# Patient Record
Sex: Female | Born: 1985 | Race: Black or African American | Hispanic: No | Marital: Single | Smoking: Former smoker
Health system: Southern US, Community
[De-identification: ages and names within clinical notes are randomized; demographics above are authoritative.]

## PROBLEM LIST (undated history)

## (undated) ENCOUNTER — Inpatient Hospital Stay: Payer: Self-pay

## (undated) DIAGNOSIS — N760 Acute vaginitis: Secondary | ICD-10-CM

## (undated) DIAGNOSIS — B9689 Other specified bacterial agents as the cause of diseases classified elsewhere: Secondary | ICD-10-CM

## (undated) DIAGNOSIS — I10 Essential (primary) hypertension: Secondary | ICD-10-CM

## (undated) DIAGNOSIS — N83209 Unspecified ovarian cyst, unspecified side: Secondary | ICD-10-CM

## (undated) DIAGNOSIS — F32A Depression, unspecified: Secondary | ICD-10-CM

## (undated) DIAGNOSIS — D649 Anemia, unspecified: Secondary | ICD-10-CM

## (undated) DIAGNOSIS — F419 Anxiety disorder, unspecified: Secondary | ICD-10-CM

## (undated) DIAGNOSIS — F329 Major depressive disorder, single episode, unspecified: Secondary | ICD-10-CM

## (undated) DIAGNOSIS — O009 Unspecified ectopic pregnancy without intrauterine pregnancy: Secondary | ICD-10-CM

## (undated) DIAGNOSIS — Z87440 Personal history of urinary (tract) infections: Secondary | ICD-10-CM

## (undated) HISTORY — DX: Personal history of urinary (tract) infections: Z87.440

## (undated) HISTORY — DX: Unspecified ectopic pregnancy without intrauterine pregnancy: O00.90

## (undated) HISTORY — PX: DILATION AND CURETTAGE OF UTERUS: SHX78

## (undated) HISTORY — DX: Anemia, unspecified: D64.9

---
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
## 2003-09-22 ENCOUNTER — Emergency Department (HOSPITAL_COMMUNITY): Admission: EM | Admit: 2003-09-22 | Discharge: 2003-09-22 | Payer: Self-pay | Admitting: *Deleted

## 2004-02-01 ENCOUNTER — Emergency Department (HOSPITAL_COMMUNITY): Admission: EM | Admit: 2004-02-01 | Discharge: 2004-02-01 | Payer: Self-pay | Admitting: Emergency Medicine

## 2004-06-06 ENCOUNTER — Inpatient Hospital Stay (HOSPITAL_COMMUNITY): Admission: AD | Admit: 2004-06-06 | Discharge: 2004-06-06 | Payer: Self-pay | Admitting: Family Medicine

## 2004-06-08 ENCOUNTER — Encounter (INDEPENDENT_AMBULATORY_CARE_PROVIDER_SITE_OTHER): Payer: Self-pay | Admitting: *Deleted

## 2004-06-08 ENCOUNTER — Ambulatory Visit (HOSPITAL_COMMUNITY): Admission: AD | Admit: 2004-06-08 | Discharge: 2004-06-08 | Payer: Self-pay | Admitting: Obstetrics and Gynecology

## 2004-07-23 ENCOUNTER — Emergency Department (HOSPITAL_COMMUNITY): Admission: EM | Admit: 2004-07-23 | Discharge: 2004-07-23 | Payer: Self-pay | Admitting: Emergency Medicine

## 2004-09-30 ENCOUNTER — Emergency Department (HOSPITAL_COMMUNITY): Admission: EM | Admit: 2004-09-30 | Discharge: 2004-09-30 | Payer: Self-pay | Admitting: Emergency Medicine

## 2004-10-23 ENCOUNTER — Emergency Department (HOSPITAL_COMMUNITY): Admission: EM | Admit: 2004-10-23 | Discharge: 2004-10-23 | Payer: Self-pay | Admitting: *Deleted

## 2004-10-23 ENCOUNTER — Emergency Department: Payer: Self-pay | Admitting: Emergency Medicine

## 2004-10-24 ENCOUNTER — Ambulatory Visit: Payer: Self-pay | Admitting: Emergency Medicine

## 2004-11-28 ENCOUNTER — Inpatient Hospital Stay (HOSPITAL_COMMUNITY): Admission: AD | Admit: 2004-11-28 | Discharge: 2004-11-28 | Payer: Self-pay | Admitting: Obstetrics and Gynecology

## 2004-12-17 ENCOUNTER — Ambulatory Visit (HOSPITAL_COMMUNITY): Admission: RE | Admit: 2004-12-17 | Discharge: 2004-12-17 | Payer: Self-pay | Admitting: *Deleted

## 2005-01-04 ENCOUNTER — Inpatient Hospital Stay (HOSPITAL_COMMUNITY): Admission: AD | Admit: 2005-01-04 | Discharge: 2005-01-04 | Payer: Self-pay | Admitting: *Deleted

## 2005-02-12 ENCOUNTER — Inpatient Hospital Stay (HOSPITAL_COMMUNITY): Admission: AD | Admit: 2005-02-12 | Discharge: 2005-02-12 | Payer: Self-pay | Admitting: *Deleted

## 2005-04-10 ENCOUNTER — Inpatient Hospital Stay (HOSPITAL_COMMUNITY): Admission: AD | Admit: 2005-04-10 | Discharge: 2005-04-11 | Payer: Self-pay | Admitting: *Deleted

## 2005-04-10 ENCOUNTER — Ambulatory Visit: Payer: Self-pay | Admitting: Family Medicine

## 2005-04-13 ENCOUNTER — Inpatient Hospital Stay (HOSPITAL_COMMUNITY): Admission: AD | Admit: 2005-04-13 | Discharge: 2005-04-13 | Payer: Self-pay | Admitting: *Deleted

## 2005-04-13 ENCOUNTER — Ambulatory Visit: Payer: Self-pay | Admitting: Family Medicine

## 2005-04-20 ENCOUNTER — Ambulatory Visit: Payer: Self-pay | Admitting: Obstetrics & Gynecology

## 2005-04-21 ENCOUNTER — Ambulatory Visit: Payer: Self-pay | Admitting: Obstetrics and Gynecology

## 2005-04-22 ENCOUNTER — Inpatient Hospital Stay (HOSPITAL_COMMUNITY): Admission: AD | Admit: 2005-04-22 | Discharge: 2005-04-22 | Payer: Self-pay | Admitting: Gynecology

## 2005-04-22 ENCOUNTER — Ambulatory Visit: Payer: Self-pay | Admitting: Obstetrics and Gynecology

## 2005-04-30 ENCOUNTER — Ambulatory Visit: Payer: Self-pay | Admitting: *Deleted

## 2005-05-01 ENCOUNTER — Ambulatory Visit (HOSPITAL_COMMUNITY): Admission: RE | Admit: 2005-05-01 | Discharge: 2005-05-01 | Payer: Self-pay | Admitting: Gynecology

## 2005-05-02 ENCOUNTER — Ambulatory Visit: Payer: Self-pay | Admitting: Certified Nurse Midwife

## 2005-05-02 ENCOUNTER — Inpatient Hospital Stay (HOSPITAL_COMMUNITY): Admission: AD | Admit: 2005-05-02 | Discharge: 2005-05-02 | Payer: Self-pay | Admitting: Obstetrics and Gynecology

## 2005-05-04 ENCOUNTER — Inpatient Hospital Stay (HOSPITAL_COMMUNITY): Admission: AD | Admit: 2005-05-04 | Discharge: 2005-05-07 | Payer: Self-pay | Admitting: *Deleted

## 2005-05-04 ENCOUNTER — Ambulatory Visit: Payer: Self-pay | Admitting: Certified Nurse Midwife

## 2009-01-02 ENCOUNTER — Inpatient Hospital Stay (HOSPITAL_COMMUNITY): Admission: AD | Admit: 2009-01-02 | Discharge: 2009-01-02 | Payer: Self-pay | Admitting: Obstetrics and Gynecology

## 2009-04-11 ENCOUNTER — Encounter: Payer: Self-pay | Admitting: Obstetrics and Gynecology

## 2009-04-30 ENCOUNTER — Encounter: Payer: Self-pay | Admitting: Pediatric Cardiology

## 2009-06-24 ENCOUNTER — Observation Stay: Payer: Self-pay

## 2009-07-16 ENCOUNTER — Observation Stay: Payer: Self-pay | Admitting: Obstetrics and Gynecology

## 2009-08-03 ENCOUNTER — Inpatient Hospital Stay (HOSPITAL_COMMUNITY): Admission: AD | Admit: 2009-08-03 | Discharge: 2009-08-03 | Payer: Self-pay | Admitting: Family Medicine

## 2009-08-03 ENCOUNTER — Ambulatory Visit: Payer: Self-pay | Admitting: Advanced Practice Midwife

## 2009-08-05 ENCOUNTER — Inpatient Hospital Stay (HOSPITAL_COMMUNITY): Admission: AD | Admit: 2009-08-05 | Discharge: 2009-08-07 | Payer: Self-pay | Admitting: Obstetrics and Gynecology

## 2009-08-05 ENCOUNTER — Ambulatory Visit: Payer: Self-pay | Admitting: Advanced Practice Midwife

## 2010-02-09 ENCOUNTER — Encounter: Payer: Self-pay | Admitting: Family Medicine

## 2010-04-05 LAB — RAPID URINE DRUG SCREEN, HOSP PERFORMED
Amphetamines: NOT DETECTED
Barbiturates: NOT DETECTED
Benzodiazepines: NOT DETECTED
Cocaine: NOT DETECTED
Opiates: NOT DETECTED
Tetrahydrocannabinol: NOT DETECTED

## 2010-04-05 LAB — RUBELLA SCREEN: Rubella: 49.5 IU/mL — ABNORMAL HIGH

## 2010-04-05 LAB — CBC
HCT: 26.8 % — ABNORMAL LOW (ref 36.0–46.0)
HCT: 33 % — ABNORMAL LOW (ref 36.0–46.0)
Hemoglobin: 11.4 g/dL — ABNORMAL LOW (ref 12.0–15.0)
Hemoglobin: 9.2 g/dL — ABNORMAL LOW (ref 12.0–15.0)
MCH: 32.5 pg (ref 26.0–34.0)
MCH: 32.7 pg (ref 26.0–34.0)
MCHC: 34.4 g/dL (ref 30.0–36.0)
MCHC: 34.4 g/dL (ref 30.0–36.0)
MCV: 94.7 fL (ref 78.0–100.0)
MCV: 95.2 fL (ref 78.0–100.0)
Platelets: 186 10*3/uL (ref 150–400)
Platelets: 213 10*3/uL (ref 150–400)
RBC: 2.82 MIL/uL — ABNORMAL LOW (ref 3.87–5.11)
RBC: 3.49 MIL/uL — ABNORMAL LOW (ref 3.87–5.11)
RDW: 13.6 % (ref 11.5–15.5)
RDW: 13.9 % (ref 11.5–15.5)
WBC: 14.7 10*3/uL — ABNORMAL HIGH (ref 4.0–10.5)
WBC: 15.6 10*3/uL — ABNORMAL HIGH (ref 4.0–10.5)

## 2010-04-05 LAB — URINALYSIS, ROUTINE W REFLEX MICROSCOPIC
Bilirubin Urine: NEGATIVE
Glucose, UA: NEGATIVE mg/dL
Hgb urine dipstick: NEGATIVE
Ketones, ur: NEGATIVE mg/dL
Nitrite: NEGATIVE
Protein, ur: NEGATIVE mg/dL
Specific Gravity, Urine: 1.015 (ref 1.005–1.030)
Urobilinogen, UA: 0.2 mg/dL (ref 0.0–1.0)
pH: 7.5 (ref 5.0–8.0)

## 2010-04-05 LAB — DIFFERENTIAL
Basophils Absolute: 0 10*3/uL (ref 0.0–0.1)
Basophils Relative: 0 % (ref 0–1)
Eosinophils Absolute: 0.1 10*3/uL (ref 0.0–0.7)
Eosinophils Relative: 1 % (ref 0–5)
Lymphocytes Relative: 11 % — ABNORMAL LOW (ref 12–46)
Lymphs Abs: 1.8 10*3/uL (ref 0.7–4.0)
Monocytes Absolute: 0.7 10*3/uL (ref 0.1–1.0)
Monocytes Relative: 4 % (ref 3–12)
Neutro Abs: 13 10*3/uL — ABNORMAL HIGH (ref 1.7–7.7)
Neutrophils Relative %: 83 % — ABNORMAL HIGH (ref 43–77)

## 2010-04-05 LAB — HEPATITIS B SURFACE ANTIGEN: Hepatitis B Surface Ag: NEGATIVE

## 2010-04-05 LAB — RPR: RPR Ser Ql: NONREACTIVE

## 2010-04-05 LAB — RAPID HIV SCREEN (WH-MAU): Rapid HIV Screen: NONREACTIVE

## 2010-04-22 LAB — GC/CHLAMYDIA PROBE AMP, GENITAL
Chlamydia, DNA Probe: NEGATIVE
GC Probe Amp, Genital: NEGATIVE

## 2010-04-22 LAB — CBC
HCT: 34.1 % — ABNORMAL LOW (ref 36.0–46.0)
Hemoglobin: 11.5 g/dL — ABNORMAL LOW (ref 12.0–15.0)
MCHC: 33.8 g/dL (ref 30.0–36.0)
MCV: 92.4 fL (ref 78.0–100.0)
Platelets: 275 10*3/uL (ref 150–400)
RBC: 3.69 MIL/uL — ABNORMAL LOW (ref 3.87–5.11)
RDW: 12.7 % (ref 11.5–15.5)
WBC: 14.3 10*3/uL — ABNORMAL HIGH (ref 4.0–10.5)

## 2010-04-22 LAB — URINALYSIS, ROUTINE W REFLEX MICROSCOPIC
Bilirubin Urine: NEGATIVE
Glucose, UA: NEGATIVE mg/dL
Hgb urine dipstick: NEGATIVE
Ketones, ur: NEGATIVE mg/dL
Nitrite: NEGATIVE
Protein, ur: NEGATIVE mg/dL
Specific Gravity, Urine: 1.025 (ref 1.005–1.030)
Urobilinogen, UA: 0.2 mg/dL (ref 0.0–1.0)
pH: 6.5 (ref 5.0–8.0)

## 2010-04-22 LAB — WET PREP, GENITAL
Trich, Wet Prep: NONE SEEN
Yeast Wet Prep HPF POC: NONE SEEN

## 2010-04-22 LAB — POCT PREGNANCY, URINE: Preg Test, Ur: POSITIVE

## 2010-04-22 LAB — ABO/RH: ABO/RH(D): O POS

## 2010-06-06 NOTE — Discharge Summary (Signed)
NAME:  Diamond Sutton, Diamond Sutton NO.:  [id number]   MEDICAL RECORD NO.:  [id number]          PATIENT TYPE:  INP   LOCATION:  9172                          [facility]ILITY:  [facility]   PHYSICIAN:  Lesly Dukes, M.D. DATE OF BIRTH:  1985/02/27   DATE OF ADMISSION:  04/10/2005  DATE OF DISCHARGE:  04/11/2005                                 DISCHARGE SUMMARY   ADMITTING DIAGNOSES:  1.  Preterm cervical change.  2.  32 5/7 week intrauterine pregnancy.   DISCHARGE DIAGNOSES:  1.  Preterm cervical change.  2.  32 5/7 week intrauterine pregnancy.   ADMITTING HISTORY AND PHYSICAL:  The patient is 25 year old G3, P1, 0-1-1 at  8 5/7, who states that she is having very strong pain in her low stomach.  She states that she is having contractions every 5-10 minutes.   OBSTETRIC HISTORY:  1.  G1 2004, 6 pounds 13 ounces, __________.  2.  G2 2006, 12 weeks spontaenous AB, D&C.   GYNECOLOGIC HISTORY:  1.  Abnormal Pap, LG SIL, colposcopy, CIN I.  2.  Right ovarian cyst.   MEDICAL HISTORY:  None.   SURGICAL HISTORY:  D&C.   SOCIAL HISTORY:  She is single.   PRENATAL LABORATORY:  O positive, antibody negative.  Hemoglobin 11.5,  platelets 310, rubella immune.  Hepatitis B negative.  Syphilis negative.  HIV negative.  GC/Chlamydia negative.  GBS negative on February 20, 2005.  One hour Glucola 132.   PHYSICAL EXAMINATION:  GENERAL:  Afebrile, vital signs stable.  Well-  developed, well-nourished female, in no acute distress.  HEART:  Regular rhythm and rate, no murmurs, gallops or rubs.  CHEST:  Clear to auscultation.  ABDOMEN:  Gravid.  PELVIC:  Digital cervical exam by Dr. Gavin Potters showed fingertip, soft, thin,  lower uterine segment.   HOSPITAL COURSE:  The patient was admitted.  She was given betamethasone x2.  She remained stable without contractions.  She did state that she had  pressure.  She was reexamined on the day of admission and found to be  unchanged and found to be  cephalic. On admission, she was transverse and  cephalic on the day of discharge was confirmed by an informal ultrasound.   SIGNIFICANT LABORATORY:  EUA negative.  Fetal fibronectin negative.  GC/chlamydia negative.  GBS pending.  However, the patient had a negative  result on February 20, 2005.   Ultrasound showed an estimated gestational age of 3 2/7 weeks, just over  the 95th percentile for growth.  Normal AFI.  Cervix was 2.6 cm in length.   DISCHARGE INSTRUCTIONS:  The patient was discharged to home in stable  condition.  She was to follow up at high risk clinic.  She will be called  with an appointment.  Her medications included Procardia XL 30 mg 1 p.o.  b.i.d.  She was instructed to stay on bedrest with bathroom privileges.     ______________________________  August Saucer Merlene Morse, MD    ______________________________  Lesly Dukes, M.D.    ABC/MEDQ  D:  04/11/2005  T:  04/13/2005  Job:  [id number]

## 2010-06-06 NOTE — Op Note (Signed)
NAME:  Diamond Sutton, Diamond Sutton NO.:  [id number]   MEDICAL RECORD NO.:  [id number]          PATIENT TYPE:  MAT   LOCATION:  MATC                          [facility]  [facility]   PHYSICIAN:  Malva Limes, M.D.    DATE OF BIRTH:  [birth year]   DATE OF PROCEDURE:  06/08/2004  DATE OF DISCHARGE:                                 OPERATIVE REPORT   PREOPERATIVE DIAGNOSIS:  Spontaneous abortion.   POSTOPERATIVE DIAGNOSIS:  Spontaneous abortion.   PROCEDURE:  Dilatation and curettage.   SURGEON:  Malva Limes, M.D.   ANESTHESIA:  MAC with paracervical block.   ESTIMATED BLOOD LOSS:  Minimal.   COMPLICATIONS:  None.   SPECIMENS:  Products of conception sent to pathology.   DRAINS:  None.   ANTIBIOTICS:  Ancef 1 gram.   DESCRIPTION OF PROCEDURE:  The patient was taken to the operating room where  she was placed in the dorsal lithotomy position.  The MAC anesthesia was  administered without complications.  Betadine was used for prep.  She was  draped in the usual fashion for the procedure.  Examination revealed an  anteverted uterus consistent with 7 weeks.  Sterile speculum was placed in  the vagina.  10 mL of 1% lidocaine was used for a paracervical block.  A  single tooth tenaculum was applied to the anterior cervical lip.  The  cervical os was dilated to a 27 [place].  The 7 mm suction cannula was placed  into the uterine cavity.  Suction curettage was performed followed by sharp  curettage.  A repeat suction was performed after this.  The patient  tolerated the procedure well.  She was discharged to home.  She was  instructed to follow up in the office in 4 weeks.  She was sent home with  Darvocet to take p.r.n. and Keflex 500 mg q.i.d. for 2 days.  The patient  will get RhoGAM if Rh negative.  This case was done as a good Environmental consultant.  She will not be getting a charge from my office as I do not accept Medicaid.      MA/MEDQ  D:  06/08/2004  T:  06/09/2004  Job:  [id number]

## 2010-06-06 NOTE — Discharge Summary (Signed)
NAME:  Diamond Sutton, Diamond Sutton NO.:  [id number]   MEDICAL RECORD NO.:  [id number]          PATIENT TYPE:  INP   LOCATION:                                [facility]   PHYSICIAN:  Angie B. Merlene Morse, MD  DATE OF BIRTH:  1985-05-17   DATE OF ADMISSION:  04/10/2005  DATE OF DISCHARGE:  04/11/2005                                 DISCHARGE SUMMARY   ADDENDUM:  This is an addendum to discharge summary [phone].   ADDITIONAL DISCHARGE DIAGNOSES:  1.  Large for gestational age fetus.  2.  Elevated random blood sugar.   ADDITION TO HOSPITAL COURSE:  The patient was found to have a random blood  sugar of 154.  She states that she had just drank ginger ale.   ADDITION TO DISCHARGE PLAN:  The patient is to followup, was given  instructions on diabetic diet, told to follow up at high risk clinic and  will have an appointment scheduled in the high risk clinic on a Monday when  she can see the diabetic educator.  The patient will need a followup  ultrasound in 3 weeks to evaluate the growth.           ______________________________  [name] B. Merlene Morse, MD     ABC/MEDQ  D:  04/11/2005  T:  04/13/2005  Job:  [id number]

## 2011-08-01 ENCOUNTER — Emergency Department: Payer: Self-pay | Admitting: Unknown Physician Specialty

## 2011-08-01 LAB — COMPREHENSIVE METABOLIC PANEL
Alkaline Phosphatase: 61 U/L (ref 50–136)
Anion Gap: 7 (ref 7–16)
BUN: 15 mg/dL (ref 7–18)
Bilirubin,Total: 0.2 mg/dL (ref 0.2–1.0)
Chloride: 105 mmol/L (ref 98–107)
Co2: 28 mmol/L (ref 21–32)
Creatinine: 0.78 mg/dL (ref 0.60–1.30)
EGFR (African American): >60
EGFR (Non-African Amer.): >60
Glucose: 108 mg/dL — ABNORMAL HIGH (ref 65–99)
Osmolality: 281 (ref 275–301)
Potassium: 3.3 mmol/L — ABNORMAL LOW (ref 3.5–5.1)
SGPT (ALT): 25 U/L
Total Protein: 7.4 g/dL (ref 6.4–8.2)

## 2011-08-01 LAB — CBC
HCT: 36.4 % (ref 35.0–47.0)
HGB: 12 g/dL (ref 12.0–16.0)
MCH: 30.5 pg (ref 26.0–34.0)
MCHC: 33 g/dL (ref 32.0–36.0)
MCV: 92 fL (ref 80–100)
RDW: 13.4 % (ref 11.5–14.5)

## 2011-08-02 LAB — TROPONIN I: Troponin-I: 0.03 ng/mL

## 2011-08-02 LAB — CK TOTAL AND CKMB (NOT AT ARMC): CK-MB: 1.4 ng/mL (ref 0.5–3.6)

## 2011-09-03 ENCOUNTER — Emergency Department: Payer: Self-pay | Admitting: *Deleted

## 2011-12-18 ENCOUNTER — Emergency Department: Payer: Self-pay

## 2011-12-18 LAB — COMPREHENSIVE METABOLIC PANEL
Alkaline Phosphatase: 71 U/L (ref 50–136)
Anion Gap: 5 — ABNORMAL LOW (ref 7–16)
Bilirubin,Total: 0.4 mg/dL (ref 0.2–1.0)
Chloride: 108 mmol/L — ABNORMAL HIGH (ref 98–107)
Co2: 27 mmol/L (ref 21–32)
Creatinine: 0.62 mg/dL (ref 0.60–1.30)
EGFR (Non-African Amer.): >60
Osmolality: 278 (ref 275–301)
Potassium: 3.6 mmol/L (ref 3.5–5.1)
Sodium: 140 mmol/L (ref 136–145)

## 2011-12-18 LAB — URINALYSIS, COMPLETE
Bilirubin,UR: NEGATIVE
Ketone: NEGATIVE
Ph: 5 (ref 4.5–8.0)
Specific Gravity: 1.025 (ref 1.003–1.030)
Squamous Epithelial: 9

## 2011-12-18 LAB — CBC
MCHC: 33.1 g/dL (ref 32.0–36.0)
MCV: 92 fL (ref 80–100)
RDW: 13.7 % (ref 11.5–14.5)

## 2011-12-28 ENCOUNTER — Emergency Department: Payer: Self-pay | Admitting: Emergency Medicine

## 2011-12-28 LAB — URINALYSIS, COMPLETE
Bilirubin,UR: NEGATIVE
Nitrite: POSITIVE
Ph: 6 (ref 4.5–8.0)
Protein: 100
RBC,UR: 18 /HPF (ref 0–5)
Squamous Epithelial: 14

## 2011-12-28 LAB — COMPREHENSIVE METABOLIC PANEL
Alkaline Phosphatase: 78 U/L (ref 50–136)
Anion Gap: 7 (ref 7–16)
Bilirubin,Total: 0.5 mg/dL (ref 0.2–1.0)
EGFR (Non-African Amer.): >60
SGOT(AST): 64 U/L — ABNORMAL HIGH (ref 15–37)
SGPT (ALT): 81 U/L — ABNORMAL HIGH (ref 12–78)
Sodium: 139 mmol/L (ref 136–145)
Total Protein: 7.4 g/dL (ref 6.4–8.2)

## 2011-12-28 LAB — CBC
HCT: 36 % (ref 35.0–47.0)
HGB: 11.9 g/dL — ABNORMAL LOW (ref 12.0–16.0)
MCH: 30.6 pg (ref 26.0–34.0)
RBC: 3.9 10*6/uL (ref 3.80–5.20)
WBC: 15.2 10*3/uL — ABNORMAL HIGH (ref 3.6–11.0)

## 2012-02-19 ENCOUNTER — Emergency Department: Payer: Self-pay | Admitting: Emergency Medicine

## 2012-02-19 LAB — CBC
MCH: 29.7 pg (ref 26.0–34.0)
MCHC: 32.7 g/dL (ref 32.0–36.0)
MCV: 91 fL (ref 80–100)
Platelet: 312 10*3/uL (ref 150–440)
RBC: 4.14 10*6/uL (ref 3.80–5.20)
WBC: 14.6 10*3/uL — ABNORMAL HIGH (ref 3.6–11.0)

## 2012-02-19 LAB — URINALYSIS, COMPLETE
Bilirubin,UR: NEGATIVE
Glucose,UR: NEGATIVE mg/dL (ref 0–75)
Ketone: NEGATIVE
Leukocyte Esterase: NEGATIVE
Ph: 6 (ref 4.5–8.0)
Protein: NEGATIVE
RBC,UR: <1 /HPF (ref 0–5)
Specific Gravity: 1.016 (ref 1.003–1.030)
WBC UR: 2 /HPF (ref 0–5)

## 2012-02-19 LAB — HCG, QUANTITATIVE, PREGNANCY: Beta Hcg, Quant.: 75030 m[IU]/mL — ABNORMAL HIGH

## 2012-04-06 ENCOUNTER — Emergency Department: Payer: Self-pay | Admitting: Emergency Medicine

## 2012-04-06 LAB — URINALYSIS, COMPLETE
Bilirubin,UR: NEGATIVE
Blood: NEGATIVE
Glucose,UR: NEGATIVE mg/dL (ref 0–75)
Ketone: NEGATIVE
Leukocyte Esterase: NEGATIVE
Nitrite: NEGATIVE
Squamous Epithelial: 13
WBC UR: 1 /HPF (ref 0–5)

## 2012-04-06 LAB — CBC
HCT: 36.5 % (ref 35.0–47.0)
HGB: 12.4 g/dL (ref 12.0–16.0)
MCH: 30.5 pg (ref 26.0–34.0)
MCHC: 34.1 g/dL (ref 32.0–36.0)
MCV: 89 fL (ref 80–100)
Platelet: 267 10*3/uL (ref 150–440)
RBC: 4.08 10*6/uL (ref 3.80–5.20)
RDW: 13.4 % (ref 11.5–14.5)
WBC: 14.8 10*3/uL — ABNORMAL HIGH (ref 3.6–11.0)

## 2012-04-06 LAB — COMPREHENSIVE METABOLIC PANEL
Alkaline Phosphatase: 71 U/L (ref 50–136)
Calcium, Total: 8.4 mg/dL — ABNORMAL LOW (ref 8.5–10.1)
Chloride: 107 mmol/L (ref 98–107)
Co2: 25 mmol/L (ref 21–32)
EGFR (African American): >60
Glucose: 104 mg/dL — ABNORMAL HIGH (ref 65–99)
Sodium: 139 mmol/L (ref 136–145)
Total Protein: 7.6 g/dL (ref 6.4–8.2)

## 2012-06-28 ENCOUNTER — Observation Stay: Payer: Self-pay | Admitting: Obstetrics and Gynecology

## 2012-07-23 ENCOUNTER — Observation Stay: Payer: Self-pay | Admitting: Obstetrics and Gynecology

## 2012-07-23 LAB — URINALYSIS, COMPLETE
Blood: NEGATIVE
Glucose,UR: NEGATIVE mg/dL (ref 0–75)
Nitrite: NEGATIVE
Ph: 6 (ref 4.5–8.0)
WBC UR: 4 /HPF (ref 0–5)

## 2012-09-15 ENCOUNTER — Inpatient Hospital Stay: Payer: Self-pay | Admitting: Obstetrics and Gynecology

## 2012-09-15 LAB — PIH PROFILE
Anion Gap: 7 (ref 7–16)
Chloride: 110 mmol/L — ABNORMAL HIGH (ref 98–107)
Co2: 22 mmol/L (ref 21–32)
Creatinine: 0.59 mg/dL — ABNORMAL LOW (ref 0.60–1.30)
EGFR (Non-African Amer.): >60
Glucose: 96 mg/dL (ref 65–99)
HGB: 11.3 g/dL — ABNORMAL LOW (ref 12.0–16.0)
MCH: 30.5 pg (ref 26.0–34.0)
Osmolality: 275 (ref 275–301)
Platelet: 192 10*3/uL (ref 150–440)
RBC: 3.7 10*6/uL — ABNORMAL LOW (ref 3.80–5.20)
SGOT(AST): 20 U/L (ref 15–37)
Sodium: 139 mmol/L (ref 136–145)
WBC: 12.2 10*3/uL — ABNORMAL HIGH (ref 3.6–11.0)

## 2012-09-15 LAB — PROTEIN / CREATININE RATIO, URINE
Creatinine, Urine: 30.8 mg/dL (ref 30.0–125.0)
Protein, Random Urine: 16 mg/dL — ABNORMAL HIGH (ref 0–12)
Protein/Creat. Ratio: 519 mg/gCREAT — ABNORMAL HIGH (ref 0–200)

## 2012-09-16 LAB — HEMATOCRIT: HCT: 29.9 % — ABNORMAL LOW (ref 35.0–47.0)

## 2013-03-27 ENCOUNTER — Emergency Department: Payer: Self-pay | Admitting: Emergency Medicine

## 2013-03-27 LAB — URINALYSIS, COMPLETE
Bilirubin,UR: NEGATIVE
Blood: NEGATIVE
Glucose,UR: NEGATIVE mg/dL (ref 0–75)
Ketone: NEGATIVE
Leukocyte Esterase: NEGATIVE
NITRITE: NEGATIVE
PROTEIN: NEGATIVE
Ph: 6 (ref 4.5–8.0)
SPECIFIC GRAVITY: 1.01 (ref 1.003–1.030)

## 2013-03-27 LAB — COMPREHENSIVE METABOLIC PANEL
ALBUMIN: 4.2 g/dL (ref 3.4–5.0)
ALK PHOS: 82 U/L
Anion Gap: 7 (ref 7–16)
BUN: 9 mg/dL (ref 7–18)
Bilirubin,Total: 0.7 mg/dL (ref 0.2–1.0)
Calcium, Total: 9.2 mg/dL (ref 8.5–10.1)
Chloride: 104 mmol/L (ref 98–107)
Co2: 27 mmol/L (ref 21–32)
Creatinine: 0.73 mg/dL (ref 0.60–1.30)
EGFR (Non-African Amer.): >60
Glucose: 92 mg/dL (ref 65–99)
Osmolality: 274 (ref 275–301)
Potassium: 3.6 mmol/L (ref 3.5–5.1)
SGOT(AST): 29 U/L (ref 15–37)
SGPT (ALT): 26 U/L (ref 12–78)
Sodium: 138 mmol/L (ref 136–145)
Total Protein: 8 g/dL (ref 6.4–8.2)

## 2013-03-27 LAB — CBC WITH DIFFERENTIAL/PLATELET
BASOS PCT: 0.8 %
Basophil #: 0.1 10*3/uL (ref 0.0–0.1)
EOS ABS: 0.2 10*3/uL (ref 0.0–0.7)
EOS PCT: 2.2 %
HCT: 38.8 % (ref 35.0–47.0)
HGB: 13.3 g/dL (ref 12.0–16.0)
Lymphocyte #: 2 10*3/uL (ref 1.0–3.6)
Lymphocyte %: 27.7 %
MCH: 30.7 pg (ref 26.0–34.0)
MCHC: 34.3 g/dL (ref 32.0–36.0)
MCV: 90 fL (ref 80–100)
MONO ABS: 0.7 x10 3/mm (ref 0.2–0.9)
Monocyte %: 9.5 %
NEUTROS PCT: 59.8 %
Neutrophil #: 4.4 10*3/uL (ref 1.4–6.5)
Platelet: 294 10*3/uL (ref 150–440)
RBC: 4.34 10*6/uL (ref 3.80–5.20)
RDW: 13.6 % (ref 11.5–14.5)
WBC: 7.4 10*3/uL (ref 3.6–11.0)

## 2013-03-27 LAB — PREGNANCY, URINE: Pregnancy Test, Urine: NEGATIVE m[IU]/mL

## 2013-05-17 LAB — HM PAP SMEAR: HM Pap smear: NORMAL

## 2013-08-27 ENCOUNTER — Emergency Department: Payer: Self-pay | Admitting: Emergency Medicine

## 2014-01-19 NOTE — L&D Delivery Note (Signed)
Obstetrical Delivery Note   Date of Delivery:   12/30/2014 Primary OB:   [facility] Gestational Age/EDD: 5133w1d Antepartum complications: Preterm labor  Delivered By:   Cornelia Copaharlie Bernita Beckstrom, Jr MD  Delivery Type:   spontaneous vaginal delivery. Patient received one dose of ancef and betamethasone prior to delivery Delivery Details:   Patient easily delivered direct OA infant. Delayed cord clamping and cut by father.  Anesthesia:    epidural Intrapartum complications: None GBS:    Positive only one dose of ancef able to be given Laceration:    none Episiotomy:    none Placenta:    Delivered and expressed via active management. Intact: yes. To pathology: yes.  Estimated Blood Loss:  250mL  Baby:    Liveborn female, APGARs 8/9, weight 2740gm  Cornelia Copaharlie Rockwell Zentz, Jr. MD [facility] Pager [phone]

## 2014-03-26 ENCOUNTER — Emergency Department: Payer: Self-pay | Admitting: Emergency Medicine

## 2014-05-29 NOTE — H&P (Signed)
L&D Evaluation:  History Expanded:  HPI 29 yo G5P4004, EDD of 10/03/12 per LMP & 10 wk US, presents at 37 3/7 weeks with c/o SROM at 12:15 this am and regular ctx. Reports fluid was green colored.  [facility] to [facility], h/o HTN without meds, elevated 1 hr with normal 3 hr GTT   Gravida 5   Term 4   PreTerm 0   Abortion 0   Living 4   Blood Type (Maternal) O positive   Group B Strep Results Maternal (Result >5wks must be treated as unknown) negative   Maternal HIV Negative   Maternal Syphilis Ab Nonreactive   Maternal Varicella Immune   Rubella Results (Maternal) immune   Maternal T-Dap Unknown   [facility]   Presents with contractions, leaking fluid   Patient's Medical History Hypertension   Patient's Surgical History none   Medications Pre Natal Vitamins   Allergies PCN   Social History none   Family History Non-Contributory   ROS:  ROS All systems were reviewed.  HEENT, CNS, GI, GU, Respiratory, CV, Renal and Musculoskeletal systems were found to be normal.   Exam:  Vital Signs BP >140/90   Urine Protein not completed   General no apparent distress   Mental Status clear   Chest clear   Heart normal sinus rhythm   Abdomen gravid, tender with contractions   Estimated Fetal Weight Average for gestational age   Back no CVAT   Edema no edema   Reflexes 1+   Pelvic no external lesions, 3/100/-1   Mebranes Ruptured   Description green/meconium   FHT normal rate with no decels, minimal variability after stadol   Ucx regular   Skin dry   Lymph no lymphadenopathy   Impression:  Impression SROM, labor   Plan:  Plan PIH panel   Comments Requesting epidural Reviewed plan of care with meconium    Electronic Signatures: Vella KohlerBrothers, Darryle Dennie K (CNM)  (Signed 28-Aug-14 06:43)  Authored: L&D Evaluation   Last Updated: 28-Aug-14 06:43 by Vella KohlerBrothers, Latandra Loureiro K (CNM)

## 2014-05-29 NOTE — H&P (Signed)
L&D Evaluation:  History Expanded:  HPI 29 yo G5P4004 azt 29 weeks of pregnancy who presents to l and d with vaginal beeding a few days ago and then it stopped and then started again last night. she put a pad on it a when she woke the blood stain was the sixe of a piece of gum. she has had a litlebit of cramoing, but no other illnesses or   Gravida 5   Term 4   PreTerm 0   Abortion 0   Living 4   Group B Strep Results Maternal (Result >5wks must be treated as unknown) unknown/result > 5 weeks ago   Maternal HIV Negative   Maternal Syphilis Ab Nonreactive   Maternal Varicella Unknown   Rubella Results (Maternal) unknown   Maternal T-Dap Unknown   [facility]   Presents with abdominal pain, back pain, vaginal bleeding   Patient's Medical History No Chronic Illness   Patient's Surgical History none   Medications Pre Natal Vitamins   Allergies NKDA   Social History none   Family History Non-Contributory   ROS:  ROS All systems were reviewed.  HEENT, CNS, GI, GU, Respiratory, CV, Renal and Musculoskeletal systems were found to be normal.   Exam:  Vital Signs stable   Urine Protein not completed   General no apparent distress   Mental Status clear   Chest clear   Heart normal sinus rhythm   Abdomen gravid, non-tender   Back no CVAT   Edema no edema   Reflexes 1+   Pelvic no external lesions   Mebranes Intact   FHT normal rate with no decels   Ucx regular   Skin dry   Lymph no lymphadenopathy   Impression:  Impression evaluate for ptl   Plan:  Plan UA   Follow Up Appointment need to schedule   Electronic Signatures: Adria DevonKlett, Haydn Cush (MD)  (Signed 05-Jul-14 15:48)  Authored: L&D Evaluation   Last Updated: 05-Jul-14 15:48 by Adria DevonKlett, Garey Alleva (MD)

## 2014-05-29 NOTE — H&P (Signed)
L&D Evaluation:  History Expanded:  HPI 29 yo G5P4 who is 26b weeks. and has a multitude of complaints. her hip hurts oin the right and comes around the fron and down her anterior thigh. She felt a swollen neck lymph node but denies sore throat or infectious prcess, she feels pressure and she has a discharge that is yellow. she was seen three days ago in the office and mentioned none of these complaints.   Gravida 5   Term 4   PreTerm 0   Abortion 0   Living 4   Blood Type (Maternal) O positive   Maternal HIV Negative   Maternal Syphilis Ab Nonreactive   Maternal Varicella Immune   Rubella Results (Maternal) immune   Maternal T-Dap Unknown   [facility]   Presents with see hpi   Patient's Medical History No Chronic Illness   Patient's Surgical History none   Medications Pre [place]atal Vitamins  supposed to be on HTN meds but she stopped them   Allergies NKDA   Social History none   Current Prenatal Course Notable For CHTN   ROS:  ROS All systems were reviewed.  HEENT, CNS, GI, GU, Respiratory, CV, Renal and Musculoskeletal systems were found to be normal.   Exam:  Vital Signs stable   Urine Protein not completed   General no apparent distress   Mental Status clear   Chest clear   Heart normal sinus rhythm   Abdomen gravid, non-tender   Back no CVAT   Edema no edema   Pelvic no external lesions   Mebranes Intact   FHT normal rate with no decels, 26b weeks non reactgive but reassuring   Ucx absent   Skin dry   Impression:  Impression vaginal discharge, hip pain   Plan:  Plan monitor contractions and for cervical change   Comments wet prep ffn to hold, check cevrix,  flexeril for hip.   Follow Up Appointment need to schedule   Electronic Signatures: Adria DevonKlett, Tayana Shankle (MD)  (Signed 10-Jun-14 21:42)  Authored: L&D Evaluation   Last Updated: 10-Jun-14 21:42 by Adria DevonKlett, Jairus Tonne (MD)

## 2014-06-13 ENCOUNTER — Other Ambulatory Visit: Payer: Self-pay | Admitting: Family Medicine

## 2014-06-13 DIAGNOSIS — Z369 Encounter for antenatal screening, unspecified: Secondary | ICD-10-CM

## 2014-06-25 ENCOUNTER — Ambulatory Visit
Admission: RE | Admit: 2014-06-25 | Discharge: 2014-06-25 | Disposition: A | Discharge status: Home / Self Care | Payer: Medicaid Other | Source: Ambulatory Visit | Attending: Family Medicine | Admitting: Family Medicine

## 2014-06-25 ENCOUNTER — Ambulatory Visit: Payer: Medicaid Other

## 2014-06-25 ENCOUNTER — Ambulatory Visit
Admission: RE | Admit: 2014-06-25 | Discharge: 2014-06-25 | Disposition: A | Discharge status: Home / Self Care | Payer: Medicaid Other | Source: Ambulatory Visit | Attending: Obstetrics & Gynecology | Admitting: Obstetrics & Gynecology

## 2014-06-25 DIAGNOSIS — Z369 Encounter for antenatal screening, unspecified: Secondary | ICD-10-CM

## 2014-06-25 DIAGNOSIS — Z3A09 9 weeks gestation of pregnancy: Secondary | ICD-10-CM | POA: Diagnosis not present

## 2014-06-25 DIAGNOSIS — Z36 Encounter for antenatal screening of mother: Secondary | ICD-10-CM

## 2014-06-25 DIAGNOSIS — N8320 Unspecified ovarian cysts: Secondary | ICD-10-CM | POA: Insufficient documentation

## 2014-06-25 LAB — US OB COMP LESS 14 WKS

## 2014-06-25 NOTE — Progress Notes (Addendum)
Length of Consultation: 30 minute consultation   Diamond Sutton  was referred to [facility] for genetic counseling to review prenatal screening and testing options.  This note summarizes the information we discussed.    First trimester screening, which can include nuchal translucency ultrasound screen and/or first trimester maternal serum marker screening.  The nuchal translucency has approximately an 80% detection rate for Down syndrome and can be positive for other chromosome abnormalities as well as congenital heart defects.  When combined with a maternal serum marker screening, the detection rate is up to 90% for Down syndrome and up to 97% for trisomy 18.     Maternal serum marker screening, a blood test that measures pregnancy proteins, can provide risk assessments for Down syndrome, trisomy 18, and open neural tube defects (spina bifida, anencephaly). Because it does not directly examine the fetus, it cannot positively diagnose or rule out these problems.  Targeted ultrasound uses high frequency sound waves to create an image of the developing fetus.  An ultrasound is often recommended as a routine means of evaluating the pregnancy.  It is also used to screen for fetal anatomy problems (for example, a heart defect) that might be suggestive of a chromosomal or other abnormality.   Should these screening tests indicate an increased concern, then the following diagnostic options would be offered:  The chorionic villus sampling procedure is available for first trimester chromosome analysis.  This involves the withdrawal of a small amount of chorionic villi (tissue from the developing placenta).  Risk of pregnancy loss is estimated to be approximately 1 in 200 to 1 in 100 (0.5 to 1%).  There is approximately a 1% (1 in 100) chance that the CVS chromosome results will be unclear.  Chorionic villi cannot be tested for neural tube defects.     Amniocentesis involves the removal  of a small amount of amniotic fluid from the sac surrounding the fetus with the use of a thin needle inserted through the maternal abdomen and uterus.  Ultrasound guidance is used throughout the procedure.  Fetal cells from amniotic fluid are directly evaluated and > 99.5% of chromosome problems and > 98% of open neural tube defects can be detected. This procedure is generally performed after the 15th week of pregnancy.  The main risks to this procedure include complications leading to miscarriage in less than 1 in 200 cases (0.5%).  As another option for information if the pregnancy is suspected to be an an increased chance for certain chromosome conditions, we also reviewed the availability of cell free fetal DNA testing from maternal blood to determine whether or not the baby may have either Down syndrome, trisomy 46, or trisomy 24.  This test utilizes a maternal blood sample and DNA sequencing technology to isolate circulating cell free fetal DNA from maternal plasma.  The fetal DNA can then be analyzed for DNA sequences that are derived from the three most common chromosomes involved in aneuploidy, chromosomes 13, 18, and 21.  If the overall amount of DNA is greater than the expected level for any of these chromosomes, aneuploidy is suspected.  This testing is commercially available, and is able to provide another means of determining the chance for one of these common chromosome conditions, without requiring an invasive procedure and traditional karyotype analysis.  While this is new technology, the testing does show great promise, and we offered it as an option.  We discussed this option with her in detail.  We explained  that while we do not consider it a replacement for invasive testing and karyotype analysis, a negative result from this testing would be reassuring, though not a guarantee of a normal chromosome complement for the baby.  An abnormal result is certainly suggestive of an abnormal chromosome  complement, though we would still recommend CVS or amniocentesis to confirm any findings from this testing.   Cystic Fibrosis screening was also discussed with the patient. Cystic fibrosis (CF) is one of the most common genetic conditions in persons of Caucasian ancestry.  This condition occurs in approximately 1 in 2,500 Caucasian persons and results in thickened secretions in the lungs, digestive, and reproductive systems.  For a baby to be at risk for having CF, both of the parents must be carriers for this condition.  Approximately 1 in 31 Caucasian persons is a carrier for CF.  Current carrier testing looks for the most common mutations in the gene for CF and can detect approximately 90% of carriers in the Caucasian population.  This means that the carrier screening can greatly reduce, but cannot eliminate, the chance for an individual to have a child with CF.  If an individual is found to be a carrier for CF, then carrier testing would be available for the partner. As part of [place]'s newborn screening profile, all babies born in the state of [place] will have a two-tier screening process.  Specimens are first tested to determine the concentration of immunoreactive trypsinogen (IRT).  The top 5% of specimens with the highest IRT values then undergo DNA testing using a panel of over 40 common CF mutations.   We obtained a detailed family history and pregnancy history.  The family history was reported to be unremarkable for birth defects, mental retardation, recurrent pregnancy loss or known chromosome abnormalities.  Ms. Olshefski reported no complications or medication exposures during the pregnancy.  She reported binge drinking early in the pregnancy before she learned that she was pregnant. Based upon dating from today's ultrasound, she states that she was in her fifth week of pregnancy when she stopped drinking, which means that the majority of the exposure was during the all or none  period and would have a low risk to the pregnancy.  We encouraged her to continue to avoid alcohol in the pregnancy, as alcohol consumption during pregnancy has been associated with a number of birth defects including growth delays, small head size, heart defects, eye anomalies and facial differences as well as learning disabilities and behavioral problems.  The risk of these to occur tends to increase with the amount of alcohol consumed, however, malformations have been seen with as little as two drinks per day.  Because there is no safe amount of alcohol consumption during pregnancy, we suggest women completely avoid alcohol while pregnant.  A level 2 ultrasound and fetal echocardiogram (a detailed ultrasound of the fetal heart after 20 weeks) may help to detect growth delays or birth defects associated with alcohol use.  However, it is important to remember that not all birth defects can be identified prenatally.  After consideration of the options, Ms. Hargens elected to proceed with an ultrasound.  She also desired first trimester screening; however, given the early gestational age it was rescheduled for 3 weeks.  An ultrasound was performed at the time of the visit.  The gestational age was consistent with  9 weeks.  Fetal anatomy could not be assessed due to early gestational age.  Please refer to the ultrasound report  for details of that study.  Ms. Jac CanavanWatlington was encouraged to call with questions or concerns.  We can be contacted at [phone].  I was present for this consult and saw the patient.  Ginette Bradway, [place] A, MD

## 2014-07-13 ENCOUNTER — Other Ambulatory Visit: Payer: Self-pay | Admitting: Advanced Practice Midwife

## 2014-07-13 ENCOUNTER — Ambulatory Visit
Admission: RE | Admit: 2014-07-13 | Discharge: 2014-07-13 | Disposition: A | Discharge status: Home / Self Care | Payer: Medicaid Other | Source: Ambulatory Visit | Attending: Advanced Practice Midwife | Admitting: Advanced Practice Midwife

## 2014-07-13 DIAGNOSIS — Z8632 Personal history of gestational diabetes: Secondary | ICD-10-CM | POA: Insufficient documentation

## 2014-07-13 DIAGNOSIS — IMO0002 Reserved for concepts with insufficient information to code with codable children: Secondary | ICD-10-CM

## 2014-07-13 DIAGNOSIS — F329 Major depressive disorder, single episode, unspecified: Secondary | ICD-10-CM | POA: Diagnosis not present

## 2014-07-13 DIAGNOSIS — O99331 Smoking (tobacco) complicating pregnancy, first trimester: Secondary | ICD-10-CM | POA: Diagnosis not present

## 2014-07-13 DIAGNOSIS — Z36 Encounter for antenatal screening of mother: Secondary | ICD-10-CM | POA: Insufficient documentation

## 2014-07-13 DIAGNOSIS — Z3A11 11 weeks gestation of pregnancy: Secondary | ICD-10-CM | POA: Insufficient documentation

## 2014-07-16 ENCOUNTER — Ambulatory Visit
Admission: RE | Admit: 2014-07-16 | Discharge: 2014-07-16 | Disposition: A | Discharge status: Home / Self Care | Payer: Medicaid Other | Source: Ambulatory Visit | Attending: Obstetrics & Gynecology | Admitting: Obstetrics & Gynecology

## 2014-07-16 DIAGNOSIS — Z3490 Encounter for supervision of normal pregnancy, unspecified, unspecified trimester: Secondary | ICD-10-CM

## 2014-07-16 DIAGNOSIS — Z36 Encounter for antenatal screening of mother: Secondary | ICD-10-CM | POA: Diagnosis not present

## 2014-07-16 HISTORY — DX: Major depressive disorder, single episode, unspecified: F32.9

## 2014-07-16 NOTE — ED Notes (Signed)
First trimester labs drawn.

## 2014-07-19 ENCOUNTER — Telehealth: Payer: Self-pay | Admitting: Genetics

## 2014-07-19 NOTE — Telephone Encounter (Signed)
  [name] elected to undergo First Trimester screening on 07/16/14.  To review, first trimester screening, includes nuchal translucency ultrasound screen and/or first trimester maternal serum marker screening.  The nuchal translucency has approximately an 80% detection rate for Down syndrome and can be positive for other chromosome abnormalities as well as heart defects.  When combined with a maternal serum marker screening, the detection rate is up to 90% for Down syndrome and up to 97% for trisomy 13 and 18.     The results of the First Trimester Nuchal Translucency and Biochemical Screening were within normal range.  The risk for Down syndrome is now estimated to be less than 1 in 10,000.  The risk for Trisomy 13/18 is also estimated to be less than 1 in 6,650.  Should more definitive information be desired, we would offer amniocentesis.  Because we do not yet know the effectiveness of combined first and second trimester screening, we do not recommend a maternal serum screen to assess the chance for chromosome conditions.  However, if screening for neural tube defects is desired, maternal serum screening for AFP only can be performed between 15 and [redacted] weeks gestation.    We have left a message for the patient with "good news" regarding her results on her mobile voicemail and will attempt to contact her again if we do not hear back from her within a few days.

## 2014-08-27 ENCOUNTER — Ambulatory Visit
Admission: RE | Admit: 2014-08-27 | Discharge: 2014-08-27 | Disposition: A | Discharge status: Home / Self Care | Payer: Medicaid Other | Source: Ambulatory Visit | Attending: Obstetrics and Gynecology | Admitting: Obstetrics and Gynecology

## 2014-08-27 DIAGNOSIS — O09892 Supervision of other high risk pregnancies, second trimester: Secondary | ICD-10-CM | POA: Diagnosis not present

## 2014-08-27 DIAGNOSIS — Z3A18 18 weeks gestation of pregnancy: Secondary | ICD-10-CM | POA: Insufficient documentation

## 2014-08-27 LAB — US OB DETAIL + 14 WK

## 2014-11-23 ENCOUNTER — Observation Stay
Admission: EM | Admit: 2014-11-23 | Discharge: 2014-11-23 | Disposition: A | Discharge status: Home / Self Care | Payer: Medicaid Other | Attending: Certified Nurse Midwife | Admitting: Certified Nurse Midwife

## 2014-11-23 ENCOUNTER — Encounter: Payer: Self-pay | Admitting: *Deleted

## 2014-11-23 DIAGNOSIS — Z3A30 30 weeks gestation of pregnancy: Secondary | ICD-10-CM | POA: Diagnosis not present

## 2014-11-23 DIAGNOSIS — Z641 Problems related to multiparity: Secondary | ICD-10-CM

## 2014-11-23 DIAGNOSIS — Z8759 Personal history of other complications of pregnancy, childbirth and the puerperium: Secondary | ICD-10-CM

## 2014-11-23 DIAGNOSIS — O23593 Infection of other part of genital tract in pregnancy, third trimester: Principal | ICD-10-CM | POA: Insufficient documentation

## 2014-11-23 DIAGNOSIS — Z8659 Personal history of other mental and behavioral disorders: Secondary | ICD-10-CM

## 2014-11-23 DIAGNOSIS — B373 Candidiasis of vulva and vagina: Secondary | ICD-10-CM | POA: Diagnosis not present

## 2014-11-23 DIAGNOSIS — O09299 Supervision of pregnancy with other poor reproductive or obstetric history, unspecified trimester: Secondary | ICD-10-CM

## 2014-11-23 DIAGNOSIS — O9989 Other specified diseases and conditions complicating pregnancy, childbirth and the puerperium: Secondary | ICD-10-CM

## 2014-11-23 DIAGNOSIS — O26893 Other specified pregnancy related conditions, third trimester: Secondary | ICD-10-CM | POA: Diagnosis present

## 2014-11-23 DIAGNOSIS — Z8632 Personal history of gestational diabetes: Secondary | ICD-10-CM

## 2014-11-23 DIAGNOSIS — O26853 Spotting complicating pregnancy, third trimester: Secondary | ICD-10-CM | POA: Diagnosis present

## 2014-11-23 LAB — CHLAMYDIA/NGC RT PCR (ARMC ONLY)
Chlamydia Tr: NOT DETECTED
N GONORRHOEAE: NOT DETECTED

## 2014-11-23 MED ORDER — LACTATED RINGERS IV SOLN: 500.0000 mL | INTRAVENOUS | Status: DC | PRN

## 2014-11-23 NOTE — Progress Notes (Signed)
L&D Triage Note    29 year old G7 68P5015 with EDC=01/26/2015 by a 9wk ultrasound presents from [facility] for evaluation of lower abdominal cramping, lower back pain,  and spotting x 2 days. Last IC one month ago. Has been having more frequent stools over the past 2 days, no stools today. Stools are formed, soft,  And contain no blood. Has had nausea with pregnancy, especially over the past two days. Vomited once today, and has been NPO in preparation for a 3 hr GTT. Denies hematuria, dysuria, and vulvar itching.  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 Current medications: Tylenol, PNV, and promethazine.  Exam : BP 126/57 mmHg  Pulse 88  Resp 16  LMP 04/22/2014  General : in NAD, looking at phone messages Heart: RRR with Grade III/VI systolic murmur all areas Abdomen: mild tenderness in suprapubic area, upper abdomen and uterus soft FHR 140s with accelerations to 150s to 160 Toco: 2 contractions seen over the last hour SSE: no lesions externally, inflammed vestibule and introitus Vagina: white curdlike discharge, wet prep positive for hyphae, and negative for Trich and clue cells Cervix: closed/ 60-70%/-2 UA at [facility] was negative Chlamydia/GC done  A: IUP at 30.6 weeks with no evidence of PTL Monilial vulvovaginitis Cramping possibly due to mild acute gastroenteritis/ decreased hydration  P: DC home with instructions to increase fluids and eat a bland diet over the weekend RX for Diflucan 150 mgm q3days x 2 doses. FU at [facility]/ needs to reschedule 3 hr GTT.  Farrel ConnersGUTIERREZ, [name], CNM

## 2014-11-23 NOTE — OB Triage Note (Signed)
Cramping and back pain that started last night. Spotting started 2 days ago when using the bathroom. Positive fetal movement.

## 2014-11-23 NOTE — Discharge Summary (Signed)
Reviewed discharge instructions with patient, including signs of labor, rupture of membranes, vaginal bleeding, decreased fetal movement, and signs of pre-eclampsia. Reviewed importance of follow up with 3 hour glucose tolerance test at [facility].

## 2014-11-23 NOTE — Discharge Instructions (Signed)
Return to ER for worsening contraction pain, bleeding requiring a pad or passing clots, gush of fluid, decreased fetal movement. Persistent headache, blurry vision, upper belly pain

## 2014-12-29 ENCOUNTER — Inpatient Hospital Stay
Admission: EM | Admit: 2014-12-29 | Discharge: 2015-01-01 | DRG: 775 | Disposition: A | Discharge status: Home / Self Care | Payer: Medicaid Other | Attending: Obstetrics and Gynecology | Admitting: Obstetrics and Gynecology

## 2014-12-29 DIAGNOSIS — Z3A36 36 weeks gestation of pregnancy: Secondary | ICD-10-CM

## 2014-12-29 NOTE — OB Triage Note (Addendum)
Diamond Sutton is a 29 yo, G7P5 at 36.[redacted] weeks gestation.  She came to the hospital complaining of contractions since 9pm, occurring q 5-7 min.  She also reports losing her mucus plug and possible rupture of membranes.  Around 8:58, she felt a pop and starting leaking pinking fluid, which she thought may be urine. +FM, +LOF, -HA, -Blurred Visions.  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

## 2014-12-30 ENCOUNTER — Inpatient Hospital Stay: Payer: Medicaid Other | Admitting: Anesthesiology

## 2014-12-30 DIAGNOSIS — Z3A36 36 weeks gestation of pregnancy: Secondary | ICD-10-CM | POA: Diagnosis not present

## 2014-12-30 LAB — CBC
HCT: 32.4 % — ABNORMAL LOW (ref 35.0–47.0)
HEMOGLOBIN: 10.7 g/dL — AB (ref 12.0–16.0)
MCH: 29.7 pg (ref 26.0–34.0)
MCHC: 33 g/dL (ref 32.0–36.0)
MCV: 89.8 fL (ref 80.0–100.0)
PLATELETS: 227 10*3/uL (ref 150–440)
RBC: 3.6 MIL/uL — AB (ref 3.80–5.20)
RDW: 13.4 % (ref 11.5–14.5)
WBC: 12.2 10*3/uL — AB (ref 3.6–11.0)

## 2014-12-30 LAB — URINE DRUG SCREEN, QUALITATIVE (ARMC ONLY)
AMPHETAMINES, UR SCREEN: NOT DETECTED
Barbiturates, Ur Screen: NOT DETECTED
Benzodiazepine, Ur Scrn: NOT DETECTED
CANNABINOID 50 NG, UR ~~LOC~~: NOT DETECTED
COCAINE METABOLITE, UR ~~LOC~~: NOT DETECTED
MDMA (ECSTASY) UR SCREEN: NOT DETECTED
Methadone Scn, Ur: NOT DETECTED
Opiate, Ur Screen: NOT DETECTED
PHENCYCLIDINE (PCP) UR S: NOT DETECTED
TRICYCLIC, UR SCREEN: NOT DETECTED

## 2014-12-30 LAB — ABO/RH: ABO/RH(D): O POS

## 2014-12-30 LAB — CHLAMYDIA/NGC RT PCR (ARMC ONLY)
CHLAMYDIA TR: NOT DETECTED
N gonorrhoeae: NOT DETECTED

## 2014-12-30 LAB — TYPE AND SCREEN
ABO/RH(D): O POS
Antibody Screen: NEGATIVE

## 2014-12-30 MED ORDER — ONDANSETRON HCL 4 MG/2ML IJ SOLN: 4.0000 mg | Freq: Four times a day (QID) | INTRAMUSCULAR | Status: DC | PRN

## 2014-12-30 MED ORDER — WITCH HAZEL-GLYCERIN EX PADS
1.0000 | MEDICATED_PAD | CUTANEOUS | Status: DC | PRN
Start: 2014-12-30 — End: 2015-01-01

## 2014-12-30 MED ORDER — IBUPROFEN 600 MG PO TABS
600.0000 mg | ORAL_TABLET | Freq: Four times a day (QID) | ORAL | Status: DC
  Administered 2014-12-30 – 2015-01-01 (×6): 600 mg via ORAL
  Filled 2014-12-30 (×5): qty 1

## 2014-12-30 MED ORDER — LIDOCAINE-EPINEPHRINE (PF) 1.5 %-1:200000 IJ SOLN
INTRAMUSCULAR | Status: DC | PRN
  Administered 2014-12-30: 3 mL via PERINEURAL

## 2014-12-30 MED ORDER — LANOLIN HYDROUS EX OINT: TOPICAL_OINTMENT | CUTANEOUS | Status: DC | PRN

## 2014-12-30 MED ORDER — CEFAZOLIN SODIUM-DEXTROSE 2-3 GM-% IV SOLR
2.0000 g | Freq: Once | INTRAVENOUS | Status: AC
  Administered 2014-12-30: 2 g via INTRAVENOUS
  Filled 2014-12-30: qty 50

## 2014-12-30 MED ORDER — OXYTOCIN 40 UNITS IN LACTATED RINGERS INFUSION - SIMPLE MED
62.5000 mL/h | INTRAVENOUS | Status: DC | PRN
  Filled 2014-12-30: qty 1000

## 2014-12-30 MED ORDER — NALBUPHINE HCL 10 MG/ML IJ SOLN
5.0000 mg | INTRAMUSCULAR | Status: DC | PRN
  Filled 2014-12-30: qty 0.5

## 2014-12-30 MED ORDER — NALBUPHINE HCL 10 MG/ML IJ SOLN
5.0000 mg | Freq: Once | INTRAMUSCULAR | Status: DC | PRN
  Filled 2014-12-30: qty 0.5

## 2014-12-30 MED ORDER — LACTATED RINGERS IV SOLN
500.0000 mL | INTRAVENOUS | Status: DC | PRN
Start: 2014-12-30 — End: 2014-12-30
  Administered 2014-12-30: 500 mL via INTRAVENOUS

## 2014-12-30 MED ORDER — INFLUENZA VAC SPLIT QUAD 0.5 ML IM SUSY: 0.5000 mL | PREFILLED_SYRINGE | INTRAMUSCULAR | Status: DC

## 2014-12-30 MED ORDER — OXYCODONE-ACETAMINOPHEN 5-325 MG PO TABS
1.0000 | ORAL_TABLET | ORAL | Status: DC | PRN
  Administered 2014-12-30 (×2): 1 via ORAL
  Filled 2014-12-30 (×2): qty 1

## 2014-12-30 MED ORDER — NALOXONE HCL 0.4 MG/ML IJ SOLN: 0.4000 mg | INTRAMUSCULAR | Status: DC | PRN

## 2014-12-30 MED ORDER — CITRIC ACID-SODIUM CITRATE 334-500 MG/5ML PO SOLN: 30.0000 mL | ORAL | Status: DC | PRN

## 2014-12-30 MED ORDER — OXYTOCIN BOLUS FROM INFUSION: 500.0000 mL | INTRAVENOUS | Status: DC

## 2014-12-30 MED ORDER — SIMETHICONE 80 MG PO CHEW
80.0000 mg | CHEWABLE_TABLET | ORAL | Status: DC | PRN
Start: 2014-12-30 — End: 2015-01-01

## 2014-12-30 MED ORDER — CEFAZOLIN SODIUM 1-5 GM-% IV SOLN
1.0000 g | Freq: Three times a day (TID) | INTRAVENOUS | Status: DC
  Filled 2014-12-30 (×2): qty 50

## 2014-12-30 MED ORDER — DIBUCAINE 1 % RE OINT
1.0000 | TOPICAL_OINTMENT | RECTAL | Status: DC | PRN
Start: 2014-12-30 — End: 2015-01-01

## 2014-12-30 MED ORDER — TETANUS-DIPHTH-ACELL PERTUSSIS 5-2.5-18.5 LF-MCG/0.5 IM SUSP
0.5000 mL | Freq: Once | INTRAMUSCULAR | Status: DC
Start: 2014-12-31 — End: 2015-01-01

## 2014-12-30 MED ORDER — FENTANYL CITRATE (PF) 100 MCG/2ML IJ SOLN
50.0000 ug | INTRAMUSCULAR | Status: DC | PRN
  Administered 2014-12-30: 50 ug via INTRAVENOUS
  Filled 2014-12-30: qty 2

## 2014-12-30 MED ORDER — FENTANYL 2.5 MCG/ML W/ROPIVACAINE 0.2% IN NS 100 ML EPIDURAL INFUSION (ARMC-ANES)
EPIDURAL | Status: AC
  Administered 2014-12-30: 10 mL/h via EPIDURAL
  Filled 2014-12-30: qty 100

## 2014-12-30 MED ORDER — ONDANSETRON HCL 4 MG/2ML IJ SOLN: 4.0000 mg | INTRAMUSCULAR | Status: DC | PRN

## 2014-12-30 MED ORDER — DIPHENHYDRAMINE HCL 25 MG PO CAPS
25.0000 mg | ORAL_CAPSULE | Freq: Four times a day (QID) | ORAL | Status: DC | PRN
Start: 2014-12-30 — End: 2015-01-01

## 2014-12-30 MED ORDER — BENZOCAINE-MENTHOL 20-0.5 % EX AERO
1.0000 "application " | INHALATION_SPRAY | CUTANEOUS | Status: DC | PRN
  Filled 2014-12-30: qty 56

## 2014-12-30 MED ORDER — PRENATAL MULTIVITAMIN CH
1.0000 | ORAL_TABLET | Freq: Every day | ORAL | Status: DC
  Administered 2014-12-30 – 2015-01-01 (×3): 1 via ORAL
  Filled 2014-12-30 (×3): qty 1

## 2014-12-30 MED ORDER — IBUPROFEN 600 MG PO TABS
600.0000 mg | ORAL_TABLET | Freq: Four times a day (QID) | ORAL | Status: DC | PRN
  Administered 2014-12-30 – 2015-01-01 (×2): 600 mg via ORAL
  Filled 2014-12-30 (×4): qty 1

## 2014-12-30 MED ORDER — DOCUSATE SODIUM 100 MG PO CAPS
100.0000 mg | ORAL_CAPSULE | Freq: Two times a day (BID) | ORAL | Status: DC
  Administered 2014-12-30 – 2015-01-01 (×4): 100 mg via ORAL
  Filled 2014-12-30 (×4): qty 1

## 2014-12-30 MED ORDER — ACETAMINOPHEN 325 MG PO TABS: 650.0000 mg | ORAL_TABLET | ORAL | Status: DC | PRN

## 2014-12-30 MED ORDER — MEDROXYPROGESTERONE ACETATE 150 MG/ML IM SUSP
150.0000 mg | Freq: Once | INTRAMUSCULAR | Status: AC
Start: 2014-12-30 — End: 2015-01-01
  Administered 2015-01-01: 150 mg via INTRAMUSCULAR
  Filled 2014-12-30: qty 1

## 2014-12-30 MED ORDER — SCOPOLAMINE 1 MG/3DAYS TD PT72: 1.0000 | MEDICATED_PATCH | Freq: Once | TRANSDERMAL | Status: DC

## 2014-12-30 MED ORDER — BETAMETHASONE SOD PHOS & ACET 6 (3-3) MG/ML IJ SUSP
12.0000 mg | Freq: Every day | INTRAMUSCULAR | Status: DC
  Administered 2014-12-30: 12 mg via INTRAMUSCULAR
  Filled 2014-12-30: qty 1

## 2014-12-30 MED ORDER — FENTANYL 2.5 MCG/ML W/ROPIVACAINE 0.2% IN NS 100 ML EPIDURAL INFUSION (ARMC-ANES): 10.0000 mL/h | EPIDURAL | Status: DC

## 2014-12-30 MED ORDER — LIDOCAINE HCL (PF) 1 % IJ SOLN
30.0000 mL | INTRAMUSCULAR | Status: DC | PRN
  Filled 2014-12-30: qty 30

## 2014-12-30 MED ORDER — LACTATED RINGERS IV SOLN
INTRAVENOUS | Status: DC
Start: 2014-12-30 — End: 2014-12-30
  Administered 2014-12-30: 125 mL via INTRAVENOUS

## 2014-12-30 MED ORDER — NALOXONE HCL 2 MG/2ML IJ SOSY
1.0000 ug/kg/h | PREFILLED_SYRINGE | INTRAVENOUS | Status: DC | PRN
  Filled 2014-12-30: qty 2

## 2014-12-30 MED ORDER — OXYTOCIN 40 UNITS IN LACTATED RINGERS INFUSION - SIMPLE MED
1.0000 m[IU]/min | INTRAVENOUS | Status: DC
Start: 2014-12-30 — End: 2015-01-01
  Filled 2014-12-30: qty 1000

## 2014-12-30 MED ORDER — OXYTOCIN 40 UNITS IN LACTATED RINGERS INFUSION - SIMPLE MED
62.5000 mL/h | INTRAVENOUS | Status: DC
  Administered 2014-12-30: 62.5 mL/h via INTRAVENOUS
  Administered 2014-12-30: 999 mL/h via INTRAVENOUS
  Filled 2014-12-30: qty 1000

## 2014-12-30 MED ORDER — DIPHENHYDRAMINE HCL 25 MG PO CAPS: 25.0000 mg | ORAL_CAPSULE | ORAL | Status: DC | PRN

## 2014-12-30 MED ORDER — TERBUTALINE SULFATE 1 MG/ML IJ SOLN: 0.2500 mg | Freq: Once | INTRAMUSCULAR | Status: DC | PRN

## 2014-12-30 MED ORDER — LACTATED RINGERS IV SOLN: INTRAVENOUS | Status: DC

## 2014-12-30 MED ORDER — MEPERIDINE HCL 25 MG/ML IJ SOLN: 6.2500 mg | INTRAMUSCULAR | Status: DC | PRN

## 2014-12-30 MED ORDER — DIPHENHYDRAMINE HCL 50 MG/ML IJ SOLN: 12.5000 mg | INTRAMUSCULAR | Status: DC | PRN

## 2014-12-30 MED ORDER — SODIUM CHLORIDE 0.9 % IJ SOLN
3.0000 mL | INTRAMUSCULAR | Status: DC | PRN
Start: 2014-12-30 — End: 2015-01-01

## 2014-12-30 MED ORDER — ONDANSETRON HCL 4 MG/2ML IJ SOLN: 4.0000 mg | Freq: Three times a day (TID) | INTRAMUSCULAR | Status: DC | PRN

## 2014-12-30 MED ORDER — ONDANSETRON HCL 4 MG PO TABS: 4.0000 mg | ORAL_TABLET | ORAL | Status: DC | PRN

## 2014-12-30 NOTE — Anesthesia Procedure Notes (Signed)
Epidural Patient location during procedure: OB Start time: 12/30/2014 2:58 AM End time: 12/30/2014 3:13 AM  Staffing Performed by: anesthesiologist   Preanesthetic Checklist Completed: patient identified, site marked, surgical consent, pre-op evaluation, timeout performed, IV checked, risks and benefits discussed and monitors and equipment checked  Epidural Patient position: sitting Prep: Betadine Patient monitoring: heart rate, continuous pulse ox and blood pressure Approach: midline Location: L4-L5 Injection technique: LOR saline  Needle:  Needle type: Tuohy  Needle gauge: 18 G Needle length: 9 cm and 9 Needle insertion depth: 7 cm Catheter type: closed end flexible Catheter size: 20 Guage Catheter at skin depth: 8 cm Test dose: negative and 1.5% lidocaine with Epi 1:200 K  Assessment Events: blood not aspirated, injection not painful, no injection resistance, negative IV test and no paresthesia  Additional Notes   Patient tolerated the insertion well without complications.Reason for block:procedure for pain

## 2014-12-30 NOTE — H&P (Signed)
Obstetrics Admission History & Physical  12/30/2014 - 1:15 AM Primary OBGYN: [facility]  Chief Complaint: ?SROM at 2000 yesterday and preterm UCs  History of Present Illness  29 y.o. [id number] @ [redacted]w[redacted]d (Dating: 9wk u/s, [facility] 1/7), with the above CC. Pregnancy complicated by: h/o CT with negative TOC.  [name] states that she had the above CC. Still feeling clear fluid come out and feels UCs about q53m  Review of Systems: her 12 point review of systems is negative or as noted in the History of Present Illness.  PMHx:  Past Medical History  Diagnosis Date  . Depression 2001   PSHx:  Past Surgical History  Procedure Laterality Date  . Dilation and curettage of uterus      SAB   Medications: none  Allergies: is allergic to penicillins. OBHx:  OB History  Gravida Para Term Preterm AB SAB TAB Ectopic Multiple Living  0 1 1 0 0 0 5    # Outcome Date GA Lbr Len/2nd Weight Sex Delivery Anes PTL Lv  7 Current           6 Term 09/11/12 [redacted]w[redacted]d  7 lb 5 oz (3.317 kg) M Vag-Spont   Y  5 Term 08/05/09 [redacted]w[redacted]d  6 lb 14 oz (3.118 kg) F Vag-Spont   Y  4 Term 03/19/07 [redacted]w[redacted]d  7 lb 7 oz (3.374 kg) M Vag-Spont   Y  3 Term 05/05/05 [redacted]w[redacted]d  6 lb 11 oz (3.033 kg) M Vag-Spont   Y  2 SAB 2006 [redacted]w[redacted]d    SAB     1 Term 05/05/02 [redacted]w[redacted]d  6 lb 13 oz (3.09 kg) M Vag-Spont   Y      GYNHx:  History of abnormal pap smears: No. History of STIs: Yes.  Marland Kitchen             FHx: History reviewed. No pertinent family history. Soc Hx:  Social History   Social History  . Marital Status: Single    Spouse Name: N/A  . Number of Children: N/A  . Years of Education: N/A   Occupational History  . Not on file.   Social History Main Topics  . Smoking status: Former Smoker -- 15 years    Quit date: 04/23/2014  . Smokeless tobacco: Never Used  . Alcohol Use: No  . Drug Use: No  . Sexual Activity: Yes   Other Topics Concern  . Not on file   Social History Narrative    Objective    Current Vital  Signs 24h Vital Sign Ranges  T 98.9 F (37.2 C) Temp  Avg: 98.9 F (37.2 C)  Min: 98.9 F (37.2 C)  Max: 98.9 F (37.2 C)  BP 136/87 mmHg BP  Min: 136/87  Max: 136/87  HR (!) 107 Pulse  Avg: 107  Min: 107  Max: 107  RR 16 Resp  Avg: 16  Min: 16  Max: 16  SaO2     No Data Recorded       24 Hour I/O Current Shift I/O  Time Ins Outs       EFM: 125 baseline, +accels, no decels, mod varq  Toco: q5-43m  General: Well nourished, well developed female in no acute distress.  Skin:  Warm and dry.  Cardiovascular: Regular rate and rhythm. Respiratory:  Clear to auscultation bilateral. Normal respiratory effort Abdomen: gravid, nttp Neuro/Psych:  Normal mood and affect.   SSE: grossly ruptured clear fluid SVE: 3/50/-3/sutures Leopolds/EFW: 2900gm,  cephalic  Labs  +fern  Radiology none  Perinatal info  O POS/ Rubella  Immune / Varicella Immune/RPR pending/HIV Negative/HepB Surf Ag Negative/TDaP:no  / Flu shot: no/pap unknown/  Assessment & Plan   29 y.o. [id number] @ 1752w1d with PPROM; pt stable *IUP: category I tracing *PPROM: BMZ course, UDS, UCx, GC/CT ordered. Augment with pitocin prn. Placenta to pathology *GBS: unknow and pt is preterm. States she has type 1 reaction with PCN so will do ancef *Analgesia: no current needs  Cornelia Copaharlie Sumedh Shinsato, Jr. MD [facility] Pager [phone]

## 2014-12-30 NOTE — Discharge Summary (Signed)
Obstetrical Discharge Summary  Date of Admission: 12/29/2014 Date of Discharge: 01/01/2015  Primary OB: [facility]  Gestational Age at Delivery: 6888w1d   Antepartum complications: preterm labor, history of chlamydia Reason for Admission: preterm labor Date of Delivery: 12/30/14  Delivered By: Cornelia Copaharlie Pickens, Jr MD Delivery Type: spontaneous vaginal delivery Intrapartum complications/course: None. She received one dose of ancef and betamethasone prior to delivery Anesthesia: epidural Placenta: expressed. Intact: yes. To pathology: yes.  Laceration: none Episiotomy: none Baby: Liveborn female, APGARs8/9, weight 2740 g.   Postpartum course: uncomplicated following vaginal delivery Discharge Vital Signs:  Patient Vitals for the past 6 hrs:  BP Temp Temp src Pulse Resp SpO2  01/01/15 0816 131/81 mmHg 98.5 F (36.9 C) Oral 73 20 99 %    Discharge Exam:  NAD Perineum: intact Abdomen: firm fundus below the umbilicus.  RRR no MRGs CTAB Ext: no c/c/e  Disposition: Home  Rh Immune globulin given: not applicable Rubella vaccine given: not applicable Tdap vaccine given in AP or PP setting: yes Flu vaccine given in AP or PP setting: yes  Contraception: depo, given at discharge. Future prescriptions to be given at pp visit  Prenatal/Postnatal Panel: O POS//Rubella Immune//Varicella Immune//RPR negative//HIV negative/HepB Surface Ag negative//pap unknown //plans to breastfeed, plans to bottle feed  Plan:  Diamond Sutton was discharged to home in good condition. [facility] in 6 weeks  Discharge Medications:   Medication List    STOP taking these medications        promethazine 25 MG tablet  Commonly known as:  PHENERGAN      TAKE these medications        ibuprofen 600 MG tablet  Commonly known as:  ADVIL,MOTRIN  Take 1 tablet (600 mg total) by mouth every 6 (six) hours as needed for mild pain.     prenatal multivitamin Tabs tablet   Take 1 tablet by mouth daily at 12 noon.        Tresea MallGLEDHILL,Diamond Sutton, CNM   This patient and plan were discussed with Dr Elesa MassedWard 01/01/2015

## 2014-12-30 NOTE — Anesthesia Preprocedure Evaluation (Signed)
Anesthesia Evaluation  Patient identified by MRN, date of birth, ID band Patient awake    Reviewed: Allergy & Precautions, Patient's Chart, lab work & pertinent test results  History of Anesthesia Complications Negative for: history of anesthetic complications  Airway Mallampati: III       Dental   Pulmonary former smoker,           Cardiovascular negative cardio ROS       Neuro/Psych negative neurological ROS     GI/Hepatic negative GI ROS, Neg liver ROS,   Endo/Other  negative endocrine ROS  Renal/GU negative Renal ROS     Musculoskeletal   Abdominal   Peds  Hematology negative hematology ROS (+)   Anesthesia Other Findings   Reproductive/Obstetrics (+) Pregnancy                             Anesthesia Physical Anesthesia Plan  ASA: II  Anesthesia Plan: Epidural   Post-op Pain Management:    Induction:   Airway Management Planned:   Additional Equipment:   Intra-op Plan:   Post-operative Plan:   Informed Consent: I have reviewed the patients History and Physical, chart, labs and discussed the procedure including the risks, benefits and alternatives for the proposed anesthesia with the patient or authorized representative who has indicated his/her understanding and acceptance.     Plan Discussed with:   Anesthesia Plan Comments:         Anesthesia Quick Evaluation

## 2014-12-31 LAB — CULTURE, BETA STREP (GROUP B ONLY)

## 2014-12-31 LAB — URINE CULTURE

## 2014-12-31 LAB — RPR: RPR Ser Ql: NONREACTIVE

## 2014-12-31 LAB — HEMOGLOBIN: Hemoglobin: 9.6 g/dL — ABNORMAL LOW (ref 12.0–16.0)

## 2014-12-31 NOTE — Progress Notes (Signed)
Post Partum Day1 Subjective: no complaints, tolerating PO and voiding without difficulty  Objective: Blood pressure 119/80, pulse 70, temperature 98.5 F (36.9 C), temperature source Oral, resp. rate 18, height 5\' 6"  (1.676 m), weight 73.936 kg (163 lb), last menstrual period 04/22/2014, SpO2 100 %, unknown if currently breastfeeding.  Physical Exam:  General: alert and no distress Lochia: appropriate Uterine Fundus: firm at U-1/ML/NT DVT Evaluation: No evidence of DVT seen on physical exam.   Recent Labs  12/30/14 0141 12/31/14 0941  HGB 10.7* 9.6*  HCT 32.4*  --   WBC 12.2*  --   PLT 227  --     Assessment/Plan: Stable ppd#1 Plan for discharge tomorrow  Breast feeding O POS/ RI/ VI   LOS: 1 day   Diamond Sutton 12/31/2014, 12:18 PM

## 2014-12-31 NOTE — Anesthesia Postprocedure Evaluation (Addendum)
Anesthesia Post Note  Patient: Diamond Sutton  Procedure(s) Performed: * No procedures listed *  Patient location during evaluation: Mother Baby Anesthesia Type: Epidural Level of consciousness: awake, awake and alert, oriented and patient cooperative Pain management: pain level controlled Vital Signs Assessment: post-procedure vital signs reviewed and stable Respiratory status: spontaneous breathing, nonlabored ventilation and respiratory function stable Cardiovascular status: stable Postop Assessment: no headache and no backache Anesthetic complications: no    Last Vitals:  Filed Vitals:   12/31/14 0000 12/31/14 0445  BP: 130/83 102/68  Pulse: 77 66  Temp: 37.1 C 36.8 C  Resp: 18 18    Last Pain:  Filed Vitals:   12/31/14 0517  PainSc: 8                  Zachary GeorgeWeatherly,  Aqueelah Cotrell F

## 2014-12-31 NOTE — Care Management (Signed)
Received a referral concerning needing  a car seat. Spoke with Wilford Gristara Mitchell Clinical Social Worker. States she will assess this request. Gwenette GreetBrenda S Jaxyn Mestas RN MSN CCM  Care Management [phone]

## 2014-12-31 NOTE — Anesthesia Postprocedure Evaluation (Signed)
Anesthesia Post Note  Patient: Diamond Sutton  Procedure(s) Performed: * No procedures listed *  Anesthesia Post Evaluation  Last Vitals:  Filed Vitals:   12/31/14 0000 12/31/14 0445  BP: 130/83 102/68  Pulse: 77 66  Temp: 37.1 C 36.8 C  Resp: 18 18    Last Pain:  Filed Vitals:   12/31/14 0517  PainSc: 8                  Zachary GeorgeWeatherly,  Arely Tinner F

## 2015-01-01 LAB — SURGICAL PATHOLOGY

## 2015-01-01 MED ORDER — IBUPROFEN 600 MG PO TABS: 600.0000 mg | ORAL_TABLET | Freq: Four times a day (QID) | ORAL | Status: DC | PRN

## 2015-01-01 NOTE — Progress Notes (Signed)
Patient understands all discharge instructions and the need to make follow up appointments. Patient discharge via wheelchair with auxillary. 

## 2015-01-01 NOTE — Discharge Instructions (Signed)
°  Discharge instructions:   Call office if you have any of the following: headache, visual changes, fever >101 F, chills, breast concerns, excessive vaginal bleeding, leg pain or redness, depression or any other concerns.   Activity: Do not lift > 20 lbs for 6 weeks.  No intercourse or tampons for 6 weeks.  No driving for 1-2 weeks.   Call your doctor for increased pain or vaginal bleeding, temperature above 101.0, depression, or concerns.  No strenuous activity or heavy lifting for 6 weeks.  No intercourse, tampons, douching, or enemas for 6 weeks.  Tub baths and showers are ok.  No driving for 2 weeks or while taking pain medications.  Continue prenatal vitamin and iron.  Increase calories and fluids while breastfeeding.  For concerns about your baby please call the pediatrician For breastfeeding issues please remember to call our lactation consultants.Bleeding: Your bleeding could continue up to 6 weeks, the flow should gradually decrease and the color should become dark then lightened over the next couple of weeks. If you notice you are bleeding heavily or passing clots larger than the size of your fist, PLEASE call your physician. No TAMPONS, DOUCHING, ENEMAS OR SEXUAL INTERCOURSE for 6 weeks.   Stitches: Shower daily with mild soap and water. Stitches will dissolve over the next couple of weeks, if you experience any discomfort in the vaginal area you may sit in warm water 15-20 minutes, 3-4 times per day. Just enough water to cover vaginal area.   AfterPains: This is the uterus contracting back to its normal position and size. Use medications prescribed or recommended by your physician to help relieve this discomfort.   Bowels/Hemorrhoids: Drink plenty of water and stay active. Increase fiber, fresh fruits and vegetables in your diet.   Rest/Activity: Rest when the baby is resting  Bathing: Shower daily!  Diet: Continue to eat extra calories until your follow up visit to help replenish  nutrients and vitamins. If breastfeeding eat an extra [phone] and increase your fluid intake to 12 glasses a day.   Contraception: Consult with your physician on what method of birth control you would like to use.   Postpartum "BLUES": It is common to emotional days after delivery, however if it persist for greater than 2 weeks or if you feel concerned please let your physician know immediately. This is hormone driven and nothing you can control so please let someone know how you feel.  Follow Up Visit: Please schedule a follow up visit with your physician.

## 2015-10-20 LAB — HM PAP SMEAR: HM Pap smear: NORMAL

## 2016-01-20 NOTE — L&D Delivery Note (Signed)
Delivery Note   Diamond Sutton is a 31 y.o. [id number] at 2564w6d Estimated Date of Delivery: 12/26/16  PRE-OPERATIVE DIAGNOSIS:  1) 7564w6d pregnancy.   POST-OPERATIVE DIAGNOSIS:  1) 2864w6d pregnancy s/p Vaginal, Spontaneous   Delivery Type: Vaginal, Spontaneous    Delivery Anesthesia: None   Labor Complications: Precipitous Delivery     ESTIMATED BLOOD LOSS: 200 ml    FINDINGS:   1) female infant, Apgar scores of 8    at 1 minute and 9    at 5 minutes and a birthweight of 128.4  ounces.    2) Nuchal cord: No SPECIMENS:   PLACENTA:   Appearance: Intact , 3 vessels   Removal: Spontaneous      Disposition:   Held per protocol , then discarded  DISPOSITION:  Infant to left in stable condition in the delivery room, with L&D personnel and mother,  NARRATIVE SUMMARY: Labor course:  [name] is a A5W0981G9P5126 at 4464w6d who presented for labor management.  She progressed well in labor without pitocin.  She received no anesthesia and proceeded to complete dilation. She evidenced good maternal expulsive effort during the second stage. She went on to deliver a viable female infant. The placenta delivered without problems and was noted to be complete. A perineal and vaginal examination was performed. Episiotomy/Lacerations:   None. The patient tolerated this well. PT has signed consent for BTL and would like to proceed. BTL consent was sign on 10/05/16, copy in the chart.   Doreene Burkennie Jordell Outten, CNM 12/11/2016 4:53 AM

## 2016-02-10 ENCOUNTER — Emergency Department
Admission: EM | Admit: 2016-02-10 | Discharge: 2016-02-10 | Disposition: A | Discharge status: Home / Self Care | Payer: Medicaid Other | Attending: Emergency Medicine | Admitting: Emergency Medicine

## 2016-02-10 DIAGNOSIS — Z791 Long term (current) use of non-steroidal anti-inflammatories (NSAID): Secondary | ICD-10-CM | POA: Insufficient documentation

## 2016-02-10 DIAGNOSIS — L259 Unspecified contact dermatitis, unspecified cause: Secondary | ICD-10-CM | POA: Insufficient documentation

## 2016-02-10 DIAGNOSIS — R21 Rash and other nonspecific skin eruption: Secondary | ICD-10-CM

## 2016-02-10 DIAGNOSIS — F329 Major depressive disorder, single episode, unspecified: Secondary | ICD-10-CM | POA: Insufficient documentation

## 2016-02-10 DIAGNOSIS — Z87891 Personal history of nicotine dependence: Secondary | ICD-10-CM | POA: Insufficient documentation

## 2016-02-10 MED ORDER — METHYLPREDNISOLONE 4 MG PO TBPK: ORAL_TABLET | ORAL | 0 refills | Status: DC

## 2016-02-10 MED ORDER — HYDROXYZINE HCL 50 MG PO TABS: 50.0000 mg | ORAL_TABLET | Freq: Three times a day (TID) | ORAL | 0 refills | Status: DC | PRN

## 2016-02-10 NOTE — ED Provider Notes (Signed)
[facility] Emergency Department Provider Note   ____________________________________________   First MD Initiated Contact with Patient 02/10/16 1248     (approximate)  I have reviewed the triage vital signs and the nursing notes.   HISTORY  Chief Complaint Rash    HPI Diamond Sutton is a 31 y.o. female patient complaining of itchy rash on the right side of her face that started 2 days ago. Patient stated the rash is associated itching. Patient believe she is contacted a rash from her pillow. Daughter is here for the same complaint. No palliative measures taken for this complaint.   Past Medical History:  Diagnosis Date  . Depression 2001    Patient Active Problem List   Diagnosis Date Noted  . Postpartum care following vaginal delivery 01/01/2015  . 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 DILATION AND CURETTAGE OF UTERUS     SAB    Prior to Admission medications   Medication Sig Start Date End Date Taking? Authorizing Provider  hydrOXYzine (ATARAX/VISTARIL) 50 MG tablet Take 1 tablet (50 mg total) by mouth 3 (three) times daily as needed. 02/10/16   Joni Reining, PA-C  ibuprofen (ADVIL,MOTRIN) 600 MG tablet Take 1 tablet (600 mg total) by mouth every 6 (six) hours as needed for mild pain. 01/01/15   Tresea Mall, CNM  methylPREDNISolone (MEDROL DOSEPAK) 4 MG TBPK tablet Take Tapered dose as directed 02/10/16   Joni Reining, PA-C  Prenatal Vit-Fe Fumarate-FA (PRENATAL MULTIVITAMIN) TABS tablet Take 1 tablet by mouth daily at 12 noon.    Historical Provider, MD    Allergies Penicillins  No family history on file.  Social History Social History  Substance Use Topics  . Smoking status: Former Smoker    Years: 15.00    Quit date: 04/23/2014  . Smokeless tobacco: Never Used  . Alcohol use No    Review of Systems Constitutional: No fever/chills Eyes: No visual changes. ENT: No sore  throat. Cardiovascular: Denies chest pain. Respiratory: Denies shortness of breath. Gastrointestinal: No abdominal pain.  No nausea, no vomiting.  No diarrhea.  No constipation. Genitourinary: Negative for dysuria. Musculoskeletal: Negative for back pain. Skin: Positive for rash. Neurological: Negative for headaches, focal weakness or numbness. Psychiatric:Depression Allergic/Immunilogical: Penicillin  ____________________________________________   PHYSICAL EXAM:  VITAL SIGNS: ED Triage Vitals  Enc Vitals Group     BP 02/10/16 1228 124/69     Pulse Rate 02/10/16 1228 69     Resp 02/10/16 1228 16     Temp 02/10/16 1228 98 F (36.7 C)     Temp Source 02/10/16 1228 Oral     SpO2 02/10/16 1228 100 %     Weight 02/10/16 1228 145 lb (65.8 kg)     Height 02/10/16 1228 5\' 6"  (1.676 m)     Head Circumference --      Peak Flow --      Pain Score 02/10/16 1232 0     Pain Loc --      Pain Edu? --      Excl. in GC? --     Constitutional: Alert and oriented. Well appearing and in no acute distress. Eyes: Conjunctivae are normal. PERRL. EOMI. Head: Atraumatic. Nose: No congestion/rhinnorhea. Mouth/Throat: Mucous membranes are moist.  Oropharynx non-erythematous. Neck: No stridor.  No cervical spine tenderness to palpation. Hematological/Lymphatic/Immunilogical: No cervical lymphadenopathy. Cardiovascular: Normal rate, regular rhythm. Grossly normal heart sounds.  Good peripheral circulation. Respiratory: Normal  respiratory effort.  No retractions. Lungs CTAB. Gastrointestinal: Soft and nontender. No distention. No abdominal bruits. No CVA tenderness. Musculoskeletal: No lower extremity tenderness nor edema.  No joint effusions. Neurologic:  Normal speech and language. No gross focal neurologic deficits are appreciated. No gait instability. Skin:  Skin is warm, dry and intact. Vesicle papular lesions facial area  Psychiatric: Mood and affect are normal. Speech and behavior are  normal.  ____________________________________________   LABS (all labs ordered are listed, but only abnormal results are displayed)  Labs Reviewed - No data to display ____________________________________________  EKG   ____________________________________________  RADIOLOGY   ____________________________________________   PROCEDURES  Procedure(s) performed: None  Procedures  Critical Care performed: No  ____________________________________________   INITIAL IMPRESSION / ASSESSMENT AND PLAN / ED COURSE  Pertinent labs & imaging results that were available during my care of the patient were reviewed by me and considered in my medical decision making (see chart for details).  Contact dermatitis. Patient given discharge Instructions. Patient prescription for Medrol Dosepak and Atarax. Patient advised [facility] condition persists.      ____________________________________________   FINAL CLINICAL IMPRESSION(S) / ED DIAGNOSES  Final diagnoses:  Rash and nonspecific skin eruption      NEW MEDICATIONS STARTED DURING THIS VISIT:  New Prescriptions   HYDROXYZINE (ATARAX/VISTARIL) 50 MG TABLET    Take 1 tablet (50 mg total) by mouth 3 (three) times daily as needed.   METHYLPREDNISOLONE (MEDROL DOSEPAK) 4 MG TBPK TABLET    Take Tapered dose as directed     Note:  This document was prepared using Dragon voice recognition software and may include unintentional dictation errors.    Joni Reiningonald K Smith, PA-C 02/10/16 1302    Rockne MenghiniAnne-Caroline Norman, MD 02/10/16 [phone]

## 2016-02-10 NOTE — ED Triage Notes (Signed)
Pt noted rash on right side of face that started Saturday - rash is fine red/bumpy rash that is itching

## 2016-02-20 ENCOUNTER — Encounter: Payer: Self-pay | Admitting: Emergency Medicine

## 2016-02-20 ENCOUNTER — Emergency Department: Payer: Medicaid Other

## 2016-02-20 ENCOUNTER — Emergency Department
Admission: EM | Admit: 2016-02-20 | Discharge: 2016-02-20 | Disposition: A | Discharge status: Home / Self Care | Payer: Medicaid Other | Attending: Emergency Medicine | Admitting: Emergency Medicine

## 2016-02-20 DIAGNOSIS — R1032 Left lower quadrant pain: Secondary | ICD-10-CM | POA: Diagnosis not present

## 2016-02-20 DIAGNOSIS — F17210 Nicotine dependence, cigarettes, uncomplicated: Secondary | ICD-10-CM | POA: Insufficient documentation

## 2016-02-20 DIAGNOSIS — R110 Nausea: Secondary | ICD-10-CM | POA: Insufficient documentation

## 2016-02-20 LAB — COMPREHENSIVE METABOLIC PANEL
ALK PHOS: 60 U/L (ref 38–126)
ALT: 24 U/L (ref 14–54)
ANION GAP: 8 (ref 5–15)
AST: 22 U/L (ref 15–41)
Albumin: 4.4 g/dL (ref 3.5–5.0)
BILIRUBIN TOTAL: 0.6 mg/dL (ref 0.3–1.2)
BUN: 11 mg/dL (ref 6–20)
CO2: 25 mmol/L (ref 22–32)
CREATININE: 0.69 mg/dL (ref 0.44–1.00)
Calcium: 8.6 mg/dL — ABNORMAL LOW (ref 8.9–10.3)
Chloride: 107 mmol/L (ref 101–111)
GFR calc non Af Amer: >60 mL/min (ref >60)
GLUCOSE: 96 mg/dL (ref 65–99)
Potassium: 3.7 mmol/L (ref 3.5–5.1)
Sodium: 140 mmol/L (ref 135–145)
TOTAL PROTEIN: 7.4 g/dL (ref 6.5–8.1)

## 2016-02-20 LAB — URINALYSIS, COMPLETE (UACMP) WITH MICROSCOPIC
Bacteria, UA: NONE SEEN
Bilirubin Urine: NEGATIVE
Glucose, UA: NEGATIVE mg/dL
Ketones, ur: NEGATIVE mg/dL
Leukocytes, UA: NEGATIVE
Nitrite: NEGATIVE
Protein, ur: NEGATIVE mg/dL
Specific Gravity, Urine: 1.021 (ref 1.005–1.030)
WBC, UA: NONE SEEN WBC/hpf (ref 0–5)
pH: 5 (ref 5.0–8.0)

## 2016-02-20 LAB — POCT PREGNANCY, URINE: Preg Test, Ur: NEGATIVE

## 2016-02-20 LAB — CBC
HCT: 37.9 % (ref 35.0–47.0)
Hemoglobin: 13.3 g/dL (ref 12.0–16.0)
MCH: 31 pg (ref 26.0–34.0)
MCHC: 35.2 g/dL (ref 32.0–36.0)
MCV: 88.1 fL (ref 80.0–100.0)
Platelets: 325 10*3/uL (ref 150–440)
RBC: 4.3 MIL/uL (ref 3.80–5.20)
RDW: 13.6 % (ref 11.5–14.5)
WBC: 12.8 10*3/uL — ABNORMAL HIGH (ref 3.6–11.0)

## 2016-02-20 LAB — LIPASE, BLOOD: Lipase: 22 U/L (ref 11–51)

## 2016-02-20 MED ORDER — KETOROLAC TROMETHAMINE 30 MG/ML IJ SOLN
30.0000 mg | Freq: Once | INTRAMUSCULAR | Status: AC
  Administered 2016-02-20: 30 mg via INTRAVENOUS
  Filled 2016-02-20: qty 1

## 2016-02-20 MED ORDER — ONDANSETRON HCL 4 MG/2ML IJ SOLN
4.0000 mg | Freq: Once | INTRAMUSCULAR | Status: AC
  Administered 2016-02-20: 4 mg via INTRAVENOUS
  Filled 2016-02-20: qty 2

## 2016-02-20 MED ORDER — SODIUM CHLORIDE 0.9 % IV BOLUS (SEPSIS)
1000.0000 mL | Freq: Once | INTRAVENOUS | Status: AC
  Administered 2016-02-20: 1000 mL via INTRAVENOUS

## 2016-02-20 NOTE — ED Notes (Signed)
Pt in CT.

## 2016-02-20 NOTE — ED Notes (Signed)
NAD. Waiting on disposition. No needs at this time. 

## 2016-02-20 NOTE — ED Triage Notes (Signed)
Pt to ED from home c/o lower abd pain radiating to back.  States pain is stabbing and started this morning woke patient up out of sleep.  Denies n/v/d, denies urinary symptoms.

## 2016-02-20 NOTE — Discharge Instructions (Signed)

## 2016-02-20 NOTE — ED Notes (Signed)
Patient transported to Ultrasound 

## 2016-02-20 NOTE — ED Provider Notes (Signed)
[facility] Emergency Department Provider Note  ____________________________________________  Time seen: Approximately 3:18 PM  I have reviewed the triage vital signs and the nursing notes.   HISTORY  Chief Complaint Abdominal Pain   HPI Diamond Sutton is a 31 y.o. female h/o ovarian cystswho present for evaluation of LLQ abdominal pain. Patient reports that she woke up this morning because of the pain. The pain was severe, sharp, located in the left lower quadrant radiating to her lower back. Currently 7 out of 10. Associated with nausea. No vomiting, no diarrhea, no dysuria, no vaginal bleeding, no vaginal discharge, no hematuria, no chest pain or shortness of breath. Patient denies ever having similar pain. She reports a history of ovarian cysts. Patient denies prior history of STD.  LMP 01/09/16  Past Medical History:  Diagnosis Date  . Depression 2001    Patient Active Problem List   Diagnosis Date Noted  . Postpartum care following vaginal delivery 01/01/2015  . 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 DILATION AND CURETTAGE OF UTERUS     SAB    Prior to Admission medications   Medication Sig Start Date End Date Taking? Authorizing Provider  hydrOXYzine (ATARAX/VISTARIL) 50 MG tablet Take 1 tablet (50 mg total) by mouth 3 (three) times daily as needed. Patient not taking: Reported on 02/20/2016 02/10/16   Joni Reiningonald K Smith, PA-C  ibuprofen (ADVIL,MOTRIN) 600 MG tablet Take 1 tablet (600 mg total) by mouth every 6 (six) hours as needed for mild pain. Patient not taking: Reported on 02/20/2016 01/01/15   Tresea MallJane Gledhill, CNM  methylPREDNISolone (MEDROL DOSEPAK) 4 MG TBPK tablet Take Tapered dose as directed Patient not taking: Reported on 02/20/2016 02/10/16   Joni Reiningonald K Smith, PA-C    Allergies Penicillins  History reviewed. No pertinent family history.  Social History Social History  Substance Use  Topics  . Smoking status: Current Some Day Smoker    Years: 15.00    Types: Cigarettes    Last attempt to quit: 04/23/2014  . Smokeless tobacco: Never Used  . Alcohol use Yes     Comment: drinks on weekends    Review of Systems  Constitutional: Negative for fever. Eyes: Negative for visual changes. ENT: Negative for sore throat. Neck: No neck pain  Cardiovascular: Negative for chest pain. Respiratory: Negative for shortness of breath. Gastrointestinal: + LLQ abdominal pain and nausea. No vomiting or diarrhea. Genitourinary: Negative for dysuria. Musculoskeletal: Negative for back pain. Skin: Negative for rash. Neurological: Negative for headaches, weakness or numbness. Psych: No SI or HI  ____________________________________________   PHYSICAL EXAM:  VITAL SIGNS: ED Triage Vitals  Enc Vitals Group     BP 02/20/16 1309 118/76     Pulse Rate 02/20/16 1309 95     Resp 02/20/16 1309 18     Temp 02/20/16 1309 98.5 F (36.9 C)     Temp Source 02/20/16 1309 Oral     SpO2 02/20/16 1309 100 %     Weight 02/20/16 1240 145 lb (65.8 kg)     Height 02/20/16 1240 5\' 6"  (1.676 m)     Head Circumference --      Peak Flow --      Pain Score 02/20/16 1241 8     Pain Loc --      Pain Edu? --      Excl. in GC? --     Constitutional: Alert and oriented. Well appearing and in  no apparent distress. HEENT:      Head: Normocephalic and atraumatic.         Eyes: Conjunctivae are normal. Sclera is non-icteric. EOMI. PERRL      Mouth/Throat: Mucous membranes are moist.       Neck: Supple with no signs of meningismus. Cardiovascular: Regular rate and rhythm. No murmurs, gallops, or rubs. 2+ symmetrical distal pulses are present in all extremities. No JVD. Respiratory: Normal respiratory effort. Lungs are clear to auscultation bilaterally. No wheezes, crackles, or rhonchi.  Gastrointestinal: Soft, ttp over the LLQ, non distended with positive bowel sounds. No rebound or  guarding. Genitourinary: No CVA tenderness. Musculoskeletal: Nontender with normal range of motion in all extremities. No edema, cyanosis, or erythema of extremities. Neurologic: Normal speech and language. Face is symmetric. Moving all extremities. No gross focal neurologic deficits are appreciated. Skin: Skin is warm, dry and intact. No rash noted. Psychiatric: Mood and affect are normal. Speech and behavior are normal.  ____________________________________________   LABS (all labs ordered are listed, but only abnormal results are displayed)  Labs Reviewed  COMPREHENSIVE METABOLIC PANEL - Abnormal; Notable for the following:       Result Value   Calcium 8.6 (*)    All other components within normal limits  CBC - Abnormal; Notable for the following:    WBC 12.8 (*)    All other components within normal limits  URINALYSIS, COMPLETE (UACMP) WITH MICROSCOPIC - Abnormal; Notable for the following:    Color, Urine YELLOW (*)    APPearance HAZY (*)    Hgb urine dipstick SMALL (*)    Squamous Epithelial / LPF 6-30 (*)    All other components within normal limits  LIPASE, BLOOD  POC URINE PREG, ED  POCT PREGNANCY, URINE   ____________________________________________  EKG  none ____________________________________________  RADIOLOGY  US doppler pelvis: Negative  CT a/p: Negative ____________________________________________   PROCEDURES  Procedure(s) performed: None Procedures Critical Care performed:  None ____________________________________________   INITIAL IMPRESSION / ASSESSMENT AND PLAN / ED COURSE  31 y.o. female h/o ovarian cystswho present for evaluation of sudden onset LLQ abdominal pain radiating to her lower back. Patient has significant tenderness to palpation on the left lower quadrant with no rebound or guarding, vital signs are within normal limits. Blood work showing mild leukocytosis with white count of 12.8, normal CMP, pregnancy test is negative. UA  with small amount of blood. Differential diagnoses including ovarian torsion, hemorrhagic ovarian cysts, kidney stone, STD, diverticulitis. We'll send patient for [place] of her ovaries. Will give IV toradol and zofran. Patient declined pelvic exam as she had one done last week with negative STD screening  Clinical Course as of Feb 20 1807  Thu Feb 20, 2016  1807 CT abdomen and pelvis, ultrasound, blood work, and urinalysis without any acute findings. Patient be discharged home on supportive care and close follow-up with primary care doctor. Patient's pain has resolved after 1 dose of Toradol.  [CV]    Clinical Course User Index [CV] Nita Sickle, MD    Pertinent labs & imaging results that were available during my care of the patient were reviewed by me and considered in my medical decision making (see chart for details).    ____________________________________________   FINAL CLINICAL IMPRESSION(S) / ED DIAGNOSES  Final diagnoses:  LLQ abdominal pain      NEW MEDICATIONS STARTED DURING THIS VISIT:  New Prescriptions   No medications on file     Note:  This document was  prepared using Conservation officer, historic buildings and may include unintentional dictation errors.    Nita Sickle, MD 02/20/16 [phone]

## 2016-03-04 ENCOUNTER — Emergency Department: Payer: Medicaid Other

## 2016-03-04 ENCOUNTER — Emergency Department
Admission: EM | Admit: 2016-03-04 | Discharge: 2016-03-04 | Disposition: A | Discharge status: Home / Self Care | Payer: Medicaid Other | Attending: Emergency Medicine | Admitting: Emergency Medicine

## 2016-03-04 ENCOUNTER — Encounter: Payer: Self-pay | Admitting: Emergency Medicine

## 2016-03-04 DIAGNOSIS — F17210 Nicotine dependence, cigarettes, uncomplicated: Secondary | ICD-10-CM | POA: Insufficient documentation

## 2016-03-04 DIAGNOSIS — O99331 Smoking (tobacco) complicating pregnancy, first trimester: Secondary | ICD-10-CM | POA: Insufficient documentation

## 2016-03-04 DIAGNOSIS — R1032 Left lower quadrant pain: Secondary | ICD-10-CM | POA: Diagnosis present

## 2016-03-04 DIAGNOSIS — Z3A01 Less than 8 weeks gestation of pregnancy: Secondary | ICD-10-CM | POA: Diagnosis not present

## 2016-03-04 DIAGNOSIS — Z3201 Encounter for pregnancy test, result positive: Secondary | ICD-10-CM

## 2016-03-04 DIAGNOSIS — R102 Pelvic and perineal pain: Secondary | ICD-10-CM | POA: Insufficient documentation

## 2016-03-04 DIAGNOSIS — Z791 Long term (current) use of non-steroidal anti-inflammatories (NSAID): Secondary | ICD-10-CM | POA: Diagnosis not present

## 2016-03-04 DIAGNOSIS — R1084 Generalized abdominal pain: Secondary | ICD-10-CM

## 2016-03-04 DIAGNOSIS — O00202 Left ovarian pregnancy without intrauterine pregnancy: Secondary | ICD-10-CM

## 2016-03-04 HISTORY — DX: Unspecified ovarian cyst, unspecified side: N83.209

## 2016-03-04 LAB — COMPREHENSIVE METABOLIC PANEL
ALT: 17 U/L (ref 14–54)
AST: 18 U/L (ref 15–41)
Albumin: 3.9 g/dL (ref 3.5–5.0)
Alkaline Phosphatase: 46 U/L (ref 38–126)
Anion gap: 6 (ref 5–15)
BILIRUBIN TOTAL: 0.4 mg/dL (ref 0.3–1.2)
BUN: 9 mg/dL (ref 6–20)
CALCIUM: 8.7 mg/dL — AB (ref 8.9–10.3)
CO2: 24 mmol/L (ref 22–32)
CREATININE: 0.61 mg/dL (ref 0.44–1.00)
Chloride: 106 mmol/L (ref 101–111)
Glucose, Bld: 108 mg/dL — ABNORMAL HIGH (ref 65–99)
Potassium: 3.4 mmol/L — ABNORMAL LOW (ref 3.5–5.1)
Sodium: 136 mmol/L (ref 135–145)
TOTAL PROTEIN: 6.7 g/dL (ref 6.5–8.1)

## 2016-03-04 LAB — HCG, QUANTITATIVE, PREGNANCY: hCG, Beta Chain, Quant, S: 560 m[IU]/mL — ABNORMAL HIGH (ref <5)

## 2016-03-04 LAB — URINALYSIS, COMPLETE (UACMP) WITH MICROSCOPIC
Bacteria, UA: NONE SEEN
Bilirubin Urine: NEGATIVE
GLUCOSE, UA: NEGATIVE mg/dL
Hgb urine dipstick: NEGATIVE
Ketones, ur: NEGATIVE mg/dL
Leukocytes, UA: NEGATIVE
Nitrite: NEGATIVE
PROTEIN: NEGATIVE mg/dL
RBC / HPF: NONE SEEN RBC/hpf (ref 0–5)
SPECIFIC GRAVITY, URINE: 1.013 (ref 1.005–1.030)
pH: 7 (ref 5.0–8.0)

## 2016-03-04 LAB — POCT PREGNANCY, URINE: Preg Test, Ur: POSITIVE — AB

## 2016-03-04 LAB — CBC
HEMATOCRIT: 36.6 % (ref 35.0–47.0)
Hemoglobin: 12.7 g/dL (ref 12.0–16.0)
MCH: 31.4 pg (ref 26.0–34.0)
MCHC: 34.8 g/dL (ref 32.0–36.0)
MCV: 90.2 fL (ref 80.0–100.0)
Platelets: 307 10*3/uL (ref 150–440)
RBC: 4.06 MIL/uL (ref 3.80–5.20)
RDW: 13.5 % (ref 11.5–14.5)
WBC: 9.2 10*3/uL (ref 3.6–11.0)

## 2016-03-04 LAB — TYPE AND SCREEN
ABO/RH(D): O POS
Antibody Screen: NEGATIVE

## 2016-03-04 MED ORDER — OXYCODONE-ACETAMINOPHEN 5-325 MG PO TABS: 1.0000 | ORAL_TABLET | Freq: Four times a day (QID) | ORAL | 0 refills | Status: DC | PRN

## 2016-03-04 MED ORDER — ACETAMINOPHEN 500 MG PO TABS
1000.0000 mg | ORAL_TABLET | Freq: Once | ORAL | Status: AC
  Administered 2016-03-04: 1000 mg via ORAL
  Filled 2016-03-04: qty 2

## 2016-03-04 MED ORDER — METOCLOPRAMIDE HCL 5 MG/ML IJ SOLN
10.0000 mg | Freq: Once | INTRAMUSCULAR | Status: AC
  Administered 2016-03-04: 10 mg via INTRAVENOUS
  Filled 2016-03-04: qty 2

## 2016-03-04 NOTE — ED Provider Notes (Signed)
[facility] Emergency Department Provider Note  ____________________________________________  Time seen: Approximately 3:03 PM  I have reviewed the triage vital signs and the nursing notes.   HISTORY  Chief Complaint Abdominal Pain   HPI Diamond Sutton is a 31 y.o. female with a history of ovarian cyst who presents for evaluation of left lower quadrant abdominal pain. Patient was seen here 2 weeks ago with similar complaint and had a negative pelvic ultrasound and CT abdomen and pelvis. She reports that the pain resolved and started again yesterday. Patient was not pregnant [redacted] weeks ago her pregnancy test today is positive. She is complaining of 8 out of 10 sharp pain that is constant located in the left lower quadrant and nonradiating. She also has had nausea but no vomiting, no diarrhea, no vaginal bleeding, no vaginal discharge, no constipation, no dysuria, no chest pain or shortness of breath  Past Medical History:  Diagnosis Date  . Depression 2001  . Ovarian cyst     Patient Active Problem List   Diagnosis Date Noted  . Postpartum care following vaginal delivery 01/01/2015  . 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 DILATION AND CURETTAGE OF UTERUS     SAB    Prior to Admission medications   Medication Sig Start Date End Date Taking? Authorizing Provider  Prenatal Vit-Fe Fumarate-FA (MULTIVITAMIN-PRENATAL) 27-0.8 MG TABS tablet Take 1 tablet by mouth daily at 12 noon.   Yes Historical Provider, MD  hydrOXYzine (ATARAX/VISTARIL) 50 MG tablet Take 1 tablet (50 mg total) by mouth 3 (three) times daily as needed. Patient not taking: Reported on 02/20/2016 02/10/16   Joni Reiningonald K Smith, PA-C  ibuprofen (ADVIL,MOTRIN) 600 MG tablet Take 1 tablet (600 mg total) by mouth every 6 (six) hours as needed for mild pain. Patient not taking: Reported on 02/20/2016 01/01/15   Tresea MallJane Gledhill, CNM  methylPREDNISolone  (MEDROL DOSEPAK) 4 MG TBPK tablet Take Tapered dose as directed Patient not taking: Reported on 02/20/2016 02/10/16   Joni Reiningonald K Smith, PA-C  oxyCODONE-acetaminophen (ROXICET) 5-325 MG tablet Take 1 tablet by mouth every 6 (six) hours as needed. 03/04/16 03/04/17  Nita Sicklearolina Shaela Boer, MD    Allergies Penicillins  Family History  Problem Relation Age of Onset  . Diabetes Mother     Social History Social History  Substance Use Topics  . Smoking status: Current Some Day Smoker    Years: 15.00    Types: Cigarettes    Last attempt to quit: 04/23/2014  . Smokeless tobacco: Never Used     Comment: pt states she quit when she found out she was pregnant.   . Alcohol use No     Comment: drinks on weekends    Review of Systems  Constitutional: Negative for fever. Eyes: Negative for visual changes. ENT: Negative for sore throat. Neck: No neck pain  Cardiovascular: Negative for chest pain. Respiratory: Negative for shortness of breath. Gastrointestinal: + LLQ abdominal pain and nausea. No vomiting or diarrhea. Genitourinary: Negative for dysuria. Musculoskeletal: Negative for back pain. Skin: Negative for rash. Neurological: Negative for headaches, weakness or numbness. Psych: No SI or HI  ____________________________________________   PHYSICAL EXAM:  VITAL SIGNS: ED Triage Vitals  Enc Vitals Group     BP 03/04/16 1219 127/87     Pulse Rate 03/04/16 1219 78     Resp 03/04/16 1219 16     Temp 03/04/16 1219 98.4 F (36.9 C)  Temp Source 03/04/16 1219 Oral     SpO2 03/04/16 1219 100 %     Weight 03/04/16 1220 155 lb (70.3 kg)     Height 03/04/16 1220 5\' 6"  (1.676 m)     Head Circumference --      Peak Flow --      Pain Score 03/04/16 1226 8     Pain Loc --      Pain Edu? --      Excl. in GC? --     Constitutional: Alert and oriented. Well appearing and in no apparent distress. HEENT:      Head: Normocephalic and atraumatic.         Eyes: Conjunctivae are normal. Sclera is  non-icteric. EOMI. PERRL      Mouth/Throat: Mucous membranes are moist.       Neck: Supple with no signs of meningismus. Cardiovascular: Regular rate and rhythm. No murmurs, gallops, or rubs. 2+ symmetrical distal pulses are present in all extremities. No JVD. Respiratory: Normal respiratory effort. Lungs are clear to auscultation bilaterally. No wheezes, crackles, or rhonchi.  Gastrointestinal: Soft, ttp over the LLQ, non distended with positive bowel sounds. No rebound or guarding. Genitourinary: No CVA tenderness. Musculoskeletal: Nontender with normal range of motion in all extremities. No edema, cyanosis, or erythema of extremities. Neurologic: Normal speech and language. Face is symmetric. Moving all extremities. No gross focal neurologic deficits are appreciated. Skin: Skin is warm, dry and intact. No rash noted. Psychiatric: Mood and affect are normal. Speech and behavior are normal.  ____________________________________________   LABS (all labs ordered are listed, but only abnormal results are displayed)  Labs Reviewed  HCG, QUANTITATIVE, PREGNANCY - Abnormal; Notable for the following:       Result Value   hCG, Beta Chain, Quant, S 560 (*)    All other components within normal limits  COMPREHENSIVE METABOLIC PANEL - Abnormal; Notable for the following:    Potassium 3.4 (*)    Glucose, Bld 108 (*)    Calcium 8.7 (*)    All other components within normal limits  URINALYSIS, COMPLETE (UACMP) WITH MICROSCOPIC - Abnormal; Notable for the following:    Color, Urine YELLOW (*)    APPearance HAZY (*)    Squamous Epithelial / LPF 6-30 (*)    All other components within normal limits  POCT PREGNANCY, URINE - Abnormal; Notable for the following:    Preg Test, Ur POSITIVE (*)    All other components within normal limits  CBC  POC URINE PREG, ED  TYPE AND SCREEN   ____________________________________________  EKG  none   ____________________________________________  RADIOLOGY  TV US: Findings suspicious for left adnexal ectopic pregnancy, although indeterminate. Consider serial beta HCG with repeat pelvic sonography if clinically warranted. ____________________________________________   PROCEDURES  Procedure(s) performed: None Procedures Critical Care performed:  None ____________________________________________   INITIAL IMPRESSION / ASSESSMENT AND PLAN / ED COURSE  31 y.o. female with a history of ovarian cyst who presents for evaluation of left lower quadrant abdominal pain x 2 days. Patient has positive pregnancy test here with hCG of 560. Normal VS, she has ttp over the LLQ but no rebound or guarding and is extremely well appearing. Will check TVUS to rule out ectopic. Will give reglan and tylenol for symptoms. No vaginal bleeding. UA and labs pending.  Clinical Course as of Mar 04 2142  Wed Mar 04, 2016  1746 [place] concerning for L ectopic. Obgyn pged. Patient stable.  [CV]  1754 Spoke  with Dr. Tiburcio Pea who will come down to evaluate the patient.  [CV]  1842 Dr. Valentino Saxon called me back and is on her way to evaluate the patient. Patient remains stable and has been updated.   [CV]  1924 Patient evaluated by Dr. Valentino Saxon. She discussed medical management versus surgical management and patient wishes to wait at this time since the ultrasound is not 100% conclusive of an ectopic pregnancy. She will follow up with OB in 48 hours for repeat ultrasound and trending of her hCG. Patient was instructed both by me and OB/GYN to return to the emergency room immediately if she has worsening abdominal pain  [CV]    Clinical Course User Index [CV] Nita Sickle, MD    Pertinent labs & imaging results that were available during my care of the patient were reviewed by me and considered in my medical decision making (see chart for details).    ____________________________________________   FINAL CLINICAL  IMPRESSION(S) / ED DIAGNOSES  Final diagnoses:  Left ovarian pregnancy without intrauterine pregnancy      NEW MEDICATIONS STARTED DURING THIS VISIT:  Discharge Medication List as of 03/04/2016  7:28 PM    START taking these medications   Details  oxyCODONE-acetaminophen (ROXICET) 5-325 MG tablet Take 1 tablet by mouth every 6 (six) hours as needed., Starting Wed 03/04/2016, Until Thu 03/04/2017, Print         Note:  This document was prepared using Dragon voice recognition software and may include unintentional dictation errors.    Nita Sickle, MD 03/04/16 2144

## 2016-03-04 NOTE — ED Notes (Addendum)
See triage note; pt with lower abdominal pain. She is [redacted] weeks pregnant per her last period. She denies urinary symptoms other than frequency. Denies bleeding. NAD noted.

## 2016-03-04 NOTE — Consult Note (Addendum)
GYNECOLOGY CONSULT NOTE  Reason for Consult: suspected left ectopic pregnancy Referring Physician: Dr. Rudene Re (ER Physician)  Diamond Sutton is an 31 y.o. [id number] female who was seen in the Emergency Room due to complaints of lower abdominal pain x 2 days.  Notes nausea but no vomiting. Denies vaginal bleeding. Was noted to have a positive pregnancy test today. Ultrasound performed today notes suspicious left adnexal mass, concerning for ectopic. Patient's last menstrual period was 01/11/2016 (exact date).  Should be ~ 7 weeks by dates.   Of note, patient was seen in the ER 2 weeks ago for similar complaints, at that time pregnancy test was negative and ultrasound revealed no adnexal masses.    Pertinent Gynecological History: Menses: regular every 28-30 days without intermenstrual spotting Contraception: none DES exposure: denies Blood transfusions: none Sexually transmitted diseases: no past history Previous GYN Procedures: DNC (2007 for miscarraige) Last pap: normal Date: 2015 (during last pregnancy)   Menstrual History: Menarche age: 29 Patient's last menstrual period was 01/11/2016 (exact date).     OB History  Gravida Para Term Preterm AB Living  _0 SAB TAB Ectopic Multiple Live Births  1 0 0 0 6    # Outcome Date GA Lbr Len/2nd Weight Sex Delivery Anes PTL Lv  8 Current           7 Preterm 12/30/14 [redacted]w[redacted]d 6 lb 0.7 oz (2.74 kg) M Vag-Spont EPI  LIV  6 Term 09/11/12 367w0d7 lb 5 oz (3.317 kg) M Vag-Spont   LIV  5 Term 08/05/09 3755w0d lb 14 oz (3.118 kg) F Vag-Spont   LIV  4 Term 03/19/07 40w63w0dlb 7 oz (3.374 kg) M Vag-Spont   LIV  3 Term 05/05/05 37w063w0db 11 oz (3.033 kg) M Vag-Spont   LIV  2 SAB 2006 3w0d49w0dB     1 Term 05/05/02 [redacted]w[redacted]d 26w0d13 oz (3.09 kg) M Vag-Spont   LIV       Past Medical History:  Diagnosis Date  . Depression 2001  . Ovarian cyst     Past Surgical History:  Procedure Laterality Date  . DILATION AND  CURETTAGE OF UTERUS     SAB    Family History  Problem Relation Age of Onset  . Diabetes Mother     Social History   Social History  . Marital status: Single    Spouse name: N/A  . Number of children: N/A  . Years of education: N/A   Occupational History  . Not on file.   Social History Main Topics  . Smoking status: Current Some Day Smoker    Years: 15.00    Types: Cigarettes    Last attempt to quit: 04/23/2014  . Smokeless tobacco: Never Used     Comment: pt states she quit when she found out she was pregnant.   . Alcohol use No     Comment: drinks on weekends  . Drug use: No  . Sexual activity: Yes   Other Topics Concern  . Not on file   Social History Narrative  . No narrative on file    Allergies:  Allergies  Allergen Reactions  . Penicillins Anaphylaxis    12/2014: received ancef w/o complications  Has patient had a PCN reaction causing immediate rash, facial/tongue/throat swelling, SOB or lightheadedness with hypotension: Yes Has patient had a PCN reaction causing severe rash involving  mucus membranes or skin necrosis: Yes Has patient had a PCN reaction that required hospitalization Yes Has patient had a PCN reaction occurring within the last 10 years: No If all of the above answers are "NO", then may proceed with Cephalosporin use.     Medications: Prior to Admission: PNV  Review of Systems  Constitutional: Negative for chills, fever and weight loss.  HENT: Negative.  Negative for hearing loss.   Eyes: Negative.   Respiratory: Negative for cough and shortness of breath.   Cardiovascular: Negative for chest pain, palpitations and leg swelling.  Gastrointestinal: Positive for abdominal pain and nausea. Negative for constipation and vomiting.  Genitourinary: Negative for dysuria, frequency, hematuria and urgency.  Musculoskeletal: Negative.   Skin: Negative.  Negative for itching and rash.  Neurological: Negative.   Endo/Heme/Allergies: Negative.    Psychiatric/Behavioral: Negative.     Blood pressure 127/87, pulse 78, temperature 98.4 F (36.9 C), temperature source Oral, resp. rate 16, height _0  (1.676 m), weight 155 lb (70.3 kg), last menstrual period 01/11/2016, SpO2 100 %, not currently breastfeeding. Physical Exam  Constitutional: She is oriented to person, place, and time. She appears well-developed and well-nourished. No distress.  HENT:  Head: Normocephalic and atraumatic.  Mouth/Throat: Oropharynx is clear and moist.  Eyes: Conjunctivae and EOM are normal. Pupils are equal, round, and reactive to light. No scleral icterus.  Neck: Normal range of motion. Neck supple. No thyromegaly present.  Cardiovascular: Normal rate, regular rhythm and normal heart sounds.  Exam reveals no gallop and no friction rub.   No murmur heard. Respiratory: Effort normal and breath sounds normal. No respiratory distress.  GI: Soft. Bowel sounds are normal. She exhibits no distension and no mass. There is tenderness. There is no rebound and no guarding.  Mild tenderness in generalized lower abdomen.   Genitourinary: Vagina normal and uterus normal. No vaginal discharge found.  Genitourinary Comments: Cervix closed, no lesions.  No adnexal fullness, slight tenderness on left.   Musculoskeletal: Normal range of motion. She exhibits no edema or tenderness.  Neurological: She is alert and oriented to person, place, and time.  Skin: Skin is warm and dry. No rash noted.  Psychiatric: She has a normal mood and affect. Her behavior is normal. Thought content normal.    Results for orders placed or performed during the hospital encounter of 03/04/16 (from the past 48 hour(s))  hCG, quantitative, pregnancy     Status: Abnormal   Collection Time: 03/04/16 12:28 PM  Result Value Ref Range   hCG, Beta Chain, Quant, S 560 (H) <5 mIU/mL    Comment:          GEST. AGE      CONC.  (mIU/mL)   <=1 WEEK        5 - 50     2 WEEKS       50 - 500     3 WEEKS        100 - 10,000     4 WEEKS     1,000 - 30,000     5 WEEKS     3,500 - 115,000   6-8 WEEKS     12,000 - 270,000    12 WEEKS     15,000 - 220,000        FEMALE AND NON-PREGNANT FEMALE:     LESS THAN 5 mIU/mL   Pregnancy, urine POC     Status: Abnormal   Collection Time: 03/04/16 12:39 PM  Result Value  Ref Range   Preg Test, Ur POSITIVE (A) NEGATIVE    Comment:        THE SENSITIVITY OF THIS METHODOLOGY IS >24 mIU/mL   Urinalysis, Complete w Microscopic     Status: Abnormal   Collection Time: 03/04/16  2:56 PM  Result Value Ref Range   Color, Urine YELLOW (A) YELLOW   APPearance HAZY (A) CLEAR   Specific Gravity, Urine 1.013 1.005 - 1.030   pH 7.0 5.0 - 8.0   Glucose, UA NEGATIVE NEGATIVE mg/dL   Hgb urine dipstick NEGATIVE NEGATIVE   Bilirubin Urine NEGATIVE NEGATIVE   Ketones, ur NEGATIVE NEGATIVE mg/dL   Protein, ur NEGATIVE NEGATIVE mg/dL   Nitrite NEGATIVE NEGATIVE   Leukocytes, UA NEGATIVE NEGATIVE   RBC / HPF NONE SEEN 0 - 5 RBC/hpf   WBC, UA 0-5 0 - 5 WBC/hpf   Bacteria, UA NONE SEEN NONE SEEN   Squamous Epithelial / LPF 6-30 (A) NONE SEEN   Mucous PRESENT   CBC     Status: None   Collection Time: 03/04/16  3:00 PM  Result Value Ref Range   WBC 9.2 3.6 - 11.0 K/uL   RBC 4.06 3.80 - 5.20 MIL/uL   Hemoglobin 12.7 12.0 - 16.0 g/dL   HCT 36.6 35.0 - 47.0 %   MCV 90.2 80.0 - 100.0 fL   MCH 31.4 26.0 - 34.0 pg   MCHC 34.8 32.0 - 36.0 g/dL   RDW 13.5 11.5 - 14.5 %   Platelets 307 150 - 440 K/uL  Comprehensive metabolic panel     Status: Abnormal   Collection Time: 03/04/16  3:00 PM  Result Value Ref Range   Sodium 136 135 - 145 mmol/L   Potassium 3.4 (L) 3.5 - 5.1 mmol/L   Chloride 106 101 - 111 mmol/L   CO2 24 22 - 32 mmol/L   Glucose, Bld 108 (H) 65 - 99 mg/dL   BUN 9 6 - 20 mg/dL   Creatinine, Ser 0.61 0.44 - 1.00 mg/dL   Calcium 8.7 (L) 8.9 - 10.3 mg/dL   Total Protein 6.7 6.5 - 8.1 g/dL   Albumin 3.9 3.5 - 5.0 g/dL   AST 18 15 - 41 U/L   ALT 17 14 -  54 U/L   Alkaline Phosphatase 46 38 - 126 U/L   Total Bilirubin 0.4 0.3 - 1.2 mg/dL   GFR calc non Af Amer >60 >60 mL/min   GFR calc Af Amer >60 >60 mL/min    Comment: (NOTE) The eGFR has been calculated using the CKD EPI equation. This calculation has not been validated in all clinical situations. eGFR's persistently <60 mL/min signify possible Chronic Kidney Disease.    Anion gap 6 5 - 15  Type and screen     Status: None (Preliminary result)   Collection Time: 03/04/16  6:02 PM  Result Value Ref Range   ABO/RH(D) PENDING    Antibody Screen PENDING    Sample Expiration 03/07/2016     US Ob Comp Less 14 Wks  Result Date: 03/04/2016 CLINICAL DATA:  Left-sided abdominal pain x2 days, pregnant, beta HCG 560 EXAM: OBSTETRIC <14 WK [place] AND TRANSVAGINAL OB US TECHNIQUE: Both transabdominal and transvaginal ultrasound examinations were performed for complete evaluation of the gestation as well as the maternal uterus, adnexal regions, and pelvic cul-de-sac. Transvaginal technique was performed to assess early pregnancy. COMPARISON:  None. FINDINGS: Intrauterine gestational sac: None Yolk sac:  Not Visualized. Embryo:  Not Visualized. Maternal uterus/adnexae: Complex  fluid/hemorrhage within the endometrial cavity. Right ovary is within normal limits. Suspected left ovarian corpus luteal cyst. Adjacent 1.2 x 1.1 cm lesion in the left adnexa with possible yolk sac, worrisome for left adnexal ectopic pregnancy, although indeterminate. Small volume pelvic ascites. IMPRESSION: No IUP is visualized. Findings suspicious for left adnexal ectopic pregnancy, although indeterminate. Consider serial beta HCG with repeat pelvic sonography if clinically warranted. These results were called by telephone at the time of interpretation on 03/04/2016 at 5:24 pm to [facility], who verbally acknowledged these results. Electronically Signed   By: Julian Hy M.D.   On: 03/04/2016 17:29   US Ob Transvaginal  Result  Date: 03/04/2016 CLINICAL DATA:  Left-sided abdominal pain x2 days, pregnant, beta HCG 560 EXAM: OBSTETRIC <14 WK [place] AND TRANSVAGINAL OB US TECHNIQUE: Both transabdominal and transvaginal ultrasound examinations were performed for complete evaluation of the gestation as well as the maternal uterus, adnexal regions, and pelvic cul-de-sac. Transvaginal technique was performed to assess early pregnancy. COMPARISON:  None. FINDINGS: Intrauterine gestational sac: None Yolk sac:  Not Visualized. Embryo:  Not Visualized. Maternal uterus/adnexae: Complex fluid/hemorrhage within the endometrial cavity. Right ovary is within normal limits. Suspected left ovarian corpus luteal cyst. Adjacent 1.2 x 1.1 cm lesion in the left adnexa with possible yolk sac, worrisome for left adnexal ectopic pregnancy, although indeterminate. Small volume pelvic ascites. IMPRESSION: No IUP is visualized. Findings suspicious for left adnexal ectopic pregnancy, although indeterminate. Consider serial beta HCG with repeat pelvic sonography if clinically warranted. These results were called by telephone at the time of interpretation on 03/04/2016 at 5:24 pm to [facility], who verbally acknowledged these results. Electronically Signed   By: Julian Hy M.D.   On: 03/04/2016 17:29    Assessment/Plan: Patient with early IUP (low BHCG ranges note patient is ~ [redacted] weeks pregnant) inconsistent with LMP, vs left ectopic pregnancy.  Patient also with a h/o ovarian cysts in the past.  Counseled patient on options for management: expectant management vs methotrexate vs surgical intervention with laparoscopic removal.  Risks and benefits of all modalities discussed; all questions answered.  Patient opted for expectant management for now.  She was told to call if she changes her mind. Patient will come to the office in 2 days to repeat BHCG.  Discussed if abnormal rise in HCG, or pain increases, it would be advisable at that time to proceed with either  methotrxate or surgical intervention.  Patient advised to call or come in for evaluation for any concerning symptoms; bleeding and pain precautions were strictly emphasized. Can discharge home with short supply of pain meds until office visit.    Rubie Maid, MD [facility]

## 2016-03-04 NOTE — ED Triage Notes (Signed)
Pt presents with lower abdominal pain x 2 days. She states she not bleeding. Denies urinary pain, affirms frequency. Pt states she was seen here a week or so ago and was told she was not pregnant. Health dept told her she is 7 weeks, 4 days. If pregnant, this is pt's 7th pregnancy; she has 6 living children. Pt alert & oriented with NAD noted.

## 2016-03-06 ENCOUNTER — Other Ambulatory Visit: Payer: Medicaid Other

## 2016-03-07 ENCOUNTER — Encounter (HOSPITAL_COMMUNITY): Payer: Self-pay | Admitting: *Deleted

## 2016-03-07 ENCOUNTER — Inpatient Hospital Stay (HOSPITAL_COMMUNITY): Payer: Medicaid Other

## 2016-03-07 ENCOUNTER — Inpatient Hospital Stay (HOSPITAL_COMMUNITY)
Admission: AD | Admit: 2016-03-07 | Discharge: 2016-03-07 | Disposition: A | Discharge status: Home / Self Care | Payer: Medicaid Other | Source: Ambulatory Visit | Attending: Obstetrics and Gynecology | Admitting: Obstetrics and Gynecology

## 2016-03-07 DIAGNOSIS — O00102 Left tubal pregnancy without intrauterine pregnancy: Secondary | ICD-10-CM | POA: Diagnosis not present

## 2016-03-07 DIAGNOSIS — N939 Abnormal uterine and vaginal bleeding, unspecified: Secondary | ICD-10-CM | POA: Diagnosis present

## 2016-03-07 LAB — CBC WITH DIFFERENTIAL/PLATELET
Basophils Absolute: 0 10*3/uL (ref 0.0–0.1)
Basophils Relative: 0 %
Eosinophils Absolute: 0.1 10*3/uL (ref 0.0–0.7)
Eosinophils Relative: 2 %
HEMATOCRIT: 35.7 % — AB (ref 36.0–46.0)
HEMOGLOBIN: 12.6 g/dL (ref 12.0–15.0)
LYMPHS PCT: 34 %
Lymphs Abs: 2.7 10*3/uL (ref 0.7–4.0)
MCH: 30.4 pg (ref 26.0–34.0)
MCHC: 35.3 g/dL (ref 30.0–36.0)
MCV: 86 fL (ref 78.0–100.0)
MONOS PCT: 5 %
Monocytes Absolute: 0.4 10*3/uL (ref 0.1–1.0)
NEUTROS PCT: 59 %
Neutro Abs: 4.9 10*3/uL (ref 1.7–7.7)
Platelets: 347 10*3/uL (ref 150–400)
RBC: 4.15 MIL/uL (ref 3.87–5.11)
RDW: 13.4 % (ref 11.5–15.5)
WBC: 8.2 10*3/uL (ref 4.0–10.5)

## 2016-03-07 LAB — WET PREP, GENITAL
Sperm: NONE SEEN
Trich, Wet Prep: NONE SEEN
Yeast Wet Prep HPF POC: NONE SEEN

## 2016-03-07 LAB — CREATININE, SERUM: Creatinine, Ser: 0.61 mg/dL (ref 0.44–1.00)

## 2016-03-07 LAB — BUN: BUN: 8 mg/dL (ref 6–20)

## 2016-03-07 LAB — AST: AST: 38 U/L (ref 15–41)

## 2016-03-07 LAB — HCG, QUANTITATIVE, PREGNANCY: HCG, BETA CHAIN, QUANT, S: 137 m[IU]/mL — AB (ref <5)

## 2016-03-07 MED ORDER — METHOTREXATE INJECTION FOR WOMEN'S HOSPITAL
50.0000 mg/m2 | Freq: Once | INTRAMUSCULAR | Status: AC
  Administered 2016-03-07: 90 mg via INTRAMUSCULAR
  Filled 2016-03-07: qty 1.8

## 2016-03-07 NOTE — Discharge Instructions (Signed)
Ectopic Pregnancy °An ectopic pregnancy happens when a fertilized egg grows outside the uterus. A pregnancy cannot live outside of the uterus. This problem often happens in the fallopian tube. It is often caused by damage to the fallopian tube. °If this problem is found early, you may be treated with medicine. If your tube tears or bursts open (ruptures), you will bleed inside. This is an emergency. You will need surgery. Get help right away. °What are the signs or symptoms? °You may have normal pregnancy symptoms at first. These include: °· Missing your period. °· Feeling sick to your stomach (nauseous). °· Being tired. °· Having tender breasts. °Then, you may start to have symptoms that are not normal. These include: °· Pain with sex (intercourse). °· Bleeding from the vagina. This includes light bleeding (spotting). °· Belly (abdomen) or lower belly cramping or pain. This may be felt on one side. °· A fast heartbeat (pulse). °· Passing out (fainting) after going poop (bowel movement). °If your tube tears, you may have symptoms such as: °· Really bad pain in the belly or lower belly. This happens suddenly. °· Dizziness. °· Passing out. °· Shoulder pain. °Get help right away if: °You have any of these symptoms. This is an emergency. °This information is not intended to replace advice given to you by your health care provider. Make sure you discuss any questions you have with your health care provider. °Document Released: 04/03/2008 Document Revised: 06/13/2015 Document Reviewed: 08/17/2012 °Elsevier Interactive Patient Education © 2017 [employer] °Methotrexate Treatment for an Ectopic Pregnancy, Care After °Refer to this sheet in the next few weeks. These instructions provide you with information on caring for yourself after your procedure. Your health care provider may also give you more specific instructions. Your treatment has been planned according to current medical practices, but problems sometimes occur.  Call your health care provider if you have any problems or questions after your procedure. °WHAT TO EXPECT AFTER THE PROCEDURE °You may have some abdominal cramping, vaginal bleeding, and fatigue in the first few days after taking methotrexate. Some other possible side effects of methotrexate include: °· Nausea. °· Vomiting. °· Diarrhea. °· Mouth sores. °· Swelling or irritation of the lining of your lungs (pneumonitis). °· Liver damage. °· Hair loss. °HOME CARE INSTRUCTIONS  °After you have received the methotrexate medicine, you need to be careful of your activities and watch your condition for several weeks. It may take 1 week before your hormone levels return to normal. °· Keep all follow-up appointments as directed by your health care provider. °· Avoid traveling too far away from your health care provider. °· Do not have sexual intercourse until your health care provider says it is safe to do so. °· You may resume your usual diet. °· Limit strenuous activity. °· Do not take folic acid, prenatal vitamins, or other vitamins that contain folic acid. °· Do not take aspirin, ibuprofen, or naproxen (nonsteroidal anti-inflammatory drugs [NSAIDs]). °· Do not drink alcohol. °SEEK MEDICAL CARE IF:  °· You cannot control your nausea and vomiting. °· You cannot control your diarrhea. °· You have sores in your mouth and want treatment. °· You need pain medicine for your abdominal pain. °· You have a rash. °· You are having a reaction to the medicine. °SEEK IMMEDIATE MEDICAL CARE IF:  °· You have increasing abdominal or pelvic pain. °· You notice increased bleeding. °· You feel light-headed, or you faint. °· You have shortness of breath. °· Your heart rate increases. °·   You have a cough. °· You have chills. °· You have a fever. °This information is not intended to replace advice given to you by your health care provider. Make sure you discuss any questions you have with your health care provider. °Document Released:  12/25/2010 Document Revised: 01/10/2013 Document Reviewed: 10/24/2012 °Elsevier Interactive Patient Education © 2017 Elsevier Inc. ° °

## 2016-03-07 NOTE — MAU Provider Note (Signed)
History   [id number] in with cramping and vag bleeding x 3 days. States passed sm clot this morning. States bleeding is sm amt.  CSN: [id number]  Arrival date & time 03/07/16  1204   None     Chief Complaint  Patient presents with  . Vaginal Bleeding    HPI  Past Medical History:  Diagnosis Date  . Depression 2001  . Ovarian cyst     Past Surgical History:  Procedure Laterality Date  . DILATION AND CURETTAGE OF UTERUS     SAB    Family History  Problem Relation Age of Onset  . Diabetes Mother     Social History  Substance Use Topics  . Smoking status: Current Some Day Smoker    Years: 15.00    Types: Cigarettes    Last attempt to quit: 04/23/2014  . Smokeless tobacco: Never Used     Comment: pt states she quit when she found out she was pregnant.   . Alcohol use No     Comment: drinks on weekends    OB History    Gravida Para Term Preterm AB Living   8 6 5 1 1 6    SAB TAB Ectopic Multiple Live Births   1 0 0 0 6      Review of Systems  Constitutional: Negative.   HENT: Negative.   Eyes: Negative.   Respiratory: Negative.   Cardiovascular: Negative.   Gastrointestinal: Positive for abdominal pain.  Genitourinary: Positive for vaginal bleeding.  Musculoskeletal: Negative.   Skin: Negative.     Allergies  Penicillins  Home Medications    BP 127/88 (BP Location: Right Arm)   Pulse 83   Temp 98.8 F (37.1 C) (Oral)   Resp 16   Ht 5\' 6"  (1.676 m)   Wt 152 lb (68.9 kg)   LMP 01/11/2016 (Exact Date)   BMI 24.53 kg/m   Physical Exam  Constitutional: She is oriented to person, place, and time. She appears well-developed and well-nourished.  HENT:  Head: Normocephalic.  Eyes: Pupils are equal, round, and reactive to light.  Neck: Normal range of motion.  Cardiovascular: Normal rate, regular rhythm, normal heart sounds and intact distal pulses.   Pulmonary/Chest: Effort normal and breath sounds normal.  Abdominal: Soft. Bowel sounds are normal.   Genitourinary: Vagina normal and uterus normal.  Genitourinary Comments: sm amt bright vag bleeding  Musculoskeletal: Normal range of motion.  Neurological: She is alert and oriented to person, place, and time. She has normal reflexes.  Skin: Skin is warm and dry.  Psychiatric: She has a normal mood and affect. Her behavior is normal. Judgment and thought content normal.    MAU Course  Procedures (including critical care time)  Labs Reviewed  WET PREP, GENITAL - Abnormal; Notable for the following:       Result Value   Clue Cells Wet Prep HPF POC PRESENT (*)    WBC, Wet Prep HPF POC FEW (*)    All other components within normal limits  HCG, QUANTITATIVE, PREGNANCY - Abnormal; Notable for the following:    hCG, Beta Chain, Quant, S 137 (*)    All other components within normal limits  GC/CHLAMYDIA PROBE AMP ([place]) NOT AT [facility]   No results found.   No diagnosis found.    MDM  Left ectopic Plan: pelvic us shows left ectopic, us reviewed by Dr. Jolayne Pantheronstant, wet prep, GC Chla. VSS. POC discussed to offer pt methotrexate. Lengthy discussion with pt regarding  diagnosis and treatment options. She desires Methotrexate therapy. Labs WNL. F/u inst to pt for f/u tues in clinic at 1100 inst her she would be there apx 2 hs and Friday in clinic at 0800 and she would also be there apx two hourrs as well. She verbalized understanding of inst. Will d/c home

## 2016-03-07 NOTE — MAU Note (Signed)
Was recently at [facility].  Was having pain in lower abd, unable to see preg on US; hx of cysts on her ovaries..   Had repeat blood work yesterday, does not know results.  Woke up this morning with some bleeding, passed a small clot, not having any pain.

## 2016-03-09 ENCOUNTER — Other Ambulatory Visit: Payer: Self-pay

## 2016-03-09 ENCOUNTER — Other Ambulatory Visit: Payer: Self-pay | Admitting: Student

## 2016-03-09 DIAGNOSIS — Z3A01 Less than 8 weeks gestation of pregnancy: Secondary | ICD-10-CM

## 2016-03-09 LAB — GC/CHLAMYDIA PROBE AMP (~~LOC~~) NOT AT ARMC
Chlamydia: NEGATIVE
Neisseria Gonorrhea: NEGATIVE

## 2016-03-10 ENCOUNTER — Telehealth: Payer: Self-pay

## 2016-03-10 ENCOUNTER — Telehealth: Payer: Self-pay | Admitting: General Practice

## 2016-03-10 ENCOUNTER — Ambulatory Visit: Payer: Medicaid Other | Admitting: *Deleted

## 2016-03-10 DIAGNOSIS — O00109 Unspecified tubal pregnancy without intrauterine pregnancy: Secondary | ICD-10-CM

## 2016-03-10 DIAGNOSIS — O0000 Abdominal pregnancy without intrauterine pregnancy: Secondary | ICD-10-CM

## 2016-03-10 LAB — HCG, QUANTITATIVE, PREGNANCY: HCG, BETA CHAIN, QUANT, S: 214 m[IU]/mL — AB (ref <5)

## 2016-03-10 LAB — BETA HCG QUANT (REF LAB): hCG Quant: 168 m[IU]/mL

## 2016-03-10 NOTE — Progress Notes (Signed)
Pt in for stat hcg following methotrexate on 03/07/16. Pts hcg level on that day was 137. She is having some light bleeding and very little discomfort.  Pt advised to wait for results. She states that she is not able to wait but can be reached by phone and will return today if necessary. Pt gave phone number of [phone]. [facility], she will call patient with results and plan.

## 2016-03-10 NOTE — Telephone Encounter (Signed)
Patient called into front office and asked if it was normal to have pressure in her rectum. Asked patient if she was constipated and she states no. Asked patient if she was bleeding and she states a little bit. Told patient it could be her body getting ready to pass tissue/baby and that could be causing the pressure. Patient verbalized understanding and asked about her bhcg levels. Told patient I will place her on hold and let Geraldine ContrasDee talk to her because I was uncertain what the plan was. Patient verbalized understanding & I placed call on hold. Patient later hung up. Will have Dee call patient with plan of care

## 2016-03-13 ENCOUNTER — Ambulatory Visit: Payer: Medicaid Other

## 2016-03-13 DIAGNOSIS — Z3A01 Less than 8 weeks gestation of pregnancy: Secondary | ICD-10-CM

## 2016-03-13 LAB — HCG, QUANTITATIVE, PREGNANCY: hCG, Beta Chain, Quant, S: 133 m[IU]/mL — ABNORMAL HIGH (ref <5)

## 2016-03-13 NOTE — Progress Notes (Signed)
Patient returned to office today for a repeat beta. Patient stated she is currently having some bleeding but no other symptoms. Patient will not be able to stay for her results but I have given her the back door number to call us in two hours for her test results. Per provider Dr.Arnold  patient should follow up Monday for a repeat BHCG (NOT STAT). I have placed patient on the scheduled for 03/16/2016 for bhcg. Pt voice understanding.

## 2016-03-16 ENCOUNTER — Telehealth: Payer: Self-pay

## 2016-03-16 NOTE — Telephone Encounter (Signed)
[facility] is getting care there.

## 2016-03-16 NOTE — Telephone Encounter (Signed)
-----   Message from Hildred LaserAnika Cherry, MD sent at 03/16/2016 12:54 PM EST ----- Regarding: beta levels Please inform patient that her hormone levels have actually decreased.  I would recommend that she follow up again for another BHCG in the next 1-2 days.  If it is continuing to decrease, she likely had a miscarriage.

## 2016-03-17 ENCOUNTER — Other Ambulatory Visit: Payer: Medicaid Other

## 2016-03-17 DIAGNOSIS — O3680X0 Pregnancy with inconclusive fetal viability, not applicable or unspecified: Secondary | ICD-10-CM

## 2016-03-18 ENCOUNTER — Telehealth: Payer: Self-pay

## 2016-03-18 LAB — BETA HCG QUANT (REF LAB): hCG Quant: 41 m[IU]/mL

## 2016-03-18 NOTE — Telephone Encounter (Signed)
Patient has been informed of lab results. Appointment has been scheduled for next Tuesday 03/24/2016 at 10:00

## 2016-03-18 NOTE — Telephone Encounter (Signed)
-----   Message from Diamond HornErin Lawrence, NP sent at 03/18/2016  8:20 AM EST ----- Pt should return for weekly BHCGs until negative  Thanks!

## 2016-03-18 NOTE — Telephone Encounter (Signed)
Call patient regarding BHCG results. She will follow up next Tuesday for next appointment.

## 2016-03-19 ENCOUNTER — Telehealth: Payer: Self-pay | Admitting: General Practice

## 2016-03-19 NOTE — Telephone Encounter (Signed)
Patient called and left message stating she wants to know if she can transfer to [place] because she is doing too much driving coming all the way here every week. Called patient, no answer- left message stating we are trying to reach you to return your phone call, please call us back

## 2016-03-20 NOTE — Telephone Encounter (Signed)
Patient called again with same concerns.

## 2016-03-23 ENCOUNTER — Telehealth: Payer: Self-pay

## 2016-03-23 NOTE — Telephone Encounter (Signed)
Pt called and requested to have her appts switched to [place].   Contacted pt in regards to transferring care.  Pt stated that she has had a miscarriage and just that it is too far for her to come her for her appts.  I explained to the pt that the upcoming appt she has scheduled should be her last due to her pregnancy levels dropping significantly so she will not need to be concerned of long distance travel.  Pt stated understanding with no further questions.

## 2016-03-23 NOTE — Telephone Encounter (Signed)
Patient concerns have already been address

## 2016-03-25 ENCOUNTER — Other Ambulatory Visit: Payer: Medicaid Other

## 2016-03-26 ENCOUNTER — Other Ambulatory Visit: Payer: Medicaid Other

## 2016-03-26 DIAGNOSIS — O3680X0 Pregnancy with inconclusive fetal viability, not applicable or unspecified: Secondary | ICD-10-CM

## 2016-03-27 LAB — BETA HCG QUANT (REF LAB): hCG Quant: <1 m[IU]/mL

## 2016-04-20 ENCOUNTER — Encounter: Payer: Self-pay | Admitting: *Deleted

## 2016-04-20 ENCOUNTER — Emergency Department: Payer: Medicaid Other

## 2016-04-20 ENCOUNTER — Emergency Department
Admission: EM | Admit: 2016-04-20 | Discharge: 2016-04-20 | Disposition: A | Discharge status: Home / Self Care | Payer: Medicaid Other | Attending: Emergency Medicine | Admitting: Emergency Medicine

## 2016-04-20 DIAGNOSIS — Y92410 Unspecified street and highway as the place of occurrence of the external cause: Secondary | ICD-10-CM | POA: Insufficient documentation

## 2016-04-20 DIAGNOSIS — Y999 Unspecified external cause status: Secondary | ICD-10-CM | POA: Diagnosis not present

## 2016-04-20 DIAGNOSIS — M542 Cervicalgia: Secondary | ICD-10-CM | POA: Insufficient documentation

## 2016-04-20 DIAGNOSIS — M549 Dorsalgia, unspecified: Secondary | ICD-10-CM | POA: Diagnosis not present

## 2016-04-20 DIAGNOSIS — S199XXA Unspecified injury of neck, initial encounter: Secondary | ICD-10-CM | POA: Diagnosis present

## 2016-04-20 DIAGNOSIS — Y9389 Activity, other specified: Secondary | ICD-10-CM | POA: Diagnosis not present

## 2016-04-20 DIAGNOSIS — F17210 Nicotine dependence, cigarettes, uncomplicated: Secondary | ICD-10-CM | POA: Insufficient documentation

## 2016-04-20 MED ORDER — ACETAMINOPHEN 325 MG PO TABS
650.0000 mg | ORAL_TABLET | Freq: Once | ORAL | Status: AC
  Administered 2016-04-20: 650 mg via ORAL
  Filled 2016-04-20: qty 2
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
## 2016-04-22 ENCOUNTER — Emergency Department
Admission: EM | Admit: 2016-04-22 | Discharge: 2016-04-22 | Disposition: A | Discharge status: Home / Self Care | Payer: Medicaid Other | Attending: Emergency Medicine | Admitting: Emergency Medicine

## 2016-04-22 ENCOUNTER — Emergency Department: Payer: Medicaid Other

## 2016-04-22 ENCOUNTER — Encounter: Payer: Self-pay | Admitting: Emergency Medicine

## 2016-04-22 DIAGNOSIS — Z3A01 Less than 8 weeks gestation of pregnancy: Secondary | ICD-10-CM | POA: Insufficient documentation

## 2016-04-22 DIAGNOSIS — O26891 Other specified pregnancy related conditions, first trimester: Secondary | ICD-10-CM | POA: Diagnosis present

## 2016-04-22 DIAGNOSIS — R102 Pelvic and perineal pain: Secondary | ICD-10-CM | POA: Diagnosis not present

## 2016-04-22 DIAGNOSIS — F17210 Nicotine dependence, cigarettes, uncomplicated: Secondary | ICD-10-CM | POA: Insufficient documentation

## 2016-04-22 DIAGNOSIS — R109 Unspecified abdominal pain: Secondary | ICD-10-CM

## 2016-04-22 LAB — URINALYSIS, COMPLETE (UACMP) WITH MICROSCOPIC
BILIRUBIN URINE: NEGATIVE
Bacteria, UA: NONE SEEN
GLUCOSE, UA: NEGATIVE mg/dL
Hgb urine dipstick: NEGATIVE
KETONES UR: NEGATIVE mg/dL
LEUKOCYTES UA: NEGATIVE
NITRITE: NEGATIVE
PROTEIN: NEGATIVE mg/dL
Specific Gravity, Urine: 1.001 — ABNORMAL LOW (ref 1.005–1.030)
pH: 7 (ref 5.0–8.0)

## 2016-04-22 LAB — COMPREHENSIVE METABOLIC PANEL
ALT: 49 U/L (ref 14–54)
ANION GAP: 5 (ref 5–15)
AST: 29 U/L (ref 15–41)
Albumin: 3.9 g/dL (ref 3.5–5.0)
Alkaline Phosphatase: 53 U/L (ref 38–126)
BUN: 7 mg/dL (ref 6–20)
CHLORIDE: 107 mmol/L (ref 101–111)
CO2: 24 mmol/L (ref 22–32)
Calcium: 8.4 mg/dL — ABNORMAL LOW (ref 8.9–10.3)
Creatinine, Ser: 0.72 mg/dL (ref 0.44–1.00)
Glucose, Bld: 92 mg/dL (ref 65–99)
POTASSIUM: 3 mmol/L — AB (ref 3.5–5.1)
Sodium: 136 mmol/L (ref 135–145)
TOTAL PROTEIN: 6.8 g/dL (ref 6.5–8.1)
Total Bilirubin: 0.3 mg/dL (ref 0.3–1.2)

## 2016-04-22 LAB — CBC
HEMATOCRIT: 36.1 % (ref 35.0–47.0)
Hemoglobin: 12.4 g/dL (ref 12.0–16.0)
MCH: 30.9 pg (ref 26.0–34.0)
MCHC: 34.3 g/dL (ref 32.0–36.0)
MCV: 90 fL (ref 80.0–100.0)
Platelets: 320 10*3/uL (ref 150–440)
RBC: 4.01 MIL/uL (ref 3.80–5.20)
RDW: 13.7 % (ref 11.5–14.5)
WBC: 11.5 10*3/uL — AB (ref 3.6–11.0)

## 2016-04-22 LAB — POCT PREGNANCY, URINE: Preg Test, Ur: POSITIVE — AB

## 2016-04-22 LAB — HCG, QUANTITATIVE, PREGNANCY: hCG, Beta Chain, Quant, S: 7312 m[IU]/mL — ABNORMAL HIGH (ref <5)
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
## 2016-04-22 NOTE — ED Provider Notes (Signed)
Sign off from Dr. Derrill Kay to follow up with your sound in this 31 year old female with a recent ectopic pregnancy treated medically this past February. Patient presented initially with right lower quadrant/right adnexal pain. She is pending or sound at this time. Beta of 7300.  Physical Exam  BP (!) 149/94 (BP Location: Left Arm)   Pulse (!) 103   Temp 98.4 F (36.9 C) (Oral)   Resp 20   Ht  (1.676 m)   Wt 152 lb (68.9 kg)   LMP 01/11/2016 (Exact Date)   SpO2 100%   BMI 24.53 kg/m  ----------------------------------------- 11:43 PM on 04/22/2016 -----------------------------------------   Physical Exam Patient without any tenderness to the abdomen or pelvis. ED Course  Procedures  MDM US OB Transvaginal (Final result)  Result time 04/22/16 22:38:15  Final result by Adrian Prows, MD (04/22/16 22:38:15)           Narrative:   CLINICAL DATA: Right pelvic pain for 1 day  EXAM: OBSTETRIC <14 WK [place] AND TRANSVAGINAL OB  US DOPPLER ULTRASOUND OF OVARIES  TECHNIQUE: Both transabdominal and transvaginal ultrasound examinations were performed for complete evaluation of the gestation as well as the maternal uterus, adnexal regions, and pelvic cul-de-sac. Transvaginal technique was performed to assess early pregnancy.  Color and duplex Doppler ultrasound was utilized to evaluate blood flow to the ovaries.  COMPARISON: 03/07/2016  FINDINGS: Intrauterine gestational sac: Visualized  Yolk sac: Possible tiny yolk sac visualized.  Embryo: Not visualized  Cardiac Activity: Not visualized  MSD: 8.9 mm  5 w  5 d  Subchorionic hemorrhage: None visualized.  Maternal uterus/adnexae: The right ovary measures 2 x 3.6 x 2 cm. There appears to be a hypoechoic mass adjacent to or arising from the right ovary, measuring 1.1 by 1.4 x 1.4 cm. Internal vascularity is present. The left ovary measures 2.4 x 3.1 x 2.5 cm. Hypoechoic complex lesion in the left ovary  measuring 1.9 x 1.9 x 2.2 cm with peripheral flow. Small free fluid in the pelvis.  Pulsed Doppler evaluation of both ovaries demonstrates normal appearing low-resistance arterial and venous waveform of the right ovary. Color flow is present within the left ovary.  IMPRESSION: 1. No sonographic evidence for right ovarian torsion 2. Fluid-filled structure within the uterus, likely represents gestational sac with possible tiny yolk sac visualized. There is no embryo visualized at this time. There are bilateral hypoechoic adnexal masses, possible hemorrhagic luteal cyst on the left, nonspecific on the right. Correlation with serial HCG is advised with possible repeat ultrasound to exclude possibility of heterotopic pregnancy in this patient with prior history of ectopic. 3. Small free fluid in the pelvis   Electronically Signed By: Jasmine Pang M.D. On: 04/22/2016 22:38            [place] Art/Ven Flow Abd Pelv Doppler (Final result)  Result time 04/22/16 22:38:15  Final result by Adrian Prows, MD (04/22/16 22:38:15)           Narrative:   CLINICAL DATA: Right pelvic pain for 1 day  EXAM: OBSTETRIC <14 WK [place] AND TRANSVAGINAL OB  US DOPPLER ULTRASOUND OF OVARIES  TECHNIQUE: Both transabdominal and transvaginal ultrasound examinations were performed for complete evaluation of the gestation as well as the maternal uterus, adnexal regions, and pelvic cul-de-sac. Transvaginal technique was performed to assess early pregnancy.  Color and duplex Doppler ultrasound was utilized to evaluate blood flow to the ovaries.  COMPARISON: 03/07/2016  FINDINGS: Intrauterine gestational sac: Visualized  Yolk sac:  Possible tiny yolk sac visualized.  Embryo: Not visualized  Cardiac Activity: Not visualized  MSD: 8.9 mm  5 w  5 d  Subchorionic hemorrhage: None visualized.  Maternal uterus/adnexae: The right ovary measures 2 x 3.6 x 2 cm. There appears to be a  hypoechoic mass adjacent to or arising from the right ovary, measuring 1.1 by 1.4 x 1.4 cm. Internal vascularity is present. The left ovary measures 2.4 x 3.1 x 2.5 cm. Hypoechoic complex lesion in the left ovary measuring 1.9 x 1.9 x 2.2 cm with peripheral flow. Small free fluid in the pelvis.  Pulsed Doppler evaluation of both ovaries demonstrates normal appearing low-resistance arterial and venous waveform of the right ovary. Color flow is present within the left ovary.  IMPRESSION: 1. No sonographic evidence for right ovarian torsion 2. Fluid-filled structure within the uterus, likely represents gestational sac with possible tiny yolk sac visualized. There is no embryo visualized at this time. There are bilateral hypoechoic adnexal masses, possible hemorrhagic luteal cyst on the left, nonspecific on the right. Correlation with serial HCG is advised with possible repeat ultrasound to exclude possibility of heterotopic pregnancy in this patient with prior history of ectopic. 3. Small free fluid in the pelvis   Electronically Signed By: Jasmine Pang M.D. On: 04/22/2016 22:38            US OB Comp Less 14 Wks (Final result)  Result time 04/22/16 22:38:15  Procedure changed from US Pelvis Complete  Final result by Adrian Prows, MD (04/22/16 22:38:15)           Narrative:   CLINICAL DATA: Right pelvic pain for 1 day  EXAM: OBSTETRIC <14 WK [place] AND TRANSVAGINAL OB  US DOPPLER ULTRASOUND OF OVARIES  TECHNIQUE: Both transabdominal and transvaginal ultrasound examinations were performed for complete evaluation of the gestation as well as the maternal uterus, adnexal regions, and pelvic cul-de-sac. Transvaginal technique was performed to assess early pregnancy.  Color and duplex Doppler ultrasound was utilized to evaluate blood flow to the ovaries.  COMPARISON: 03/07/2016  FINDINGS: Intrauterine gestational sac: Visualized  Yolk sac: Possible tiny yolk  sac visualized.  Embryo: Not visualized  Cardiac Activity: Not visualized  MSD: 8.9 mm  5 w  5 d  Subchorionic hemorrhage: None visualized.  Maternal uterus/adnexae: The right ovary measures 2 x 3.6 x 2 cm. There appears to be a hypoechoic mass adjacent to or arising from the right ovary, measuring 1.1 by 1.4 x 1.4 cm. Internal vascularity is present. The left ovary measures 2.4 x 3.1 x 2.5 cm. Hypoechoic complex lesion in the left ovary measuring 1.9 x 1.9 x 2.2 cm with peripheral flow. Small free fluid in the pelvis.  Pulsed Doppler evaluation of both ovaries demonstrates normal appearing low-resistance arterial and venous waveform of the right ovary. Color flow is present within the left ovary.  IMPRESSION: 1. No sonographic evidence for right ovarian torsion 2. Fluid-filled structure within the uterus, likely represents gestational sac with possible tiny yolk sac visualized. There is no embryo visualized at this time. There are bilateral hypoechoic adnexal masses, possible hemorrhagic luteal cyst on the left, nonspecific on the right. Correlation with serial HCG is advised with possible repeat ultrasound to exclude possibility of heterotopic pregnancy in this patient with prior history of ectopic. 3. Small free fluid in the pelvis   Electronically Signed By: Jasmine Pang M.D. On: 04/22/2016 22:38           ----------------------------------------- 11:44 PM on 04/22/2016 -----------------------------------------  I discussed the labs sent with the patient who says that she has known bilateral ovarian cysts. I also discussed the case Dr. Valentino Saxon as well as the exam that the patient is without any distress this time and nontender. She recommends that the patient follow-up in 48 hours for repeat beta hCG and ultrasound. The patient says that she already has an appointment this coming Friday at 10:45 AM. We discussed immediate return to the emergency department  for any worsening or concerning symptoms especially bleeding and increased abdominal pain. The patient is understanding of this and willing to comply. Possibilities still include early ectopic pregnancy as well as missed abortion or abdominal pain from pregnancy.       Myrna Blazer, MD 04/22/16 [phone]

## 2016-04-22 NOTE — ED Provider Notes (Signed)
[facility] Emergency Department Provider Note   ____________________________________________   I have reviewed the triage vital signs and the nursing notes.   HISTORY  Chief Complaint Abdominal Pain   History limited by: Not Limited   HPI Diamond Sutton is a 31 y.o. female who presents to the emergency department today because of concern for right lower quadrant pain. The patient states that the pain started today. It has been cramping. It has been fairly consistent throughout the day. It has not been accompanied by any abnormal vaginal discharge or bleeding. She denies any change in defecation or urination. Patient states she recently took a home pregnancy test which was positive. Had a left ectopic pregnancy 2 months ago which was treated slowly with medication. She has had some nausea in the mornings recently. No fevers.   Past Medical History:  Diagnosis Date  . Depression 2001  . Ovarian cyst     Patient Active Problem List   Diagnosis Date Noted  . Postpartum care following vaginal delivery 01/01/2015  . 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 DILATION AND CURETTAGE OF UTERUS     SAB    Prior to Admission medications   Medication Sig Start Date End Date Taking? Authorizing Provider  hydrOXYzine (ATARAX/VISTARIL) 50 MG tablet Take 1 tablet (50 mg total) by mouth 3 (three) times daily as needed. Patient not taking: Reported on 02/20/2016 02/10/16   Joni Reining, PA-C  ibuprofen (ADVIL,MOTRIN) 600 MG tablet Take 1 tablet (600 mg total) by mouth every 6 (six) hours as needed for mild pain. Patient not taking: Reported on 02/20/2016 01/01/15   Tresea Mall, CNM  methylPREDNISolone (MEDROL DOSEPAK) 4 MG TBPK tablet Take Tapered dose as directed Patient not taking: Reported on 02/20/2016 02/10/16   Joni Reining, PA-C  oxyCODONE-acetaminophen (ROXICET) 5-325 MG tablet Take 1 tablet by mouth every 6  (six) hours as needed. Patient not taking: Reported on 03/07/2016 03/04/16 03/04/17  Nita Sickle, MD  Prenatal Vit-Fe Fumarate-FA (MULTIVITAMIN-PRENATAL) 27-0.8 MG TABS tablet Take 1 tablet by mouth daily at 12 noon.     Historical Provider, MD    Allergies Penicillins  Family History  Problem Relation Age of Onset  . Diabetes Mother     Social History Social History  Substance Use Topics  . Smoking status: Current Some Day Smoker    Years: 15.00    Types: Cigarettes    Last attempt to quit: 04/23/2014  . Smokeless tobacco: Never Used     Comment: pt states she quit when she found out she was pregnant.   . Alcohol use No     Comment: drinks on weekends    Review of Systems  Constitutional: Negative for fever. Cardiovascular: Negative for chest pain. Respiratory: Negative for shortness of breath. Gastrointestinal: Positive for right lower quadrant pain. Genitourinary: Negative for dysuria. Musculoskeletal: Negative for back pain. Skin: Negative for rash. Neurological: Negative for headaches, focal weakness or numbness.  10-point ROS otherwise negative.  ____________________________________________   PHYSICAL EXAM:  VITAL SIGNS: ED Triage Vitals  Enc Vitals Group     BP 04/22/16 1931 (!) 149/94     Pulse Rate 04/22/16 1931 (!) 103     Resp 04/22/16 1931 20     Temp 04/22/16 1931 98.4 F (36.9 C)     Temp Source 04/22/16 1931 Oral     SpO2 04/22/16 1931 100 %     Weight 04/22/16 1931 152  lb (68.9 kg)     Height 04/22/16 1931  (1.676 m)     Head Circumference --      Peak Flow --      Pain Score 04/22/16 1930 7   Constitutional: Alert and oriented. Well appearing and in no distress. Eyes: Conjunctivae are normal. Normal extraocular movements. ENT   Head: Normocephalic and atraumatic.   Nose: No congestion/rhinnorhea.   Mouth/Throat: Mucous membranes are moist.   Neck: No stridor. Hematological/Lymphatic/Immunilogical: No cervical  lymphadenopathy. Cardiovascular: Normal rate, regular rhythm.  No murmurs, rubs, or gallops.  Respiratory: Normal respiratory effort without tachypnea nor retractions. Breath sounds are clear and equal bilaterally. No wheezes/rales/rhonchi. Gastrointestinal: Soft and tender in the right lower quadrant. No rebound. No guarding.  Genitourinary: Deferred Musculoskeletal: Normal range of motion in all extremities. No lower extremity edema. Neurologic:  Normal speech and language. No gross focal neurologic deficits are appreciated.  Skin:  Skin is warm, dry and intact. No rash noted. Psychiatric: Mood and affect are normal. Speech and behavior are normal. Patient exhibits appropriate insight and judgment.  ____________________________________________    LABS (pertinent positives/negatives)  Labs Reviewed  COMPREHENSIVE METABOLIC PANEL - Abnormal; Notable for the following:       Result Value   Potassium 3.0 (*)    Calcium 8.4 (*)    All other components within normal limits  CBC - Abnormal; Notable for the following:    WBC 11.5 (*)    All other components within normal limits  URINALYSIS, COMPLETE (UACMP) WITH MICROSCOPIC - Abnormal; Notable for the following:    Color, Urine STRAW (*)    APPearance CLEAR (*)    Specific Gravity, Urine 1.001 (*)    Squamous Epithelial / LPF 0-5 (*)    All other components within normal limits  HCG, QUANTITATIVE, PREGNANCY - Abnormal; Notable for the following:    hCG, Beta Chain, Quant, S 7,312 (*)    All other components within normal limits  POCT PREGNANCY, URINE - Abnormal; Notable for the following:    Preg Test, Ur POSITIVE (*)    All other components within normal limits  POC URINE PREG, ED     ____________________________________________   EKG  None  ____________________________________________    RADIOLOGY  [place] pending at time of  signout  ____________________________________________   PROCEDURES  Procedures  ____________________________________________   INITIAL IMPRESSION / ASSESSMENT AND PLAN / ED COURSE  Pertinent labs & imaging results that were available during my care of the patient were reviewed by me and considered in my medical decision making (see chart for details).  Patient presented to the emergency department today because of concerns for right lower quadrant pain in the setting of early pregnancy. Patient did have an ectopic pregnancy couple of months ago on the left side. Will plan on obtaining an ultrasound to evaluate.  ____________________________________________   FINAL CLINICAL IMPRESSION(S) / ED DIAGNOSES  Final diagnoses:  Pelvic pain  Abdominal pain during pregnancy in first trimester     Note: This dictation was prepared with Dragon dictation. Any transcriptional errors that result from this process are unintentional     Phineas Semen, MD 04/23/16 1615

## 2016-04-24 ENCOUNTER — Ambulatory Visit (INDEPENDENT_AMBULATORY_CARE_PROVIDER_SITE_OTHER): Payer: Medicaid Other | Admitting: Certified Nurse Midwife

## 2016-04-24 ENCOUNTER — Encounter: Payer: Self-pay | Admitting: Certified Nurse Midwife

## 2016-04-24 VITALS — BP 118/65 | HR 93 | Ht 66.0 in | Wt 154.4 lb

## 2016-04-24 DIAGNOSIS — O0911 Supervision of pregnancy with history of ectopic or molar pregnancy, first trimester: Secondary | ICD-10-CM

## 2016-04-24 NOTE — Progress Notes (Signed)
GYN ENCOUNTER NOTE  Subjective:       Diamond Sutton is a 31 y.o. [phone] female is here for ER follow up.   She was seen at [facility] ER on 04/22/2016 for abdominal pain. Positive UPT verified by [place].  History significant for ectopic pregnancy treated with methotrexate at [facility] 02/2016.  Denies difficulty breathing or respiratory distress, chest pain, abdominal pain, vaginal bleeding, and leg pain or swelling.    Gynecologic History  Patient's last menstrual period was 01/11/2016 (exact date).  Obstetric History OB History  Gravida Para Term Preterm AB Living  SAB TAB Ectopic Multiple Live Births  1 0 1 0 6    # Outcome Date GA Lbr Len/2nd Weight Sex Delivery Anes PTL Lv  9 Current           8 Ectopic 2018          7 Preterm 12/30/14 [redacted]w[redacted]d  6 lb 0.7 oz (2.74 kg) M Vag-Spont EPI  LIV  6 Term 09/11/12 [redacted]w[redacted]d  7 lb 5 oz (3.317 kg) M Vag-Spont   LIV  5 Term 08/05/09 [redacted]w[redacted]d  6 lb 14 oz (3.118 kg) F Vag-Spont   LIV  4 Term 03/19/07 [redacted]w[redacted]d  7 lb 7 oz (3.374 kg) M Vag-Spont   LIV  3 Term 05/05/05 [redacted]w[redacted]d  6 lb 11 oz (3.033 kg) M Vag-Spont   LIV  2 SAB 2006 [redacted]w[redacted]d    SAB     1 Term 05/05/02 [redacted]w[redacted]d  6 lb 13 oz (3.09 kg) M Vag-Spont   LIV      Past Medical History:  Diagnosis Date  . Depression 2001  . Ectopic pregnancy   . Ovarian cyst     Past Surgical History:  Procedure Laterality Date  . DILATION AND CURETTAGE OF UTERUS     SAB    Current Outpatient Prescriptions on File Prior to Visit  Medication Sig Dispense Refill  . Prenatal Vit-Fe Fumarate-FA (MULTIVITAMIN-PRENATAL) 27-0.8 MG TABS tablet Take 1 tablet by mouth daily at 12 noon.      No current facility-administered medications on file prior to visit.     Allergies  Allergen Reactions  . Penicillins Anaphylaxis    12/2014: received ancef w/o complications  Has patient had a PCN reaction causing immediate rash, facial/tongue/throat swelling, SOB or lightheadedness with hypotension: Yes Has patient had  a PCN reaction causing severe rash involving mucus membranes or skin necrosis: Yes Has patient had a PCN reaction that required hospitalization Yes Has patient had a PCN reaction occurring within the last 10 years: No If all of the above answers are "NO", then may proceed with Cephalosporin use.     Social History   Social History  . Marital status: Single    Spouse name: N/A  . Number of children: N/A  . Years of education: N/A   Occupational History  . Not on file.   Social History Main Topics  . Smoking status: Current Some Day Smoker    Years: 15.00    Types: Cigarettes    Last attempt to quit: 04/23/2014  . Smokeless tobacco: Never Used     Comment: pt states she quit when she found out she was pregnant.   . Alcohol use No     Comment: drinks on weekends  . Drug use: No  . Sexual activity: Yes    Birth control/ protection: None   Other Topics Concern  . Not on file  Social History Narrative  . No narrative on file    Family History  Problem Relation Age of Onset  . Diabetes Mother   . Breast cancer Neg Hx   . Ovarian cancer Neg Hx   . Colon cancer Neg Hx   . Heart disease Neg Hx     The following portions of the patient's history were reviewed and updated as appropriate: allergies, current medications, past family history, past medical history, past social history, past surgical history and problem list.  Review of Systems  Review of Systems - Negative except as noted above History obtained from the patient  Objective:   BP 118/65   Pulse 93   Ht  (1.676 m)   Wt 154 lb 6.4 oz (70 kg)   LMP 01/11/2016 (Exact Date)   Breastfeeding? No   BMI 24.92 kg/m    Alert and oriented x 4, no apparent distress  [facility] Lab work, Completed: 04/22/2016  Positive UPT Beta hCG 7312  [facility] ER [place], Completed: 04/22/2016  EXAM: OBSTETRIC <14 WK [place] AND TRANSVAGINAL OB  US DOPPLER ULTRASOUND OF OVARIES  TECHNIQUE: Both transabdominal and transvaginal  ultrasound examinations were performed for complete evaluation of the gestation as well as the maternal uterus, adnexal regions, and pelvic cul-de-sac. Transvaginal technique was performed to assess early pregnancy.  Color and duplex Doppler ultrasound was utilized to evaluate blood flow to the ovaries.  COMPARISON:  03/07/2016  FINDINGS: Intrauterine gestational sac: Visualized  Yolk sac:  Possible tiny yolk sac visualized.  Embryo:  Not visualized  Cardiac Activity: Not visualized  MSD: 8.9  mm   5 w   5  d  Subchorionic hemorrhage:  None visualized.  Maternal uterus/adnexae: The right ovary measures 2 x 3.6 x 2 cm. There appears to be a hypoechoic mass adjacent to or arising from the right ovary, measuring 1.1 by 1.4 x 1.4 cm. Internal vascularity is present. The left ovary measures 2.4 x 3.1 x 2.5 cm. Hypoechoic complex lesion in the left ovary measuring 1.9 x 1.9 x 2.2 cm with peripheral flow. Small free fluid in the pelvis.  Pulsed Doppler evaluation of both ovaries demonstrates normal appearing low-resistance arterial and venous waveform of the right ovary. Color flow is present within the left ovary.  IMPRESSION: 1. No sonographic evidence for right ovarian torsion 2. Fluid-filled structure within the uterus, likely represents gestational sac with possible tiny yolk sac visualized. There is no embryo visualized at this time. There are bilateral hypoechoic adnexal masses, possible hemorrhagic luteal cyst on the left, nonspecific on the right. Correlation with serial HCG is advised with possible repeat ultrasound to exclude possibility of heterotopic pregnancy in this patient with prior history of ectopic. 3. Small free fluid in the pelvis   Assessment:   1. Pregnancy in first trimester with history of ectopic pregnancy  - Beta HCG, Quant - US OB Transvaginal; Future  Plan:   Reviewed ultrasound findings and lab results  Beta hCG today, will  contact patient with results  RTC X Wednesday for follow up US  Reviewed red flag symptoms and when to call   Gunnar Bulla, CNM

## 2016-04-24 NOTE — Patient Instructions (Signed)
First Trimester of Pregnancy The first trimester of pregnancy is from week 1 until the end of week 13 (months 1 through 3). During this time, your baby will begin to develop inside you. At 6-8 weeks, the eyes and face are formed, and the heartbeat can be seen on ultrasound. At the end of 12 weeks, all the baby's organs are formed. Prenatal care is all the medical care you receive before the birth of your baby. Make sure you get good prenatal care and follow all of your doctor's instructions. Follow these instructions at home: Medicines  Take over-the-counter and prescription medicines only as told by your doctor. Some medicines are safe and some medicines are not safe during pregnancy.  Take a prenatal vitamin that contains at least 600 micrograms (mcg) of folic acid.  If you have trouble pooping (constipation), take medicine that will make your stool soft (stool softener) if your doctor approves. Eating and drinking  Eat regular, healthy meals.  Your doctor will tell you the amount of weight gain that is right for you.  Avoid raw meat and uncooked cheese.  If you feel sick to your stomach (nauseous) or throw up (vomit): ? Eat 4 or 5 small meals a day instead of 3 large meals. ? Try eating a few soda crackers. ? Drink liquids between meals instead of during meals.  To prevent constipation: ? Eat foods that are high in fiber, like fresh fruits and vegetables, whole grains, and beans. ? Drink enough fluids to keep your pee (urine) clear or pale yellow. Activity  Exercise only as told by your doctor. Stop exercising if you have cramps or pain in your lower belly (abdomen) or low back.  Do not exercise if it is too hot, too humid, or if you are in a place of great height (high altitude).  Try to avoid standing for long periods of time. Move your legs often if you must stand in one place for a long time.  Avoid heavy lifting.  Wear low-heeled shoes. Sit and stand up straight.  You  can have sex unless your doctor tells you not to. Relieving pain and discomfort  Wear a good support bra if your breasts are sore.  Take warm water baths (sitz baths) to soothe pain or discomfort caused by hemorrhoids. Use hemorrhoid cream if your doctor says it is okay.  Rest with your legs raised if you have leg cramps or low back pain.  If you have puffy, bulging veins (varicose veins) in your legs: ? Wear support hose or compression stockings as told by your doctor. ? Raise (elevate) your feet for 15 minutes, 3-4 times a day. ? Limit salt in your food. Prenatal care  Schedule your prenatal visits by the twelfth week of pregnancy.  Write down your questions. Take them to your prenatal visits.  Keep all your prenatal visits as told by your doctor. This is important. Safety  Wear your seat belt at all times when driving.  Make a list of emergency phone numbers. The list should include numbers for family, friends, the hospital, and police and fire departments. General instructions  Ask your doctor for a referral to a local prenatal class. Begin classes no later than at the start of month 6 of your pregnancy.  Ask for help if you need counseling or if you need help with nutrition. Your doctor can give you advice or tell you where to go for help.  Do not use hot tubs, steam rooms, or   saunas.  Do not douche or use tampons or scented sanitary pads.  Do not cross your legs for long periods of time.  Avoid all herbs and alcohol. Avoid drugs that are not approved by your doctor.  Do not use any tobacco products, including cigarettes, chewing tobacco, and electronic cigarettes. If you need help quitting, ask your doctor. You may get counseling or other support to help you quit.  Avoid cat litter boxes and soil used by cats. These carry germs that can cause birth defects in the baby and can cause a loss of your baby (miscarriage) or stillbirth.  Visit your dentist. At home, brush  your teeth with a soft toothbrush. Be gentle when you floss. Contact a doctor if:  You are dizzy.  You have mild cramps or pressure in your lower belly.  You have a nagging pain in your belly area.  You continue to feel sick to your stomach, you throw up, or you have watery poop (diarrhea).  You have a bad smelling fluid coming from your vagina.  You have pain when you pee (urinate).  You have increased puffiness (swelling) in your face, hands, legs, or ankles. Get help right away if:  You have a fever.  You are leaking fluid from your vagina.  You have spotting or bleeding from your vagina.  You have very bad belly cramping or pain.  You gain or lose weight rapidly.  You throw up blood. It may look like coffee grounds.  You are around people who have German measles, fifth disease, or chickenpox.  You have a very bad headache.  You have shortness of breath.  You have any kind of trauma, such as from a fall or a car accident. Summary  The first trimester of pregnancy is from week 1 until the end of week 13 (months 1 through 3).  To take care of yourself and your unborn baby, you will need to eat healthy meals, take medicines only if your doctor tells you to do so, and do activities that are safe for you and your baby.  Keep all follow-up visits as told by your doctor. This is important as your doctor will have to ensure that your baby is healthy and growing well. This information is not intended to replace advice given to you by your health care provider. Make sure you discuss any questions you have with your health care provider. Document Released: 06/24/2007 Document Revised: 01/14/2016 Document Reviewed: 01/14/2016 Elsevier Interactive Patient Education  2017 [employer]  

## 2016-04-25 LAB — BETA HCG QUANT (REF LAB): hCG Quant: 9034 m[IU]/mL

## 2016-04-27 ENCOUNTER — Telehealth: Payer: Self-pay | Admitting: Certified Nurse Midwife

## 2016-04-27 NOTE — Telephone Encounter (Signed)
Pt wants to talk to her about her "beta" results. Please advise.

## 2016-04-27 NOTE — Telephone Encounter (Signed)
I released them via MyChart this morning. Went from 7000s to 9000s. Need to come in this week for [place] as previously scheduled.

## 2016-04-27 NOTE — Telephone Encounter (Signed)
Called pt no answer. LM for pt informing her of information below.  

## 2016-04-28 ENCOUNTER — Ambulatory Visit: Payer: Medicaid Other

## 2016-04-29 ENCOUNTER — Ambulatory Visit (INDEPENDENT_AMBULATORY_CARE_PROVIDER_SITE_OTHER): Payer: Medicaid Other

## 2016-04-29 ENCOUNTER — Ambulatory Visit (INDEPENDENT_AMBULATORY_CARE_PROVIDER_SITE_OTHER): Payer: Medicaid Other | Admitting: Obstetrics and Gynecology

## 2016-04-29 DIAGNOSIS — O200 Threatened abortion: Secondary | ICD-10-CM

## 2016-04-29 DIAGNOSIS — O0911 Supervision of pregnancy with history of ectopic or molar pregnancy, first trimester: Secondary | ICD-10-CM

## 2016-04-29 NOTE — Progress Notes (Signed)
HPI:      Diamond Sutton is a 31 y.o. [id number] who LMP was Patient's last menstrual period was 01/11/2016 (exact date).  Subjective:   She presents today Seen after ultrasound. The ultrasound reveals a gestational sac with possible yolk sac but no evidence of fetal pole. Patient denies bleeding or cramping. She was attempting pregnancy. Her previous pregnancy ended in ectopic February 2018 and was treated using methotrexate. She did not have a period after that pregnancy.    Hx: The following portions of the patient's history were reviewed and updated as appropriate:              She  has a past medical history of Depression (2001); Ectopic pregnancy; and Ovarian cyst. She  does not have any pertinent problems on file. She  has a past surgical history that includes Dilation and curettage of uterus. Her family history includes Diabetes in her mother. She  reports that she has been smoking Cigarettes.  She has smoked for the past 15.00 years. She has never used smokeless tobacco. She reports that she does not drink alcohol or use drugs. Current Outpatient Prescriptions on File Prior to Visit  Medication Sig Dispense Refill  . Prenatal Vit-Fe Fumarate-FA (MULTIVITAMIN-PRENATAL) 27-0.8 MG TABS tablet Take 1 tablet by mouth daily at 12 noon.      No current facility-administered medications on file prior to visit.          Review of Systems:  Review of Systems  Constitutional: Denied constitutional symptoms, night sweats, recent illness, fatigue, fever, insomnia and weight loss.  Eyes: Denied eye symptoms, eye pain, photophobia, vision change and visual disturbance.  Ears/Nose/Throat/Neck: Denied ear, nose, throat or neck symptoms, hearing loss, nasal discharge, sinus congestion and sore throat.  Cardiovascular: Denied cardiovascular symptoms, arrhythmia, chest pain/pressure, edema, exercise intolerance, orthopnea and palpitations.  Respiratory: Denied pulmonary symptoms, asthma,  pleuritic pain, productive sputum, cough, dyspnea and wheezing.  Gastrointestinal: Denied, gastro-esophageal reflux, melena, nausea and vomiting.  Genitourinary: Denied genitourinary symptoms including symptomatic vaginal discharge, pelvic relaxation issues, and urinary complaints.  Musculoskeletal: Denied musculoskeletal symptoms, stiffness, swelling, muscle weakness and myalgia.  Dermatologic: Denied dermatology symptoms, rash and scar.  Neurologic: Denied neurology symptoms, dizziness, headache, neck pain and syncope.  Psychiatric: Denied psychiatric symptoms, anxiety and depression.  Endocrine: Denied endocrine symptoms including hot flashes and night sweats.   Meds:   Current Outpatient Prescriptions on File Prior to Visit  Medication Sig Dispense Refill  . Prenatal Vit-Fe Fumarate-FA (MULTIVITAMIN-PRENATAL) 27-0.8 MG TABS tablet Take 1 tablet by mouth daily at 12 noon.      No current facility-administered medications on file prior to visit.     Objective:     There were no vitals filed for this visit.            We discussed the ultrasound results directly and in detail.  Assessment:    [id number] Patient Active Problem List   Diagnosis Date Noted  . Postpartum care following vaginal delivery 01/01/2015  . Grand multiparity 11/23/2014     1. Threatened abortion     Mainly by ultrasound showing a gestational sac without fetal pole.  Additionally quadrants are not rising appropriately.   As patient desires pregnancy and is currently asymptomatic and this is likely an intrauterine pregnancy, not ectopic, she would like to be absolutely sure this is a nonviable pregnancy before any type of intervention.   Plan:            1.  Follow-up ultrasound on day.  2.  I have begun discussing the possibility of using medication to effect a miscarriage for a nonviable pregnancy versus D&E. She will consider these options depending on the findings of ultrasound on  Monday.   Orders Orders Placed This Encounter  Procedures  . US OB Transvaginal       F/U  Return in about 5 days (around 05/04/2016).  Elonda Husky, M.D. 04/29/2016 5:22 PM

## 2016-05-04 ENCOUNTER — Ambulatory Visit (INDEPENDENT_AMBULATORY_CARE_PROVIDER_SITE_OTHER): Payer: Medicaid Other

## 2016-05-04 DIAGNOSIS — O200 Threatened abortion: Secondary | ICD-10-CM

## 2016-05-22 ENCOUNTER — Ambulatory Visit (INDEPENDENT_AMBULATORY_CARE_PROVIDER_SITE_OTHER): Payer: Medicaid Other | Admitting: Obstetrics and Gynecology

## 2016-05-22 VITALS — BP 119/66 | HR 81 | Wt 154.8 lb

## 2016-05-22 DIAGNOSIS — Z1389 Encounter for screening for other disorder: Secondary | ICD-10-CM

## 2016-05-22 DIAGNOSIS — N760 Acute vaginitis: Secondary | ICD-10-CM

## 2016-05-22 DIAGNOSIS — R110 Nausea: Secondary | ICD-10-CM

## 2016-05-22 DIAGNOSIS — B9689 Other specified bacterial agents as the cause of diseases classified elsewhere: Secondary | ICD-10-CM

## 2016-05-22 DIAGNOSIS — Z113 Encounter for screening for infections with a predominantly sexual mode of transmission: Secondary | ICD-10-CM

## 2016-05-22 DIAGNOSIS — Z3481 Encounter for supervision of other normal pregnancy, first trimester: Secondary | ICD-10-CM

## 2016-05-22 MED ORDER — METRONIDAZOLE 500 MG PO TABS: 500.0000 mg | ORAL_TABLET | Freq: Two times a day (BID) | ORAL | 0 refills | Status: DC

## 2016-05-22 MED ORDER — PROMETHAZINE HCL 25 MG PO TABS: 25.0000 mg | ORAL_TABLET | Freq: Three times a day (TID) | ORAL | 1 refills | Status: DC | PRN

## 2016-05-22 MED ORDER — DOXYLAMINE-PYRIDOXINE 10-10 MG PO TBEC: DELAYED_RELEASE_TABLET | ORAL | 1 refills | Status: DC

## 2016-05-22 NOTE — Progress Notes (Signed)
Earney Malletasha D Carbon presents for NOB nurse interview visit. Pregnancy confirmation done 04/22/16 at [facility] ER, of which pt had abdominal pain. Has been seen here x2 for f/u. Ultrasound on 05/06/2016 resulted in 6.4 wk. Viable pregnancy. G-9.  [id number]. LMP is unknown, pt had ectopic and got pregnant after that. Pregnancy education material explained and given. No cats in the home. NOB labs ordered.  HIV labs and Drug screen were explained optional and she did not decline. Drug screen ordered. PNV encouraged. Genetic screening options discussed. Genetic testing: MaterniT is desired and will do on NOB PE visit or possibly before.   Pt may opt to discuss with provider. Pt. To follow up with provider in 3 weeks for NOB physical.  All questions answered. Pt states that she thinks she may have BV, of which she does have a history of. C/O vaginal discharge-has odor and chunky white and also c/o abdominal pain.  Per MNS, I gave pt a swab/tube to obtain a vaginal/cervical swab with instructions. Pt informed of dx of BV and the medications to take per MNS.

## 2016-05-22 NOTE — Patient Instructions (Signed)
Pregnancy and Zika Virus Disease Zika virus disease, or Zika, is an illness that can spread to people from mosquitoes that carry the virus. It may also spread from person to person through infected body fluids. Zika first occurred in [place], but recently it has spread to new areas. The virus occurs in tropical climates. The location of Zika continues to change. Most people who become infected with Zika virus do not develop serious illness. However, Zika may cause birth defects in an unborn baby whose mother is infected with the virus. It may also increase the risk of miscarriage. What are the symptoms of Zika virus disease? In many cases, people who have been infected with Zika virus do not develop any symptoms. If symptoms appear, they usually start about a week after the person is infected. Symptoms are usually mild. They may include:  Fever.  Rash.  Red eyes.  Joint pain. How does Zika virus disease spread? The main way that Zika virus spreads is through the bite of a certain type of mosquito. Unlike most types of mosquitos, which bite only at night, the type of mosquito that carries Zika virus bites both at night and during the day. Zika virus can also spread through sexual contact, through a blood transfusion, and from a mother to her baby before or during birth. Once you have had Zika virus disease, it is unlikely that you will get it again. Can I pass Zika to my baby during pregnancy? Yes, Zika can pass from a mother to her baby before or during birth. What problems can Zika cause for my baby? A woman who is infected with Zika virus while pregnant is at risk of having her baby born with a condition in which the brain or head is smaller than expected (microcephaly). Babies who have microcephaly can have developmental delays, seizures, hearing problems, and vision problems. Having Zika virus disease during pregnancy can also increase the risk of miscarriage. How can Zika virus disease be  prevented? There is no vaccine to prevent Zika. The best way to prevent the disease is to avoid infected mosquitoes and avoid exposure to body fluids that can spread the virus. Avoid any possible exposure to Zika by taking the following precautions. For women and their sex partners:  Avoid traveling to high-risk areas. The locations where Zika is being reported change often. To identify high-risk areas, check the CDC travel website: www.cdc.gov/zika/geo/index.html  If you or your sex partner must travel to a high-risk area, talk with a health care provider before and after traveling.  Take all precautions to avoid mosquito bites if you live in, or travel to, any of the high-risk areas. Insect repellents are safe to use during pregnancy.  Ask your health care provider when it is safe to have sexual contact. For women:  If you are pregnant or trying to become pregnant, avoid sexual contact with persons who may have been exposed to Zika virus, persons who have possible symptoms of Zika, or persons whose history you are unsure about. If you choose to have sexual contact with someone who may have been exposed to Zika virus, use condoms correctly during the entire duration of sexual activity, every time. Do not share sexual devices, as you may be exposed to body fluids.  Ask your health care provider about when it is safe to attempt pregnancy after a possible exposure to Zika virus. What steps should I take to avoid mosquito bites? Take these steps to avoid mosquito bites when you are   in a high-risk area:  Wear loose clothing that covers your arms and legs.  Limit your outdoor activities.  Do not open windows unless they have window screens.  Sleep under mosquito nets.  Use insect repellent. The best insect repellents have:  DEET, picaridin, oil of lemon eucalyptus (OLE), or IR3535 in them.  Higher amounts of an active ingredient in them.  Remember that insect repellents are safe to use  during pregnancy.  Do not use OLE on children who are younger than 3 years of age. Do not use insect repellent on babies who are younger than 2 months of age.  Cover your child's stroller with mosquito netting. Make sure the netting fits snugly and that any loose netting does not cover your child's mouth or nose. Do not use a blanket as a mosquito-protection cover.  Do not apply insect repellent underneath clothing.  If you are using sunscreen, apply the sunscreen before applying the insect repellent.  Treat clothing with permethrin. Do not apply permethrin directly to your skin. Follow label directions for safe use.  Get rid of standing water, where mosquitoes may reproduce. Standing water is often found in items such as buckets, bowls, animal food dishes, and flowerpots. When you return from traveling to any high-risk area, continue taking actions to protect yourself against mosquito bites for 3 weeks, even if you show no signs of illness. This will prevent spreading Zika virus to uninfected mosquitoes. What should I know about the sexual transmission of Zika? People can spread Zika to their sexual partners during vaginal, anal, or oral sex, or by sharing sexual devices. Many people with Zika do not develop symptoms, so a person could spread the disease without knowing that they are infected. The greatest risk is to women who are pregnant or who may become pregnant. Zika virus can live longer in semen than it can live in blood. Couples can prevent sexual transmission of the virus by:  Using condoms correctly during the entire duration of sexual activity, every time. This includes vaginal, anal, and oral sex.  Not sharing sexual devices. Sharing increases your risk of being exposed to body fluid from another person.  Avoiding all sexual activity until your health care provider says it is safe. Should I be tested for Zika virus? A sample of your blood can be tested for Zika virus. A pregnant  woman should be tested if she may have been exposed to the virus or if she has symptoms of Zika. She may also have additional tests done during her pregnancy, such ultrasound testing. Talk with your health care provider about which tests are recommended. This information is not intended to replace advice given to you by your health care provider. Make sure you discuss any questions you have with your health care provider. Document Released: 09/26/2014 Document Revised: 06/13/2015 Document Reviewed: 09/19/2014 Elsevier Interactive Patient Education  2017 [employer] Hyperemesis Gravidarum Hyperemesis gravidarum is a severe form of nausea and vomiting that happens during pregnancy. Hyperemesis is worse than morning sickness. It may cause you to have nausea or vomiting all day for many days. It may keep you from eating and drinking enough food and liquids. Hyperemesis usually occurs during the first half (the first 20 weeks) of pregnancy. It often goes away once a woman is in her second half of pregnancy. However, sometimes hyperemesis continues through an entire pregnancy. What are the causes? The cause of this condition is not known. It may be related to changes in chemicals (hormones)   in the body during pregnancy, such as the high level of pregnancy hormone (human chorionic gonadotropin) or the increase in the female sex hormone (estrogen). What are the signs or symptoms? Symptoms of this condition include:  Severe nausea and vomiting.  Nausea that does not go away.  Vomiting that does not allow you to keep any food down.  Weight loss.  Body fluid loss (dehydration).  Having no desire to eat, or not liking food that you have previously enjoyed. How is this diagnosed? This condition may be diagnosed based on:  A physical exam.  Your medical history.  Your symptoms.  Blood tests.  Urine tests. How is this treated? This condition may be managed with medicine. If medicines to do not  help relieve nausea and vomiting, you may need to receive fluids through an IV tube at the hospital. Follow these instructions at home:  Take over-the-counter and prescription medicines only as told by your health care provider.  Avoid iron pills and multivitamins that contain iron for the first 3-4 months of pregnancy. If you take prescription iron pills, do not stop taking them unless your health care provider approves.  Take the following actions to help prevent nausea and vomiting:  In the morning, before getting out of bed, try eating a couple of dry crackers or a piece of toast.  Avoid foods and smells that upset your stomach. Fatty and spicy foods may make nausea worse.  Eat 5-6 small meals a day.  Do not drink fluids while eating meals. Drink between meals.  Eat or suck on things that have ginger in them. Ginger can help relieve nausea.  Avoid food preparation. The smell of food can spoil your appetite or trigger nausea.  Follow instructions from your health care provider about eating or drinking restrictions.  For snacks, eat high-protein foods, such as cheese.  Keep all follow-up and pre-birth (prenatal) visits as told by your health care provider. This is important. Contact a health care provider if:  You have pain in your abdomen.  You have a severe headache.  You have vision problems.  You are losing weight. Get help right away if:  You cannot drink fluids without vomiting.  You vomit blood.  You have constant nausea and vomiting.  You are very weak.  You are very thirsty.  You feel dizzy.  You faint.  You have a fever or other symptoms that last for more than 2-3 days.  You have a fever and your symptoms suddenly get worse. Summary  Hyperemesis gravidarum is a severe form of nausea and vomiting that happens during pregnancy.  Making some changes to your eating habits may help relieve nausea and vomiting.  This condition may be managed with  medicine.  If medicines to do not help relieve nausea and vomiting, you may need to receive fluids through an IV tube at the hospital. This information is not intended to replace advice given to you by your health care provider. Make sure you discuss any questions you have with your health care provider. Document Released: 01/05/2005 Document Revised: 09/04/2015 Document Reviewed: 09/04/2015 Elsevier Interactive Patient Education  2017 [employer] First Trimester of Pregnancy The first trimester of pregnancy is from week 1 until the end of week 13 (months 1 through 3). During this time, your baby will begin to develop inside you. At 6-8 weeks, the eyes and face are formed, and the heartbeat can be seen on ultrasound. At the end of 12 weeks, all the baby's   organs are formed. Prenatal care is all the medical care you receive before the birth of your baby. Make sure you get good prenatal care and follow all of your doctor's instructions. Follow these instructions at home: Medicines   Take over-the-counter and prescription medicines only as told by your doctor. Some medicines are safe and some medicines are not safe during pregnancy.  Take a prenatal vitamin that contains at least 600 micrograms (mcg) of folic acid.  If you have trouble pooping (constipation), take medicine that will make your stool soft (stool softener) if your doctor approves. Eating and drinking   Eat regular, healthy meals.  Your doctor will tell you the amount of weight gain that is right for you.  Avoid raw meat and uncooked cheese.  If you feel sick to your stomach (nauseous) or throw up (vomit):  Eat 4 or 5 small meals a day instead of 3 large meals.  Try eating a few soda crackers.  Drink liquids between meals instead of during meals.  To prevent constipation:  Eat foods that are high in fiber, like fresh fruits and vegetables, whole grains, and beans.  Drink enough fluids to keep your pee (urine) clear or  pale yellow. Activity   Exercise only as told by your doctor. Stop exercising if you have cramps or pain in your lower belly (abdomen) or low back.  Do not exercise if it is too hot, too humid, or if you are in a place of great height (high altitude).  Try to avoid standing for long periods of time. Move your legs often if you must stand in one place for a long time.  Avoid heavy lifting.  Wear low-heeled shoes. Sit and stand up straight.  You can have sex unless your doctor tells you not to. Relieving pain and discomfort   Wear a good support bra if your breasts are sore.  Take warm water baths (sitz baths) to soothe pain or discomfort caused by hemorrhoids. Use hemorrhoid cream if your doctor says it is okay.  Rest with your legs raised if you have leg cramps or low back pain.  If you have puffy, bulging veins (varicose veins) in your legs:  Wear support hose or compression stockings as told by your doctor.  Raise (elevate) your feet for 15 minutes, 3-4 times a day.  Limit salt in your food. Prenatal care   Schedule your prenatal visits by the twelfth week of pregnancy.  Write down your questions. Take them to your prenatal visits.  Keep all your prenatal visits as told by your doctor. This is important. Safety   Wear your seat belt at all times when driving.  Make a list of emergency phone numbers. The list should include numbers for family, friends, the hospital, and police and fire departments. General instructions   Ask your doctor for a referral to a local prenatal class. Begin classes no later than at the start of month 6 of your pregnancy.  Ask for help if you need counseling or if you need help with nutrition. Your doctor can give you advice or tell you where to go for help.  Do not use hot tubs, steam rooms, or saunas.  Do not douche or use tampons or scented sanitary pads.  Do not cross your legs for long periods of time.  Avoid all herbs and alcohol.  Avoid drugs that are not approved by your doctor.  Do not use any tobacco products, including cigarettes, chewing tobacco, and electronic cigarettes.   If you need help quitting, ask your doctor. You may get counseling or other support to help you quit.  Avoid cat litter boxes and soil used by cats. These carry germs that can cause birth defects in the baby and can cause a loss of your baby (miscarriage) or stillbirth.  Visit your dentist. At home, brush your teeth with a soft toothbrush. Be gentle when you floss. Contact a doctor if:  You are dizzy.  You have mild cramps or pressure in your lower belly.  You have a nagging pain in your belly area.  You continue to feel sick to your stomach, you throw up, or you have watery poop (diarrhea).  You have a bad smelling fluid coming from your vagina.  You have pain when you pee (urinate).  You have increased puffiness (swelling) in your face, hands, legs, or ankles. Get help right away if:  You have a fever.  You are leaking fluid from your vagina.  You have spotting or bleeding from your vagina.  You have very bad belly cramping or pain.  You gain or lose weight rapidly.  You throw up blood. It may look like coffee grounds.  You are around people who have German measles, fifth disease, or chickenpox.  You have a very bad headache.  You have shortness of breath.  You have any kind of trauma, such as from a fall or a car accident. Summary  The first trimester of pregnancy is from week 1 until the end of week 13 (months 1 through 3).  To take care of yourself and your unborn baby, you will need to eat healthy meals, take medicines only if your doctor tells you to do so, and do activities that are safe for you and your baby.  Keep all follow-up visits as told by your doctor. This is important as your doctor will have to ensure that your baby is healthy and growing well. This information is not intended to replace advice given  to you by your health care provider. Make sure you discuss any questions you have with your health care provider. Document Released: 06/24/2007 Document Revised: 01/14/2016 Document Reviewed: 01/14/2016 Elsevier Interactive Patient Education  2017 [employer] Commonly Asked Questions During Pregnancy  Cats: A parasite can be excreted in cat feces.  To avoid exposure you need to have another person empty the little box.  If you must empty the litter box you will need to wear gloves.  Wash your hands after handling your cat.  This parasite can also be found in raw or undercooked meat so this should also be avoided.  Colds, Sore Throats, Flu: Please check your medication sheet to see what you can take for symptoms.  If your symptoms are unrelieved by these medications please call the office.  Dental Work: Most any dental work your dentist recommends is permitted.  X-rays should only be taken during the first trimester if absolutely necessary.  Your abdomen should be shielded with a lead apron during all x-rays.  Please notify your provider prior to receiving any x-rays.  Novocaine is fine; gas is not recommended.  If your dentist requires a note from us prior to dental work please call the office and we will provide one for you.  Exercise: Exercise is an important part of staying healthy during your pregnancy.  You may continue most exercises you were accustomed to prior to pregnancy.  Later in your pregnancy you will most likely notice you have difficulty with activities   requiring balance like riding a bicycle.  It is important that you listen to your body and avoid activities that put you at a higher risk of falling.  Adequate rest and staying well hydrated are a must!  If you have questions about the safety of specific activities ask your provider.    Exposure to Children with illness: Try to avoid obvious exposure; report any symptoms to us when noted,  If you have chicken pos, red measles or mumps,  you should be immune to these diseases.   Please do not take any vaccines while pregnant unless you have checked with your OB provider.  Fetal Movement: After 28 weeks we recommend you do "kick counts" twice daily.  Lie or sit down in a calm quiet environment and count your baby movements "kicks".  You should feel your baby at least 10 times per hour.  If you have not felt 10 kicks within the first hour get up, walk around and have something sweet to eat or drink then repeat for an additional hour.  If count remains less than 10 per hour notify your provider.  Fumigating: Follow your pest control agent's advice as to how long to stay out of your home.  Ventilate the area well before re-entering.  Hemorrhoids:   Most over-the-counter preparations can be used during pregnancy.  Check your medication to see what is safe to use.  It is important to use a stool softener or fiber in your diet and to drink lots of liquids.  If hemorrhoids seem to be getting worse please call the office.   Hot Tubs:  Hot tubs Jacuzzis and saunas are not recommended while pregnant.  These increase your internal body temperature and should be avoided.  Intercourse:  Sexual intercourse is safe during pregnancy as long as you are comfortable, unless otherwise advised by your provider.  Spotting may occur after intercourse; report any bright red bleeding that is heavier than spotting.  Labor:  If you know that you are in labor, please go to the hospital.  If you are unsure, please call the office and let us help you decide what to do.  Lifting, straining, etc:  If your job requires heavy lifting or straining please check with your provider for any limitations.  Generally, you should not lift items heavier than that you can lift simply with your hands and arms (no back muscles)  Painting:  Paint fumes do not harm your pregnancy, but may make you ill and should be avoided if possible.  Latex or water based paints have less odor  than oils.  Use adequate ventilation while painting.  Permanents & Hair Color:  Chemicals in hair dyes are not recommended as they cause increase hair dryness which can increase hair loss during pregnancy.  " Highlighting" and permanents are allowed.  Dye may be absorbed differently and permanents may not hold as well during pregnancy.  Sunbathing:  Use a sunscreen, as skin burns easily during pregnancy.  Drink plenty of fluids; avoid over heating.  Tanning Beds:  Because their possible side effects are still unknown, tanning beds are not recommended.  Ultrasound Scans:  Routine ultrasounds are performed at approximately 20 weeks.  You will be able to see your baby's general anatomy an if you would like to know the gender this can usually be determined as well.  If it is questionable when you conceived you may also receive an ultrasound early in your pregnancy for dating purposes.  Otherwise ultrasound exams   are not routinely performed unless there is a medical necessity.  Although you can request a scan we ask that you pay for it when conducted because insurance does not cover " patient request" scans.  Work: If your pregnancy proceeds without complications you may work until your due date, unless your physician or employer advises otherwise.  Round Ligament Pain/Pelvic Discomfort:  Sharp, shooting pains not associated with bleeding are fairly common, usually occurring in the second trimester of pregnancy.  They tend to be worse when standing up or when you remain standing for long periods of time.  These are the result of pressure of certain pelvic ligaments called "round ligaments".  Rest, Tylenol and heat seem to be the most effective relief.  As the womb and fetus grow, they rise out of the pelvis and the discomfort improves.  Please notify the office if your pain seems different than that described.  It may represent a more serious condition.   

## 2016-05-23 LAB — MONITOR DRUG PROFILE 14(MW)
AMPHETAMINE SCREEN URINE: NEGATIVE ng/mL
BARBITURATE SCREEN URINE: NEGATIVE ng/mL
BENZODIAZEPINE SCREEN, URINE: NEGATIVE ng/mL
Buprenorphine, Urine: NEGATIVE ng/mL
CANNABINOIDS UR QL SCN: NEGATIVE ng/mL
Cocaine (Metab) Scrn, Ur: NEGATIVE ng/mL
Creatinine(Crt), U: 191.9 mg/dL (ref 20.0–300.0)
Fentanyl, Urine: NEGATIVE pg/mL
Meperidine Screen, Urine: NEGATIVE ng/mL
Methadone Screen, Urine: NEGATIVE ng/mL
OXYCODONE+OXYMORPHONE UR QL SCN: NEGATIVE ng/mL
Opiate Scrn, Ur: NEGATIVE ng/mL
PH UR, DRUG SCRN: 5.9 (ref 4.5–8.9)
PROPOXYPHENE SCREEN URINE: NEGATIVE ng/mL
Phencyclidine Qn, Ur: NEGATIVE ng/mL
SPECIFIC GRAVITY: 1.022
Tramadol Screen, Urine: NEGATIVE ng/mL

## 2016-05-23 LAB — URINALYSIS, ROUTINE W REFLEX MICROSCOPIC
BILIRUBIN UA: NEGATIVE
GLUCOSE, UA: NEGATIVE
KETONES UA: NEGATIVE
Nitrite, UA: NEGATIVE
Protein, UA: NEGATIVE
RBC UA: NEGATIVE
SPEC GRAV UA: 1.024 (ref 1.005–1.030)
UUROB: 0.2 mg/dL (ref 0.2–1.0)
pH, UA: 6 (ref 5.0–7.5)

## 2016-05-23 LAB — MICROSCOPIC EXAMINATION
Casts: NONE SEEN /lpf
Epithelial Cells (non renal): >10 /hpf — AB (ref 0–10)

## 2016-05-23 LAB — NICOTINE SCREEN, URINE: COTININE UR QL SCN: POSITIVE ng/mL

## 2016-05-24 LAB — CULTURE, OB URINE

## 2016-05-24 LAB — URINE CULTURE, OB REFLEX

## 2016-05-25 ENCOUNTER — Other Ambulatory Visit: Payer: Self-pay | Admitting: Certified Nurse Midwife

## 2016-05-25 ENCOUNTER — Encounter: Payer: Self-pay | Admitting: Certified Nurse Midwife

## 2016-05-25 LAB — GC/CHLAMYDIA PROBE AMP
CHLAMYDIA, DNA PROBE: POSITIVE — AB
Neisseria gonorrhoeae by PCR: NEGATIVE

## 2016-05-25 MED ORDER — AZITHROMYCIN 500 MG PO TABS: 1000.0000 mg | ORAL_TABLET | Freq: Once | ORAL | 0 refills | Status: AC

## 2016-05-25 NOTE — Progress Notes (Signed)
Diamond Sutton tested positive for chlamydia in her new ob labs. Order sent to pharmacy and message sent to my chart

## 2016-05-26 LAB — CBC WITH DIFFERENTIAL/PLATELET
BASOS: 0 %
Basophils Absolute: 0 10*3/uL (ref 0.0–0.2)
EOS (ABSOLUTE): 0.1 10*3/uL (ref 0.0–0.4)
Eos: 1 %
HEMATOCRIT: 34.5 % (ref 34.0–46.6)
Hemoglobin: 11.6 g/dL (ref 11.1–15.9)
IMMATURE GRANS (ABS): 0 10*3/uL (ref 0.0–0.1)
IMMATURE GRANULOCYTES: 0 %
Lymphocytes Absolute: 2.2 10*3/uL (ref 0.7–3.1)
Lymphs: 20 %
MCH: 30.1 pg (ref 26.6–33.0)
MCHC: 33.6 g/dL (ref 31.5–35.7)
MCV: 89 fL (ref 79–97)
MONOS ABS: 0.9 10*3/uL (ref 0.1–0.9)
Monocytes: 8 %
NEUTROS ABS: 7.8 10*3/uL — AB (ref 1.4–7.0)
NEUTROS PCT: 71 %
PLATELETS: 317 10*3/uL (ref 150–379)
RBC: 3.86 x10E6/uL (ref 3.77–5.28)
RDW: 13.5 % (ref 12.3–15.4)
WBC: 11.1 10*3/uL — ABNORMAL HIGH (ref 3.4–10.8)

## 2016-05-26 LAB — RPR: RPR: NONREACTIVE

## 2016-05-26 LAB — HIV ANTIBODY (ROUTINE TESTING W REFLEX): HIV Screen 4th Generation wRfx: NONREACTIVE

## 2016-05-26 LAB — RH TYPE: RH TYPE: POSITIVE

## 2016-05-26 LAB — SICKLE CELL SCREEN: Sickle Cell Screen: NEGATIVE

## 2016-05-26 LAB — VARICELLA ZOSTER ANTIBODY, IGG: VARICELLA: 575 {index} (ref >165)

## 2016-05-26 LAB — ABO

## 2016-05-26 LAB — HEPATITIS B SURFACE ANTIGEN: Hepatitis B Surface Ag: NEGATIVE

## 2016-05-26 LAB — RUBELLA SCREEN: Rubella Antibodies, IGG: 1.88 index (ref >0.99)

## 2016-05-26 LAB — ANTIBODY SCREEN: Antibody Screen: NEGATIVE

## 2016-06-16 ENCOUNTER — Encounter: Payer: Self-pay | Admitting: Certified Nurse Midwife

## 2016-06-17 ENCOUNTER — Ambulatory Visit (INDEPENDENT_AMBULATORY_CARE_PROVIDER_SITE_OTHER): Payer: Medicaid Other | Admitting: Certified Nurse Midwife

## 2016-06-17 ENCOUNTER — Encounter: Payer: Self-pay | Admitting: Certified Nurse Midwife

## 2016-06-17 VITALS — BP 119/82 | HR 92 | Wt 156.5 lb

## 2016-06-17 DIAGNOSIS — Z3481 Encounter for supervision of other normal pregnancy, first trimester: Secondary | ICD-10-CM

## 2016-06-17 DIAGNOSIS — Z8619 Personal history of other infectious and parasitic diseases: Secondary | ICD-10-CM | POA: Diagnosis not present

## 2016-06-17 DIAGNOSIS — Z8742 Personal history of other diseases of the female genital tract: Secondary | ICD-10-CM

## 2016-06-17 LAB — POCT URINALYSIS DIPSTICK
BILIRUBIN UA: NEGATIVE
Glucose, UA: NEGATIVE
KETONES UA: NEGATIVE
Leukocytes, UA: NEGATIVE
Nitrite, UA: NEGATIVE
PH UA: 7 (ref 5.0–8.0)
Protein, UA: NEGATIVE
RBC UA: NEGATIVE
SPEC GRAV UA: 1.01 (ref 1.010–1.025)
Urobilinogen, UA: 0.2 E.U./dL

## 2016-06-17 NOTE — Progress Notes (Signed)
NEW OB HISTORY AND PHYSICAL  SUBJECTIVE:       Diamond Sutton is a 31 y.o. [phone] female, No LMP recorded (lmp unknown). Patient is pregnant., Estimated Date of Delivery: 12/26/16, [redacted]w[redacted]d, presents today for establishment of Prenatal Care. She has  complains of breast tenderness      Gynecologic History No LMP recorded (lmp unknown). Patient is pregnant. Normal Contraception: none Last Pap: 2017. Results were: normal  Obstetric History OB History  Gravida Para Term Preterm AB Living  9 6 5 1 2 6   SAB TAB Ectopic Multiple Live Births  1 0 1 0 6    # Outcome Date GA Lbr Len/2nd Weight Sex Delivery Anes PTL Lv  9 Current           8 Ectopic 2018          7 Preterm 12/30/14 [redacted]w[redacted]d  6 lb 0.7 oz (2.74 kg) M Vag-Spont EPI  LIV  6 Term 09/11/12 [redacted]w[redacted]d  7 lb 5 oz (3.317 kg) M Vag-Spont   LIV  5 Term 08/05/09 [redacted]w[redacted]d  6 lb 14 oz (3.118 kg) F Vag-Spont   LIV  4 Term 03/19/07 [redacted]w[redacted]d  7 lb 7 oz (3.374 kg) M Vag-Spont   LIV  3 Term 05/05/05 [redacted]w[redacted]d  6 lb 11 oz (3.033 kg) M Vag-Spont   LIV  2 SAB 2006 [id number]    SAB     1 Term 05/05/02 [redacted]w[redacted]d  6 lb 13 oz (3.09 kg) M Vag-Spont   LIV      Past Medical History:  Diagnosis Date  . Depression 2001  . Ectopic pregnancy   . Ovarian cyst     Past Surgical History:  Procedure Laterality Date  . DILATION AND CURETTAGE OF UTERUS     SAB    Current Outpatient Prescriptions on File Prior to Visit  Medication Sig Dispense Refill  . Doxylamine-Pyridoxine 10-10 MG TBEC Day 1 & 2: Take 2 tablets at bedtime on an empty stomach. Day 3: If symptoms persist take 1 tablet in the morning and 2 tables at bedtime. Day 4: If symptoms persist take 1 tablet in the morning, 1 tablet mid-afternoon and 2 tablets at bedtime. Max Dose: 4 tablets a day. Pregnancy Category A 120 tablet 1  . metroNIDAZOLE (FLAGYL) 500 MG tablet Take 1 tablet (500 mg total) by mouth 2 (two) times daily. (Patient not taking: Reported on 06/17/2016) 14 tablet 0  . Prenatal Vit-Fe  Fumarate-FA (MULTIVITAMIN-PRENATAL) 27-0.8 MG TABS tablet Take 1 tablet by mouth daily at 12 noon.     . promethazine (PHENERGAN) 25 MG tablet Take 1 tablet (25 mg total) by mouth every 8 (eight) hours as needed for nausea or vomiting. Take 1/2 tab before taking each dose of Flagyl. 30 tablet 1   No current facility-administered medications on file prior to visit.     Allergies  Allergen Reactions  . Penicillins Anaphylaxis    12/2014: received ancef w/o complications  Has patient had a PCN reaction causing immediate rash, facial/tongue/throat swelling, SOB or lightheadedness with hypotension: Yes Has patient had a PCN reaction causing severe rash involving mucus membranes or skin necrosis: Yes Has patient had a PCN reaction that required hospitalization Yes Has patient had a PCN reaction occurring within the last 10 years: No If all of the above answers are "NO", then may proceed with Cephalosporin use.     Social History   Social History  . Marital status: Single    Spouse name: N/A  .  Number of children: N/A  . Years of education: N/A   Occupational History  . Not on file.   Social History Main Topics  . Smoking status: Former Smoker    Years: 15.00    Types: Cigarettes    Quit date: 05/08/2016  . Smokeless tobacco: Never Used     Comment: pt states she quit when she found out she was pregnant.   . Alcohol use No     Comment: drinks on weekends  . Drug use: No  . Sexual activity: Yes    Birth control/ protection: None   Other Topics Concern  . Not on file   Social History Narrative  . No narrative on file   She states that she recently started smoking again after her mother passed away and had one drink due to stress. Counseled on risks and encouraged her to stop.   Family History  Problem Relation Age of Onset  . Diabetes Mother   . Breast cancer Neg Hx   . Ovarian cancer Neg Hx   . Colon cancer Neg Hx   . Heart disease Neg Hx     The following  portions of the patient's history were reviewed and updated as appropriate: allergies, current medications, past OB history, past medical history, past surgical history, past family history, past social history, and problem list.    OBJECTIVE: Initial Physical Exam (New OB)  GENERAL APPEARANCE: alert, well appearing, in no apparent distress, oriented to person, place and time HEAD: normocephalic, atraumatic MOUTH: mucous membranes moist, pharynx normal without lesions THYROID: no thyromegaly or masses present BREASTS: no masses noted, no significant tenderness, no palpable axillary nodes, no skin changes LUNGS: clear to auscultation, no wheezes, rales or rhonchi, symmetric air entry HEART: regular rate and rhythm, no murmurs ABDOMEN: soft, nontender, nondistended, no abnormal masses, no epigastric pain EXTREMITIES: no redness or tenderness in the calves or thighs SKIN: normal coloration and turgor, no rashes LYMPH NODES: no adenopathy palpable NEUROLOGIC: alert, oriented, normal speech, no focal findings or movement disorder noted  PELVIC EXAM EXTERNAL GENITALIA: normal appearing vulva with no masses, tenderness or lesions VAGINA: discharge white particulate  CERVIX: no lesions or cervical motion tenderness UTERUS: gravid ADNEXA: no masses palpable and nontender OB EXAM PELVIMETRY: appears adequate RECTUM: exam not indicated  ASSESSMENT: Normal pregnancy  PLAN: New OB counseling: The patient has been given an overview regarding routine prenatal care. Recommendations regarding diet, weight gain, medications, and exercise in pregnancy were given. Prenatal testing, optional genetic testing, and ultrasound use in pregnancy were reviewed. Requesting Maternit. Benefits of Breast Feeding were discussed. The patient is encouraged to consider nursing her baby post partum. NuSwab today for test of cure and BV/yeast. Maternit today. Will follow up with results. ROB in 4 wks.  Murlean CallerAnnie Thopson,  CNM

## 2016-06-17 NOTE — Patient Instructions (Signed)
How a Baby Grows During Pregnancy Pregnancy begins when a female's sperm enters a female's egg (fertilization). This happens in one of the tubes (fallopian tubes) that connect the ovaries to the womb (uterus). The fertilized egg is called an embryo until it reaches 10 weeks. From 10 weeks until birth, it is called a fetus. The fertilized egg moves down the fallopian tube to the uterus. Then it implants into the lining of the uterus and begins to grow. The developing fetus receives oxygen and nutrients through the pregnant woman's bloodstream and the tissues that grow (placenta) to support the fetus. The placenta is the life support system for the fetus. It provides nutrition and removes waste. Learning as much as you can about your pregnancy and how your baby is developing can help you enjoy the experience. It can also make you aware of when there might be a problem and when to ask questions. How long does a typical pregnancy last? A pregnancy usually lasts 280 days, or about 40 weeks. Pregnancy is divided into three trimesters:  First trimester: 0-13 weeks.  Second trimester: 14-27 weeks.  Third trimester: 28-40 weeks. The day when your baby is considered ready to be born (full term) is your estimated date of delivery. How does my baby develop month by month? First month  The fertilized egg attaches to the inside of the uterus.  Some cells will form the placenta. Others will form the fetus.  The arms, legs, brain, spinal cord, lungs, and heart begin to develop.  At the end of the first month, the heart begins to beat. Second month  The bones, inner ear, eyelids, hands, and feet form.  The genitals develop.  By the end of 8 weeks, all major organs are developing. Third month  All of the internal organs are forming.  Teeth develop below the gums.  Bones and muscles begin to grow. The spine can flex.  The skin is transparent.  Fingernails and toenails begin to form.  Arms and  legs continue to grow longer, and hands and feet develop.  The fetus is about 3 in (7.6 cm) long. Fourth month  The placenta is completely formed.  The external sex organs, neck, outer ear, eyebrows, eyelids, and fingernails are formed.  The fetus can hear, swallow, and move its arms and legs.  The kidneys begin to produce urine.  The skin is covered with a white waxy coating (vernix) and very fine hair (lanugo). Fifth month  The fetus moves around more and can be felt for the first time (quickening).  The fetus starts to sleep and wake up and may begin to suck its finger.  The nails grow to the end of the fingers.  The organ in the digestive system that makes bile (gallbladder) functions and helps to digest the nutrients.  If your baby is a girl, eggs are present in her ovaries. If your baby is a boy, testicles start to move down into his scrotum. Sixth month  The lungs are formed, but the fetus is not yet able to breathe.  The eyes open. The brain continues to develop.  Your baby has fingerprints and toe prints. Your baby's hair grows thicker.  At the end of the second trimester, the fetus is about 9 in (22.9 cm) long. Seventh month  The fetus kicks and stretches.  The eyes are developed enough to sense changes in light.  The hands can make a grasping motion.  The fetus responds to sound. Eighth month    All organs and body systems are fully developed and functioning.  Bones harden and taste buds develop. The fetus may hiccup.  Certain areas of the brain are still developing. The skull remains soft. Ninth month  The fetus gains about  lb (0.23 kg) each week.  The lungs are fully developed.  Patterns of sleep develop.  The fetus's head typically moves into a head-down position (vertex) in the uterus to prepare for birth. If the buttocks move into a vertex position instead, the baby is breech.  The fetus weighs 6-9 lbs (2.72-4.08 kg) and is 19-20 in  (48.26-50.8 cm) long. What can I do to have a healthy pregnancy and help my baby develop?  Eating and Drinking  Eat a healthy diet.  Talk with your health care provider to make sure that you are getting the nutrients that you and your baby need.  Visit www.choosemyplate.gov to learn about creating a healthy diet.  Gain a healthy amount of weight during pregnancy as advised by your health care provider. This is usually 25-35 pounds. You may need to:  Gain more if you were underweight before getting pregnant or if you are pregnant with more than one baby.  Gain less if you were overweight or obese when you got pregnant. Medicines and Vitamins  Take prenatal vitamins as directed by your health care provider. These include vitamins such as folic acid, iron, calcium, and vitamin D. They are important for healthy development.  Take medicines only as directed by your health care provider. Read labels and ask a pharmacist or your health care provider whether over-the-counter medicines, supplements, and prescription drugs are safe to take during pregnancy. Activities  Be physically active as advised by your health care provider. Ask your health care provider to recommend activities that are safe for you to do, such as walking or swimming.  Do not participate in strenuous or extreme sports. Lifestyle  Do not drink alcohol.  Do not use any tobacco products, including cigarettes, chewing tobacco, or electronic cigarettes. If you need help quitting, ask your health care provider.  Do not use illegal drugs. Safety  Avoid exposure to mercury, lead, or other heavy metals. Ask your health care provider about common sources of these heavy metals.  Avoid listeria infection during pregnancy. Follow these precautions:  Do not eat soft cheeses or deli meats.  Do not eat hot dogs unless they have been warmed up to the point of steaming, such as in the microwave oven.  Do not drink unpasteurized  milk.  Avoid toxoplasmosis infection during pregnancy. Follow these precautions:  Do not change your cat's litter box, if you have a cat. Ask someone else to do this for you.  Wear gardening gloves while working in the yard. General Instructions  Keep all follow-up visits as directed by your health care provider. This is important. This includes prenatal care and screening tests.  Manage any chronic health conditions. Work closely with your health care provider to keep conditions, such as diabetes, under control. How do I know if my baby is developing well? At each prenatal visit, your health care provider will do several different tests to check on your health and keep track of your baby's development. These include:  Fundal height.  Your health care provider will measure your growing belly from top to bottom using a tape measure.  Your health care provider will also feel your belly to determine your baby's position.  Heartbeat.  An ultrasound in the first trimester can   confirm pregnancy and show a heartbeat, depending on how far along you are.  Your health care provider will check your baby's heart rate at every prenatal visit.  As you get closer to your delivery date, you may have regular fetal heart rate monitoring to make sure that your baby is not in distress.  Second trimester ultrasound.  This ultrasound checks your baby's development. It also indicates your baby's gender. What should I do if I have concerns about my baby's development? Always talk with your health care provider about any concerns that you may have. This information is not intended to replace advice given to you by your health care provider. Make sure you discuss any questions you have with your health care provider. Document Released: 06/24/2007 Document Revised: 06/13/2015 Document Reviewed: 06/14/2013 Elsevier Interactive Patient Education  2017 [employer] Inc[employer]  

## 2016-06-17 NOTE — Progress Notes (Signed)
Pt.'s mother past away on Friday. She was 31 yo. Pt was told by family member that they had found something in her lungs but pt was unaware.

## 2016-06-19 LAB — NUSWAB VAGINITIS PLUS (VG+)
CANDIDA GLABRATA, NAA: NEGATIVE
Candida albicans, NAA: NEGATIVE
Chlamydia trachomatis, NAA: NEGATIVE
Neisseria gonorrhoeae, NAA: NEGATIVE
Trich vag by NAA: NEGATIVE

## 2016-06-23 ENCOUNTER — Encounter: Payer: Self-pay | Admitting: Certified Nurse Midwife

## 2016-06-23 ENCOUNTER — Telehealth: Payer: Self-pay | Admitting: Certified Nurse Midwife

## 2016-06-23 LAB — MATERNIT 21 PLUS CORE, BLOOD
CHROMOSOME 18: NEGATIVE
Chromosome 13: NEGATIVE
Chromosome 21: NEGATIVE
Y CHROMOSOME: DETECTED

## 2016-06-23 NOTE — Telephone Encounter (Signed)
Please call patient with the genetic results, she wants to discuss them with you.

## 2016-06-23 NOTE — Telephone Encounter (Signed)
Pattricia Bossnnie could you please look at her Maternit T and please advise about results

## 2016-07-16 ENCOUNTER — Encounter: Payer: Self-pay | Admitting: Certified Nurse Midwife

## 2016-07-20 ENCOUNTER — Ambulatory Visit (INDEPENDENT_AMBULATORY_CARE_PROVIDER_SITE_OTHER): Payer: Medicaid Other | Admitting: Certified Nurse Midwife

## 2016-07-20 ENCOUNTER — Encounter: Payer: Self-pay | Admitting: Certified Nurse Midwife

## 2016-07-20 VITALS — BP 104/58 | HR 81 | Wt 155.8 lb

## 2016-07-20 DIAGNOSIS — Z3482 Encounter for supervision of other normal pregnancy, second trimester: Secondary | ICD-10-CM

## 2016-07-20 DIAGNOSIS — N898 Other specified noninflammatory disorders of vagina: Secondary | ICD-10-CM

## 2016-07-20 DIAGNOSIS — Z3689 Encounter for other specified antenatal screening: Secondary | ICD-10-CM

## 2016-07-20 DIAGNOSIS — Z8619 Personal history of other infectious and parasitic diseases: Secondary | ICD-10-CM

## 2016-07-20 LAB — POCT URINALYSIS DIPSTICK
Blood, UA: NEGATIVE
Glucose, UA: NEGATIVE
KETONES UA: NEGATIVE
Nitrite, UA: NEGATIVE
PH UA: 6 (ref 5.0–8.0)
SPEC GRAV UA: 1.02 (ref 1.010–1.025)
Urobilinogen, UA: 0.2 E.U./dL

## 2016-07-20 NOTE — Progress Notes (Signed)
Diamond Sutton-Pt doing well, reports lower abdominal cramping and yellowish green vaginal discharge. She is concerned that her partner "is lying and might not have been treated for past STI". Request STI testing today. Pelvic exam: VULVA: normal appearing vulva with no masses, tenderness or lesions, VAGINA: DNA probe for chlamydia and GC obtained, CERVIX: normal appearing cervix without discharge or lesions. NuSwav collected, will contact patient via MyChart with results. Discussed red flag symptoms and when to call. RTC x 3 weeks for anatomy scan and Diamond Sutton.

## 2016-07-20 NOTE — Progress Notes (Signed)
ROB- c/o of yellow/green d/c. Lower pelvic pain. Wants ch/gc. H/o ch with neg toc on 05/2016. Unsure if partner got treated. Will test today.

## 2016-07-20 NOTE — Patient Instructions (Signed)

## 2016-07-22 LAB — CHLAMYDIA/GONOCOCCUS/TRICHOMONAS, NAA
Chlamydia by NAA: POSITIVE — AB
Gonococcus by NAA: NEGATIVE
Trich vag by NAA: NEGATIVE

## 2016-07-23 ENCOUNTER — Other Ambulatory Visit: Payer: Self-pay | Admitting: Certified Nurse Midwife

## 2016-07-23 DIAGNOSIS — O98812 Other maternal infectious and parasitic diseases complicating pregnancy, second trimester: Principal | ICD-10-CM

## 2016-07-23 DIAGNOSIS — A749 Chlamydial infection, unspecified: Secondary | ICD-10-CM

## 2016-07-23 MED ORDER — AZITHROMYCIN 500 MG PO TABS: 1000.0000 mg | ORAL_TABLET | Freq: Once | ORAL | 1 refills | Status: AC

## 2016-07-24 ENCOUNTER — Telehealth: Payer: Self-pay | Admitting: Obstetrics and Gynecology

## 2016-07-24 NOTE — Telephone Encounter (Signed)
Patient called stating she received a message on MyChart stating her prescription would be ready today 07/24/2016. Patient would like to be called back and also like to know when she will be able to pick up her medication to treat her STD. Please advise.

## 2016-07-24 NOTE — Telephone Encounter (Signed)
Notified pt rx is at pharmacy.

## 2016-08-11 ENCOUNTER — Ambulatory Visit (INDEPENDENT_AMBULATORY_CARE_PROVIDER_SITE_OTHER): Payer: Medicaid Other | Admitting: Obstetrics and Gynecology

## 2016-08-11 ENCOUNTER — Ambulatory Visit (INDEPENDENT_AMBULATORY_CARE_PROVIDER_SITE_OTHER): Payer: Medicaid Other

## 2016-08-11 VITALS — BP 110/76 | HR 79 | Wt 161.3 lb

## 2016-08-11 DIAGNOSIS — Z3689 Encounter for other specified antenatal screening: Secondary | ICD-10-CM

## 2016-08-11 DIAGNOSIS — Z3492 Encounter for supervision of normal pregnancy, unspecified, second trimester: Secondary | ICD-10-CM

## 2016-08-11 DIAGNOSIS — Z3482 Encounter for supervision of other normal pregnancy, second trimester: Secondary | ICD-10-CM

## 2016-08-11 DIAGNOSIS — B379 Candidiasis, unspecified: Secondary | ICD-10-CM

## 2016-08-11 LAB — POCT URINALYSIS DIPSTICK
BILIRUBIN UA: NEGATIVE
Glucose, UA: NEGATIVE
Ketones, UA: NEGATIVE
Nitrite, UA: NEGATIVE
PH UA: 6.5 (ref 5.0–8.0)
Protein, UA: NEGATIVE
RBC UA: NEGATIVE
SPEC GRAV UA: 1.01 (ref 1.010–1.025)
UROBILINOGEN UA: 0.2 U/dL

## 2016-08-11 MED ORDER — FLUCONAZOLE 150 MG PO TABS: 150.0000 mg | ORAL_TABLET | Freq: Once | ORAL | 3 refills | Status: AC

## 2016-08-11 MED ORDER — PRENATAL 27-0.8 MG PO TABS: 1.0000 | ORAL_TABLET | Freq: Every day | ORAL | 4 refills | Status: DC

## 2016-08-11 NOTE — Progress Notes (Signed)
ROB- pt is c/o R hip pain, also vaginal itching

## 2016-08-11 NOTE — Progress Notes (Signed)
ROB & anatomy-reports yeast like symptoms x 1 week, states she gets them a lot during pregnancy, Microscopic wet-mount exam shows negative for pathogens, normal epithelial cells, lactobacilli rx for diflucan sent in, and refilled PNV.  Indications:Anatomy U/S Findings:  Singleton intrauterine pregnancy is visualized with FHR at 149 BPM. Biometrics give an (U/S) Gestational age of [redacted] weeks and an (U/S) EDD of 12/22/16; this correlates with the clinically established EDD of 12/24/16.  Fetal presentation is Breech.  EFW: 398g (14 oz). Placenta: Posterior, grade 0, 7.8 cm from internal os, central cord insert. AFI: Adequate with MVP of 5.7 cm.  Anatomic survey is complete and normal; Gender - female  .   Ovaries are not seen Survey of the adnexa demonstrates no adnexal masses. There is no free peritoneal fluid in the cul de sac.  Impression: 1. 21 week Viable Singleton Intrauterine pregnancy by U/S. 2. (U/S) EDD is consistent with Clinically established (LMP) EDD of 12/24/16. 3. Normal Anatomy Scan

## 2016-08-13 LAB — GC/CHLAMYDIA PROBE AMP
CHLAMYDIA, DNA PROBE: NEGATIVE
NEISSERIA GONORRHOEAE BY PCR: NEGATIVE

## 2016-09-08 ENCOUNTER — Encounter: Payer: Self-pay | Admitting: Certified Nurse Midwife

## 2016-09-09 ENCOUNTER — Telehealth: Payer: Self-pay | Admitting: Obstetrics and Gynecology

## 2016-09-09 ENCOUNTER — Encounter: Payer: Self-pay | Admitting: Certified Nurse Midwife

## 2016-09-09 NOTE — Telephone Encounter (Signed)
Patient called stating she had intercourse with "babydaddy" because he told her ha got treated from the last STI. She is now experiencing stomach pain, discharge and odor. Please Advise

## 2016-09-10 ENCOUNTER — Encounter: Payer: Self-pay | Admitting: Obstetrics and Gynecology

## 2016-09-11 ENCOUNTER — Ambulatory Visit (INDEPENDENT_AMBULATORY_CARE_PROVIDER_SITE_OTHER): Payer: Medicaid Other | Admitting: Certified Nurse Midwife

## 2016-09-11 ENCOUNTER — Encounter: Payer: Self-pay | Admitting: Certified Nurse Midwife

## 2016-09-11 VITALS — BP 113/63 | HR 83 | Wt 166.4 lb

## 2016-09-11 DIAGNOSIS — Z202 Contact with and (suspected) exposure to infections with a predominantly sexual mode of transmission: Secondary | ICD-10-CM

## 2016-09-11 DIAGNOSIS — Z3482 Encounter for supervision of other normal pregnancy, second trimester: Secondary | ICD-10-CM

## 2016-09-11 NOTE — Progress Notes (Signed)
Pt is here with c/o same symptoms of chlamydia. States she doesn't think FOB took the medicine.

## 2016-09-11 NOTE — Progress Notes (Signed)
ROB, doing well. Pt request to be tested today for GC/Chlamydia states that she does not know if her partner took his medication and she has been sexually active. She states that she is having similar symptoms of discharge and some abdominal pain. She denies PTL contractions stating " I know what that feels like". She states that this comfort is less than 6 x hr. NuSwab collected today. Follow up in 3-4 wks for 1 hr GTT. TDAP, BTC, and CBC. She agrees to plan. Will follow up with results.   Jerrol Banana CNM

## 2016-09-11 NOTE — Patient Instructions (Signed)
Glucose Tolerance Test During Pregnancy The glucose tolerance test (GTT) is a blood test used to determine if you have developed a type of diabetes during pregnancy (gestational diabetes). This is when your body does not properly process sugar (glucose) in the food you eat, resulting in high blood glucose levels. Typically, a GTT is done after you have had a 1-hour glucose test with results that indicate you possibly have gestational diabetes. It may also be done if:  You have a history of giving birth to very large babies or have experienced repeated fetal loss (stillbirth).  You have signs and symptoms of diabetes, such as: ? Changes in your vision. ? Tingling or numbness in your hands or feet. ? Changes in hunger, thirst, and urination not otherwise explained by your pregnancy.  The GTT lasts about 3 hours. You will be given a sugar-water solution to drink at the beginning of the test. You will have blood drawn before you drink the solution and then again 1, 2, and 3 hours after you drink it. You will not be allowed to eat or drink anything else during the test. You must remain at the testing location to make sure that your blood is drawn on time. You should also avoid exercising during the test, because exercise can alter test results. How do I prepare for this test? Eat normally for 3 days prior to the GTT test, including having plenty of carbohydrate-rich foods. Do not eat or drink anything except water during the final 12 hours before the test. In addition, your health care provider may ask you to stop taking certain medicines before the test. What do the results mean? It is your responsibility to obtain your test results. Ask the lab or department performing the test when and how you will get your results. Contact your health care provider to discuss any questions you have about your results. Range of Normal Values Ranges for normal values may vary among different labs and hospitals. You  should always check with your health care provider after having lab work or other tests done to discuss whether your values are considered within normal limits. Normal levels of blood glucose are as follows:  Fasting: less than 105 mg/dL.  1 hour after drinking the solution: less than 190 mg/dL.  2 hours after drinking the solution: less than 165 mg/dL.  3 hours after drinking the solution: less than 145 mg/dL.  Some substances can interfere with GTT results. These may include:  Blood pressure and heart failure medicines, including beta blockers, furosemide, and thiazides.  Anti-inflammatory medicines, including aspirin.  Nicotine.  Some psychiatric medicines.  Meaning of Results Outside Normal Value Ranges GTT test results that are above normal values may indicate a number of health problems, such as:  Gestational diabetes.  Acute stress response.  Cushing syndrome.  Tumors such as pheochromocytoma or glucagonoma.  Long-term kidney problems.  Pancreatitis.  Hyperthyroidism.  Current infection.  Discuss your test results with your health care provider. He or she will use the results to make a diagnosis and determine a treatment plan that is right for you. This information is not intended to replace advice given to you by your health care provider. Make sure you discuss any questions you have with your health care provider. Document Released: 07/07/2011 Document Revised: 06/13/2015 Document Reviewed: 05/12/2013 Elsevier Interactive Patient Education  2017 [employer] Inc. Round Ligament Pain The round ligament is a cord of muscle and tissue that helps to support the uterus. It can  become a source of pain during pregnancy if it becomes stretched or twisted as the baby grows. The pain usually begins in the second trimester of pregnancy, and it can come and go until the baby is delivered. It is not a serious problem, and it does not cause harm to the baby. Round ligament pain  is usually a short, sharp, and pinching pain, but it can also be a dull, lingering, and aching pain. The pain is felt in the lower side of the abdomen or in the groin. It usually starts deep in the groin and moves up to the outside of the hip area. Pain can occur with:  A sudden change in position.  Rolling over in bed.  Coughing or sneezing.  Physical activity.  Follow these instructions at home: Watch your condition for any changes. Take these steps to help with your pain:  When the pain starts, relax. Then try: ? Sitting down. ? Flexing your knees up to your abdomen. ? Lying on your side with one pillow under your abdomen and another pillow between your legs. ? Sitting in a warm bath for 15-20 minutes or until the pain goes away.  Take over-the-counter and prescription medicines only as told by your health care provider.  Move slowly when you sit and stand.  Avoid long walks if they cause pain.  Stop or lessen your physical activities if they cause pain.  Contact a health care provider if:  Your pain does not go away with treatment.  You feel pain in your back that you did not have before.  Your medicine is not helping. Get help right away if:  You develop a fever or chills.  You develop uterine contractions.  You develop vaginal bleeding.  You develop nausea or vomiting.  You develop diarrhea.  You have pain when you urinate. This information is not intended to replace advice given to you by your health care provider. Make sure you discuss any questions you have with your health care provider. Document Released: 10/15/2007 Document Revised: 06/13/2015 Document Reviewed: 03/14/2014 Elsevier Interactive Patient Education  [employer].

## 2016-09-15 ENCOUNTER — Encounter: Payer: Self-pay | Admitting: Certified Nurse Midwife

## 2016-09-15 LAB — NUSWAB VAGINITIS PLUS (VG+)
CHLAMYDIA TRACHOMATIS, NAA: NEGATIVE
Candida albicans, NAA: NEGATIVE
Candida glabrata, NAA: NEGATIVE
NEISSERIA GONORRHOEAE, NAA: NEGATIVE
TRICH VAG BY NAA: NEGATIVE

## 2016-10-05 ENCOUNTER — Ambulatory Visit (INDEPENDENT_AMBULATORY_CARE_PROVIDER_SITE_OTHER): Payer: Medicaid Other | Admitting: Certified Nurse Midwife

## 2016-10-05 VITALS — BP 127/75 | HR 99 | Wt 166.9 lb

## 2016-10-05 DIAGNOSIS — O99891 Other specified diseases and conditions complicating pregnancy: Secondary | ICD-10-CM

## 2016-10-05 DIAGNOSIS — Z131 Encounter for screening for diabetes mellitus: Secondary | ICD-10-CM

## 2016-10-05 DIAGNOSIS — Z23 Encounter for immunization: Secondary | ICD-10-CM

## 2016-10-05 DIAGNOSIS — O9989 Other specified diseases and conditions complicating pregnancy, childbirth and the puerperium: Secondary | ICD-10-CM

## 2016-10-05 DIAGNOSIS — O26893 Other specified pregnancy related conditions, third trimester: Secondary | ICD-10-CM

## 2016-10-05 DIAGNOSIS — Z3493 Encounter for supervision of normal pregnancy, unspecified, third trimester: Secondary | ICD-10-CM

## 2016-10-05 DIAGNOSIS — M549 Dorsalgia, unspecified: Secondary | ICD-10-CM | POA: Insufficient documentation

## 2016-10-05 DIAGNOSIS — Z8659 Personal history of other mental and behavioral disorders: Secondary | ICD-10-CM

## 2016-10-05 LAB — POCT URINALYSIS DIPSTICK
BILIRUBIN UA: NEGATIVE
Blood, UA: NEGATIVE
Glucose, UA: NEGATIVE
KETONES UA: NEGATIVE
LEUKOCYTES UA: NEGATIVE
Nitrite, UA: NEGATIVE
PH UA: 7.5 (ref 5.0–8.0)
SPEC GRAV UA: 1.01 (ref 1.010–1.025)
Urobilinogen, UA: 0.2 E.U./dL

## 2016-10-05 MED ORDER — CYCLOBENZAPRINE HCL 10 MG PO TABS: 10.0000 mg | ORAL_TABLET | Freq: Three times a day (TID) | ORAL | 2 refills | Status: DC | PRN

## 2016-10-05 MED ORDER — TETANUS-DIPHTH-ACELL PERTUSSIS 5-2.5-18.5 LF-MCG/0.5 IM SUSP
0.5000 mL | Freq: Once | INTRAMUSCULAR | Status: AC
  Administered 2016-10-05: 0.5 mL via INTRAMUSCULAR

## 2016-10-05 NOTE — Patient Instructions (Signed)
[place] 2018 Prenatal Education Class Schedule Register at [email or web address] in the [place] or call Kerman Passey at [phone] 9:00a-5:00p M-F  Childbirth Preparation Certified Childbirth Educators teach this 5 week course.  Expectant parents are encouraged to take this class in their 3rd trimester, completing it by their 35-36th week. Meets in [facility], Lower Level.  Mondays Thursdays  7:00-9:00 p 7:00-9:00 p  July 23 - August 20 July 19 - August 16  September 17 - October 15 September 6 -October 4  November 5 - December 3 October 25 - November 29   No Class on Thanksgiving Day -November 22  Childbirth Preparation Refresher Course For those who have previously attended Prepared Childbirth Preparation classes, this class in incorporated into the 3rd and 4th classes in the Monday night childbirth series.  Course meets in the [facility]. Lower Level from 7:00p - 9:00p  August 6 & 13  October 1 & 8  November 19 & 26   Weekend Childbirth Waymon Amato Classes are held Saturday & Sunday, 1:00 5:00p Course meets in [facility], [place] Level  August 4 & 5  November 3 & 4    The Birth[facility] Tours Free tours are held on the third Sunday of each month at 3 pm.  The tour meets in the third floor waiting area and will take approximately 30 minutes.  Tours are also included in Childbirth class series as well as Brother/Sister class.  An online virtual tour can be seen at [email or web address].         Breastfeeding & Infant Nutrition The course incorporates returning to work or school.  Breast milk collection and storage with basic breastfeeding and infant nutrition. This two-class course is held the 2nd and 3rd Tuesday of each month from 7:00 -9:00 pm.  Course meets in the [facility] Lower level  June 12 & 19 July-No Class  August 14 & 21 September 11 &18  September 11 & 18 October 9 &16  November 13 &  20 December 11 & 18   [employer] welcomes any mother for a social outing with other Moms to share experiences and challenges in an informal setting.  Meets the 1st Thursday and 3rd Thursday 11:30a-1:00 pm of each month in [facility] 3rd floor classroom.  No registration required.  Newborn Essentials This course covers bathing, diapering, swaddling and more with practice on lifelike dolls.  Participants will also learn safety tips and infant CPR (Not for certification).  It is held the 1st Wednesday of each month from 7:00p-9:00p in the [facility], Lower level.  June 6 July- No Class  August 1 September 5  October 3 November 7  December 7    Preparing Big Brother & Sister This one session course prepares children and their parents for the arrival of a new baby.  It is held on the 1st Tuesday of each month from 6:30p - 8:00p. Course meets in the [facility], Lower level.  July-No Class August 7  September 4 October 2  November 6 December 4   [place] for [employer] Dads This nationally acclaimed class helps expecting and new dads with the basic skills and confidence to bond with their infants, support their mates, and provide a safe and healthy home environment for their new family. Classes are held the 2nd Saturday of every month from 9:00a - 12:00 noon.  Course meets in the [facility] Lower level.  June 9 August 11  October 13 No Class in December  Cord Blood Banking Information Cord blood banking is the process of collecting and storing the blood that is in the umbilical cord and placenta at the time of delivery. This blood contains stem cells, which can be used to treat many blood diseases, immune system disorders, and childhood cancers. Stem cells can also be used to research certain diseases and treatments. Many people who choose cord blood banking donate the blood. Donated blood can be used in lifesaving treatments or for research. Other people choose  to store the blood privately. Blood that is stored privately can only be used with the person's permission. This option is often chosen if:  A family member needs a stem cell transplant.  The child is part of an ethnic minority.  The child was conceived through in vitro fertilization.  What should I look for in a blood bank? A blood bank is the organization that coordinates cord blood banking. Make sure the cord blood bank that you use:  Is accredited.  Is financially stable.  Handles a large volume of cord blood samples.  Has a procedure in place for transport and storage.  Allows you the option of transferring your cord blood sample.  Has a procedure in place if the bank goes out of business.  Clearly states all costs and limits to future costs.  People who choose to donate cord blood should not need to pay for blood banking. People who keep the blood for private use will need to pay for the first (initial) storage and pay a fee each year (annual fee). Other fees may also apply. What are the risks of cord blood banking? There are no health risks associated with cord blood banking. It is considered safe. How should I prepare? You must schedule this process at least 4-6 weeks before you will be giving birth. How is the blood collected? The blood is collected as soon as the baby has been delivered. Within 15 minutes of delivery, a health care provider will take these actions to collect the blood:  Clamp the umbilical cord at the top and bottom. This traps the blood in the umbilical cord.  Use a syringe or bag to collect the blood.  Insert needles into the placenta to collect (draw out) more blood.  What happens after the blood is collected? After the blood has been collected:  The blood will be sent to a blood bank.  The blood will be tested for genetic problems and infectious diseases. If the blood tests positive for a genetic problem or a disease, someone will contact you  and let you know.  The blood will be frozen.  If your child develops a genetic condition, immune system disorder, or cancer, you will be responsible for contacting the blood bank and letting them know. This information is not intended to replace advice given to you by your health care provider. Make sure you discuss any questions you have with your health care provider. Document Released: 06/25/2009 Document Revised: 06/13/2015 Document Reviewed: 06/25/2014 Elsevier Interactive Patient Education  2018 [employer]. Breastfeeding Deciding to breastfeed is one of the best choices you can make for you and your baby. A change in hormones during pregnancy causes your breast tissue to grow and increases the number and size of your milk ducts. These hormones also allow proteins, sugars, and fats from your blood supply to make breast milk in your milk-producing glands. Hormones prevent breast milk from  being released before your baby is born as well as prompt milk flow after birth. Once breastfeeding has begun, thoughts of your baby, as well as his or her sucking or crying, can stimulate the release of milk from your milk-producing glands. Benefits of breastfeeding For Your Baby  Your first milk (colostrum) helps your baby's digestive system function better.  There are antibodies in your milk that help your baby fight off infections.  Your baby has a lower incidence of asthma, allergies, and sudden infant death syndrome.  The nutrients in breast milk are better for your baby than infant formulas and are designed uniquely for your baby's needs.  Breast milk improves your baby's brain development.  Your baby is less likely to develop other conditions, such as childhood obesity, asthma, or type 2 diabetes mellitus.  For You  Breastfeeding helps to create a very special bond between you and your baby.  Breastfeeding is convenient. Breast milk is always available at the correct temperature and costs  nothing.  Breastfeeding helps to burn calories and helps you lose the weight gained during pregnancy.  Breastfeeding makes your uterus contract to its prepregnancy size faster and slows bleeding (lochia) after you give birth.  Breastfeeding helps to lower your risk of developing type 2 diabetes mellitus, osteoporosis, and breast or ovarian cancer later in life.  Signs that your baby is hungry Early Signs of Hunger  Increased alertness or activity.  Stretching.  Movement of the head from side to side.  Movement of the head and opening of the mouth when the corner of the mouth or cheek is stroked (rooting).  Increased sucking sounds, smacking lips, cooing, sighing, or squeaking.  Hand-to-mouth movements.  Increased sucking of fingers or hands.  Late Signs of Hunger  Fussing.  Intermittent crying.  Extreme Signs of Hunger Signs of extreme hunger will require calming and consoling before your baby will be able to breastfeed successfully. Do not wait for the following signs of extreme hunger to occur before you initiate breastfeeding:  Restlessness.  A loud, strong cry.  Screaming.  Breastfeeding basics Breastfeeding Initiation  Find a comfortable place to sit or lie down, with your neck and back well supported.  Place a pillow or rolled up blanket under your baby to bring him or her to the level of your breast (if you are seated). Nursing pillows are specially designed to help support your arms and your baby while you breastfeed.  Make sure that your baby's abdomen is facing your abdomen.  Gently massage your breast. With your fingertips, massage from your chest wall toward your nipple in a circular motion. This encourages milk flow. You may need to continue this action during the feeding if your milk flows slowly.  Support your breast with 4 fingers underneath and your thumb above your nipple. Make sure your fingers are well away from your nipple and your baby's  mouth.  Stroke your baby's lips gently with your finger or nipple.  When your baby's mouth is open wide enough, quickly bring your baby to your breast, placing your entire nipple and as much of the colored area around your nipple (areola) as possible into your baby's mouth. ? More areola should be visible above your baby's upper lip than below the lower lip. ? Your baby's tongue should be between his or her lower gum and your breast.  Ensure that your baby's mouth is correctly positioned around your nipple (latched). Your baby's lips should create a seal on your breast  and be turned out (everted).  It is common for your baby to suck about 2-3 minutes in order to start the flow of breast milk.  Latching Teaching your baby how to latch on to your breast properly is very important. An improper latch can cause nipple pain and decreased milk supply for you and poor weight gain in your baby. Also, if your baby is not latched onto your nipple properly, he or she may swallow some air during feeding. This can make your baby fussy. Burping your baby when you switch breasts during the feeding can help to get rid of the air. However, teaching your baby to latch on properly is still the best way to prevent fussiness from swallowing air while breastfeeding. Signs that your baby has successfully latched on to your nipple:  Silent tugging or silent sucking, without causing you pain.  Swallowing heard between every 3-4 sucks.  Muscle movement above and in front of his or her ears while sucking.  Signs that your baby has not successfully latched on to nipple:  Sucking sounds or smacking sounds from your baby while breastfeeding.  Nipple pain.  If you think your baby has not latched on correctly, slip your finger into the corner of your baby's mouth to break the suction and place it between your baby's gums. Attempt breastfeeding initiation again. Signs of Successful Breastfeeding Signs from your  baby:  A gradual decrease in the number of sucks or complete cessation of sucking.  Falling asleep.  Relaxation of his or her body.  Retention of a small amount of milk in his or her mouth.  Letting go of your breast by himself or herself.  Signs from you:  Breasts that have increased in firmness, weight, and size 1-3 hours after feeding.  Breasts that are softer immediately after breastfeeding.  Increased milk volume, as well as a change in milk consistency and color by the fifth day of breastfeeding.  Nipples that are not sore, cracked, or bleeding.  Signs That Your Pecola Leisure is Getting Enough Milk  Wetting at least 1-2 diapers during the first 24 hours after birth.  Wetting at least 5-6 diapers every 24 hours for the first week after birth. The urine should be clear or pale yellow by 5 days after birth.  Wetting 6-8 diapers every 24 hours as your baby continues to grow and develop.  At least 3 stools in a 24-hour period by age 36 days. The stool should be soft and yellow.  At least 3 stools in a 24-hour period by age 10 days. The stool should be seedy and yellow.  No loss of weight greater than 10% of birth weight during the first 60 days of age.  Average weight gain of 4-7 ounces (113-198 g) per week after age 37 days.  Consistent daily weight gain by age 36 days, without weight loss after the age of 2 weeks.  After a feeding, your baby may spit up a small amount. This is common. Breastfeeding frequency and duration Frequent feeding will help you make more milk and can prevent sore nipples and breast engorgement. Breastfeed when you feel the need to reduce the fullness of your breasts or when your baby shows signs of hunger. This is called "breastfeeding on demand." Avoid introducing a pacifier to your baby while you are working to establish breastfeeding (the first 4-6 weeks after your baby is born). After this time you may choose to use a pacifier. Research has shown that  pacifier use  during the first year of a baby's life decreases the risk of sudden infant death syndrome (SIDS). Allow your baby to feed on each breast as long as he or she wants. Breastfeed until your baby is finished feeding. When your baby unlatches or falls asleep while feeding from the first breast, offer the second breast. Because newborns are often sleepy in the first few weeks of life, you may need to awaken your baby to get him or her to feed. Breastfeeding times will vary from baby to baby. However, the following rules can serve as a guide to help you ensure that your baby is properly fed:  Newborns (babies 21 weeks of age or younger) may breastfeed every 1-3 hours.  Newborns should not go longer than 3 hours during the day or 5 hours during the night without breastfeeding.  You should breastfeed your baby a minimum of 8 times in a 24-hour period until you begin to introduce solid foods to your baby at around 9 months of age.  Breast milk pumping Pumping and storing breast milk allows you to ensure that your baby is exclusively fed your breast milk, even at times when you are unable to breastfeed. This is especially important if you are going back to work while you are still breastfeeding or when you are not able to be present during feedings. Your lactation consultant can give you guidelines on how long it is safe to store breast milk. A breast pump is a machine that allows you to pump milk from your breast into a sterile bottle. The pumped breast milk can then be stored in a refrigerator or freezer. Some breast pumps are operated by hand, while others use electricity. Ask your lactation consultant which type will work best for you. Breast pumps can be purchased, but some hospitals and breastfeeding support groups lease breast pumps on a monthly basis. A lactation consultant can teach you how to hand express breast milk, if you prefer not to use a pump. Caring for your breasts while you  breastfeed Nipples can become dry, cracked, and sore while breastfeeding. The following recommendations can help keep your breasts moisturized and healthy:  Avoid using soap on your nipples.  Wear a supportive bra. Although not required, special nursing bras and tank tops are designed to allow access to your breasts for breastfeeding without taking off your entire bra or top. Avoid wearing underwire-style bras or extremely tight bras.  Air dry your nipples for 3-7minutes after each feeding.  Use only cotton bra pads to absorb leaked breast milk. Leaking of breast milk between feedings is normal.  Use lanolin on your nipples after breastfeeding. Lanolin helps to maintain your skin's normal moisture barrier. If you use pure lanolin, you do not need to wash it off before feeding your baby again. Pure lanolin is not toxic to your baby. You may also hand express a few drops of breast milk and gently massage that milk into your nipples and allow the milk to air dry.  In the first few weeks after giving birth, some women experience extremely full breasts (engorgement). Engorgement can make your breasts feel heavy, warm, and tender to the touch. Engorgement peaks within 3-5 days after you give birth. The following recommendations can help ease engorgement:  Completely empty your breasts while breastfeeding or pumping. You may want to start by applying warm, moist heat (in the shower or with warm water-soaked hand towels) just before feeding or pumping. This increases circulation and helps the milk  flow. If your baby does not completely empty your breasts while breastfeeding, pump any extra milk after he or she is finished.  Wear a snug bra (nursing or regular) or tank top for 1-2 days to signal your body to slightly decrease milk production.  Apply ice packs to your breasts, unless this is too uncomfortable for you.  Make sure that your baby is latched on and positioned properly while  breastfeeding.  If engorgement persists after 48 hours of following these recommendations, contact your health care provider or a Advertising copywriter. Overall health care recommendations while breastfeeding  Eat healthy foods. Alternate between meals and snacks, eating 3 of each per day. Because what you eat affects your breast milk, some of the foods may make your baby more irritable than usual. Avoid eating these foods if you are sure that they are negatively affecting your baby.  Drink milk, fruit juice, and water to satisfy your thirst (about 10 glasses a day).  Rest often, relax, and continue to take your prenatal vitamins to prevent fatigue, stress, and anemia.  Continue breast self-awareness checks.  Avoid chewing and smoking tobacco. Chemicals from cigarettes that pass into breast milk and exposure to secondhand smoke may harm your baby.  Avoid alcohol and drug use, including marijuana. Some medicines that may be harmful to your baby can pass through breast milk. It is important to ask your health care provider before taking any medicine, including all over-the-counter and prescription medicine as well as vitamin and herbal supplements. It is possible to become pregnant while breastfeeding. If birth control is desired, ask your health care provider about options that will be safe for your baby. Contact a health care provider if:  You feel like you want to stop breastfeeding or have become frustrated with breastfeeding.  You have painful breasts or nipples.  Your nipples are cracked or bleeding.  Your breasts are red, tender, or warm.  You have a swollen area on either breast.  You have a fever or chills.  You have nausea or vomiting.  You have drainage other than breast milk from your nipples.  Your breasts do not become full before feedings by the fifth day after you give birth.  You feel sad and depressed.  Your baby is too sleepy to eat well.  Your baby is having  trouble sleeping.  Your baby is wetting less than 3 diapers in a 24-hour period.  Your baby has less than 3 stools in a 24-hour period.  Your baby's skin or the white part of his or her eyes becomes yellow.  Your baby is not gaining weight by 50 days of age. Get help right away if:  Your baby is overly tired (lethargic) and does not want to wake up and feed.  Your baby develops an unexplained fever. This information is not intended to replace advice given to you by your health care provider. Make sure you discuss any questions you have with your health care provider. Document Released: 01/05/2005 Document Revised: 06/19/2015 Document Reviewed: 06/29/2012 Elsevier Interactive Patient Education  2017 [employer]. Third Trimester of Pregnancy The third trimester is from week 28 through week 40 (months 7 through 9). The third trimester is a time when the unborn baby (fetus) is growing rapidly. At the end of the ninth month, the fetus is about 20 inches in length and weighs 6-10 pounds. Body changes during your third trimester Your body will continue to go through many changes during pregnancy. The changes vary from  woman to woman. During the third trimester:  Your weight will continue to increase. You can expect to gain 25-35 pounds (11-16 kg) by the end of the pregnancy.  You may begin to get stretch marks on your hips, abdomen, and breasts.  You may urinate more often because the fetus is moving lower into your pelvis and pressing on your bladder.  You may develop or continue to have heartburn. This is caused by increased hormones that slow down muscles in the digestive tract.  You may develop or continue to have constipation because increased hormones slow digestion and cause the muscles that push waste through your intestines to relax.  You may develop hemorrhoids. These are swollen veins (varicose veins) in the rectum that can itch or be painful.  You may develop swollen, bulging  veins (varicose veins) in your legs.  You may have increased body aches in the pelvis, back, or thighs. This is due to weight gain and increased hormones that are relaxing your joints.  You may have changes in your hair. These can include thickening of your hair, rapid growth, and changes in texture. Some women also have hair loss during or after pregnancy, or hair that feels dry or thin. Your hair will most likely return to normal after your baby is born.  Your breasts will continue to grow and they will continue to become tender. A yellow fluid (colostrum) may leak from your breasts. This is the first milk you are producing for your baby.  Your belly button may stick out.  You may notice more swelling in your hands, face, or ankles.  You may have increased tingling or numbness in your hands, arms, and legs. The skin on your belly may also feel numb.  You may feel short of breath because of your expanding uterus.  You may have more problems sleeping. This can be caused by the size of your belly, increased need to urinate, and an increase in your body's metabolism.  You may notice the fetus "dropping," or moving lower in your abdomen (lightening).  You may have increased vaginal discharge.  You may notice your joints feel loose and you may have pain around your pelvic bone.  What to expect at prenatal visits You will have prenatal exams every 2 weeks until week 36. Then you will have weekly prenatal exams. During a routine prenatal visit:  You will be weighed to make sure you and the baby are growing normally.  Your blood pressure will be taken.  Your abdomen will be measured to track your baby's growth.  The fetal heartbeat will be listened to.  Any test results from the previous visit will be discussed.  You may have a cervical check near your due date to see if your cervix has softened or thinned (effaced).  You will be tested for Group B streptococcus. This happens between  35 and 37 weeks.  Your health care provider may ask you:  What your birth plan is.  How you are feeling.  If you are feeling the baby move.  If you have had any abnormal symptoms, such as leaking fluid, bleeding, severe headaches, or abdominal cramping.  If you are using any tobacco products, including cigarettes, chewing tobacco, and electronic cigarettes.  If you have any questions.  Other tests or screenings that may be performed during your third trimester include:  Blood tests that check for low iron levels (anemia).  Fetal testing to check the health, activity level, and growth of the  fetus. Testing is done if you have certain medical conditions or if there are problems during the pregnancy.  Nonstress test (NST). This test checks the health of your baby to make sure there are no signs of problems, such as the baby not getting enough oxygen. During this test, a belt is placed around your belly. The baby is made to move, and its heart rate is monitored during movement.  What is false labor? False labor is a condition in which you feel small, irregular tightenings of the muscles in the womb (contractions) that usually go away with rest, changing position, or drinking water. These are called Braxton Hicks contractions. Contractions may last for hours, days, or even weeks before true labor sets in. If contractions come at regular intervals, become more frequent, increase in intensity, or become painful, you should see your health care provider. What are the signs of labor?  Abdominal cramps.  Regular contractions that start at 10 minutes apart and become stronger and more frequent with time.  Contractions that start on the top of the uterus and spread down to the lower abdomen and back.  Increased pelvic pressure and dull back pain.  A watery or bloody mucus discharge that comes from the vagina.  Leaking of amniotic fluid. This is also known as your "water breaking." It could  be a slow trickle or a gush. Let your health care provider know if it has a color or strange odor. If you have any of these signs, call your health care provider right away, even if it is before your due date. Follow these instructions at home: Medicines  Follow your health care provider's instructions regarding medicine use. Specific medicines may be either safe or unsafe to take during pregnancy.  Take a prenatal vitamin that contains at least 600 micrograms (mcg) of folic acid.  If you develop constipation, try taking a stool softener if your health care provider approves. Eating and drinking  Eat a balanced diet that includes fresh fruits and vegetables, whole grains, good sources of protein such as meat, eggs, or tofu, and low-fat dairy. Your health care provider will help you determine the amount of weight gain that is right for you.  Avoid raw meat and uncooked cheese. These carry germs that can cause birth defects in the baby.  If you have low calcium intake from food, talk to your health care provider about whether you should take a daily calcium supplement.  Eat four or five small meals rather than three large meals a day.  Limit foods that are high in fat and processed sugars, such as fried and sweet foods.  To prevent constipation: ? Drink enough fluid to keep your urine clear or pale yellow. ? Eat foods that are high in fiber, such as fresh fruits and vegetables, whole grains, and beans. Activity  Exercise only as directed by your health care provider. Most women can continue their usual exercise routine during pregnancy. Try to exercise for 30 minutes at least 5 days a week. Stop exercising if you experience uterine contractions.  Avoid heavy lifting.  Do not exercise in extreme heat or humidity, or at high altitudes.  Wear low-heel, comfortable shoes.  Practice good posture.  You may continue to have sex unless your health care provider tells you  otherwise. Relieving pain and discomfort  Take frequent breaks and rest with your legs elevated if you have leg cramps or low back pain.  Take warm sitz baths to soothe any pain or  discomfort caused by hemorrhoids. Use hemorrhoid cream if your health care provider approves.  Wear a good support bra to prevent discomfort from breast tenderness.  If you develop varicose veins: ? Wear support pantyhose or compression stockings as told by your healthcare provider. ? Elevate your feet for 15 minutes, 3-4 times a day. Prenatal care  Write down your questions. Take them to your prenatal visits.  Keep all your prenatal visits as told by your health care provider. This is important. Safety  Wear your seat belt at all times when driving.  Make a list of emergency phone numbers, including numbers for family, friends, the hospital, and police and fire departments. General instructions  Avoid cat litter boxes and soil used by cats. These carry germs that can cause birth defects in the baby. If you have a cat, ask someone to clean the litter box for you.  Do not travel far distances unless it is absolutely necessary and only with the approval of your health care provider.  Do not use hot tubs, steam rooms, or saunas.  Do not drink alcohol.  Do not use any products that contain nicotine or tobacco, such as cigarettes and e-cigarettes. If you need help quitting, ask your health care provider.  Do not use any medicinal herbs or unprescribed drugs. These chemicals affect the formation and growth of the baby.  Do not douche or use tampons or scented sanitary pads.  Do not cross your legs for long periods of time.  To prepare for the arrival of your baby: ? Take prenatal classes to understand, practice, and ask questions about labor and delivery. ? Make a trial run to the hospital. ? Visit the hospital and tour the maternity area. ? Arrange for maternity or paternity leave through  employers. ? Arrange for family and friends to take care of pets while you are in the hospital. ? Purchase a rear-facing car seat and make sure you know how to install it in your car. ? Pack your hospital bag. ? Prepare the baby's nursery. Make sure to remove all pillows and stuffed animals from the baby's crib to prevent suffocation.  Visit your dentist if you have not gone during your pregnancy. Use a soft toothbrush to brush your teeth and be gentle when you floss. Contact a health care provider if:  You are unsure if you are in labor or if your water has broken.  You become dizzy.  You have mild pelvic cramps, pelvic pressure, or nagging pain in your abdominal area.  You have lower back pain.  You have persistent nausea, vomiting, or diarrhea.  You have an unusual or bad smelling vaginal discharge.  You have pain when you urinate. Get help right away if:  Your water breaks before 37 weeks.  You have regular contractions less than 5 minutes apart before 37 weeks.  You have a fever.  You are leaking fluid from your vagina.  You have spotting or bleeding from your vagina.  You have severe abdominal pain or cramping.  You have rapid weight loss or weight gain.  You have shortness of breath with chest pain.  You notice sudden or extreme swelling of your face, hands, ankles, feet, or legs.  Your baby makes fewer than 10 movements in 2 hours.  You have severe headaches that do not go away when you take medicine.  You have vision changes. Summary  The third trimester is from week 28 through week 40, months 7 through 9. The third  trimester is a time when the unborn baby (fetus) is growing rapidly.  During the third trimester, your discomfort may increase as you and your baby continue to gain weight. You may have abdominal, leg, and back pain, sleeping problems, and an increased need to urinate.  During the third trimester your breasts will keep growing and they will  continue to become tender. A yellow fluid (colostrum) may leak from your breasts. This is the first milk you are producing for your baby.  False labor is a condition in which you feel small, irregular tightenings of the muscles in the womb (contractions) that eventually go away. These are called Braxton Hicks contractions. Contractions may last for hours, days, or even weeks before true labor sets in.  Signs of labor can include: abdominal cramps; regular contractions that start at 10 minutes apart and become stronger and more frequent with time; watery or bloody mucus discharge that comes from the vagina; increased pelvic pressure and dull back pain; and leaking of amniotic fluid. This information is not intended to replace advice given to you by your health care provider. Make sure you discuss any questions you have with your health care provider. Document Released: 12/30/2000 Document Revised: 06/13/2015 Document Reviewed: 03/08/2012 Elsevier Interactive Patient Education  2017 [employer].

## 2016-10-05 NOTE — Progress Notes (Signed)
ROB-Pt doing well, reports bilateral hip and back pain. Rx: Flexeril. Discussed home treatment measures including abdominal support. CBC and glucola today. Blood transfusion consent reviewed and signed. Desires BTL, consent signed; copy kept and given. Education regarding childbirth classes, intrapartum pain management options, breastfeeding, and cord blood banking. Reviewed red flag symptoms and when to call. RTC x 2 weeks for ROB with Pattricia Boss or sooner if needed. Meeting with Jola Babinski, Pregnancy Case Manager, prior to today's visit.

## 2016-10-05 NOTE — Progress Notes (Signed)
ROB- glucola done, blood consent signed, pt is "tired", states she is having B hip pain

## 2016-10-06 ENCOUNTER — Other Ambulatory Visit: Payer: Self-pay | Admitting: Certified Nurse Midwife

## 2016-10-06 DIAGNOSIS — O99810 Abnormal glucose complicating pregnancy: Secondary | ICD-10-CM

## 2016-10-06 LAB — CBC
HEMOGLOBIN: 10.2 g/dL — AB (ref 11.1–15.9)
Hematocrit: 29.8 % — ABNORMAL LOW (ref 34.0–46.6)
MCH: 30 pg (ref 26.6–33.0)
MCHC: 34.2 g/dL (ref 31.5–35.7)
MCV: 88 fL (ref 79–97)
Platelets: 208 10*3/uL (ref 150–379)
RBC: 3.4 x10E6/uL — AB (ref 3.77–5.28)
RDW: 13.5 % (ref 12.3–15.4)
WBC: 10 10*3/uL (ref 3.4–10.8)

## 2016-10-06 LAB — GLUCOSE, 1 HOUR GESTATIONAL: GESTATIONAL DIABETES SCREEN: 148 mg/dL — AB (ref 65–139)

## 2016-10-12 ENCOUNTER — Observation Stay
Admission: EM | Admit: 2016-10-12 | Discharge: 2016-10-12 | Disposition: A | Discharge status: Home / Self Care | Payer: Medicaid Other | Attending: Obstetrics and Gynecology | Admitting: Obstetrics and Gynecology

## 2016-10-12 ENCOUNTER — Telehealth: Payer: Self-pay | Admitting: Certified Nurse Midwife

## 2016-10-12 DIAGNOSIS — O26893 Other specified pregnancy related conditions, third trimester: Secondary | ICD-10-CM | POA: Diagnosis not present

## 2016-10-12 DIAGNOSIS — Z3A29 29 weeks gestation of pregnancy: Secondary | ICD-10-CM | POA: Diagnosis not present

## 2016-10-12 DIAGNOSIS — O4703 False labor before 37 completed weeks of gestation, third trimester: Principal | ICD-10-CM | POA: Insufficient documentation

## 2016-10-12 DIAGNOSIS — R109 Unspecified abdominal pain: Secondary | ICD-10-CM | POA: Diagnosis not present

## 2016-10-12 LAB — URINALYSIS, COMPLETE (UACMP) WITH MICROSCOPIC
Bilirubin Urine: NEGATIVE
Glucose, UA: NEGATIVE mg/dL
HGB URINE DIPSTICK: NEGATIVE
Ketones, ur: NEGATIVE mg/dL
LEUKOCYTES UA: NEGATIVE
Nitrite: NEGATIVE
PH: 7 (ref 5.0–8.0)
Protein, ur: 30 mg/dL — AB
SPECIFIC GRAVITY, URINE: 1.015 (ref 1.005–1.030)

## 2016-10-12 NOTE — Discharge Instructions (Signed)
°  Notify your doctor if any of the following occur: 1. Bleeding from the vagina 7. Sudden, constant, or occasional abdominal pain  2. Pain or burning when urinating 8. Sudden gushing of fluid from the vagina (with or without continued leaking)  3. Chills or fever 9. Fainting spells, "black outs" or loss of consciousness  4. Increase in vaginal discharge 10. Severe or continued nausea or vomiting  5. Pelvic pressure (sudden increase) 11. Blurring of vision or spots before the eyes  6. Baby moving less than usual 12. Leaking of fluid    FETAL KICK COUNT: Lie on your left side for one hour after a meal, and count the number of times your baby kicks. If it is less than 5 times, get up, move around and drink some juice. Repeat the test 30 minutes later. If it is still less than 5 kicks in an hour, notify your doctor.

## 2016-10-12 NOTE — Telephone Encounter (Signed)
Patient sent in a fax request stating that she is having bad cramping pain towards the back of her legs, lower abdomin pain with nausea, and also vaginal pressure. No other information was disclosed. Please advise.

## 2016-10-12 NOTE — Discharge Summary (Signed)
L&D OB Triage Note  Diamond Sutton is a 31 y.o. [id number] female at [redacted]w[redacted]d, EDD Estimated Date of Delivery: 12/26/16 who presented to triage for complaints of lower abdomen cramping since 1 am intermettent.  She was evaluated by the nurses with no significant findings/findings significant for preterm labor. Vital signs stable. An NST was performed and has been reviewed by myself. She was treated reassured.   NST INTERPRETATION: Indications: rule out uterine contractions  Mode: External Baseline Rate (A): 150 bpm Variability: Moderate Accelerations: 10 x 10       Contraction Frequency (min): uterine irritability  Impression: reactive   Plan: NST performed was reviewed and was found to be reactive. She was discharged home with bleeding/labor precautions.  Continue routine prenatal care. Follow up with OB/GYN as previously scheduled.     Melody Suzan Nailer, CNM

## 2016-10-12 NOTE — OB Triage Note (Signed)
Ms. Ciani here with c/o "menstrual-like cramping" since 0100, also, nausea, pressure to have BM, but limited ability to produce BM. Denies bleeding, LOF, reports posiitve fetal movement.

## 2016-10-12 NOTE — Telephone Encounter (Signed)
lmtrc

## 2016-10-13 ENCOUNTER — Other Ambulatory Visit: Payer: Self-pay

## 2016-10-13 NOTE — Telephone Encounter (Signed)
Seen in ed by mns. See note.

## 2016-10-21 ENCOUNTER — Encounter: Payer: Self-pay | Admitting: Certified Nurse Midwife

## 2016-10-21 ENCOUNTER — Ambulatory Visit (INDEPENDENT_AMBULATORY_CARE_PROVIDER_SITE_OTHER): Payer: Medicaid Other | Admitting: Certified Nurse Midwife

## 2016-10-21 VITALS — BP 126/77 | HR 91 | Wt 168.9 lb

## 2016-10-21 DIAGNOSIS — Z3493 Encounter for supervision of normal pregnancy, unspecified, third trimester: Secondary | ICD-10-CM

## 2016-10-21 LAB — POCT URINALYSIS DIPSTICK
Bilirubin, UA: NEGATIVE
GLUCOSE UA: NEGATIVE
KETONES UA: NEGATIVE
Leukocytes, UA: NEGATIVE
Nitrite, UA: NEGATIVE
PROTEIN UA: NEGATIVE
RBC UA: NEGATIVE
SPEC GRAV UA: 1.01 (ref 1.010–1.025)
UROBILINOGEN UA: 0.2 U/dL
pH, UA: 6 (ref 5.0–8.0)

## 2016-10-21 NOTE — Progress Notes (Signed)
ROB, doing well. Endorses good fetal movement. She has some contractions but less than 6 hr. Discussed fetal movement and PTL precautions. ROB in 2 wks.   Doreene Burke, CNM

## 2016-10-21 NOTE — Patient Instructions (Signed)

## 2016-11-04 ENCOUNTER — Ambulatory Visit (INDEPENDENT_AMBULATORY_CARE_PROVIDER_SITE_OTHER): Payer: Medicaid Other | Admitting: Obstetrics and Gynecology

## 2016-11-04 VITALS — BP 128/70 | HR 95 | Wt 175.0 lb

## 2016-11-04 DIAGNOSIS — Z3493 Encounter for supervision of normal pregnancy, unspecified, third trimester: Secondary | ICD-10-CM

## 2016-11-04 LAB — POCT URINALYSIS DIPSTICK
Bilirubin, UA: NEGATIVE
Blood, UA: NEGATIVE
GLUCOSE UA: NEGATIVE
Ketones, UA: NEGATIVE
LEUKOCYTES UA: NEGATIVE
NITRITE UA: NEGATIVE
SPEC GRAV UA: 1.01 (ref 1.010–1.025)
UROBILINOGEN UA: 0.2 U/dL
pH, UA: 7 (ref 5.0–8.0)

## 2016-11-04 NOTE — Progress Notes (Signed)
ROB- pt is having some low back pain, B hip pain

## 2016-11-04 NOTE — Progress Notes (Signed)
ROB-never schedule glucola- will do next week.

## 2016-11-11 ENCOUNTER — Other Ambulatory Visit: Payer: Self-pay

## 2016-11-17 ENCOUNTER — Ambulatory Visit (INDEPENDENT_AMBULATORY_CARE_PROVIDER_SITE_OTHER): Payer: Medicaid Other | Admitting: Certified Nurse Midwife

## 2016-11-17 ENCOUNTER — Other Ambulatory Visit: Payer: Medicaid Other

## 2016-11-17 VITALS — BP 134/77 | HR 98 | Wt 173.5 lb

## 2016-11-17 DIAGNOSIS — R7302 Impaired glucose tolerance (oral): Secondary | ICD-10-CM

## 2016-11-17 DIAGNOSIS — L299 Pruritus, unspecified: Secondary | ICD-10-CM | POA: Insufficient documentation

## 2016-11-17 DIAGNOSIS — O99810 Abnormal glucose complicating pregnancy: Secondary | ICD-10-CM

## 2016-11-17 DIAGNOSIS — R7309 Other abnormal glucose: Secondary | ICD-10-CM

## 2016-11-17 DIAGNOSIS — Z3493 Encounter for supervision of normal pregnancy, unspecified, third trimester: Secondary | ICD-10-CM

## 2016-11-17 LAB — POCT URINALYSIS DIPSTICK
BILIRUBIN UA: NEGATIVE
GLUCOSE UA: NEGATIVE
KETONES UA: NEGATIVE
Leukocytes, UA: NEGATIVE
NITRITE UA: NEGATIVE
RBC UA: NEGATIVE
SPEC GRAV UA: 1.01 (ref 1.010–1.025)
UROBILINOGEN UA: 0.2 U/dL
pH, UA: 6.5 (ref 5.0–8.0)

## 2016-11-17 MED ORDER — HYDROXYZINE PAMOATE 25 MG PO CAPS: 25.0000 mg | ORAL_CAPSULE | Freq: Three times a day (TID) | ORAL | 0 refills | Status: DC | PRN

## 2016-11-17 NOTE — Progress Notes (Signed)
ROB- 3 hr 3TT done today, pt is itching all over x 5 days

## 2016-11-17 NOTE — Patient Instructions (Addendum)
Skin Conditions During Pregnancy Pregnancy affects many parts of your body. One part is your skin. Most skin problems that develop during pregnancy are not serious and are considered a normal part of pregnancy. They go away on their own after the baby is born. Other skin problems may need treatment. What type of skin problems can develop during pregnancy?  Stretch marks. Stretch marks are purple or pink lines on the skin. They may appear on the belly, breasts, thighs, or buttocks. Stretch marks are caused by weight gain that causes the skin to stretch. Stretch marks do not cause problems. Almost all women get them during pregnancy.  Darkening of the skin (hyperpigmentation). The darkening may occur in patches or as a line. Patches may appear on the face, nipples, or genital area. Lines often stretch from the belly button to the pubic area. Hyperpigmentation develops in almost all pregnant women. It is more severe in women with a dark complexion.  Spider angiomas. These are tiny pink or red lines that go out from a center point, like the legs of a spider. Usually, they are on the face, neck, and arms. They do not cause problems. They are most common in women with light complexions.  Palmar erythema. This is a reddening of the palms. It is most common in women with light complexions.  Swelling and redness. This can occur on the face, eyelids, fingers, or toes.  Pruritic urticarial papules and plaques of pregnancy (PUPPP). This is a rash that is itchy, red, and has tiny blisters. The cause is unknown. It usually starts on the abdomen and may affect the arms or legs. It does not affect the face. It usually begins later in pregnancy. About a third of all pregnant women develop this condition. There are no associated problems to the fetus with this rash. Sometimes, oral steroids are used to calm down the itch. The rash clears after the baby is born.  Prurigo of pregnancy. This is a disease in which red  patches and bumps appear on the arms and legs. The cause is unknown. The patches and bumps clear after the baby is born. About a third of pregnant women develop this disease.  Acne. Pimples may develop, including in women who have had clear skin for a long time.  Skin tags. These are small flaps of skin that stick out from the body. They may grow or become darker during pregnancy. They are usually harmless.  Moles. These are flat or slightly raised growths. They are usually round and pink or brown. They may grow or become darker during pregnancy.  Intrahepatic cholestasis of pregnancy. This is a rare condition that causes itchy skin. It may run in families. It increases the risk of complications for the fetus. This condition usually resolves after delivery. It can recur with subsequent pregnancies.  Impetigo herpetiformis. This is a form of a severe skin disease called pustular psoriasis. Usually, delivery is the only method of resolving the condition.  Pruritic folliculitis of pregnancy. This is a rare condition that causes pimple-like skin growths. It develops in the middle or later stages of pregnancy. Its cause is unknown.It usually resolves 2-3 weeks after delivery.  Pemphigoid gestationis. This is a very rare autoimmune disease. It causes a severely itchy rash and blisters. The rash does not appear on the face, scalp, or inside of the mouth. It usually resolves 3 months after delivery. It may recur with subsequent pregnancies. Some pre-existing skin conditions, such as atopic dermatitis, may become worse   during pregnancy. Follow these instructions at home: Different conditions may have different instructions. In general:  Follow all your health care provider's directions about medicines to treat skin problems while you are pregnant. Do not use any over-the-counter medicines (including medicated creams and lotions) until you have checked with your health care provider. Many medicines are not  safe to use when you are pregnant.  Avoid time in the sun. This will help keep your skin from darkening. When you must be outside, use sunscreen and wear a hat with a wide brim to protect your face. The sunscreen should have a SPF of at least 15. This may help limit dark spots that develop when the skin is exposed to the sun.  To avoid problems from stretched skin: ? Do not sit or stand for long periods of time. ? Exercise regularly. This helps keep your skin in good condition.  Use a gentle soap. This helps prevent acne.  Do not get too hot or too sweaty. This makes some skin rashes worse.  Wear loose clothes made of a soft fabric. This prevents skin irritation.  For itching, add oatmeal or cornstarch to your bathwater.  Use a skin moisturizer. Ask your health care provider for suggestions.  This information is not intended to replace advice given to you by your health care provider. Make sure you discuss any questions you have with your health care provider. Document Released: 02/07/2010 Document Revised: 06/13/2015 Document Reviewed: 10/17/2012 Elsevier Interactive Patient Education  2018 [employer] of the uterus can occur throughout pregnancy, but they are not always a sign that you are in labor. You may have practice contractions called Braxton Hicks contractions. These false labor contractions are sometimes confused with true labor. What are Deberah Pelton contractions? Braxton Hicks contractions are tightening movements that occur in the muscles of the uterus before labor. Unlike true labor contractions, these contractions do not result in opening (dilation) and thinning of the cervix. Toward the end of pregnancy (32-34 weeks), Braxton Hicks contractions can happen more often and may become stronger. These contractions are sometimes difficult to tell apart from true labor because they can be very uncomfortable. You should not feel embarrassed  if you go to the hospital with false labor. Sometimes, the only way to tell if you are in true labor is for your health care provider to look for changes in the cervix. The health care provider will do a physical exam and may monitor your contractions. If you are not in true labor, the exam should show that your cervix is not dilating and your water has not broken. If there are no prenatal problems or other health problems associated with your pregnancy, it is completely safe for you to be sent home with false labor. You may continue to have Braxton Hicks contractions until you go into true labor. How can I tell the difference between true labor and false labor?  Differences ? False labor ? Contractions last 30-70 seconds.: Contractions are usually shorter and not as strong as true labor contractions. ? Contractions become very regular.: Contractions are usually irregular. ? Discomfort is usually felt in the top of the uterus, and it spreads to the lower abdomen and low back.: Contractions are often felt in the front of the lower abdomen and in the groin. ? Contractions do not go away with walking.: Contractions may go away when you walk around or change positions while lying down. ? Contractions usually become more intense and  increase in frequency.: Contractions get weaker and are shorter-lasting as time goes on. ? The cervix dilates and gets thinner.: The cervix usually does not dilate or become thin. Follow these instructions at home:  Take over-the-counter and prescription medicines only as told by your health care provider.  Keep up with your usual exercises and follow other instructions from your health care provider.  Eat and drink lightly if you think you are going into labor.  If Braxton Hicks contractions are making you uncomfortable: ? Change your position from lying down or resting to walking, or change from walking to resting. ? Sit and rest in a tub of warm water. ? Drink  enough fluid to keep your urine clear or pale yellow. Dehydration may cause these contractions. ? Do slow and deep breathing several times an hour.  Keep all follow-up prenatal visits as told by your health care provider. This is important. Contact a health care provider if:  You have a fever.  You have continuous pain in your abdomen. Get help right away if:  Your contractions become stronger, more regular, and closer together.  You have fluid leaking or gushing from your vagina.  You pass blood-tinged mucus (bloody show).  You have bleeding from your vagina.  You have low back pain that you never had before.  You feel your baby's head pushing down and causing pelvic pressure.  Your baby is not moving inside you as much as it used to. Summary  Contractions that occur before labor are called Braxton Hicks contractions, false labor, or practice contractions.  Braxton Hicks contractions are usually shorter, weaker, farther apart, and less regular than true labor contractions. True labor contractions usually become progressively stronger and regular and they become more frequent.  Manage discomfort from [facility] Hicks contractions by changing position, resting in a warm bath, drinking plenty of water, or practicing deep breathing. This information is not intended to replace advice given to you by your health care provider. Make sure you discuss any questions you have with your health care provider. Document Released: 01/05/2005 Document Revised: 11/25/2015 Document Reviewed: 11/25/2015 Elsevier Interactive Patient Education  2017 [employer]

## 2016-11-17 NOTE — Progress Notes (Signed)
ROB-Pt doing well, reports "body itching" and  braxton hicks contractions. Rx: Vistaril, see orders. Will obtain CMP and bile salts. Reviewed red flag symptoms and when to call. Discussed home treatment measures. Three (3) hr GTT today, will contact pt via MyChart with results. RTC x 2 weeks for 36 week cultures and ROB with Pattricia BossAnnie or sooner if needed.

## 2016-11-19 LAB — BILE ACIDS, TOTAL: Bile Acids Total: 6.9 umol/L (ref 4.7–24.5)

## 2016-11-19 LAB — COMPREHENSIVE METABOLIC PANEL
ALBUMIN: 3.4 g/dL — AB (ref 3.5–5.5)
ALT: 7 IU/L (ref 0–32)
AST: 14 IU/L (ref 0–40)
Albumin/Globulin Ratio: 1.3 (ref 1.2–2.2)
Alkaline Phosphatase: 160 IU/L — ABNORMAL HIGH (ref 39–117)
BUN/Creatinine Ratio: 10 (ref 9–23)
BUN: 5 mg/dL — ABNORMAL LOW (ref 6–20)
Bilirubin Total: <0.2 mg/dL (ref 0.0–1.2)
CALCIUM: 8.7 mg/dL (ref 8.7–10.2)
CO2: 18 mmol/L — AB (ref 20–29)
CREATININE: 0.52 mg/dL — AB (ref 0.57–1.00)
Chloride: 106 mmol/L (ref 96–106)
GFR calc Af Amer: 145 mL/min/{1.73_m2} (ref >59)
GFR calc non Af Amer: 126 mL/min/{1.73_m2} (ref >59)
GLOBULIN, TOTAL: 2.7 g/dL (ref 1.5–4.5)
Glucose: 165 mg/dL — ABNORMAL HIGH (ref 65–99)
Potassium: 3.5 mmol/L (ref 3.5–5.2)
SODIUM: 141 mmol/L (ref 134–144)
Total Protein: 6.1 g/dL (ref 6.0–8.5)

## 2016-11-19 LAB — GESTATIONAL GLUCOSE TOLERANCE
GLUCOSE FASTING: 111 mg/dL — AB (ref 65–94)
Glucose, GTT - 1 Hour: 250 mg/dL — ABNORMAL HIGH (ref 65–179)
Glucose, GTT - 2 Hour: 182 mg/dL — ABNORMAL HIGH (ref 65–154)
Glucose, GTT - 3 Hour: 105 mg/dL (ref 65–139)

## 2016-11-20 ENCOUNTER — Other Ambulatory Visit: Payer: Self-pay | Admitting: Certified Nurse Midwife

## 2016-11-20 DIAGNOSIS — O24419 Gestational diabetes mellitus in pregnancy, unspecified control: Secondary | ICD-10-CM | POA: Insufficient documentation

## 2016-11-20 NOTE — Progress Notes (Signed)
Please send GDM information to patient via mail. Add growth US to next visit. Referral has been placed for [facility]. Thanks, JML

## 2016-11-24 ENCOUNTER — Other Ambulatory Visit: Payer: Self-pay

## 2016-11-24 ENCOUNTER — Encounter: Payer: Self-pay | Admitting: Dietician

## 2016-11-24 ENCOUNTER — Encounter: Payer: Medicaid Other | Attending: Certified Nurse Midwife | Admitting: Dietician

## 2016-11-24 VITALS — BP 116/70 | Ht 66.0 in | Wt 176.3 lb

## 2016-11-24 DIAGNOSIS — Z3A00 Weeks of gestation of pregnancy not specified: Secondary | ICD-10-CM | POA: Insufficient documentation

## 2016-11-24 DIAGNOSIS — O24419 Gestational diabetes mellitus in pregnancy, unspecified control: Secondary | ICD-10-CM | POA: Insufficient documentation

## 2016-11-24 DIAGNOSIS — O24410 Gestational diabetes mellitus in pregnancy, diet controlled: Secondary | ICD-10-CM

## 2016-11-24 MED ORDER — ACCU-CHEK FASTCLIX LANCETS MISC: 1.0000 | Freq: Four times a day (QID) | 12 refills | Status: DC

## 2016-11-24 MED ORDER — GLUCOSE BLOOD VI STRP: ORAL_STRIP | 12 refills | Status: DC

## 2016-11-24 NOTE — Progress Notes (Signed)
Appt. Start Time: 1030 Appt. End Time: 1200  GDM Class 1 Diabetes Overview - define DM; state own type of DM; identify functions of pancreas and insulin; define insulin deficiency vs insulin resistance  Psychosocial - identify DM as a source of stress; state the effects of stress on BG control; verbalize appropriate stress management techniques; identify personal stress issues   Nutritional Management - describe effects of food on blood glucose; identify sources of carbohydrate, protein and fat; verbalize the importance of balance meals in controlling blood glucose; identify meals as well balanced or not; estimate servings of carbohydrate from menus; use food labels to identify servings size, content of carbohydrate, fiber, protein, fat, saturated fat and sodium; recognize food sources of fat, saturated fat, trans fat, sodium and verbalize goals for intake; describe healthful appropriate food choices when dining out   Exercise - describe the effects of exercise on blood glucose and importance of regular exercise in controlling diabetes; state a plan for personal exercise; verbalize contraindications for exercise  Medications - state name, dose, timing of currently prescribed medications; describe types of medications available for diabetes  Insulin Training - prepare and administer insulin accurately; state correct rotation pattern; verbalize safe and lawful needle disposal  Self-Monitoring - state importance of HBGM and demo procedure accurately; use HBGM results to effectively manage diabetes; identify importance of regular HbA1C testing and goals for results  Acute Complications/Sick Day Guidelines - recognize hyperglycemia and hypoglycemia with causes and effects; identify blood glucose results as high, low or in control; list steps in treating and preventing high and low blood glucose; state appropriate measure to manage blood glucose when ill (need for meds, HBGM plan, when to call physician,  need for fluids)  Chronic Complications/Foot, Skin, Eye Dental Care - identify possible long-term complications of diabetes (retinopathy, neuropathy, nephropathy, cardiovascular disease, infections); explain steps in prevention and treatment of chronic complications; state importance of daily self-foot exams; describe how to examine feet and what to look for; explain appropriate eye and dental care  Lifestyle Changes/Goals & Health/Community Resources - state benefits of making appropriate lifestyle changes; identify habits that need to change (meals, tobacco, alcohol); identify strategies to reduce risk factors for personal health; set goals for proper diabetes care; state need for and frequency of healthcare follow-up; describe appropriate community resources for good health (ADA, web sites, apps)   Pregnancy/Sexual Health - define gestational diabetes; state importance of good blood glucose control and birth control prior to pregnancy; state importance of good blood glucose control in preventing sexual problems (impotence, vaginal dryness, infections, loss of desire); state relationship of blood glucose control and pregnancy outcome; describe risk of maternal and fetal complications  Teaching Materials Used: Meter-Accuchek Guide meter General Meal Planning Guidelines Daily Food Record Gestational Diabetes Booklet Gestational Video Goals for Healthy Pregnancy

## 2016-11-24 NOTE — Patient Instructions (Signed)
Read booklet on Gestational Diabetes Follow Gestational Meal Planning Guidelines Complete a 3 Day Food Record and bring to next appointment Check blood sugars 4 x day - before breakfast and 2 hrs after every meal and record  Bring blood sugar log to all appointments Call MD for prescription for meter strips and lancets Strips: Accuchek Guide test strips  Lancets:   Accuchek Fastclix lancets Walk 20-30 minutes at least 5 x week if permitted by MD Next appointment    11-30-16

## 2016-11-26 ENCOUNTER — Telehealth: Payer: Self-pay | Admitting: Certified Nurse Midwife

## 2016-11-26 NOTE — Telephone Encounter (Signed)
I spoke with pt and per Marcelino DusterMichelle pt may increase Vistaril to (TWO) caps instead of one, suggested an aveno oatmeal bath and hydrocortisone cr as needed. Pt states she is itching all over so cream would not benefit her. Pt told of lab results. Also will contact pt tomorrow for an ultrasound appt with her next visit which is on 12/01/2016. Encouraged to keep log of blood sugars. Pt did see [facility] on 11/24/2016.

## 2016-11-26 NOTE — Telephone Encounter (Signed)
Patient called and stated that she is still waiting to get results back from her recent labs and the patient is also concerned about the medication that she was recently prescribed hydrOXYzine (VISTARIL) 25 MG capsule, The medication is not helping at all with the patient's itching, and is hoping to get another prescription or solution to help. The patient is ion a loot of discomfort and would like to have a call back as soon as possible. No other information was disclosed. Please advise.

## 2016-11-27 ENCOUNTER — Other Ambulatory Visit: Payer: Self-pay

## 2016-11-27 ENCOUNTER — Encounter: Payer: Self-pay | Admitting: Certified Nurse Midwife

## 2016-11-27 MED ORDER — BLOOD GLUCOSE MONITOR KIT: PACK | 0 refills | Status: DC

## 2016-11-30 ENCOUNTER — Ambulatory Visit: Payer: Medicaid Other | Admitting: Dietician

## 2016-12-01 ENCOUNTER — Ambulatory Visit (INDEPENDENT_AMBULATORY_CARE_PROVIDER_SITE_OTHER): Payer: Medicaid Other | Admitting: Certified Nurse Midwife

## 2016-12-01 ENCOUNTER — Encounter: Payer: Self-pay | Admitting: Certified Nurse Midwife

## 2016-12-01 VITALS — BP 119/71 | HR 77 | Wt 173.3 lb

## 2016-12-01 DIAGNOSIS — Z3493 Encounter for supervision of normal pregnancy, unspecified, third trimester: Secondary | ICD-10-CM

## 2016-12-01 DIAGNOSIS — Z369 Encounter for antenatal screening, unspecified: Secondary | ICD-10-CM

## 2016-12-01 DIAGNOSIS — Z113 Encounter for screening for infections with a predominantly sexual mode of transmission: Secondary | ICD-10-CM

## 2016-12-01 LAB — POCT URINALYSIS DIPSTICK
Bilirubin, UA: NEGATIVE
Blood, UA: NEGATIVE
GLUCOSE UA: NEGATIVE
KETONES UA: NEGATIVE
Leukocytes, UA: NEGATIVE
NITRITE UA: NEGATIVE
PH UA: 6.5 (ref 5.0–8.0)
PROTEIN UA: NEGATIVE
SPEC GRAV UA: 1.015 (ref 1.010–1.025)
UROBILINOGEN UA: 0.2 U/dL

## 2016-12-01 NOTE — Progress Notes (Signed)
ROB, doing well. She is complaining of leaking of fluid stating that her dress got wet yesterday x 1 and her clothing today x 1. Sterile spec. Exam. No fluid seen. Thick white mucus. Nitrazine negative, fern negative. GBS today. Blood sugar log reviewed. Fasting and 2 hr PP are with in range. NST, growth u/s next week.   Doreene BurkeAnnie Deborh Pense, CNM

## 2016-12-01 NOTE — Patient Instructions (Signed)
Group B Streptococcus Infection During Pregnancy Group B Streptococcus (GBS) is a type of bacteria (Streptococcus agalactiae) that is often found in healthy people, commonly in the rectum, vagina, and intestines. In people who are healthy and not pregnant, the bacteria rarely cause serious illness or complications. However, women who test positive for GBS during pregnancy can pass the bacteria to their baby during childbirth, which can cause serious infection in the baby after birth. Women with GBS may also have infections during their pregnancy or immediately after childbirth, such as such as urinary tract infections (UTIs) or infections of the uterus (uterine infections). Having GBS also increases a woman's risk of complications during pregnancy, such as early (preterm) labor or delivery, miscarriage, or stillbirth. Routine testing (screening) for GBS is recommended for all pregnant women. What increases the risk? You may have a higher risk for GBS infection during pregnancy if you had one during a past pregnancy. What are the signs or symptoms? In most cases, GBS infection does not cause symptoms in pregnant women. Signs and symptoms of a possible GBS-related infection may include:  Labor starting before the 37th week of pregnancy.  A UTI or bladder infection, which may cause: ? Fever. ? Pain or burning during urination. ? Frequent urination.  Fever during labor, along with: ? Bad-smelling discharge. ? Uterine tenderness. ? Rapid heartbeat in the mother, baby, or both.  Rare but serious symptoms of a possible GBS-related infection in women include:  Blood infection (septicemia). This may cause fever, chills, or confusion.  Lung infection (pneumonia). This may cause fever, chills, cough, rapid breathing, difficulty breathing, or chest pain.  Bone, joint, skin, or soft tissue infection.  How is this diagnosed? You may be screened for GBS between week 35 and week 37 of your pregnancy. If  you have symptoms of preterm labor, you may be screened earlier. This condition is diagnosed based on lab test results from:  A swab of fluid from the vagina and rectum.  A urine sample.  How is this treated? This condition is treated with antibiotic medicine. When you go into labor, or as soon as your water breaks (your membranes rupture), you will be given antibiotics through an IV tube. Antibiotics will continue until after you give birth. If you are having a cesarean delivery, you do not need antibiotics unless your membranes have already ruptured. Follow these instructions at home:  Take over-the-counter and prescription medicines only as told by your health care provider.  Take your antibiotic medicine as told by your health care provider. Do not stop taking the antibiotic even if you start to feel better.  Keep all pre-birth (prenatal) visits and follow-up visits as told by your health care provider. This is important. Contact a health care provider if:  You have pain or burning when you urinate.  You have to urinate frequently.  You have a fever or chills.  You develop a bad-smelling vaginal discharge. Get help right away if:  Your membranes rupture.  You go into labor.  You have severe pain in your abdomen.  You have difficulty breathing.  You have chest pain. This information is not intended to replace advice given to you by your health care provider. Make sure you discuss any questions you have with your health care provider. Document Released: 04/14/2007 Document Revised: 08/02/2015 Document Reviewed: 08/01/2015 Elsevier Interactive Patient Education  2018 [employer]  

## 2016-12-03 ENCOUNTER — Encounter: Payer: Self-pay | Admitting: Dietician

## 2016-12-03 ENCOUNTER — Telehealth: Payer: Self-pay | Admitting: Dietician

## 2016-12-03 LAB — STREP GP B NAA+RFLX: STREP GP B NAA+RFLX: NEGATIVE

## 2016-12-03 LAB — GC/CHLAMYDIA PROBE AMP
CHLAMYDIA, DNA PROBE: NEGATIVE
Neisseria gonorrhoeae by PCR: NEGATIVE

## 2016-12-03 NOTE — Progress Notes (Signed)
As patient chose not to reschedule her missed appointment, sent discharge letter to referring provider.

## 2016-12-03 NOTE — Telephone Encounter (Signed)
Called patient to reschedule her appointment which was missed on 11/30/16. She was willing to reschedule, but after reporting that her BG record was reviewed at MD office yesterday and BGs are in good control, as well as nearness of her due date, she elected not to reschedule. She stated that all BGs are under 120mg /dl. Denies any episodes of hypoglyemia.

## 2016-12-04 ENCOUNTER — Encounter: Payer: Self-pay | Admitting: Certified Nurse Midwife

## 2016-12-06 ENCOUNTER — Observation Stay
Admission: EM | Admit: 2016-12-06 | Discharge: 2016-12-06 | Disposition: A | Discharge status: Home / Self Care | Payer: Medicaid Other | Attending: Certified Nurse Midwife | Admitting: Certified Nurse Midwife

## 2016-12-06 DIAGNOSIS — Z880 Allergy status to penicillin: Secondary | ICD-10-CM | POA: Diagnosis not present

## 2016-12-06 DIAGNOSIS — E876 Hypokalemia: Secondary | ICD-10-CM | POA: Insufficient documentation

## 2016-12-06 DIAGNOSIS — O99283 Endocrine, nutritional and metabolic diseases complicating pregnancy, third trimester: Secondary | ICD-10-CM | POA: Diagnosis not present

## 2016-12-06 DIAGNOSIS — Z3490 Encounter for supervision of normal pregnancy, unspecified, unspecified trimester: Secondary | ICD-10-CM

## 2016-12-06 DIAGNOSIS — Z3A37 37 weeks gestation of pregnancy: Secondary | ICD-10-CM | POA: Insufficient documentation

## 2016-12-06 DIAGNOSIS — O24419 Gestational diabetes mellitus in pregnancy, unspecified control: Secondary | ICD-10-CM

## 2016-12-06 LAB — CBC WITH DIFFERENTIAL/PLATELET
BASOS ABS: 0.1 10*3/uL (ref 0–0.1)
Basophils Relative: 0 %
EOS ABS: 0.2 10*3/uL (ref 0–0.7)
EOS PCT: 1 %
HCT: 31.8 % — ABNORMAL LOW (ref 35.0–47.0)
HEMOGLOBIN: 10.8 g/dL — AB (ref 12.0–16.0)
LYMPHS ABS: 2.7 10*3/uL (ref 1.0–3.6)
Lymphocytes Relative: 21 %
MCH: 29.7 pg (ref 26.0–34.0)
MCHC: 33.8 g/dL (ref 32.0–36.0)
MCV: 87.7 fL (ref 80.0–100.0)
Monocytes Absolute: 1.5 10*3/uL — ABNORMAL HIGH (ref 0.2–0.9)
Monocytes Relative: 12 %
NEUTROS PCT: 66 %
Neutro Abs: 8.7 10*3/uL — ABNORMAL HIGH (ref 1.4–6.5)
PLATELETS: 260 10*3/uL (ref 150–440)
RBC: 3.63 MIL/uL — AB (ref 3.80–5.20)
RDW: 13.9 % (ref 11.5–14.5)
WBC: 13.2 10*3/uL — AB (ref 3.6–11.0)

## 2016-12-06 LAB — COMPREHENSIVE METABOLIC PANEL
ALBUMIN: 2.9 g/dL — AB (ref 3.5–5.0)
ALK PHOS: 201 U/L — AB (ref 38–126)
ALT: 14 U/L (ref 14–54)
ANION GAP: 7 (ref 5–15)
AST: 22 U/L (ref 15–41)
BUN: 6 mg/dL (ref 6–20)
CALCIUM: 8.8 mg/dL — AB (ref 8.9–10.3)
CHLORIDE: 108 mmol/L (ref 101–111)
CO2: 23 mmol/L (ref 22–32)
Creatinine, Ser: 0.49 mg/dL (ref 0.44–1.00)
GFR calc Af Amer: >60 mL/min (ref >60)
GFR calc non Af Amer: >60 mL/min (ref >60)
GLUCOSE: 85 mg/dL (ref 65–99)
Potassium: 3 mmol/L — ABNORMAL LOW (ref 3.5–5.1)
SODIUM: 138 mmol/L (ref 135–145)
Total Bilirubin: 0.3 mg/dL (ref 0.3–1.2)
Total Protein: 6.2 g/dL — ABNORMAL LOW (ref 6.5–8.1)

## 2016-12-06 LAB — URINALYSIS, COMPLETE (UACMP) WITH MICROSCOPIC
BILIRUBIN URINE: NEGATIVE
Glucose, UA: NEGATIVE mg/dL
Hgb urine dipstick: NEGATIVE
KETONES UR: 5 mg/dL — AB
NITRITE: NEGATIVE
PH: 7 (ref 5.0–8.0)
Protein, ur: NEGATIVE mg/dL
SPECIFIC GRAVITY, URINE: 1.004 — AB (ref 1.005–1.030)

## 2016-12-06 LAB — GLUCOSE, CAPILLARY: GLUCOSE-CAPILLARY: 109 mg/dL — AB (ref 65–99)

## 2016-12-06 LAB — AMYLASE: Amylase: 86 U/L (ref 28–100)

## 2016-12-06 MED ORDER — ONDANSETRON 8 MG PO TBDP: 8.0000 mg | ORAL_TABLET | Freq: Three times a day (TID) | ORAL | 0 refills | Status: DC | PRN

## 2016-12-06 MED ORDER — ONDANSETRON 4 MG PO TBDP
8.0000 mg | ORAL_TABLET | Freq: Three times a day (TID) | ORAL | Status: DC | PRN
  Administered 2016-12-06: 8 mg via ORAL
  Filled 2016-12-06: qty 2

## 2016-12-06 MED ORDER — POTASSIUM CHLORIDE CRYS ER 20 MEQ PO TBCR
20.0000 meq | EXTENDED_RELEASE_TABLET | Freq: Two times a day (BID) | ORAL | Status: DC
  Administered 2016-12-06: 20 meq via ORAL
  Filled 2016-12-06: qty 1

## 2016-12-06 MED ORDER — POTASSIUM CHLORIDE CRYS ER 20 MEQ PO TBCR: 20.0000 meq | EXTENDED_RELEASE_TABLET | Freq: Two times a day (BID) | ORAL | 0 refills | Status: DC

## 2016-12-06 NOTE — Discharge Summary (Signed)
Physician Obstetric Discharge Summary  Patient ID: PRINCE OLIVIER MRN: [id number] DOB/AGE: 1985-03-28 31 y.o.   Date of Admission: 12/06/2016  Date of Discharge: 12/06/2016  Admitting Diagnosis: Observation at [redacted]w[redacted]d Secondary Diagnosis: Hypokalemia  Discharge Diagnosis: No other diagnosis   Antepartum Procedures: NST    Brief Hospital Course   L&D OB Triage Note  Diamond EHLERTis a 31y.o. [id number] at 366w1dEDD Estimated Date of Delivery: 12/26/16 who presented to triage for complaints of vomiting and irregular contractions.  She was evaluated by the nurses with no significant findings for maternal distress, fetal distress, or labor Vital signs stable. An NST was performed and has been reviewed by CNM. She was treated with Zofran and able to tolerate PO hydration without difficulty .   NST INTERPRETATION: Indications: rule out uterine contractions  Mode: External Baseline Rate (A): 125 bpm Variability: Moderate Accelerations: 15 x 15 Decelerations: None  Contraction Frequency (min): 5 ctx in past hour     Impression: reactive   Plan: NST performed was reviewed and was found to be reactive. She was discharged home with bleeding/labor precautions. Patient was found to be mildly hypokalemic and PO replacement was ordered. Rx: Potassium chloride, see orders. Continue routine prenatal care. Follow up with CNM as previously scheduled.   Discharge Instructions: Per After Visit Summary.  Activity: Refer to After Visit Summary.  Diet: Regular.  Medications:  Allergies as of 12/06/2016      Reactions   Penicillins Anaphylaxis   12/2014: received ancef w/o complications Has patient had a PCN reaction causing immediate rash, facial/tongue/throat swelling, SOB or lightheadedness with hypotension: Yes Has patient had a PCN reaction causing severe rash involving mucus membranes or skin necrosis: Yes Has patient had a PCN reaction that required hospitalization  Yes Has patient had a PCN reaction occurring within the last 10 years: No If all of the above answers are "NO", then may proceed with Cephalosporin use.      Medication List    STOP taking these medications   cyclobenzaprine 10 MG tablet Commonly known as:  FLEXERIL     TAKE these medications   ACCU-CHEK FASTCLIX LANCETS Misc 1 each 4 (four) times daily by Percutaneous route.   blood glucose meter kit and supplies Kit Dispense based on patient and insurance preference. Use up to four times daily as directed. (FOR ICD-9 250.00, 250.01).   DICLEGIS PO Take 10 mg as needed by mouth (2 tablets as needed).   glucose blood test strip Commonly known as:  ACCU-CHEK GUIDE Use as instructed. One strip 4 times daily   hydrOXYzine 25 MG capsule Commonly known as:  VISTARIL Take 1 capsule (25 mg total) by mouth 3 (three) times daily as needed for itching.   multivitamin-prenatal 27-0.8 MG Tabs tablet Take 1 tablet by mouth daily at 12 noon.   ondansetron 8 MG disintegrating tablet Commonly known as:  ZOFRAN-ODT Take 1 tablet (8 mg total) every 8 (eight) hours as needed by mouth for nausea or vomiting.   potassium chloride SA 20 MEQ tablet Commonly known as:  K-DUR,KLOR-CON Take 1 tablet (20 mEq total) 2 (two) times daily for 3 days by mouth.   promethazine 25 MG tablet Commonly known as:  PHENERGAN Take 1 tablet (25 mg total) by mouth every 8 (eight) hours as needed for nausea or vomiting. Take 1/2 tab 3052mbefore taking each dose of Flagyl.      Outpatient follow up:  Postpartum contraception: bilateral tubal ligation  Discharged Condition: stable  Discharged to: home    Diona Fanti, CNM [facility], [facility]

## 2016-12-06 NOTE — OB Triage Note (Signed)
Patient presents in no apparent distress with c/o nausea and vomiting since 0700.  States she has had nausea throughout pregnancy, but has vomited everything today including water. Tool phenergan pill at 0800 and vomited it back up.   Urine specimen provided upon arrival.  Reports vaginal pressure, denies any vaginal spotting or bleeding.  Reports occasional contractions (approx 3/hour).  Decreased fetal movement today.  efm and toco applied.  fhr-stable, 120s.

## 2016-12-07 ENCOUNTER — Ambulatory Visit (INDEPENDENT_AMBULATORY_CARE_PROVIDER_SITE_OTHER): Payer: Medicaid Other | Admitting: Certified Nurse Midwife

## 2016-12-07 ENCOUNTER — Ambulatory Visit (INDEPENDENT_AMBULATORY_CARE_PROVIDER_SITE_OTHER): Payer: Medicaid Other

## 2016-12-07 ENCOUNTER — Encounter: Payer: Self-pay | Admitting: Certified Nurse Midwife

## 2016-12-07 ENCOUNTER — Ambulatory Visit: Payer: Medicaid Other

## 2016-12-07 VITALS — BP 126/81 | HR 61 | Wt 172.6 lb

## 2016-12-07 DIAGNOSIS — Z3493 Encounter for supervision of normal pregnancy, unspecified, third trimester: Secondary | ICD-10-CM

## 2016-12-07 LAB — POCT URINALYSIS DIPSTICK
Bilirubin, UA: NEGATIVE
Glucose, UA: NEGATIVE
Ketones, UA: NEGATIVE
Leukocytes, UA: NEGATIVE
NITRITE UA: NEGATIVE
RBC UA: NEGATIVE
Spec Grav, UA: 1.025 (ref 1.010–1.025)
UROBILINOGEN UA: 0.2 U/dL
pH, UA: 6.5 (ref 5.0–8.0)

## 2016-12-07 NOTE — Progress Notes (Signed)
ROB, doing well. No She has irregular contraction. NST today for GDM-diet controlled. NST reactive with baseline 155, accelerations present, decelerations absent. No contractions seen. SVE per pt request. 3-4 cm 80%, -2 station. Labor precautions reviewed. Blood sugars: fasting all normal, 2 hr pp one elevated at 133. Follow up 1 wk for NST and ROB.  Doreene BurkeAnnie Romello Hoehn, CNM

## 2016-12-07 NOTE — Progress Notes (Signed)
ROB- Pt states she has been having a lot of pressure

## 2016-12-07 NOTE — Patient Instructions (Signed)

## 2016-12-09 ENCOUNTER — Observation Stay
Admission: EM | Admit: 2016-12-09 | Discharge: 2016-12-09 | Disposition: A | Discharge status: Home / Self Care | Payer: Medicaid Other | Attending: Certified Nurse Midwife | Admitting: Certified Nurse Midwife

## 2016-12-09 ENCOUNTER — Encounter: Payer: Self-pay | Admitting: Certified Nurse Midwife

## 2016-12-09 ENCOUNTER — Telehealth: Payer: Self-pay

## 2016-12-09 DIAGNOSIS — O471 False labor at or after 37 completed weeks of gestation: Secondary | ICD-10-CM | POA: Diagnosis not present

## 2016-12-09 DIAGNOSIS — O24410 Gestational diabetes mellitus in pregnancy, diet controlled: Secondary | ICD-10-CM | POA: Insufficient documentation

## 2016-12-09 DIAGNOSIS — Z3A37 37 weeks gestation of pregnancy: Secondary | ICD-10-CM | POA: Insufficient documentation

## 2016-12-09 LAB — GLUCOSE, CAPILLARY: Glucose-Capillary: 93 mg/dL (ref 65–99)

## 2016-12-09 NOTE — Telephone Encounter (Signed)
Feeling a lot of pressure, difficult to walk, and decreased fetal movement. No contractions, no vaginal bleeding. Pt informed at first to come to office but provider has to go to hospital.(on call) so pt was advised to go to ER and they will take her up to L&D. L&D notified.

## 2016-12-09 NOTE — OB Triage Note (Signed)
L&D OB Triage Note  SUBJECTIVE Diamond Sutton is a 31 y.o. [id number] female at [redacted]w[redacted]d EDD Estimated Date of Delivery: 12/26/16 who presented to triage with complaints of increased vaginal pressure and contractions.   Obstetric History   G9   P6   T5   P1   A2   L6    SAB1   TAB0   Ectopic1   Multiple0   Live Births6     # Outcome Date GA Lbr Len/2nd Weight Sex Delivery Anes PTL Lv  9 Current           8 Ectopic 2018          7 Preterm 12/30/14 361w1d6 lb 0.7 oz (2.74 kg) M Vag-Spont EPI  LIV     Name: Diamond Sutton   Apgar1:  8                Apgar5: 9  6 Term 09/11/12 3724w0d lb 5 oz (3.317 kg) M Vag-Spont   LIV  5 Term 08/05/09 37w76w0dlb 14 oz (3.118 kg) F Vag-Spont   LIV  4 Term 03/19/07 40w07w0db 7 oz (3.374 kg) M Vag-Spont   LIV  3 Term 05/05/05 41w0d38w0d 11 oz (3.033 kg) M Vag-Spont   LIV  2 SAB 2006 [redacted]w[redacted]d [redacted]w[redacted]d     1 Term 05/05/02 [redacted]w[redacted]d  72w0d3 oz (3.09 kg) M Vag-Spont   LIV      Medications Prior to Admission  Medication Sig Dispense Refill Last Dose  . ACCU-CHEK FASTCLIX LANCETS MISC 1 each 4 (four) times daily by Percutaneous route. 100 each 12 12/08/2016 at Unknown time  . acetaminophen (TYLENOL) 325 MG tablet Take 650 mg by mouth every 6 (six) hours as needed.   12/09/2016 at Unknown time  . blood glucose meter kit and supplies KIT Dispense based on patient and insurance preference. Use up to four times daily as directed. (FOR ICD-9 250.00, 250.01). 1 each 0 12/08/2016 at Unknown time  . glucose blood (ACCU-CHEK GUIDE) test strip Use as instructed. One strip 4 times daily 100 each 12 12/08/2016 at Unknown time  . ondansetron (ZOFRAN-ODT) 8 MG disintegrating tablet Take 1 tablet (8 mg total) every 8 (eight) hours as needed by mouth for nausea or vomiting. 20 tablet 0 12/08/2016 at Unknown time  . potassium chloride SA (K-DUR,KLOR-CON) 20 MEQ tablet Take 1 tablet (20 mEq total) 2 (two) times daily for 3 days by mouth. 6 tablet 0 12/09/2016 at Unknown time   . Prenatal Vit-Fe Fumarate-FA (MULTIVITAMIN-PRENATAL) 27-0.8 MG TABS tablet Take 1 tablet by mouth daily at 12 noon. 90 each 4 12/08/2016 at Unknown time  . promethazine (PHENERGAN) 25 MG tablet Take 1 tablet (25 mg total) by mouth every 8 (eight) hours as needed for nausea or vomiting. Take 1/2 tab 30min be7m taking each dose of Flagyl. 30 tablet 1 12/08/2016 at Unknown time  . Doxylamine-Pyridoxine (DICLEGIS PO) Take 10 mg as needed by mouth (2 tablets as needed).   Not Taking at Unknown time  . hydrOXYzine (VISTARIL) 25 MG capsule Take 1 capsule (25 mg total) by mouth 3 (three) times daily as needed for itching. (Patient not taking: Reported on 12/09/2016) 30 capsule 0 Not Taking at Unknown time     OBJECTIVE  Nursing Evaluation:   BP 138/82 (BP Location: Right Arm)   Pulse 87   Temp 98.5 F (36.9 C) (Oral)  Resp 18   Ht '5\' 6"'  (1.676 m)   Wt 172 lb (78 kg)   LMP  (LMP Unknown)   BMI 27.76 kg/m    Findings:  Not in labor, no cervical change  NST was performed and has been reviewed by me. I was present and evaluated the patient in person.   NST INTERPRETATION: Category I  Mode: External Baseline Rate (A): 135 bpm Variability: Moderate Accelerations: 15 x 15 Decelerations: None     Contraction Frequency (min): 5-6  ASSESSMENT Impression:  1.  Pregnancy:  [id number] at [redacted]w[redacted]d, EDD Estimated Date of Delivery: 12/26/16 2.  NST:  reactive  PLAN 1. Reassurance given 2. Discharge home with standard labor precautions given to return to L&D or call the office for problems.  3. Continue routine prenatal care.  APhilip Aspen CNM

## 2016-12-11 ENCOUNTER — Other Ambulatory Visit: Payer: Self-pay

## 2016-12-11 ENCOUNTER — Encounter: Payer: Self-pay | Admitting: Registered Nurse

## 2016-12-11 ENCOUNTER — Inpatient Hospital Stay
Admission: EM | Admit: 2016-12-11 | Discharge: 2016-12-12 | DRG: 807 | Disposition: A | Discharge status: Home / Self Care | Payer: Medicaid Other | Attending: Certified Nurse Midwife | Admitting: Certified Nurse Midwife

## 2016-12-11 ENCOUNTER — Encounter
Admission: EM | Disposition: A | Discharge status: Home / Self Care | Payer: Self-pay | Source: Home / Self Care | Attending: Certified Nurse Midwife

## 2016-12-11 DIAGNOSIS — Z3A37 37 weeks gestation of pregnancy: Secondary | ICD-10-CM

## 2016-12-11 DIAGNOSIS — Z880 Allergy status to penicillin: Secondary | ICD-10-CM | POA: Diagnosis not present

## 2016-12-11 DIAGNOSIS — O24429 Gestational diabetes mellitus in childbirth, unspecified control: Secondary | ICD-10-CM | POA: Diagnosis present

## 2016-12-11 DIAGNOSIS — Z3483 Encounter for supervision of other normal pregnancy, third trimester: Secondary | ICD-10-CM | POA: Diagnosis present

## 2016-12-11 LAB — COMPREHENSIVE METABOLIC PANEL
ALK PHOS: 246 U/L — AB (ref 38–126)
ALT: 14 U/L (ref 14–54)
ANION GAP: 9 (ref 5–15)
AST: 26 U/L (ref 15–41)
Albumin: 3 g/dL — ABNORMAL LOW (ref 3.5–5.0)
BILIRUBIN TOTAL: 0.5 mg/dL (ref 0.3–1.2)
BUN: 9 mg/dL (ref 6–20)
CALCIUM: 8.7 mg/dL — AB (ref 8.9–10.3)
CO2: 21 mmol/L — ABNORMAL LOW (ref 22–32)
Chloride: 106 mmol/L (ref 101–111)
Creatinine, Ser: 0.62 mg/dL (ref 0.44–1.00)
GLUCOSE: 121 mg/dL — AB (ref 65–99)
POTASSIUM: 3.8 mmol/L (ref 3.5–5.1)
Sodium: 136 mmol/L (ref 135–145)
TOTAL PROTEIN: 6.5 g/dL (ref 6.5–8.1)

## 2016-12-11 LAB — CBC
HCT: 35.7 % (ref 35.0–47.0)
HEMOGLOBIN: 11.9 g/dL — AB (ref 12.0–16.0)
MCH: 29.5 pg (ref 26.0–34.0)
MCHC: 33.4 g/dL (ref 32.0–36.0)
MCV: 88.2 fL (ref 80.0–100.0)
PLATELETS: 267 10*3/uL (ref 150–440)
RBC: 4.05 MIL/uL (ref 3.80–5.20)
RDW: 14.4 % (ref 11.5–14.5)
WBC: 11.9 10*3/uL — ABNORMAL HIGH (ref 3.6–11.0)

## 2016-12-11 LAB — TYPE AND SCREEN
ABO/RH(D): O POS
ANTIBODY SCREEN: NEGATIVE

## 2016-12-11 SURGERY — LIGATION, FALLOPIAN TUBE, POSTPARTUM
Anesthesia: Choice | Laterality: Bilateral

## 2016-12-11 MED ORDER — IBUPROFEN 600 MG PO TABS
ORAL_TABLET | ORAL | Status: AC
  Administered 2016-12-11: 600 mg via ORAL
  Filled 2016-12-11: qty 1

## 2016-12-11 MED ORDER — FERROUS SULFATE 325 (65 FE) MG PO TABS
325.0000 mg | ORAL_TABLET | Freq: Every day | ORAL | Status: DC
  Administered 2016-12-11 – 2016-12-12 (×2): 325 mg via ORAL
  Filled 2016-12-11 (×2): qty 1

## 2016-12-11 MED ORDER — ONDANSETRON HCL 4 MG/2ML IJ SOLN: 4.0000 mg | Freq: Four times a day (QID) | INTRAMUSCULAR | Status: DC | PRN

## 2016-12-11 MED ORDER — MISOPROSTOL 200 MCG PO TABS
ORAL_TABLET | ORAL | Status: AC
  Filled 2016-12-11: qty 4

## 2016-12-11 MED ORDER — OXYCODONE-ACETAMINOPHEN 5-325 MG PO TABS
1.0000 | ORAL_TABLET | ORAL | Status: DC | PRN
  Administered 2016-12-11 – 2016-12-12 (×3): 1 via ORAL
  Filled 2016-12-11 (×3): qty 1

## 2016-12-11 MED ORDER — OXYCODONE-ACETAMINOPHEN 5-325 MG PO TABS
2.0000 | ORAL_TABLET | ORAL | Status: DC | PRN
  Administered 2016-12-11: 2 via ORAL
  Filled 2016-12-11: qty 2

## 2016-12-11 MED ORDER — OXYTOCIN 10 UNIT/ML IJ SOLN
10.0000 [IU] | Freq: Once | INTRAMUSCULAR | Status: DC
  Administered 2016-12-11: 10 [IU] via INTRAMUSCULAR

## 2016-12-11 MED ORDER — SIMETHICONE 80 MG PO CHEW: 80.0000 mg | CHEWABLE_TABLET | ORAL | Status: DC | PRN

## 2016-12-11 MED ORDER — OXYTOCIN 10 UNIT/ML IJ SOLN
INTRAMUSCULAR | Status: AC
  Administered 2016-12-11: 10 [IU] via INTRAMUSCULAR
  Filled 2016-12-11: qty 2

## 2016-12-11 MED ORDER — OXYTOCIN 40 UNITS IN LACTATED RINGERS INFUSION - SIMPLE MED
INTRAVENOUS | Status: AC
  Filled 2016-12-11: qty 1000

## 2016-12-11 MED ORDER — IBUPROFEN 600 MG PO TABS
600.0000 mg | ORAL_TABLET | Freq: Four times a day (QID) | ORAL | Status: DC
  Administered 2016-12-11 – 2016-12-12 (×6): 600 mg via ORAL
  Filled 2016-12-11 (×5): qty 1

## 2016-12-11 MED ORDER — METHYLERGONOVINE MALEATE 0.2 MG/ML IJ SOLN: 0.2000 mg | INTRAMUSCULAR | Status: DC | PRN

## 2016-12-11 MED ORDER — ONDANSETRON HCL 4 MG/2ML IJ SOLN: 4.0000 mg | INTRAMUSCULAR | Status: DC | PRN

## 2016-12-11 MED ORDER — SODIUM CHLORIDE FLUSH 0.9 % IV SOLN
INTRAVENOUS | Status: AC
  Filled 2016-12-11: qty 10

## 2016-12-11 MED ORDER — TETANUS-DIPHTH-ACELL PERTUSSIS 5-2.5-18.5 LF-MCG/0.5 IM SUSP: 0.5000 mL | Freq: Once | INTRAMUSCULAR | Status: DC

## 2016-12-11 MED ORDER — PRENATAL MULTIVITAMIN CH
1.0000 | ORAL_TABLET | Freq: Every day | ORAL | Status: DC
  Administered 2016-12-11 – 2016-12-12 (×2): 1 via ORAL
  Filled 2016-12-11 (×2): qty 1

## 2016-12-11 MED ORDER — LACTATED RINGERS IV SOLN: 500.0000 mL | INTRAVENOUS | Status: DC | PRN

## 2016-12-11 MED ORDER — SENNOSIDES-DOCUSATE SODIUM 8.6-50 MG PO TABS
2.0000 | ORAL_TABLET | ORAL | Status: DC
  Administered 2016-12-11: 2 via ORAL
  Filled 2016-12-11: qty 2

## 2016-12-11 MED ORDER — DIBUCAINE 1 % RE OINT: 1.0000 "application " | TOPICAL_OINTMENT | RECTAL | Status: DC | PRN

## 2016-12-11 MED ORDER — BUTORPHANOL TARTRATE 1 MG/ML IJ SOLN: 1.0000 mg | INTRAMUSCULAR | Status: DC | PRN

## 2016-12-11 MED ORDER — WITCH HAZEL-GLYCERIN EX PADS: 1.0000 "application " | MEDICATED_PAD | CUTANEOUS | Status: DC | PRN

## 2016-12-11 MED ORDER — SOD CITRATE-CITRIC ACID 500-334 MG/5ML PO SOLN: 30.0000 mL | ORAL | Status: DC | PRN

## 2016-12-11 MED ORDER — OXYCODONE-ACETAMINOPHEN 5-325 MG PO TABS
ORAL_TABLET | ORAL | Status: AC
  Administered 2016-12-11: 1 via ORAL
  Filled 2016-12-11: qty 1

## 2016-12-11 MED ORDER — LIDOCAINE HCL (PF) 1 % IJ SOLN: 30.0000 mL | INTRAMUSCULAR | Status: DC | PRN

## 2016-12-11 MED ORDER — DOCUSATE SODIUM 100 MG PO CAPS
100.0000 mg | ORAL_CAPSULE | Freq: Two times a day (BID) | ORAL | Status: DC
  Administered 2016-12-11 – 2016-12-12 (×2): 100 mg via ORAL
  Filled 2016-12-11 (×2): qty 1

## 2016-12-11 MED ORDER — METHYLERGONOVINE MALEATE 0.2 MG PO TABS: 0.2000 mg | ORAL_TABLET | ORAL | Status: DC | PRN

## 2016-12-11 MED ORDER — OXYTOCIN BOLUS FROM INFUSION: 500.0000 mL | Freq: Once | INTRAVENOUS | Status: DC

## 2016-12-11 MED ORDER — OXYTOCIN 40 UNITS IN LACTATED RINGERS INFUSION - SIMPLE MED: 2.5000 [IU]/h | INTRAVENOUS | Status: DC

## 2016-12-11 MED ORDER — BENZOCAINE-MENTHOL 20-0.5 % EX AERO: 1.0000 "application " | INHALATION_SPRAY | CUTANEOUS | Status: DC | PRN

## 2016-12-11 MED ORDER — LIDOCAINE HCL (PF) 1 % IJ SOLN
INTRAMUSCULAR | Status: AC
  Filled 2016-12-11: qty 30

## 2016-12-11 MED ORDER — ACETAMINOPHEN 325 MG PO TABS: 650.0000 mg | ORAL_TABLET | ORAL | Status: DC | PRN

## 2016-12-11 MED ORDER — COCONUT OIL OIL: 1.0000 "application " | TOPICAL_OIL | Status: DC | PRN

## 2016-12-11 MED ORDER — LACTATED RINGERS IV SOLN: INTRAVENOUS | Status: DC

## 2016-12-11 MED ORDER — AMMONIA AROMATIC IN INHA
RESPIRATORY_TRACT | Status: AC
  Filled 2016-12-11: qty 10

## 2016-12-11 MED ORDER — ONDANSETRON HCL 4 MG PO TABS: 4.0000 mg | ORAL_TABLET | ORAL | Status: DC | PRN

## 2016-12-11 SURGICAL SUPPLY — 18 items
BLADE SURG 15 STRL LF DISP TIS (BLADE) ×1 IMPLANT
BLADE SURG 15 STRL SS (BLADE) ×3
BNDG ADH 2 X3.75 FABRIC TAN LF (GAUZE/BANDAGES/DRESSINGS) ×3 IMPLANT
BNDG ADH XL 3.75X2 STRCH LF (GAUZE/BANDAGES/DRESSINGS) ×1
CHLORAPREP W/TINT 26ML (MISCELLANEOUS) ×3 IMPLANT
DRAPE LAPAROTOMY 77X122 PED (DRAPES) ×3 IMPLANT
GLOVE ORTHO TXT STRL SZ7.5 (GLOVE) ×3 IMPLANT
GOWN STRL REUS W/ TWL LRG LVL3 (GOWN DISPOSABLE) ×2 IMPLANT
GOWN STRL REUS W/TWL LRG LVL3 (GOWN DISPOSABLE) ×6
KIT RM TURNOVER CYSTO AR (KITS) ×3 IMPLANT
LABEL OR SOLS (LABEL) ×3 IMPLANT
NDL HYPO 25GX1X1/2 BEV (NEEDLE) ×1 IMPLANT
NEEDLE HYPO 25GX1X1/2 BEV (NEEDLE) ×3 IMPLANT
NS IRRIG 500ML POUR BTL (IV SOLUTION) ×3 IMPLANT
PACK BASIN MINOR ARMC (MISCELLANEOUS) ×3 IMPLANT
SUT VIC AB 4-0 PS2 18 (SUTURE) ×3 IMPLANT
SUT VICRYL 0 AB UR-6 (SUTURE) ×6 IMPLANT
SYR 10ML LL (SYRINGE) ×3 IMPLANT

## 2016-12-11 NOTE — Plan of Care (Signed)
Pt. Stable and within normal limits.

## 2016-12-11 NOTE — H&P (Signed)
History and Physical   HPI  Diamond Sutton is a 31 y.o. [id number] at 76w6dEstimated Date of Delivery: 12/26/16 who is being admitted for  labor management   OB History  Obstetric History   G9   P6   T5   P1   A2   L6    SAB1   TAB0   Ectopic1   Multiple0   Live Births6     # Outcome Date GA Lbr Len/2nd Weight Sex Delivery Anes PTL Lv  9 Current           8 Ectopic 2018          7 Preterm 12/30/14 337w1d6 lb 0.7 oz (2.74 kg) M Vag-Spont EPI  LIV     Name: WANOLIE, BIGNELL   Apgar1:  8                Apgar5: 9  6 Term 09/11/12 3711w0d lb 5 oz (3.317 kg) M Vag-Spont   LIV  5 Term 08/05/09 37w38w0dlb 14 oz (3.118 kg) F Vag-Spont   LIV  4 Term 03/19/07 40w060w0db 7 oz (3.374 kg) M Vag-Spont   LIV  3 Term 05/05/05 55w0d9w0d 11 oz (3.033 kg) M Vag-Spont   LIV  2 SAB 2006 [redacted]w[redacted]d [redacted]w[redacted]d     1 Term 05/05/02 [redacted]w[redacted]d  47w0d3 oz (3.09 kg) M Vag-Spont   LIV      PROBLEM LIST  Pregnancy complications or risks: Patient Active Problem List   Diagnosis Date Noted  . Labor and delivery, indication for care 12/11/2016  . Pregnancy 12/06/2016  . Indication for care in labor or delivery 12/06/2016  . Gestational diabetes 11/20/2016  . Itching 11/17/2016  . Back pain affecting pregnancy in third trimester 10/05/2016  . 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 Depression 2001  . Ectopic pregnancy   . Ovarian cyst      Past Surgical History:  Procedure Laterality Date  . DILATION AND CURETTAGE OF UTERUS     SAB     Medications      Medication List    ASK your doctor about these medications   ACCU-CHEK FASTCLIX LANCETS Misc 1 each 4 (four) times daily by Percutaneous route.   acetaminophen 325 MG tablet Commonly known as:   TYLENOL   blood glucose meter kit and supplies Kit Dispense based on patient and insurance preference. Use up to four times daily as directed. (FOR ICD-9 250.00, 250.01).   DICLEGIS PO   glucose blood test strip Commonly known as:  ACCU-CHEK GUIDE Use as instructed. One strip 4 times daily   hydrOXYzine 25 MG capsule Commonly known as:  VISTARIL Take 1 capsule (25 mg total) by mouth 3 (three) times daily as needed for itching.   multivitamin-prenatal 27-0.8 MG Tabs tablet Take 1 tablet by mouth daily at 12 noon.   ondansetron 8 MG disintegrating tablet Commonly known as:  ZOFRAN-ODT Take 1 tablet (8 mg total) every 8 (eight) hours as needed by mouth for nausea or vomiting.   potassium chloride SA 20 MEQ tablet Commonly known as:  K-DUR,KLOR-CON Take 1 tablet (20 mEq total) 2 (  two) times daily for 3 days by mouth.   promethazine 25 MG tablet Commonly known as:  PHENERGAN Take 1 tablet (25 mg total) by mouth every 8 (eight) hours as needed for nausea or vomiting. Take 1/2 tab 32mn before taking each dose of Flagyl.        Allergies  Penicillins  Review of Systems  Constitutional: negative Eyes: negative Ears, nose, mouth, throat, and face: negative Respiratory: negative Cardiovascular: negative Gastrointestinal: negative Genitourinary:negative Integument/breast: negative Hematologic/lymphatic: negative Musculoskeletal:negative Neurological: negative Behavioral/Psych: negative Endocrine: negative Allergic/Immunologic: negative  Physical Exam  BP (!) 148/98 (BP Location: Right Arm)   Pulse 75   Temp 97.9 F (36.6 C) (Oral)   Resp 20   Ht '5\' 6"'  (1.676 m)   Wt 172 lb (78 kg)   LMP  (LMP Unknown)   BMI 27.76 kg/m   Lungs:  CTA B Cardio: RRR without M/R/G Abd: Soft, gravid, NT Presentation: cephalic EXT: No C/C/ 1+ Edema DTRs: 2+ B CERVIX: 4cm  :  80%:   -2:    posterior:    soft  See Prenatal records for more detailed PE.     FHR:  Baseline:  135 bpm, Variability: Good {> 6 bpm), Accelerations: Reactive and Decelerations: Early  Toco: Uterine Contractions: Frequency: Every 1-3 minutes   Test Results  No results found for this or any previous visit (from the past 24 hour(s)). Group B Strep negative  Assessment   [id number] [id number] Date of Delivery: 12/26/16  The fetus is reassuring.   Patient Active Problem List   Diagnosis Date Noted  . Labor and delivery, indication for care 12/11/2016  . Pregnancy 12/06/2016  . Indication for care in labor or delivery 12/06/2016  . Gestational diabetes 11/20/2016  . Itching 11/17/2016  . Back pain affecting pregnancy in third trimester 10/05/2016  . 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 Admit to L&D :   expectant management 2. EFM:-- Category 1 3. Epidural if desired. Stadol for IV pain until epidural requested. 4. Admission labs  5.anticipate NSVD  AnPhilip AspenCNM

## 2016-12-12 LAB — RPR: RPR Ser Ql: NONREACTIVE

## 2016-12-12 MED ORDER — MEDROXYPROGESTERONE ACETATE 150 MG/ML IM SUSP
150.0000 mg | Freq: Once | INTRAMUSCULAR | Status: AC
  Administered 2016-12-12: 150 mg via INTRAMUSCULAR
  Filled 2016-12-12: qty 1

## 2016-12-12 MED ORDER — IBUPROFEN 600 MG PO TABS: 600.0000 mg | ORAL_TABLET | Freq: Four times a day (QID) | ORAL | 0 refills | Status: DC

## 2016-12-12 MED ORDER — DOCUSATE SODIUM 100 MG PO CAPS: 100.0000 mg | ORAL_CAPSULE | Freq: Two times a day (BID) | ORAL | 0 refills | Status: DC

## 2016-12-12 NOTE — Discharge Instructions (Signed)
Please call your doctor or return to the ER if you experience any chest pains, shortness of breath, dizziness, visual changes, fever greater than 101, any heavy bleeding (saturating more than 1 pad per hour), large clots, or foul smelling discharge, any worsening abdominal pain and cramping that is not controlled by pain medication, or any signs of postpartum depression. No tampons, enemas, douches, or sexual intercourse for 6 weeks. Also avoid tub baths, hot tubs, or swimming for 6 weeks.  °

## 2016-12-12 NOTE — Progress Notes (Signed)
Discharge order received from doctor. Depo given prior to discharge. Reviewed discharge instructions and prescriptions with patient and answered all questions. Follow up appointment instructions given. Patient verbalized understanding. ID bands checked. Patient discharged home with infant via wheelchair by nursing/auxillary.    Kaisey Huseby Garner, RN 

## 2016-12-12 NOTE — Final Progress Note (Signed)
Discharge Day SOAP Note:  Progress Note - Vaginal Delivery  Diamond Sutton is a 31 y.o. [phone] now PP day 1 s/p Vaginal, Spontaneous . Delivery was uncomplicated  Subjective  The patient has the following complaints: has no unusual complaints  Pain is controlled with current medications.   Patient is urinating without difficulty.  She is ambulating well.    Objective  Vital signs: BP 114/85 (BP Location: Left Arm)   Pulse 75   Temp 98.3 F (36.8 C) (Oral)   Resp 18   Ht '5\' 6"'  (1.676 m)   Wt 172 lb (78 kg)   LMP  (LMP Unknown)   SpO2 99%   BMI 27.76 kg/m   Physical Exam: Gen: NAD Fundus Fundal Tone: Firm  Lochia Amount: Scant  Perineum Appearance: Intact                Data Review Labs: CBC Latest Ref Rng & Units 12/11/2016 12/06/2016 10/05/2016  WBC 3.6 - 11.0 K/uL 11.9(H) 13.2(H) 10.0  Hemoglobin 12.0 - 16.0 g/dL 11.9(L) 10.8(L) 10.2(L)  Hematocrit 35.0 - 47.0 % 35.7 31.8(L) 29.8(L)  Platelets 150 - 440 K/uL 267 260 208   O POS  Assessment/Plan  Active Problems:   Labor and delivery, indication for care    Plan for discharge today.   Discharge Instructions: Per After Visit Summary. Activity: Advance as tolerated. Pelvic rest for 6 weeks.  Also refer to After Visit Summary Diet: Regular Medications:     Allergies as of 12/12/2016      Reactions   Penicillins Anaphylaxis   12/2014: received ancef w/o complications Has patient had a PCN reaction causing immediate rash, facial/tongue/throat swelling, SOB or lightheadedness with hypotension: Yes Has patient had a PCN reaction causing severe rash involving mucus membranes or skin necrosis: Yes Has patient had a PCN reaction that required hospitalization Yes Has patient had a PCN reaction occurring within the last 10 years: No If all of the above answers are "NO", then may proceed with Cephalosporin use.              Medication List     STOP taking these medications    ACCU-CHEK FASTCLIX LANCETS Misc   blood glucose meter kit and supplies Kit   DICLEGIS PO   glucose blood test strip Commonly known as:  ACCU-CHEK GUIDE   hydrOXYzine 25 MG capsule Commonly known as:  VISTARIL   ondansetron 8 MG disintegrating tablet Commonly known as:  ZOFRAN-ODT   potassium chloride SA 20 MEQ tablet Commonly known as:  K-DUR,KLOR-CON   promethazine 25 MG tablet Commonly known as:  PHENERGAN     TAKE these medications   acetaminophen 325 MG tablet Commonly known as:  TYLENOL Take 650 mg by mouth every 6 (six) hours as needed.   docusate sodium 100 MG capsule Commonly known as:  COLACE Take 1 capsule (100 mg total) by mouth 2 (two) times daily.   ibuprofen 600 MG tablet Commonly known as:  ADVIL,MOTRIN Take 1 tablet (600 mg total) by mouth every 6 (six) hours.   multivitamin-prenatal 27-0.8 MG Tabs tablet Take 1 tablet by mouth daily at 12 noon.      Outpatient follow up: 6 weeks for 2 hr glucose test and 6 wk postpartum visit Postpartum contraception: Pt did not have postpartum tubal because she did not want to remain NPO for procedure. She will have depo injection prior to discharge and want IUD at 6 wks.   Discharged Condition: good  Discharged  to: home  Newborn Data: Disposition:home with mother  Apgars: APGAR (1 MIN): 8   APGAR (5 MINS): 9   APGAR (10 MINS):    Baby Feeding: Breast    Philip Aspen, CNM  12/12/2016 11:34 AM

## 2016-12-12 NOTE — Discharge Summary (Signed)
Discharge Summary  Date of Admission: 12/11/2016  Date of Discharge: 12/12/2016  Admitting Diagnosis: Onset of Labor at [redacted]w[redacted]d Mode of Delivery: normal spontaneous vaginal delivery                 Discharge Diagnosis: No other diagnosis   Intrapartum Procedures: none   Post partum procedures: none  Complications: none                      Discharge Day SOAP Note:  Progress Note - Vaginal Delivery  Diamond COPPINGERis a 31y.o. [id number] PP day 1 s/p Vaginal, Spontaneous . Delivery was uncomplicated  Subjective  The patient has the following complaints: has no unusual complaints  Pain is controlled with current medications.   Patient is urinating without difficulty.  She is ambulating well.    Objective  Vital signs: BP 114/85 (BP Location: Left Arm)   Pulse 75   Temp 98.3 F (36.8 C) (Oral)   Resp 18   Ht '5\' 6"'  (1.676 m)   Wt 172 lb (78 kg)   LMP  (LMP Unknown)   SpO2 99%   BMI 27.76 kg/m   Physical Exam: Gen: NAD Fundus Fundal Tone: Firm  Lochia Amount: Scant  Perineum Appearance: Intact     Data Review Labs: CBC Latest Ref Rng & Units 12/11/2016 12/06/2016 10/05/2016  WBC 3.6 - 11.0 K/uL 11.9(H) 13.2(H) 10.0  Hemoglobin 12.0 - 16.0 g/dL 11.9(L) 10.8(L) 10.2(L)  Hematocrit 35.0 - 47.0 % 35.7 31.8(L) 29.8(L)  Platelets 150 - 440 K/uL 267 260 208   O POS  Assessment/Plan  Active Problems:   Labor and delivery, indication for care    Plan for discharge today.   Discharge Instructions: Per After Visit Summary. Activity: Advance as tolerated. Pelvic rest for 6 weeks.  Also refer to After Visit Summary Diet: Regular Medications: Allergies as of 12/12/2016      Reactions   Penicillins Anaphylaxis   12/2014: received ancef w/o complications Has patient had a PCN reaction causing immediate rash, facial/tongue/throat swelling, SOB or lightheadedness with hypotension: Yes Has patient had a PCN reaction causing severe  rash involving mucus membranes or skin necrosis: Yes Has patient had a PCN reaction that required hospitalization Yes Has patient had a PCN reaction occurring within the last 10 years: No If all of the above answers are "NO", then may proceed with Cephalosporin use.      Medication List    STOP taking these medications   ACCU-CHEK FASTCLIX LANCETS Misc   blood glucose meter kit and supplies Kit   DICLEGIS PO   glucose blood test strip Commonly known as:  ACCU-CHEK GUIDE   hydrOXYzine 25 MG capsule Commonly known as:  VISTARIL   ondansetron 8 MG disintegrating tablet Commonly known as:  ZOFRAN-ODT   potassium chloride SA 20 MEQ tablet Commonly known as:  K-DUR,KLOR-CON   promethazine 25 MG tablet Commonly known as:  PHENERGAN     TAKE these medications   acetaminophen 325 MG tablet Commonly known as:  TYLENOL Take 650 mg by mouth every 6 (six) hours as needed.   docusate sodium 100 MG capsule Commonly known as:  COLACE Take 1 capsule (100 mg total) by mouth 2 (two) times daily.   ibuprofen 600 MG tablet Commonly known as:  ADVIL,MOTRIN Take 1 tablet (600 mg total) by mouth every 6 (six) hours.   multivitamin-prenatal 27-0.8 MG Tabs tablet Take 1 tablet by mouth daily at 12  noon.      Outpatient follow up: 6 weeks for 2 hr glucose test and 6 wk postpartum visit Postpartum contraception: Pt did not have postpartum tubal because she did not want to remain NPO for procedure. She will have depo injection prior to discharge and want IUD at 6 wks.   Discharged Condition: good  Discharged to: home  Newborn Data: Disposition:home with mother  Apgars: APGAR (1 MIN): 8   APGAR (5 MINS): 9   APGAR (10 MINS):    Baby Feeding: Breast    Philip Aspen, CNM  12/12/2016 11:34 AM

## 2016-12-14 NOTE — Discharge Summary (Signed)
Physician Obstetric Discharge Summary  Patient ID: Diamond Sutton MRN: [id number] DOB/AGE: 33/08/1985 33 y.o.   Date of Admission: 12/09/2016  Date of Discharge: 12/09/2016  Admitting Diagnosis: Observation at 7262w6d  Secondary Diagnosis: Gestational diabetes diet controlled (A1)     Discharge Diagnosis: No other diagnosis   Antepartum Procedures: NST   Brief Hospital Course   L&D OB Triage Note  Diamond Malletasha D Chatwin is a 31 y.o. [id number] female at 6362w6d, EDD Estimated Date of Delivery: 12/26/16 who presented to triage for complaints of regular uterine contractions and pelvic pressure.  She was evaluated by the nurses with no significant findings for labor or fetal distress. Vital signs stable. An NST was performed and has been reviewed by CNM. No treatment was needed.    NST INTERPRETATION: Indications: rule out uterine contractions  Mode: External Baseline Rate (A): 130 bpm Variability: Moderate Accelerations: 15 x 15 Decelerations: None  Contraction Frequency (min): 1.5-6   Dilation: 4 Effacement (%): 80 Cervical Position: Posterior Station: -2 Exam by:: Northwest Airlineshompson. CNM  Impression: reactive   Plan: NST performed was reviewed and was found to be reactive. She was discharged home with bleeding/labor precautions.  Continue routine prenatal care. Follow up with CNM as previously scheduled.   Discharge Instructions: Per After Visit Summary.  Activity:Refer to After Visit Summary.  Diet: Regular.  Medications: Allergies as of 12/09/2016      Reactions   Penicillins Anaphylaxis   12/2014: received ancef w/o complications Has patient had a PCN reaction causing immediate rash, facial/tongue/throat swelling, SOB or lightheadedness with hypotension: Yes Has patient had a PCN reaction causing severe rash involving mucus membranes or skin necrosis: Yes Has patient had a PCN reaction that required hospitalization Yes Has patient had a PCN reaction occurring within the  last 10 years: No If all of the above answers are "NO", then may proceed with Cephalosporin use.      Medication List    ASK your doctor about these medications   acetaminophen 325 MG tablet Commonly known as:  TYLENOL Take 650 mg by mouth every 6 (six) hours as needed.   multivitamin-prenatal 27-0.8 MG Tabs tablet Take 1 tablet by mouth daily at 12 noon.      Outpatient follow up:  Postpartum contraception: bilateral tubal ligation  Discharged Condition: stable  Discharged to: home   Gunnar BullaJenkins Michelle Zeda Gangwer, [place]M

## 2016-12-15 ENCOUNTER — Other Ambulatory Visit: Payer: Self-pay

## 2016-12-15 ENCOUNTER — Encounter: Payer: Self-pay | Admitting: Obstetrics and Gynecology

## 2016-12-17 ENCOUNTER — Other Ambulatory Visit: Payer: Self-pay | Admitting: Certified Nurse Midwife

## 2016-12-24 ENCOUNTER — Other Ambulatory Visit: Payer: Self-pay

## 2016-12-24 MED ORDER — GLUCOSE BLOOD VI STRP: ORAL_STRIP | 12 refills | Status: DC

## 2017-01-22 ENCOUNTER — Encounter: Payer: Self-pay | Admitting: Certified Nurse Midwife

## 2017-01-22 ENCOUNTER — Other Ambulatory Visit: Payer: Self-pay

## 2017-01-25 ENCOUNTER — Other Ambulatory Visit: Payer: Self-pay

## 2017-01-25 ENCOUNTER — Encounter: Payer: Self-pay | Admitting: Certified Nurse Midwife

## 2017-02-26 IMAGING — US US OB TRANSVAGINAL
1 series · 13 of 28 positions shown · non-contrast
Comparison: None.

CLINICAL DATA: Left-sided abdominal pain x2 days, pregnant, beta
HCG 560

EXAM:
OBSTETRIC <14 WK US AND TRANSVAGINAL OB US
TECHNIQUE: Both transabdominal and transvaginal ultrasound examinations were
performed for complete evaluation of the gestation as well as the
maternal uterus, adnexal regions, and pelvic cul-de-sac.
Transvaginal technique was performed to assess early pregnancy.

[Series 1: us ob transvaginal · 0.19mm/px · 13 of 110 slices shown]
[im 5/110]
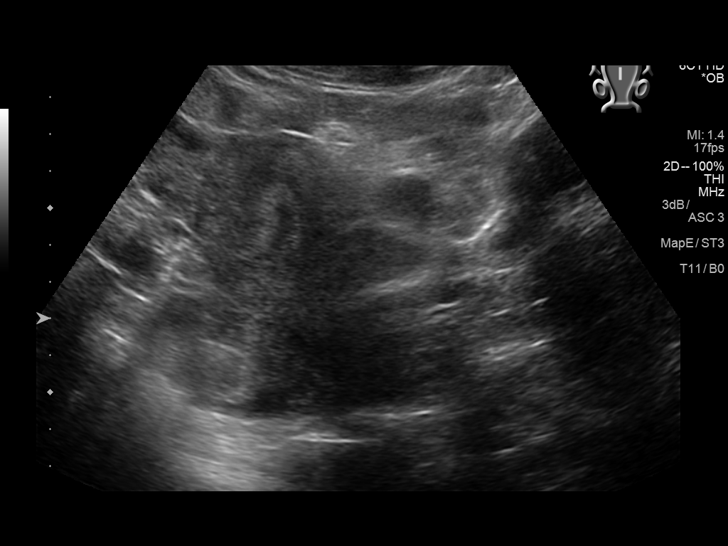
[im 13/110]
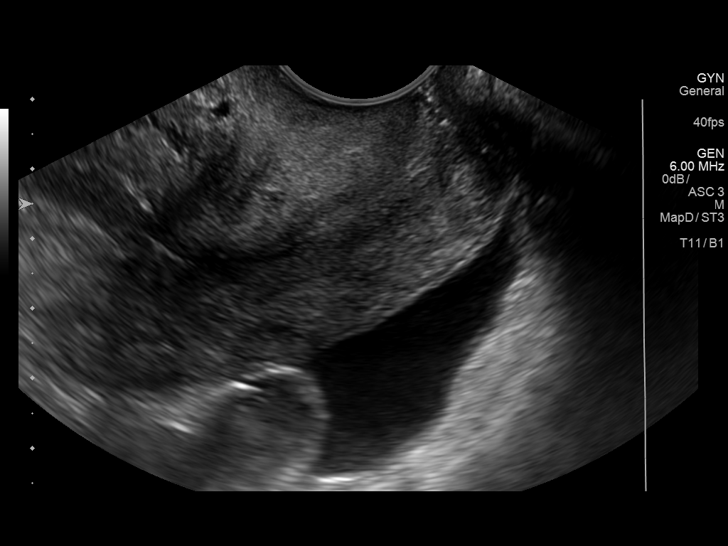
[im 21/110]
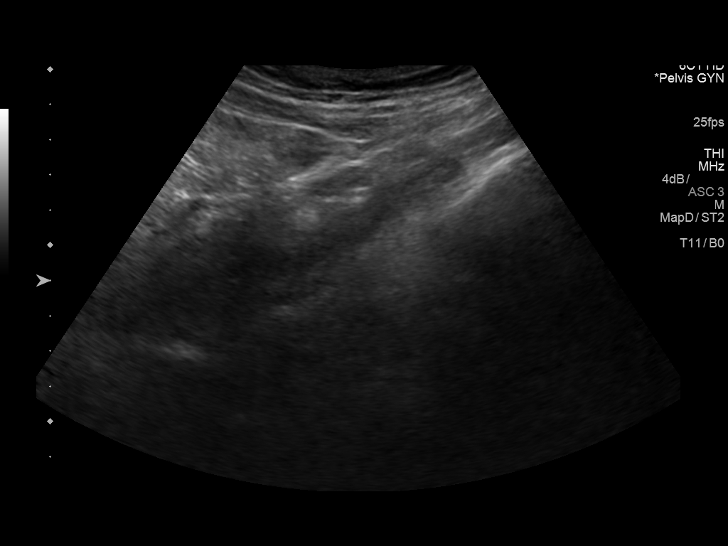
[im 29/110]
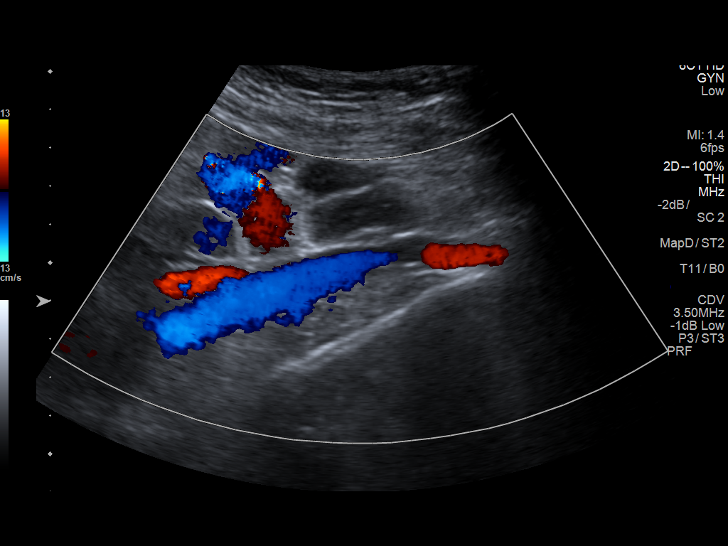
[im 37/110]
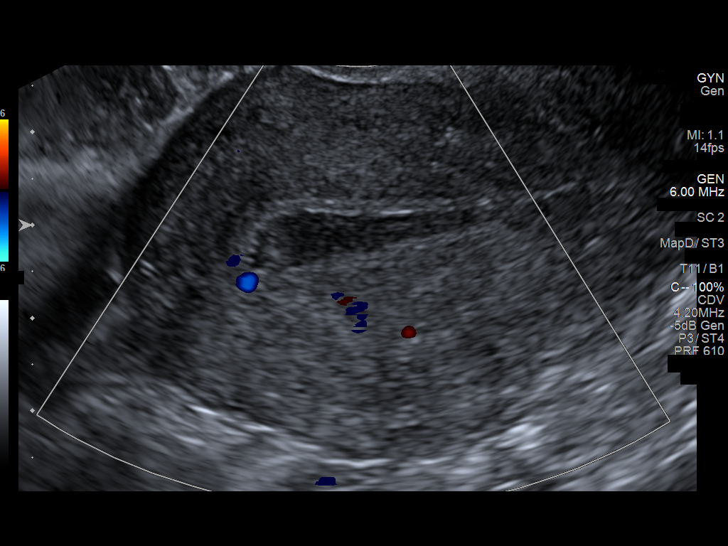
[im 45/110]
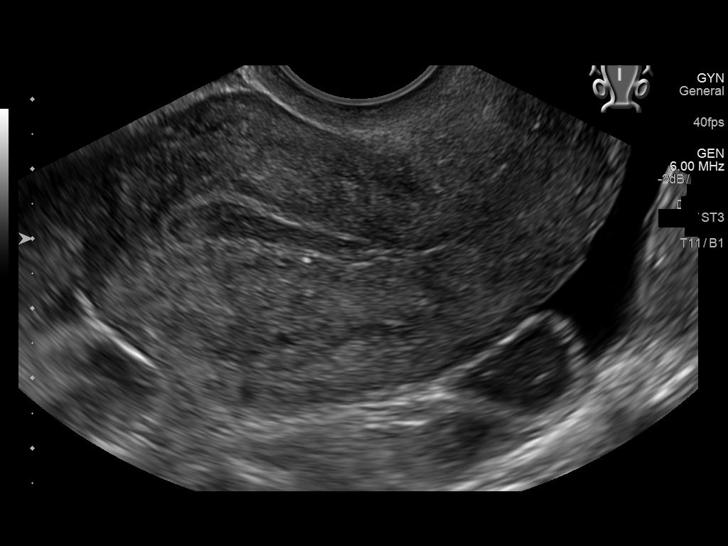
[im 57/110]
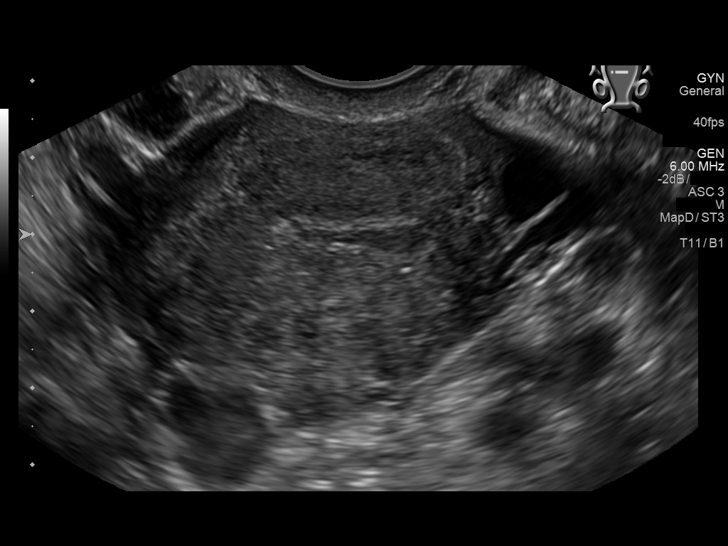
[im 65/110]
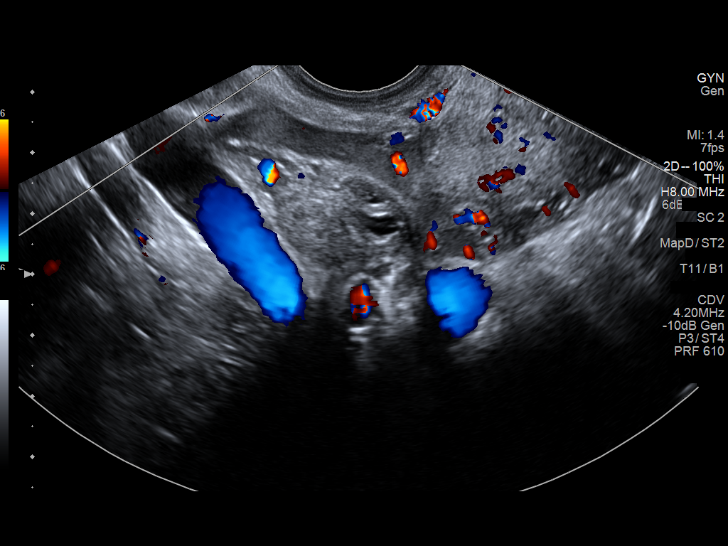
[im 73/110]
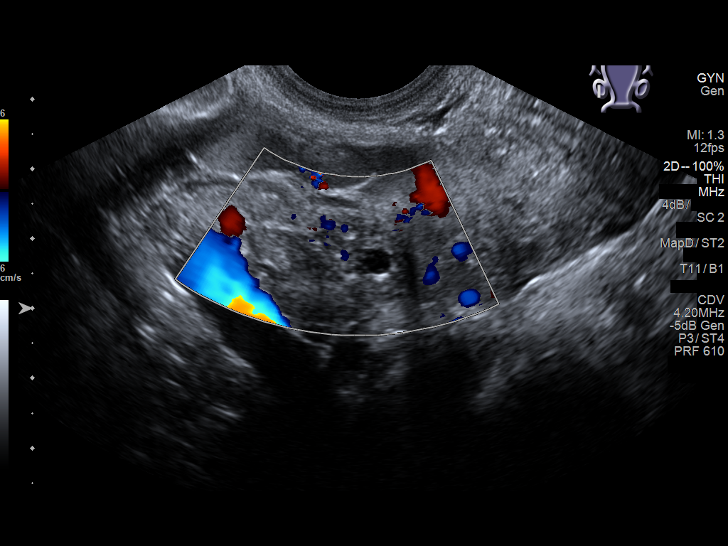
[im 81/110]
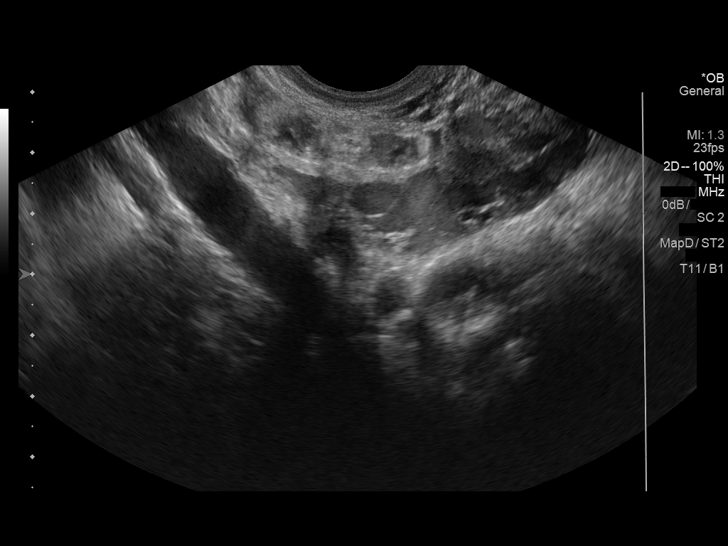
[im 89/110]
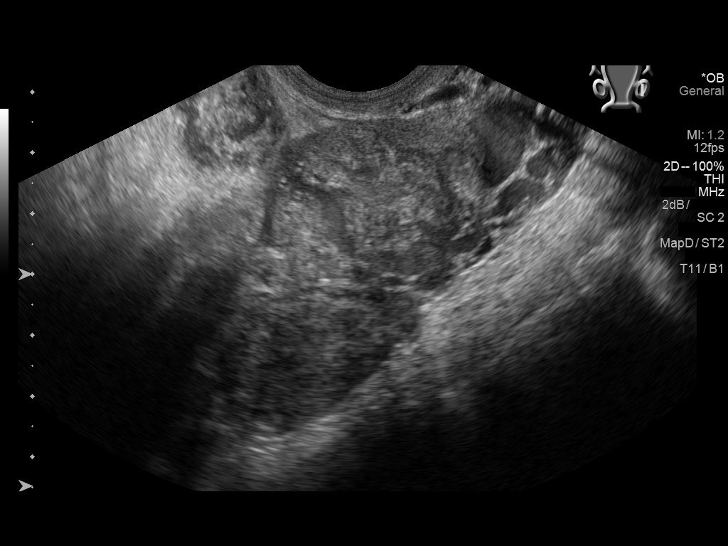
[im 97/110]
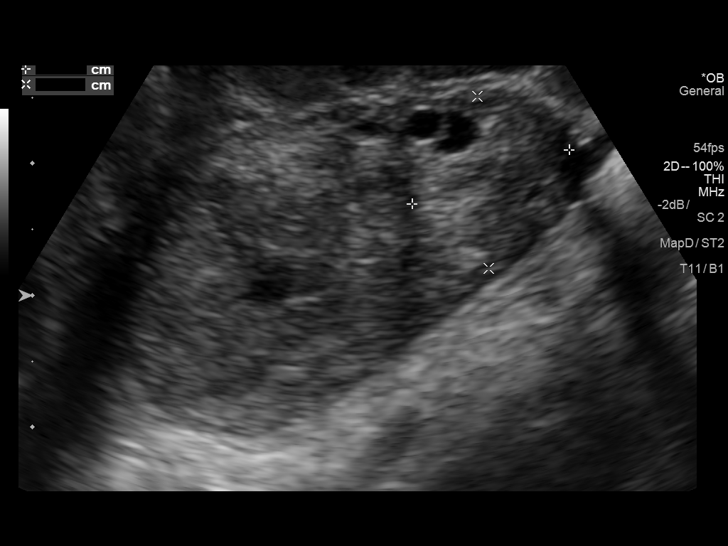
[im 105/110]
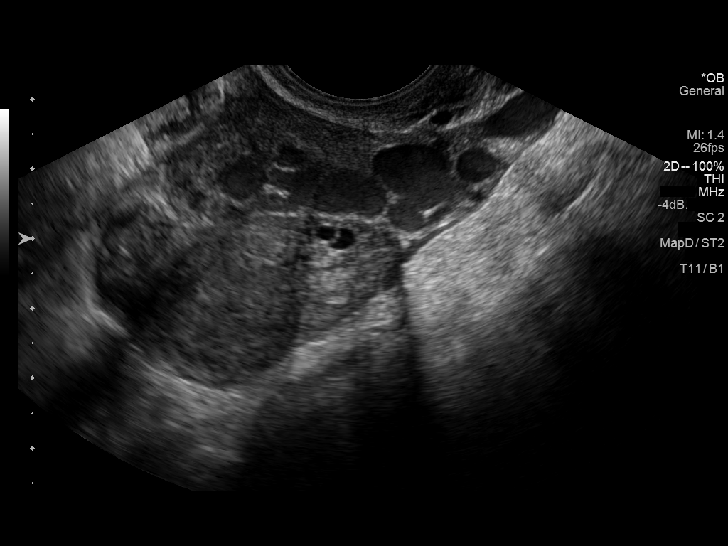

[13 of 28 positions shown; findings below may reference images not displayed]

FINDINGS: Intrauterine gestational sac: None

Yolk sac:  Not Visualized.

Embryo:  Not Visualized.

Maternal uterus/adnexae: Complex fluid/hemorrhage within the
endometrial cavity.

Right ovary is within normal limits.

Suspected left ovarian corpus luteal cyst.

Adjacent 1.2 x 1.1 cm lesion in the left adnexa with possible yolk
sac, worrisome for left adnexal ectopic pregnancy, although
indeterminate.

Small volume pelvic ascites.
IMPRESSION: No IUP is visualized.

Findings suspicious for left adnexal ectopic pregnancy, although
indeterminate. Consider serial beta HCG with repeat pelvic
sonography if clinically warranted.

These results were called by telephone at the time of interpretation
on 03/04/2016 at [DATE] to Geno Caine, who verbally acknowledged
these results.

## 2017-04-15 ENCOUNTER — Emergency Department
Admission: EM | Admit: 2017-04-15 | Discharge: 2017-04-15 | Disposition: A | Discharge status: Home / Self Care | Payer: Medicaid Other | Attending: Emergency Medicine | Admitting: Emergency Medicine

## 2017-04-15 ENCOUNTER — Encounter: Payer: Self-pay | Admitting: Emergency Medicine

## 2017-04-15 ENCOUNTER — Other Ambulatory Visit: Payer: Self-pay

## 2017-04-15 DIAGNOSIS — Z87891 Personal history of nicotine dependence: Secondary | ICD-10-CM | POA: Insufficient documentation

## 2017-04-15 DIAGNOSIS — K0889 Other specified disorders of teeth and supporting structures: Secondary | ICD-10-CM

## 2017-04-15 DIAGNOSIS — K047 Periapical abscess without sinus: Secondary | ICD-10-CM | POA: Insufficient documentation

## 2017-04-15 MED ORDER — TRAMADOL HCL 50 MG PO TABS: 50.0000 mg | ORAL_TABLET | Freq: Four times a day (QID) | ORAL | 0 refills | Status: DC | PRN

## 2017-04-15 MED ORDER — CLINDAMYCIN HCL 150 MG PO CAPS: 300.0000 mg | ORAL_CAPSULE | Freq: Three times a day (TID) | ORAL | 0 refills | Status: DC

## 2017-04-15 MED ORDER — IBUPROFEN 800 MG PO TABS: 800.0000 mg | ORAL_TABLET | Freq: Three times a day (TID) | ORAL | 0 refills | Status: DC | PRN

## 2017-04-15 NOTE — Discharge Instructions (Addendum)
OPTIONS FOR DENTAL FOLLOW UP CARE ° °[place] [facility] and [facility] - Local Safety Net Dental Clinics °http://www.ncdhhs.gov/dph/oralhealth/services/safetynetclinics.htm °  °[facility] (336-562-3123) ° °[facility] [place] ([phone]) ° °[facility] ([phone] ext [phone]) ° °[facility] ([phone]) ° °[facility] ([phone]) °This clinic caters to the indigent population and is on a lottery system. °Location: °[facility], [address], [place] °Clinic Hours: °Wednesdays from 6pm - 9pm, patients seen by a lottery system. °For dates, call or go to www.med.[facility].edu/[facility]/patients/Dental-[facility] °Services: °Cleanings, fillings and simple extractions. °Payment Options: °DENTAL WORK IS FREE OF CHARGE. Bring proof of income or support. °Best way to get seen: °Arrive at 5:15 pm - this is a lottery, NOT first come/first serve, so arriving earlier will not increase your chances of being seen. °  °  °[facility] °[phone] °Select option 1 for emergencies °  °Location: °[facility], [address], [place] °Clinic Hours: °No walk-ins accepted - call the day before to schedule an appointment. °Check in times are 9:30 am and 1:30 pm. °Services: °Simple extractions, temporary fillings, pulpectomy/pulp debridement, uncomplicated abscess drainage. °Payment Options: °PAYMENT IS DUE AT THE TIME OF SERVICE.  Fee is usually $100-200, additional surgical procedures (e.g. abscess drainage) may be extra. °Cash, checks, Visa/MasterCard accepted.  Can file Medicaid if patient is covered for dental - patient should call case worker to check. °No discount for [facility] patients. °Best way to get seen: °MUST call the day before and get onto the schedule. Can usually be seen the next 1-2 days. No walk-ins accepted. °  °  °[facility] °[phone] °   °Location: °[facility], [address], [place] °Clinic Hours: °M, W, Th, F 8am or 1:30pm, Tues 9a or 1:30 - first come/first served. °Services: °Simple extractions, temporary fillings, uncomplicated abscess drainage.  You do not need to be an [place] resident. °Payment Options: °PAYMENT IS DUE AT THE TIME OF SERVICE. °Dental insurance, otherwise sliding scale - bring proof of income or support. °Depending on income and treatment needed, cost is usually $50-200. °Best way to get seen: °Arrive early as it is first come/first served. °  °  °[facility] °[phone] °  °Location: °[address] °Clinic Hours: °Mon-Thu 8a-5p °Services: °Most basic dental services including extractions and fillings. °Payment Options: °PAYMENT IS DUE AT THE TIME OF SERVICE. °Sliding scale, up to 50% off - bring proof if income or support. °Medicaid with dental option accepted. °Best way to get seen: °Call to schedule an appointment, can usually be seen within 2 weeks OR they will try to see walk-ins - show up at 8a or 2p (you may have to wait). °  °  °[facility] °919-245-2435 °[place] RESIDENTS ONLY °  °Location: °[facility], [address], [place], [place] [address] °Clinic Hours: By appointment only. °Monday - Thursday 8am-5pm, Friday 8am-12pm °Services: Cleanings, fillings, extractions. °Payment Options: °PAYMENT IS DUE AT THE TIME OF SERVICE. °Cash, Visa or MasterCard. Sliding scale - $30 minimum per service. °Best way to get seen: °Come in to office, complete packet and make an appointment - need proof of income °or support monies for each household member and proof of [place] residence. °Usually takes about a month to get in. °  °  °[facility] °[phone] °  °Location: °[address]   [place] °Clinic Hours: Walk-in Urgent Care Dental Services are offered Monday-Friday  mornings only. °The numbers of emergencies accepted daily is limited to the number of °providers available. °Maximum 15 - Mondays, Wednesdays & Thursdays °Maximum 10 - Tuesdays & Fridays °Services: °You do not need to be a [place] resident to be seen for a dental emergency. °Emergencies are defined as pain, swelling, abnormal bleeding, or dental trauma. Walkins will receive x-rays if needed. °NOTE: Dental cleaning is not an emergency. °Payment Options: °PAYMENT IS DUE AT THE TIME OF SERVICE. °Minimum co-pay is $40.00 for uninsured patients. °Minimum co-pay is $3.00 for Medicaid with dental coverage. °Dental Insurance is accepted and must be presented at time of visit. °Medicare does not cover dental. °Forms of payment: Cash, credit card, checks. °Best way to get seen: °If not previously registered with the clinic, walk-in dental registration begins at 7:15 am and is on a first come/first serve basis. °If previously registered with the clinic, call to make an appointment. °  °  °[facility] °[phone] °[place] RESIDENTS ONLY °  °Location: °[address], [place], [place] °Clinic Hours: °Mon-Thu 10a-2p °Services: Extractions only! °Payment Options: °FREE (donations accepted) - bring proof of income or support °Best way to get seen: °Call and schedule an appointment OR come at 8am on the 1st Monday of every month (except for holidays) when it is first come/first served. °  °  °[facility] °[phone] °  °Location: °[address], [place] °Clinic Hours: °Friday mornings °Services, Payment Options, Best way to get seen: °Call for info °

## 2017-04-15 NOTE — ED Triage Notes (Signed)
Here for dental pain to right lower molar X 1 week. Some drainage per pt.  No fevers. Does not have dentist.

## 2017-04-15 NOTE — ED Provider Notes (Signed)
[facility] Emergency Department Provider Note  ____________________________________________   First MD Initiated Contact with Patient 04/15/17 1148     (approximate)  I have reviewed the triage vital signs and the nursing notes.   HISTORY  Chief Complaint Dental Pain    HPI Diamond Sutton is a 32 y.o. female resents emergency department complaining of dental pain for the past 2-3 days.  She states that she thinks her wisdom tooth is coming in the gum is been swollen and draining.  She denies any fever or chills.  She denies any chest pain shortness of breath.  She does not have a regular dentist  Past Medical History:  Diagnosis Date  . Depression 2001  . Ectopic pregnancy   . Ovarian cyst     Patient Active Problem List   Diagnosis Date Noted  . Labor and delivery, indication for care 12/11/2016  . Pregnancy 12/06/2016  . Indication for care in labor or delivery 12/06/2016  . Gestational diabetes 11/20/2016  . Itching 11/17/2016  . Back pain affecting pregnancy in third trimester 10/05/2016  . 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 DILATION AND CURETTAGE OF UTERUS     SAB    Prior to Admission medications   Medication Sig Start Date End Date Taking? Authorizing Provider  clindamycin (CLEOCIN) 150 MG capsule Take 2 capsules (300 mg total) by mouth 3 (three) times daily. 04/15/17   Faythe Ghee, PA-C  glucose blood test strip Use as directed up to four times a day 12/24/16   Gunnar Bulla, CNM  ibuprofen (ADVIL,MOTRIN) 800 MG tablet Take 1 tablet (800 mg total) by mouth every 8 (eight) hours as needed. 04/15/17   Fisher, Roselyn Bering, PA-C  traMADol (ULTRAM) 50 MG tablet Take 1 tablet (50 mg total) by mouth every 6 (six) hours as needed. 04/15/17   Faythe Ghee, PA-C    Allergies Penicillins  Family History  Problem Relation Age of Onset  . Diabetes Mother   . Breast cancer  Neg Hx   . Ovarian cancer Neg Hx   . Colon cancer Neg Hx   . Heart disease Neg Hx     Social History Social History   Tobacco Use  . Smoking status: Former Smoker    Packs/day: 0.25    Years: 15.00    Pack years: 3.75    Types: Cigarettes    Last attempt to quit: 05/08/2016    Years since quitting: 0.9  . Smokeless tobacco: Never Used  . Tobacco comment: pt states she "has to have one every now and then"  Substance Use Topics  . Alcohol use: No    Comment: drinks on weekends  . Drug use: No    Review of Systems  Constitutional: No fever/chills Eyes: No visual changes. ENT: No sore throat.  Positive for dental pain Respiratory: Denies cough Genitourinary: Negative for dysuria. Musculoskeletal: Negative for back pain. Skin: Negative for rash.    ____________________________________________   PHYSICAL EXAM:  VITAL SIGNS: ED Triage Vitals  Enc Vitals Group     BP 04/15/17 1101 (!) 134/100     Pulse Rate 04/15/17 1101 91     Resp 04/15/17 1101 16     Temp 04/15/17 1101 98.6 F (37 C)     Temp Source 04/15/17 1101 Oral     SpO2 04/15/17 1101 98 %     Weight 04/15/17 1059 145 lb (65.8 kg)  Height 04/15/17 1059 5\' 6"  (1.676 m)     Head Circumference --      Peak Flow --      Pain Score 04/15/17 1059 8     Pain Loc --      Pain Edu? --      Excl. in GC? --     Constitutional: Alert and oriented. Well appearing and in no acute distress. Eyes: Conjunctivae are normal.  Head: Atraumatic. Nose: No congestion/rhinnorhea. Mouth/Throat: Mucous membranes are moist.  Positive for swelling at the right lower molar.  No drainage is appreciated.  The area is tender to palpation. Neck: Is supple, no lymphadenopathy is noted Cardiovascular: Normal rate, regular rhythm.  Heart sounds are normal Respiratory: Normal respiratory effort.  No retractions lungs clear to auscultation GU: deferred Musculoskeletal: FROM all extremities, warm and well perfused Neurologic:   Normal speech and language.  Skin:  Skin is warm, dry and intact. No rash noted. Psychiatric: Mood and affect are normal. Speech and behavior are normal.  ____________________________________________   LABS (all labs ordered are listed, but only abnormal results are displayed)  Labs Reviewed - No data to display ____________________________________________   ____________________________________________  RADIOLOGY    ____________________________________________   PROCEDURES  Procedure(s) performed: No  Procedures    ____________________________________________   INITIAL IMPRESSION / ASSESSMENT AND PLAN / ED COURSE  Pertinent labs & imaging results that were available during my care of the patient were reviewed by me and considered in my medical decision making (see chart for details).  Patient is 32 year old female presents emergency department complaining of dental pain.  On physical exam the right lower molar has swelling around the gumline.  Diagnosis is acute dental pain with questionable abscess.  Patient was given a prescription for clindamycin, ibuprofen, and tramadol 50 mg #15 no refill.  She was also given a list of dental clinics.  She was instructed to follow-up with a dentist.  She states she understands.  She was discharged in stable condition     As part of my medical decision making, I reviewed the following data within the electronic MEDICAL RECORD NUMBER Nursing notes reviewed and incorporated, Notes from prior ED visits and [place] Controlled Substance Database  ____________________________________________   FINAL CLINICAL IMPRESSION(S) / ED DIAGNOSES  Final diagnoses:  Pain, dental  Dental abscess      NEW MEDICATIONS STARTED DURING THIS VISIT:  New Prescriptions   CLINDAMYCIN (CLEOCIN) 150 MG CAPSULE    Take 2 capsules (300 mg total) by mouth 3 (three) times daily.   IBUPROFEN (ADVIL,MOTRIN) 800 MG TABLET    Take 1 tablet (800 mg total) by  mouth every 8 (eight) hours as needed.   TRAMADOL (ULTRAM) 50 MG TABLET    Take 1 tablet (50 mg total) by mouth every 6 (six) hours as needed.     Note:  This document was prepared using Dragon voice recognition software and may include unintentional dictation errors.    Faythe GheeFisher, Susan W, PA-C 04/15/17 1207    Jene EveryKinner, Robert, MD 04/15/17 1309

## 2017-04-15 NOTE — ED Notes (Signed)
See triage note  Presents with dental pain for the past 2-3 days. States she thinks it is on right side  Some gumline swelling

## 2017-06-17 ENCOUNTER — Other Ambulatory Visit: Payer: Self-pay

## 2017-06-17 ENCOUNTER — Emergency Department
Admission: EM | Admit: 2017-06-17 | Discharge: 2017-06-17 | Disposition: A | Discharge status: Home / Self Care | Payer: Self-pay | Attending: Emergency Medicine | Admitting: Emergency Medicine

## 2017-06-17 DIAGNOSIS — J069 Acute upper respiratory infection, unspecified: Secondary | ICD-10-CM | POA: Insufficient documentation

## 2017-06-17 DIAGNOSIS — F17210 Nicotine dependence, cigarettes, uncomplicated: Secondary | ICD-10-CM | POA: Insufficient documentation

## 2017-06-17 DIAGNOSIS — Z79899 Other long term (current) drug therapy: Secondary | ICD-10-CM | POA: Insufficient documentation

## 2017-06-17 DIAGNOSIS — B9789 Other viral agents as the cause of diseases classified elsewhere: Secondary | ICD-10-CM | POA: Insufficient documentation

## 2017-06-17 MED ORDER — BENZONATATE 100 MG PO CAPS: ORAL_CAPSULE | ORAL | 0 refills | Status: DC

## 2017-06-17 MED ORDER — FLUTICASONE PROPIONATE 50 MCG/ACT NA SUSP: 2.0000 | Freq: Every day | NASAL | 0 refills | Status: DC

## 2017-06-17 MED ORDER — CETIRIZINE-PSEUDOEPHEDRINE ER 5-120 MG PO TB12: 1.0000 | ORAL_TABLET | Freq: Two times a day (BID) | ORAL | 1 refills | Status: DC

## 2017-06-17 NOTE — ED Provider Notes (Signed)
[facility] Emergency Department Provider Note ____________________________________________  Time seen: 1029  I have reviewed the triage vital signs and the nursing notes.  HISTORY  Chief Complaint  Sore Throat  HPI Diamond Sutton is a 32 y.o. female presents to the ED for a 2-day complaint of sore throat, cough, generalized body aches, mild headache.  Patient also describes copiously draining nose with postnasal drip and sinus drainage. The interim fevers, chills, sweats patient also denies any chest pain, shortness of breath, or vomiting.  She is without any known sick contacts, recent travel, or other exposures.  She has been taking Robitussin over-the-counter without significant benefit.  Past Medical History:  Diagnosis Date  . Depression 2001  . Ectopic pregnancy   . Ovarian cyst     Patient Active Problem List   Diagnosis Date Noted  . Labor and delivery, indication for care 12/11/2016  . Pregnancy 12/06/2016  . Indication for care in labor or delivery 12/06/2016  . Gestational diabetes 11/20/2016  . Itching 11/17/2016  . Back pain affecting pregnancy in third trimester 10/05/2016  . 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 DILATION AND CURETTAGE OF UTERUS     SAB    Prior to Admission medications   Medication Sig Start Date End Date Taking? Authorizing Provider  benzonatate (TESSALON PERLES) 100 MG capsule Take 1-2 tabs TID prn cough 06/17/17   Abimbola Aki, Charlesetta Ivory, PA-C  cetirizine-pseudoephedrine (ZYRTEC-D) 5-120 MG tablet Take 1 tablet by mouth 2 (two) times daily. 06/17/17   Gwendolyn Mclees, Charlesetta Ivory, PA-C  clindamycin (CLEOCIN) 150 MG capsule Take 2 capsules (300 mg total) by mouth 3 (three) times daily. 04/15/17   Fisher, Roselyn Bering, PA-C  fluticasone (FLONASE) 50 MCG/ACT nasal spray Place 2 sprays into both nostrils daily. 06/17/17   Markees Carns, Charlesetta Ivory, PA-C  glucose blood test strip Use  as directed up to four times a day 12/24/16   Lawhorn, Vanessa Hendrix, CNM  ibuprofen (ADVIL,MOTRIN) 800 MG tablet Take 1 tablet (800 mg total) by mouth every 8 (eight) hours as needed. 04/15/17   Fisher, Roselyn Bering, PA-C  traMADol (ULTRAM) 50 MG tablet Take 1 tablet (50 mg total) by mouth every 6 (six) hours as needed. 04/15/17   Faythe Ghee, PA-C    Allergies Penicillins  Family History  Problem Relation Age of Onset  . Diabetes Mother   . Breast cancer Neg Hx   . Ovarian cancer Neg Hx   . Colon cancer Neg Hx   . Heart disease Neg Hx     Social History Social History   Tobacco Use  . Smoking status: Current Every Day Smoker    Packs/day: 0.50    Years: 15.00    Pack years: 7.50    Types: Cigarettes    Last attempt to quit: 05/08/2016    Years since quitting: 1.1  . Smokeless tobacco: Never Used  . Tobacco comment: pt states she "has to have one every now and then"  Substance Use Topics  . Alcohol use: No    Comment: drinks on weekends  . Drug use: No    Review of Systems  Constitutional: Negative for fever. Eyes: Negative for visual changes. ENT: Positive for sore throat runny nose, and sinus drainage. Cardiovascular: Negative for chest pain. Respiratory: Negative for shortness of breath. Musculoskeletal: Negative for back pain. Skin: Negative for rash. Neurological: Negative for headaches, focal weakness or numbness. ____________________________________________  PHYSICAL  EXAM:  VITAL SIGNS: ED Triage Vitals  Enc Vitals Group     BP 06/17/17 0948 (!) 158/90     Pulse Rate 06/17/17 0948 (!) 107     Resp 06/17/17 0948 16     Temp 06/17/17 0948 99.5 F (37.5 C)     Temp Source 06/17/17 0948 Oral     SpO2 06/17/17 0948 100 %     Weight 06/17/17 0949 165 lb (74.8 kg)     Height 06/17/17 0949  (1.676 m)     Head Circumference --      Peak Flow --      Pain Score 06/17/17 0948 7     Pain Loc --      Pain Edu? --      Excl. in GC? --      Constitutional: Alert and oriented. Well appearing and in no distress. Head: Normocephalic and atraumatic. Eyes: Conjunctivae are normal. PERRL. Normal extraocular movements Ears: Canals clear. TMs intact bilaterally. Nose: No congestion/epistaxis.  Mucous membranes are pink, moist, and without edema or enlargement.  Copious clear rhinorrhea is appreciated. Mouth/Throat: Mucous membranes are moist.  Uvula is midline and tonsils are not visualized.  No oropharyngeal erythema, edema, or exudates are noted.  There is injection of the oropharyngeal vessels noted. Neck: Supple. No thyromegaly. Hematological/Lymphatic/Immunological: No cervical lymphadenopathy. Cardiovascular: Normal rate, regular rhythm. Normal distal pulses. Respiratory: Normal respiratory effort. No wheezes/rales/rhonchi. Musculoskeletal: Nontender with normal range of motion in all extremities.  Neurologic:  Normal gait without ataxia. Normal speech and language. No gross focal neurologic deficits are appreciated. Skin:  Skin is warm, dry and intact. No rash noted. ____________________________________________  INITIAL IMPRESSION / ASSESSMENT AND PLAN / ED COURSE  Patient with ED evaluation of 2-day complaint of sore throat and mild generalized body aches as well as mild headache.  Patient denies any interim fevers, chills, sweats.  Symptoms are more consistent with a likely viral etiology.  She will be discharged with prescriptions for Tessalon Perles, Flonase, and Zyrtec-D.  She will follow with primary care provider or local community clinics.  A work note is provided for 2 days as requested. ____________________________________________  FINAL CLINICAL IMPRESSION(S) / ED DIAGNOSES  Final diagnoses:  Viral URI with cough     Brandice Busser, Charlesetta Ivory, PA-C 06/17/17 1802  Minna Antis, MD 06/18/17 1137

## 2017-06-17 NOTE — ED Triage Notes (Signed)
Pt states she has sore throat that started 2 days - pt c/o generalized body aches and headache

## 2017-06-17 NOTE — Discharge Instructions (Addendum)
Take the prescription meds as directed. Drink plenty of fluids to reduce cough. Follow-up with [facility] for continued symptoms.

## 2017-08-20 ENCOUNTER — Emergency Department
Admission: EM | Admit: 2017-08-20 | Discharge: 2017-08-20 | Disposition: A | Discharge status: Home / Self Care | Payer: Self-pay | Attending: Emergency Medicine | Admitting: Emergency Medicine

## 2017-08-20 ENCOUNTER — Emergency Department: Payer: Self-pay

## 2017-08-20 ENCOUNTER — Encounter: Payer: Self-pay | Admitting: Medical Oncology

## 2017-08-20 ENCOUNTER — Other Ambulatory Visit: Payer: Self-pay

## 2017-08-20 DIAGNOSIS — R197 Diarrhea, unspecified: Secondary | ICD-10-CM | POA: Insufficient documentation

## 2017-08-20 DIAGNOSIS — F17210 Nicotine dependence, cigarettes, uncomplicated: Secondary | ICD-10-CM | POA: Insufficient documentation

## 2017-08-20 DIAGNOSIS — R112 Nausea with vomiting, unspecified: Secondary | ICD-10-CM | POA: Insufficient documentation

## 2017-08-20 LAB — COMPREHENSIVE METABOLIC PANEL
ALK PHOS: 56 U/L (ref 38–126)
ALT: 23 U/L (ref 0–44)
AST: 23 U/L (ref 15–41)
Albumin: 4.2 g/dL (ref 3.5–5.0)
Anion gap: 6 (ref 5–15)
BUN: 10 mg/dL (ref 6–20)
CALCIUM: 8.7 mg/dL — AB (ref 8.9–10.3)
CO2: 23 mmol/L (ref 22–32)
Chloride: 111 mmol/L (ref 98–111)
Creatinine, Ser: 0.79 mg/dL (ref 0.44–1.00)
Glucose, Bld: 114 mg/dL — ABNORMAL HIGH (ref 70–99)
Potassium: 3.3 mmol/L — ABNORMAL LOW (ref 3.5–5.1)
SODIUM: 140 mmol/L (ref 135–145)
Total Bilirubin: 0.5 mg/dL (ref 0.3–1.2)
Total Protein: 7.4 g/dL (ref 6.5–8.1)

## 2017-08-20 LAB — CBC
HCT: 37.6 % (ref 35.0–47.0)
Hemoglobin: 13 g/dL (ref 12.0–16.0)
MCH: 30.6 pg (ref 26.0–34.0)
MCHC: 34.6 g/dL (ref 32.0–36.0)
MCV: 88.6 fL (ref 80.0–100.0)
Platelets: 313 10*3/uL (ref 150–440)
RBC: 4.24 MIL/uL (ref 3.80–5.20)
RDW: 13.7 % (ref 11.5–14.5)
WBC: 7.6 10*3/uL (ref 3.6–11.0)

## 2017-08-20 LAB — URINALYSIS, COMPLETE (UACMP) WITH MICROSCOPIC
Bacteria, UA: NONE SEEN
Bilirubin Urine: NEGATIVE
GLUCOSE, UA: NEGATIVE mg/dL
HGB URINE DIPSTICK: NEGATIVE
Ketones, ur: NEGATIVE mg/dL
LEUKOCYTES UA: NEGATIVE
Nitrite: NEGATIVE
PH: 6 (ref 5.0–8.0)
PROTEIN: NEGATIVE mg/dL
SPECIFIC GRAVITY, URINE: 1.026 (ref 1.005–1.030)

## 2017-08-20 LAB — LIPASE, BLOOD: LIPASE: 28 U/L (ref 11–51)

## 2017-08-20 LAB — POCT PREGNANCY, URINE: Preg Test, Ur: NEGATIVE

## 2017-08-20 MED ORDER — ONDANSETRON 4 MG PO TBDP
4.0000 mg | ORAL_TABLET | Freq: Once | ORAL | Status: AC
  Administered 2017-08-20: 4 mg via ORAL
  Filled 2017-08-20: qty 1

## 2017-08-20 MED ORDER — IOHEXOL 300 MG/ML  SOLN
100.0000 mL | Freq: Once | INTRAMUSCULAR | Status: AC | PRN
  Administered 2017-08-20: 100 mL via INTRAVENOUS

## 2017-08-20 MED ORDER — ONDANSETRON 4 MG PO TBDP: 4.0000 mg | ORAL_TABLET | Freq: Four times a day (QID) | ORAL | 0 refills | Status: DC | PRN

## 2017-08-20 MED ORDER — IBUPROFEN 600 MG PO TABS
600.0000 mg | ORAL_TABLET | ORAL | Status: AC
  Administered 2017-08-20: 600 mg via ORAL
  Filled 2017-08-20: qty 1

## 2017-08-20 MED ORDER — IOPAMIDOL (ISOVUE-300) INJECTION 61%: 100.0000 mL | Freq: Once | INTRAVENOUS | Status: DC | PRN

## 2017-08-20 MED ORDER — IOHEXOL 300 MG/ML  SOLN
30.0000 mL | Freq: Once | INTRAMUSCULAR | Status: AC | PRN
  Administered 2017-08-20: 30 mL via ORAL

## 2017-08-20 NOTE — ED Provider Notes (Signed)
[facility] Emergency Department Provider Note   ____________________________________________   First MD Initiated Contact with Patient 08/20/17 [phone]     (approximate)  I have reviewed the triage vital signs and the nursing notes.   HISTORY  Chief Complaint Abdominal Pain and Flank Pain    HPI Diamond Sutton is a 32 y.o. female presents for evaluation of abdominal pain  Patient repeats for about 4 days now she is been experiencing nausea with 2-3 loose somewhat watery nonbloody stools daily.  She has been feeling slightly fatigued, and also reports that her son is being evaluated with her is also had some congestion nasally and slightly looser stools.  She denies chills but felt like she was warm or had a slight fever.  Reports the pain comes and goes somewhat crampy in nature, associated with loose stools.  She has had some nausea and felt like she was going to vomit but has not.  No chest pain or trouble breathing.  No pain or burning with urination.  Denies pregnancy, had an Implanon placed just a couple of days ago which is healing well  Pain and discomfort but is present is primarily loaded in the lower right abdomen.    Past Medical History:  Diagnosis Date  . Depression 2001  . Ectopic pregnancy   . Ovarian cyst     Patient Active Problem List   Diagnosis Date Noted  . Labor and delivery, indication for care 12/11/2016  . Pregnancy 12/06/2016  . Indication for care in labor or delivery 12/06/2016  . Gestational diabetes 11/20/2016  . Itching 11/17/2016  . Back pain affecting pregnancy in third trimester 10/05/2016  . 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 DILATION AND CURETTAGE OF UTERUS     SAB    Prior to Admission medications   Medication Sig Start Date End Date Taking? Authorizing Provider  glucose blood test strip Use as directed up to four times a day 12/24/16  Yes Lawhorn,  Vanessa DurhamJenkins Michelle, CNM  benzonatate (TESSALON PERLES) 100 MG capsule Take 1-2 tabs TID prn cough Patient not taking: Reported on 08/20/2017 06/17/17   Menshew, Charlesetta IvoryJenise V Bacon, PA-C  cetirizine-pseudoephedrine (ZYRTEC-D) 5-120 MG tablet Take 1 tablet by mouth 2 (two) times daily. Patient not taking: Reported on 08/20/2017 06/17/17   Menshew, Charlesetta IvoryJenise V Bacon, PA-C  clindamycin (CLEOCIN) 150 MG capsule Take 2 capsules (300 mg total) by mouth 3 (three) times daily. Patient not taking: Reported on 08/20/2017 04/15/17   Faythe GheeFisher, Susan W, PA-C  fluticasone [facility](FLONASE) 50 MCG/ACT nasal spray Place 2 sprays into both nostrils daily. Patient not taking: Reported on 08/20/2017 06/17/17   Menshew, Charlesetta IvoryJenise V Bacon, PA-C  ibuprofen (ADVIL,MOTRIN) 800 MG tablet Take 1 tablet (800 mg total) by mouth every 8 (eight) hours as needed. Patient not taking: Reported on 08/20/2017 04/15/17   Faythe GheeFisher, Susan W, PA-C  ondansetron (ZOFRAN ODT) 4 MG disintegrating tablet Take 1 tablet (4 mg total) by mouth every 6 (six) hours as needed for nausea or vomiting. 08/20/17   Sharyn CreamerQuale, Mark, MD  traMADol (ULTRAM) 50 MG tablet Take 1 tablet (50 mg total) by mouth every 6 (six) hours as needed. Patient not taking: Reported on 08/20/2017 04/15/17   Faythe GheeFisher, Susan W, PA-C    Allergies Penicillins  Family History  Problem Relation Age of Onset  . Diabetes Mother   . Breast cancer Neg Hx   . Ovarian cancer Neg Hx   .  Colon cancer Neg Hx   . Heart disease Neg Hx     Social History Social History   Tobacco Use  . Smoking status: Current Every Day Smoker    Packs/day: 0.50    Years: 15.00    Pack years: 7.50    Types: Cigarettes    Last attempt to quit: 05/08/2016    Years since quitting: 1.2  . Smokeless tobacco: Never Used  . Tobacco comment: pt states she "has to have one every now and then"  Substance Use Topics  . Alcohol use: No    Comment: drinks on weekends  . Drug use: No    Review of Systems Constitutional: No chills but some  fatigue Eyes: No visual changes. ENT: No sore throat. Cardiovascular: Denies chest pain. Respiratory: Denies shortness of breath. Gastrointestinal: No constipation. Genitourinary: Negative for dysuria.  Denies vaginal symptoms or bleeding.  No vaginal discharge. Musculoskeletal: Negative for back pain. Skin: Negative for rash. Neurological: Negative for headaches, focal weakness or numbness.    ____________________________________________   PHYSICAL EXAM:  VITAL SIGNS: ED Triage Vitals  Enc Vitals Group     BP 08/20/17 0853 (!) 147/97     Pulse Rate 08/20/17 0853 72     Resp 08/20/17 0853 15     Temp 08/20/17 0853 99 F (37.2 C)     Temp Source 08/20/17 0853 Oral     SpO2 08/20/17 0853 99 %     Weight 08/20/17 0848 165 lb (74.8 kg)     Height 08/20/17 0905 5\' 6"  (1.676 m)     Head Circumference --      Peak Flow --      Pain Score 08/20/17 0847 7     Pain Loc --      Pain Edu? --      Excl. in GC? --     Constitutional: Alert and oriented. Well appearing and in no acute distress.  She is very pleasant. Eyes: Conjunctivae are normal. Head: Atraumatic. Nose: No congestion/rhinnorhea. Mouth/Throat: Mucous membranes are moist. Neck: No stridor.   Cardiovascular: Normal rate, regular rhythm. Grossly normal heart sounds.  Good peripheral circulation. Respiratory: Normal respiratory effort.  No retractions. Lungs CTAB. Gastrointestinal: Soft and some mild tenderness is denoted only in the right lower quadrant, slightly located in the region of McBurney's point but not associated with any processing or peritonitis.  No rebound discomfort or involuntary guarding.  Negative Murphy.  No pain or discomfort on the left side of the abdomen or epigastric/periumbilical regions... No distention. Musculoskeletal: No lower extremity tenderness nor edema. Neurologic:  Normal speech and language. No gross focal neurologic deficits are appreciated.  Skin:  Skin is warm, dry and intact. No  rash noted. Psychiatric: Mood and affect are normal. Speech and behavior are normal.  ____________________________________________   LABS (all labs ordered are listed, but only abnormal results are displayed)  Labs Reviewed  COMPREHENSIVE METABOLIC PANEL - Abnormal; Notable for the following components:      Result Value   Potassium 3.3 (*)    Glucose, Bld 114 (*)    Calcium 8.7 (*)    All other components within normal limits  URINALYSIS, COMPLETE (UACMP) WITH MICROSCOPIC - Abnormal; Notable for the following components:   Color, Urine YELLOW (*)    APPearance CLEAR (*)    All other components within normal limits  LIPASE, BLOOD  CBC  POC URINE PREG, ED  POCT PREGNANCY, URINE   ____________________________________________  EKG   ____________________________________________  RADIOLOGY  Ct Abdomen Pelvis W Contrast  Result Date: 08/20/2017 CLINICAL DATA:  Abdominal pain EXAM: CT ABDOMEN AND PELVIS WITH CONTRAST TECHNIQUE: Multidetector CT imaging of the abdomen and pelvis was performed using the standard protocol following bolus administration of intravenous contrast. CONTRAST:  OMNIPAQUE IOHEXOL 300 MG/ML  SOLN COMPARISON:  02/20/2016 FINDINGS: Lower chest: No acute abnormality. Hepatobiliary: No focal liver abnormality is seen. No gallstones, gallbladder wall thickening, or biliary dilatation. Pancreas: Unremarkable. No pancreatic ductal dilatation or surrounding inflammatory changes. Spleen: Normal in size without focal abnormality. Adrenals/Urinary Tract: Adrenal glands are unremarkable. Kidneys are normal, without renal calculi, focal lesion, or hydronephrosis. Bladder is unremarkable. Stomach/Bowel: Stomach is within normal limits. Appendix appears normal. No evidence of bowel wall thickening, distention, or inflammatory changes. Vascular/Lymphatic: No significant vascular findings are present. No enlarged abdominal or pelvic lymph nodes. Reproductive: Uterus and  bilateral adnexa are unremarkable. Other: No abdominal wall hernia or abnormality. No abdominopelvic ascites. Musculoskeletal: No acute or significant osseous findings. IMPRESSION: No acute abnormality noted. Electronically Signed   By: Alcide Clever M.D.   On: 08/20/2017 12:48    CT scan reviewed negative for acute findings.  Appendix appears normal.  Normal bilateral adnexa. ____________________________________________   PROCEDURES  Procedure(s) performed: None  Procedures  Critical Care performed: No  ____________________________________________   INITIAL IMPRESSION / ASSESSMENT AND PLAN / ED COURSE  Pertinent labs & imaging results that were available during my care of the patient were reviewed by me and considered in my medical decision making (see chart for details).  Differential diagnosis includes but is not limited to, abdominal perforation, aortic dissection, cholecystitis, appendicitis, diverticulitis, colitis, esophagitis/gastritis, kidney stone, pyelonephritis, urinary tract infection, aortic aneurysm. All are considered in decision and treatment plan. Based upon the patient's presentation and risk factors, I suspect this is likely viral in etiology given the associated nausea and loose watery stools and patient's presentation and symptoms.  By the time I saw her she reported her pain was relatively mild, but given the notation in the right lower quadrant obtain CT imaging to exclude appendicitis.  No ovarian or gynecologic symptoms reported.  ----------------------------------------- 3:09 PM on 08/20/2017 -----------------------------------------  Patient reports some of the discomfort and crampiness his return, have ordered some ibuprofen and Zofran for her and she reports it is crampy discomfort.  No vomiting, and appears well with normal hemodynamics save some mild elevated blood pressure.         ____________________________________________   FINAL CLINICAL  IMPRESSION(S) / ED DIAGNOSES  Final diagnoses:  Nausea vomiting and diarrhea      NEW MEDICATIONS STARTED DURING THIS VISIT:  New Prescriptions   ONDANSETRON (ZOFRAN ODT) 4 MG DISINTEGRATING TABLET    Take 1 tablet (4 mg total) by mouth every 6 (six) hours as needed for nausea or vomiting.     Note:  This document was prepared using Dragon voice recognition software and may include unintentional dictation errors.     Sharyn Creamer, MD 08/20/17 [phone]

## 2017-08-20 NOTE — ED Notes (Signed)
MD aware pt is requesting pain medication.  

## 2017-08-20 NOTE — ED Triage Notes (Signed)
Pt reports lower abd pain that radiates around to her back that began 4 days ago. Pt reports nausea and diarrhea.

## 2017-08-20 NOTE — Discharge Instructions (Addendum)
You were seen in the emergency room for abdominal pain. It is important that you follow up closely with your primary care doctor in the next couple of days. ° ° °Please return to the emergency room right away if you are to develop a fever, severe nausea, your pain becomes severe or worsens, you are unable to keep food down, begin vomiting any dark or bloody fluid, you develop any dark or bloody stools, feel dehydrated, or other new concerns or symptoms arise. ° ° °

## 2017-09-23 ENCOUNTER — Emergency Department
Admission: EM | Admit: 2017-09-23 | Discharge: 2017-09-23 | Disposition: A | Discharge status: Home / Self Care | Payer: Self-pay | Attending: Emergency Medicine | Admitting: Emergency Medicine

## 2017-09-23 ENCOUNTER — Encounter: Payer: Self-pay | Admitting: Emergency Medicine

## 2017-09-23 ENCOUNTER — Other Ambulatory Visit: Payer: Self-pay

## 2017-09-23 DIAGNOSIS — R55 Syncope and collapse: Secondary | ICD-10-CM | POA: Insufficient documentation

## 2017-09-23 DIAGNOSIS — Z79899 Other long term (current) drug therapy: Secondary | ICD-10-CM | POA: Insufficient documentation

## 2017-09-23 DIAGNOSIS — F17210 Nicotine dependence, cigarettes, uncomplicated: Secondary | ICD-10-CM | POA: Insufficient documentation

## 2017-09-23 DIAGNOSIS — I10 Essential (primary) hypertension: Secondary | ICD-10-CM | POA: Insufficient documentation

## 2017-09-23 HISTORY — DX: Essential (primary) hypertension: I10

## 2017-09-23 LAB — URINALYSIS, COMPLETE (UACMP) WITH MICROSCOPIC
BILIRUBIN URINE: NEGATIVE
Bacteria, UA: NONE SEEN
Glucose, UA: NEGATIVE mg/dL
Hgb urine dipstick: NEGATIVE
Ketones, ur: NEGATIVE mg/dL
Leukocytes, UA: NEGATIVE
NITRITE: NEGATIVE
PH: 6 (ref 5.0–8.0)
Protein, ur: NEGATIVE mg/dL
SPECIFIC GRAVITY, URINE: 1.024 (ref 1.005–1.030)

## 2017-09-23 LAB — CBC
HCT: 37.8 % (ref 35.0–47.0)
Hemoglobin: 13 g/dL (ref 12.0–16.0)
MCH: 30.5 pg (ref 26.0–34.0)
MCHC: 34.3 g/dL (ref 32.0–36.0)
MCV: 88.9 fL (ref 80.0–100.0)
PLATELETS: 321 10*3/uL (ref 150–440)
RBC: 4.25 MIL/uL (ref 3.80–5.20)
RDW: 14.1 % (ref 11.5–14.5)
WBC: 7.3 10*3/uL (ref 3.6–11.0)

## 2017-09-23 LAB — BASIC METABOLIC PANEL
Anion gap: 7 (ref 5–15)
BUN: 11 mg/dL (ref 6–20)
CO2: 24 mmol/L (ref 22–32)
CREATININE: 0.77 mg/dL (ref 0.44–1.00)
Calcium: 8.9 mg/dL (ref 8.9–10.3)
Chloride: 111 mmol/L (ref 98–111)
GFR calc Af Amer: >60 mL/min (ref >60)
Glucose, Bld: 99 mg/dL (ref 70–99)
Potassium: 3.4 mmol/L — ABNORMAL LOW (ref 3.5–5.1)
Sodium: 142 mmol/L (ref 135–145)

## 2017-09-23 LAB — POC URINE PREG, ED: Preg Test, Ur: NEGATIVE

## 2017-09-23 NOTE — ED Notes (Signed)
Urine POC negative. 

## 2017-09-23 NOTE — Discharge Instructions (Addendum)
Please seek medical attention for any high fevers, chest pain, shortness of breath, change in behavior, persistent vomiting, bloody stool or any other new or concerning symptoms.  

## 2017-09-23 NOTE — ED Provider Notes (Signed)
[facility] Emergency Department Provider Note  ____________________________________________   I have reviewed the triage vital signs and the nursing notes.   HISTORY  Chief Complaint Near Syncope   History limited by: Not Limited   HPI Diamond Sutton is a 32 y.o. female who presents to the emergency department today because of a concern for a near syncopal episode.  This happened yesterday.  Patient states she started feeling dizzy and then felt like she was going to pass out.  She denied any chest pain or heart palpitations at that time.  She states she is continued to feel off today.  She does have occasional changes in her vision when she stands up.  Patient states she recently was started on a new antibiotic for bacterial infection.   Per medical record review patient has a history of ed visit roughly 1 month ago for nausea vomiting and diarrhea  Past Medical History:  Diagnosis Date  . Depression 2001  . Ectopic pregnancy   . Hypertension   . Ovarian cyst     Patient Active Problem List   Diagnosis Date Noted  . Labor and delivery, indication for care 12/11/2016  . Pregnancy 12/06/2016  . Indication for care in labor or delivery 12/06/2016  . Gestational diabetes 11/20/2016  . Itching 11/17/2016  . Back pain affecting pregnancy in third trimester 10/05/2016  . 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 DILATION AND CURETTAGE OF UTERUS     SAB    Prior to Admission medications   Medication Sig Start Date End Date Taking? Authorizing Provider  benzonatate (TESSALON PERLES) 100 MG capsule Take 1-2 tabs TID prn cough Patient not taking: Reported on 08/20/2017 06/17/17   Menshew, Charlesetta Ivory, PA-C  cetirizine-pseudoephedrine (ZYRTEC-D) 5-120 MG tablet Take 1 tablet by mouth 2 (two) times daily. Patient not taking: Reported on 08/20/2017 06/17/17   Menshew, Charlesetta Ivory, PA-C  clindamycin  (CLEOCIN) 150 MG capsule Take 2 capsules (300 mg total) by mouth 3 (three) times daily. Patient not taking: Reported on 08/20/2017 04/15/17   Faythe Ghee, PA-C  fluticasone [facility]) 50 MCG/ACT nasal spray Place 2 sprays into both nostrils daily. Patient not taking: Reported on 08/20/2017 06/17/17   Menshew, Charlesetta Ivory, PA-C  glucose blood test strip Use as directed up to four times a day 12/24/16   Gunnar Bulla, CNM  ibuprofen (ADVIL,MOTRIN) 800 MG tablet Take 1 tablet (800 mg total) by mouth every 8 (eight) hours as needed. Patient not taking: Reported on 08/20/2017 04/15/17   Faythe Ghee, PA-C  ondansetron (ZOFRAN ODT) 4 MG disintegrating tablet Take 1 tablet (4 mg total) by mouth every 6 (six) hours as needed for nausea or vomiting. 08/20/17   Sharyn Creamer, MD  traMADol (ULTRAM) 50 MG tablet Take 1 tablet (50 mg total) by mouth every 6 (six) hours as needed. Patient not taking: Reported on 08/20/2017 04/15/17   Faythe Ghee, PA-C    Allergies Penicillins  Family History  Problem Relation Age of Onset  . Diabetes Mother   . Breast cancer Neg Hx   . Ovarian cancer Neg Hx   . Colon cancer Neg Hx   . Heart disease Neg Hx     Social History Social History   Tobacco Use  . Smoking status: Current Every Day Smoker    Packs/day: 0.25    Years: 15.00    Pack years: 3.75  Types: Cigarettes    Last attempt to quit: 05/08/2016    Years since quitting: 1.3  . Smokeless tobacco: Never Used  Substance Use Topics  . Alcohol use: No    Comment: drinks on weekends  . Drug use: No    Review of Systems Constitutional: No fever/chills Eyes: No visual changes. ENT: No sore throat. Cardiovascular: Denies chest pain. Respiratory: Denies shortness of breath. Gastrointestinal: No abdominal pain.  No nausea, no vomiting.  No diarrhea.   Genitourinary: Negative for dysuria. Musculoskeletal: Negative for back pain. Skin: Negative for rash. Neurological: Positive for  lightheadedness. ____________________________________________   PHYSICAL EXAM:  VITAL SIGNS: ED Triage Vitals  Enc Vitals Group     BP 09/23/17 1136 (!) 163/93     Pulse Rate 09/23/17 1136 76     Resp 09/23/17 1136 16     Temp 09/23/17 1136 98.8 F (37.1 C)     Temp Source 09/23/17 1136 Oral     SpO2 09/23/17 1136 99 %     Weight 09/23/17 1137 147 lb (66.7 kg)     Height 09/23/17 1137 5\' 6"  (1.676 m)     Head Circumference --      Peak Flow --      Pain Score 09/23/17 1137 0     Pain Loc --      Pain Edu? --      Excl. in GC? --      Constitutional: Alert and oriented.  Eyes: Conjunctivae are normal.  ENT      Head: Normocephalic and atraumatic.      Nose: No congestion/rhinnorhea.      Mouth/Throat: Mucous membranes are moist.      Neck: No stridor. Hematological/Lymphatic/Immunilogical: No cervical lymphadenopathy. Cardiovascular: Normal rate, regular rhythm.  No murmurs, rubs, or gallops.  Respiratory: Normal respiratory effort without tachypnea nor retractions. Breath sounds are clear and equal bilaterally. No wheezes/rales/rhonchi. Gastrointestinal: Soft and non tender. No rebound. No guarding.  Genitourinary: Deferred Musculoskeletal: Normal range of motion in all extremities. No lower extremity edema. Neurologic:  Normal speech and language. No gross focal neurologic deficits are appreciated.  Skin:  Skin is warm, dry and intact. No rash noted. Psychiatric: Mood and affect are normal. Speech and behavior are normal. Patient exhibits appropriate insight and judgment.  ____________________________________________    LABS (pertinent positives/negatives)  BMP wnl except k 3.4 CBC wnl UA unremarkable Upreg negative ____________________________________________   EKG  I, Phineas Semen, attending physician, personally viewed and interpreted this EKG  EKG Time: 1155 Rate: 70 Rhythm: normal sinus rhythm Axis: normal Intervals: qtc 406 QRS: narrow ST  changes: no st elevation Impression: normal ekg  ____________________________________________    RADIOLOGY  None  ____________________________________________   PROCEDURES  Procedures  ____________________________________________   INITIAL IMPRESSION / ASSESSMENT AND PLAN / ED COURSE  Pertinent labs & imaging results that were available during my care of the patient were reviewed by me and considered in my medical decision making (see chart for details).   Patient presented to the emergency department today because of concerns for a near syncopal episode that happened yesterday.  Blood work and EKG unremarkable here.  At this point I doubt electrolyte abnormality, ACS, PE.  Do wonder if it could be related to medication patient is started.  Discussed this possibility with patient.  Discussed importance of primary care follow-up.  ____________________________________________   FINAL CLINICAL IMPRESSION(S) / ED DIAGNOSES  Final diagnoses:  Near syncope     Note: This dictation was  prepared with [employer]. Any transcriptional errors that result from this process are unintentional     Phineas Semen, MD 09/23/17 1455

## 2017-09-23 NOTE — ED Triage Notes (Signed)
Pt in via POV, reports noticing swelling to bilateral feet yesterday while at work, also reports near syncopal episode while at work yesterday, states, "since then I have been seeing black spots."  Pt ambulatory to triage, vitals WDL, NAD noted at this time.

## 2017-10-21 ENCOUNTER — Encounter: Payer: Self-pay | Admitting: Emergency Medicine

## 2017-10-21 ENCOUNTER — Emergency Department: Payer: Self-pay

## 2017-10-21 ENCOUNTER — Emergency Department
Admission: EM | Admit: 2017-10-21 | Discharge: 2017-10-21 | Disposition: A | Discharge status: Home / Self Care | Payer: Self-pay | Attending: Emergency Medicine | Admitting: Emergency Medicine

## 2017-10-21 ENCOUNTER — Other Ambulatory Visit: Payer: Self-pay

## 2017-10-21 DIAGNOSIS — Y999 Unspecified external cause status: Secondary | ICD-10-CM | POA: Insufficient documentation

## 2017-10-21 DIAGNOSIS — Y929 Unspecified place or not applicable: Secondary | ICD-10-CM | POA: Insufficient documentation

## 2017-10-21 DIAGNOSIS — Y9389 Activity, other specified: Secondary | ICD-10-CM | POA: Insufficient documentation

## 2017-10-21 DIAGNOSIS — S0990XA Unspecified injury of head, initial encounter: Secondary | ICD-10-CM | POA: Insufficient documentation

## 2017-10-21 DIAGNOSIS — F17210 Nicotine dependence, cigarettes, uncomplicated: Secondary | ICD-10-CM | POA: Insufficient documentation

## 2017-10-21 DIAGNOSIS — I10 Essential (primary) hypertension: Secondary | ICD-10-CM | POA: Insufficient documentation

## 2017-10-21 MED ORDER — HYDROCODONE-ACETAMINOPHEN 5-325 MG PO TABS: 1.0000 | ORAL_TABLET | ORAL | 0 refills | Status: DC | PRN
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
## 2018-05-25 LAB — HM HIV SCREENING LAB: HM HIV Screening: NEGATIVE

## 2018-08-17 ENCOUNTER — Emergency Department
Admission: EM | Admit: 2018-08-17 | Discharge: 2018-08-18 | Disposition: A | Discharge status: Home / Self Care | Payer: Self-pay | Attending: Emergency Medicine | Admitting: Emergency Medicine

## 2018-08-17 ENCOUNTER — Other Ambulatory Visit: Payer: Self-pay

## 2018-08-17 DIAGNOSIS — Z8632 Personal history of gestational diabetes: Secondary | ICD-10-CM | POA: Insufficient documentation

## 2018-08-17 DIAGNOSIS — O2310 Infections of bladder in pregnancy, unspecified trimester: Secondary | ICD-10-CM | POA: Insufficient documentation

## 2018-08-17 DIAGNOSIS — Z79899 Other long term (current) drug therapy: Secondary | ICD-10-CM | POA: Insufficient documentation

## 2018-08-17 DIAGNOSIS — R55 Syncope and collapse: Secondary | ICD-10-CM | POA: Insufficient documentation

## 2018-08-17 DIAGNOSIS — Z3A00 Weeks of gestation of pregnancy not specified: Secondary | ICD-10-CM | POA: Insufficient documentation

## 2018-08-17 DIAGNOSIS — N390 Urinary tract infection, site not specified: Secondary | ICD-10-CM

## 2018-08-17 DIAGNOSIS — F17210 Nicotine dependence, cigarettes, uncomplicated: Secondary | ICD-10-CM | POA: Insufficient documentation

## 2018-08-17 DIAGNOSIS — O99280 Endocrine, nutritional and metabolic diseases complicating pregnancy, unspecified trimester: Secondary | ICD-10-CM | POA: Insufficient documentation

## 2018-08-17 DIAGNOSIS — E876 Hypokalemia: Secondary | ICD-10-CM | POA: Insufficient documentation

## 2018-08-17 DIAGNOSIS — I10 Essential (primary) hypertension: Secondary | ICD-10-CM | POA: Insufficient documentation

## 2018-08-17 DIAGNOSIS — O99330 Smoking (tobacco) complicating pregnancy, unspecified trimester: Secondary | ICD-10-CM | POA: Insufficient documentation

## 2018-08-17 DIAGNOSIS — F1490 Cocaine use, unspecified, uncomplicated: Secondary | ICD-10-CM | POA: Insufficient documentation

## 2018-08-17 DIAGNOSIS — O9989 Other specified diseases and conditions complicating pregnancy, childbirth and the puerperium: Secondary | ICD-10-CM | POA: Insufficient documentation

## 2018-08-17 DIAGNOSIS — F10920 Alcohol use, unspecified with intoxication, uncomplicated: Secondary | ICD-10-CM | POA: Insufficient documentation

## 2018-08-17 DIAGNOSIS — O99320 Drug use complicating pregnancy, unspecified trimester: Secondary | ICD-10-CM | POA: Insufficient documentation

## 2018-08-17 DIAGNOSIS — O99310 Alcohol use complicating pregnancy, unspecified trimester: Secondary | ICD-10-CM | POA: Insufficient documentation

## 2018-08-17 NOTE — ED Triage Notes (Signed)
Pt arrives via [facility] from home after passing out. EMS reports pt is pregnant but is unsure how far along she is. PT reports that she has drank 2 bottles of liquor today but "not the big bottles". Pt nauseous but has not vomited yet. Pt states her brother committed suicide last night and she has been planning his funeral and this has been "too much to handle". Pt in NAD and is arousable. A&Ox4

## 2018-08-18 ENCOUNTER — Ambulatory Visit: Payer: Self-pay

## 2018-08-18 LAB — CBC WITH DIFFERENTIAL/PLATELET
Abs Immature Granulocytes: 0.04 10*3/uL (ref 0.00–0.07)
Basophils Absolute: 0 10*3/uL (ref 0.0–0.1)
Basophils Relative: 0 %
Eosinophils Absolute: 0 10*3/uL (ref 0.0–0.5)
Eosinophils Relative: 0 %
HCT: 38.8 % (ref 36.0–46.0)
Hemoglobin: 12.9 g/dL (ref 12.0–15.0)
Immature Granulocytes: 0 %
Lymphocytes Relative: 20 %
Lymphs Abs: 2.3 10*3/uL (ref 0.7–4.0)
MCH: 30 pg (ref 26.0–34.0)
MCHC: 33.2 g/dL (ref 30.0–36.0)
MCV: 90.2 fL (ref 80.0–100.0)
Monocytes Absolute: 1 10*3/uL (ref 0.1–1.0)
Monocytes Relative: 8 %
Neutro Abs: 8.2 10*3/uL — ABNORMAL HIGH (ref 1.7–7.7)
Neutrophils Relative %: 72 %
Platelets: 297 10*3/uL (ref 150–400)
RBC: 4.3 MIL/uL (ref 3.87–5.11)
RDW: 13.2 % (ref 11.5–15.5)
WBC: 11.6 10*3/uL — ABNORMAL HIGH (ref 4.0–10.5)
nRBC: 0 % (ref 0.0–0.2)

## 2018-08-18 LAB — URINE DRUG SCREEN, QUALITATIVE (ARMC ONLY)
Amphetamines, Ur Screen: NOT DETECTED
Barbiturates, Ur Screen: NOT DETECTED
Benzodiazepine, Ur Scrn: NOT DETECTED
Cannabinoid 50 Ng, Ur ~~LOC~~: NOT DETECTED
Cocaine Metabolite,Ur ~~LOC~~: POSITIVE — AB
MDMA (Ecstasy)Ur Screen: NOT DETECTED
Methadone Scn, Ur: NOT DETECTED
Opiate, Ur Screen: NOT DETECTED
Phencyclidine (PCP) Ur S: NOT DETECTED
Tricyclic, Ur Screen: NOT DETECTED

## 2018-08-18 LAB — COMPREHENSIVE METABOLIC PANEL
ALT: 17 U/L (ref 0–44)
AST: 17 U/L (ref 15–41)
Albumin: 4.6 g/dL (ref 3.5–5.0)
Alkaline Phosphatase: 60 U/L (ref 38–126)
Anion gap: 11 (ref 5–15)
BUN: 7 mg/dL (ref 6–20)
CO2: 22 mmol/L (ref 22–32)
Calcium: 8.4 mg/dL — ABNORMAL LOW (ref 8.9–10.3)
Chloride: 107 mmol/L (ref 98–111)
Creatinine, Ser: 0.52 mg/dL (ref 0.44–1.00)
GFR calc Af Amer: >60 mL/min (ref >60)
GFR calc non Af Amer: >60 mL/min (ref >60)
Glucose, Bld: 106 mg/dL — ABNORMAL HIGH (ref 70–99)
Potassium: 2.6 mmol/L — CL (ref 3.5–5.1)
Sodium: 140 mmol/L (ref 135–145)
Total Bilirubin: 0.5 mg/dL (ref 0.3–1.2)
Total Protein: 7.8 g/dL (ref 6.5–8.1)

## 2018-08-18 LAB — URINALYSIS, COMPLETE (UACMP) WITH MICROSCOPIC
Bilirubin Urine: NEGATIVE
Glucose, UA: NEGATIVE mg/dL
Ketones, ur: NEGATIVE mg/dL
Nitrite: NEGATIVE
Protein, ur: NEGATIVE mg/dL
Specific Gravity, Urine: 1.004 — ABNORMAL LOW (ref 1.005–1.030)
pH: 6 (ref 5.0–8.0)

## 2018-08-18 LAB — HCG, QUANTITATIVE, PREGNANCY: hCG, Beta Chain, Quant, S: <1 m[IU]/mL (ref <5)

## 2018-08-18 LAB — ETHANOL: Alcohol, Ethyl (B): 154 mg/dL — ABNORMAL HIGH (ref <10)

## 2018-08-18 LAB — LIPASE, BLOOD: Lipase: 26 U/L (ref 11–51)

## 2018-08-18 MED ORDER — POTASSIUM CHLORIDE CRYS ER 20 MEQ PO TBCR
60.0000 meq | EXTENDED_RELEASE_TABLET | Freq: Once | ORAL | Status: AC
  Administered 2018-08-18: 02:00:00 60 meq via ORAL
  Filled 2018-08-18: qty 3

## 2018-08-18 MED ORDER — SODIUM CHLORIDE 0.9 % IV BOLUS
1000.0000 mL | Freq: Once | INTRAVENOUS | Status: AC
  Administered 2018-08-18: 1000 mL via INTRAVENOUS

## 2018-08-18 NOTE — ED Notes (Signed)
Pt given [place] sandwich tray, peanut butter, crackers and gingerale

## 2018-08-18 NOTE — ED Provider Notes (Signed)
[facility] Emergency Department Provider Note   ____________________________________________   First MD Initiated Contact with Patient 08/18/18 0010     (approximate)  I have reviewed the triage vital signs and the nursing notes.   HISTORY  Chief Complaint Loss of Consciousness    HPI Diamond Sutton is a 33 y.o. female brought to the ED from home via EMS s/p syncope. Patient is pregnant, unknown how many weeks, LMP in May who was planning her brother's funeral (who died yesterday) when she became upset and passed out. Admits to EtOH and Ectasy use. She is [id number]. Denies fever, cough, chest pain, shortness of breath, abdominal pain, vaginal bleeding, nausea, vomiting. Denies active SI/HI/AH/VH. Denies travel, trauma or exposure to persons diagnosed with coronavirus.       Past Medical History:  Diagnosis Date   Depression 2001   Ectopic pregnancy    Hypertension    Ovarian cyst     Patient Active Problem List   Diagnosis Date Noted   Gestational diabetes 11/20/2016   Itching 11/17/2016   Back pain affecting pregnancy in third trimester 10/05/2016   [place] multiparity 11/23/2014    Past Surgical History:  Procedure Laterality Date   DILATION AND CURETTAGE OF UTERUS     SAB    Prior to Admission medications   Medication Sig Start Date End Date Taking? Authorizing Provider  benzonatate (TESSALON PERLES) 100 MG capsule Take 1-2 tabs TID prn cough Patient not taking: Reported on 08/20/2017 06/17/17   Menshew, Charlesetta IvoryJenise V Bacon, PA-C  cetirizine-pseudoephedrine (ZYRTEC-D) 5-120 MG tablet Take 1 tablet by mouth 2 (two) times daily. Patient not taking: Reported on 08/20/2017 06/17/17   Menshew, Charlesetta IvoryJenise V Bacon, PA-C  clindamycin (CLEOCIN) 150 MG capsule Take 2 capsules (300 mg total) by mouth 3 (three) times daily. Patient not taking: Reported on 08/20/2017 04/15/17   Faythe GheeFisher, Susan W, PA-C  fluticasone [facility](FLONASE) 50 MCG/ACT nasal spray Place 2  sprays into both nostrils daily. Patient not taking: Reported on 08/20/2017 06/17/17   Menshew, Charlesetta IvoryJenise V Bacon, PA-C  glucose blood test strip Use as directed up to four times a day 12/24/16   Gunnar BullaLawhorn, Jenkins Michelle, CNM  HYDROcodone-acetaminophen (NORCO/VICODIN) 5-325 MG tablet Take 1 tablet by mouth every 4 (four) hours as needed. 10/21/17   Minna AntisPaduchowski, Kevin, MD  ibuprofen (ADVIL,MOTRIN) 800 MG tablet Take 1 tablet (800 mg total) by mouth every 8 (eight) hours as needed. Patient not taking: Reported on 08/20/2017 04/15/17   Faythe GheeFisher, Susan W, PA-C  norethindrone (MICRONOR) 0.35 MG tablet Take 1 tablet by mouth daily. 05/05/18   Sciora, Austin MilesElizabeth A, CNM  ondansetron (ZOFRAN ODT) 4 MG disintegrating tablet Take 1 tablet (4 mg total) by mouth every 6 (six) hours as needed for nausea or vomiting. 08/20/17   Sharyn CreamerQuale, Mark, MD  traMADol (ULTRAM) 50 MG tablet Take 1 tablet (50 mg total) by mouth every 6 (six) hours as needed. Patient not taking: Reported on 08/20/2017 04/15/17   Faythe GheeFisher, Susan W, PA-C    Allergies Penicillins  Family History  Problem Relation Age of Onset   Diabetes Mother    Hypertension Mother    Heart murmur Half-Brother    Breast cancer Neg Hx    Ovarian cancer Neg Hx    Colon cancer Neg Hx    Heart disease Neg Hx     Social History Social History   Tobacco Use   Smoking status: Current Every Day Smoker    Packs/day: 0.25  Years: 15.00    Pack years: 3.75    Types: Cigarettes    Last attempt to quit: 05/08/2016    Years since quitting: 2.2   Smokeless tobacco: Never Used  Substance Use Topics   Alcohol use: No    Comment: drinks on weekends   Drug use: No    Review of Systems  Constitutional: Positive for EtOH and drug use.  No fever/chills Eyes: No visual changes. ENT: No sore throat. Cardiovascular: Denies chest pain. Respiratory: Denies shortness of breath. Gastrointestinal: No abdominal pain.  No nausea, no vomiting.  No diarrhea.  No  constipation. Genitourinary: Negative for dysuria. Musculoskeletal: Negative for back pain. Skin: Negative for rash. Neurological: Positive for syncope.  Negative for headaches, focal weakness or numbness.   ____________________________________________   PHYSICAL EXAM:  VITAL SIGNS: ED Triage Vitals  Enc Vitals Group     BP 08/17/18 2349 (!) 149/108     Pulse Rate 08/17/18 2349 (!) 111     Resp 08/17/18 2349 18     Temp 08/17/18 2349 98.3 F (36.8 C)     Temp Source 08/17/18 2349 Axillary     SpO2 08/17/18 2349 96 %     Weight --      Height --      Head Circumference --      Peak Flow --      Pain Score 08/17/18 2350 0     Pain Loc --      Pain Edu? --      Excl. in [place]? --     Constitutional: Alert and oriented.  Tearful appearing and in no acute distress. Eyes: Conjunctivae are normal. PERRL. EOMI. Head: Atraumatic. Nose: Atraumatic. Mouth/Throat: Mucous membranes are moist.  No dental malocclusion. Neck: No stridor.  No cervical spine tenderness to palpation. Cardiovascular: Normal rate, regular rhythm. Grossly normal heart sounds.  Good peripheral circulation. Respiratory: Normal respiratory effort.  No retractions. Lungs CTAB. Gastrointestinal: Soft and nontender to light or deep palpation. No distention. No abdominal bruits. No CVA tenderness. Musculoskeletal: No lower extremity tenderness nor edema.  No joint effusions. Neurologic: Alert and oriented x3.  CN II-XII grossly intact.  Normal speech and language. No gross focal neurologic deficits are appreciated. MAEx4. Skin:  Skin is warm, dry and intact. No rash noted. Psychiatric: Mood and affect are normal. Speech and behavior are normal.  ____________________________________________   LABS (all labs ordered are listed, but only abnormal results are displayed)  Labs Reviewed  CBC WITH DIFFERENTIAL/PLATELET - Abnormal; Notable for the following components:      Result Value   WBC 11.6 (*)    Neutro Abs  8.2 (*)    All other components within normal limits  COMPREHENSIVE METABOLIC PANEL - Abnormal; Notable for the following components:   Potassium 2.6 (*)    Glucose, Bld 106 (*)    Calcium 8.4 (*)    All other components within normal limits  ETHANOL - Abnormal; Notable for the following components:   Alcohol, Ethyl (B) 154 (*)    All other components within normal limits  URINALYSIS, COMPLETE (UACMP) WITH MICROSCOPIC - Abnormal; Notable for the following components:   Color, Urine YELLOW (*)    APPearance CLOUDY (*)    Specific Gravity, Urine 1.004 (*)    Hgb urine dipstick SMALL (*)    Leukocytes,Ua TRACE (*)    Bacteria, UA RARE (*)    All other components within normal limits  URINE DRUG SCREEN, QUALITATIVE ([facility] ONLY) - Abnormal;  Notable for the following components:   Cocaine Metabolite,Ur  POSITIVE (*)    All other components within normal limits  LIPASE, BLOOD  HCG, QUANTITATIVE, PREGNANCY   ____________________________________________  EKG  ED ECG REPORT I, Schneider Warchol J, the attending physician, personally viewed and interpreted this ECG.   Date: 08/18/2018  EKG Time: 2347  Rate: 126  Rhythm: sinus tachycardia  Axis: Normal  Intervals:none  ST&T Change: Nonspecific  ____________________________________________  RADIOLOGY  ED MD interpretation:  None  Official radiology report(s): No results found.  ____________________________________________   PROCEDURES  Procedure(s) performed (including Critical Care):  Procedures   ____________________________________________   INITIAL IMPRESSION / ASSESSMENT AND PLAN / ED COURSE  As part of my medical decision making, I reviewed the following data within the electronic MEDICAL RECORD NUMBER Nursing notes reviewed and incorporated, Labs reviewed, EKG interpreted, Old chart reviewed and Notes from prior ED visits     Diamond Malletasha D Kwasnik was evaluated in Emergency Department on 08/18/2018 for the symptoms  described in the history of present illness. She was evaluated in the context of the global COVID-19 pandemic, which necessitated consideration that the patient might be at risk for infection with the SARS-CoV-2 virus that causes COVID-19. Institutional protocols and algorithms that pertain to the evaluation of patients at risk for COVID-19 are in a state of rapid change based on information released by regulatory bodies including the CDC and federal and state organizations. These policies and algorithms were followed during the patient's care in the ED.   33 year old female probable first trimester pregnancy who passed out after EtOH and ecstasy use in the setting of recent death of her brother.  Voices no active suicidal ideation.  Differential diagnosis includes but is not limited to grief reaction, EtOH, substance use, dehydration, neurological, cardiac etiologies, etc.  Patient is neurologically intact without focal deficits.  She is understandably tearful.  Will obtain basic lab work, initiate IV fluid resuscitation and reassess.  Clinical Course as of Aug 17 224  Thu Aug 18, 2018  0223 Updated patient of all test results. Fosfomycin given prior to discharge.  Strict return precautions given.  Patient verbalizes understanding agrees with plan of care.   [JS]    Clinical Course User Index [JS] Irean HongSung, Dayleen Beske J, MD     ____________________________________________   FINAL CLINICAL IMPRESSION(S) / ED DIAGNOSES  Final diagnoses:  Syncope, unspecified syncope type  Alcoholic intoxication without complication (HCC)  Cocaine use  Urinary tract infection without hematuria, site unspecified  Hypokalemia     ED Discharge Orders    None       Note:  This document was prepared using Dragon voice recognition software and may include unintentional dictation errors.   Irean HongSung, Alisi Lupien J, MD 08/18/18 [phone]

## 2018-08-18 NOTE — ED Notes (Signed)
Lavender, light green and red topped tubes sent to lab along with urine sample

## 2018-08-18 NOTE — Discharge Instructions (Addendum)
You have been treated for UTI.  Eat foods that are rich in potassium such as orange juice, green leafy vegetables and bananas.  Return to the ER for worsening symptoms, persistent vomiting, difficulty breathing or other concerns.

## 2018-08-18 NOTE — ED Notes (Signed)
Dr. Beather Arbour informed of critical potassium

## 2018-08-18 NOTE — ED Notes (Signed)
Pt's sister at bedside.

## 2018-08-25 ENCOUNTER — Other Ambulatory Visit: Payer: Self-pay

## 2018-08-25 ENCOUNTER — Ambulatory Visit: Payer: Self-pay | Admitting: Physician Assistant

## 2018-08-25 DIAGNOSIS — Z113 Encounter for screening for infections with a predominantly sexual mode of transmission: Secondary | ICD-10-CM

## 2018-08-25 DIAGNOSIS — N760 Acute vaginitis: Secondary | ICD-10-CM

## 2018-08-25 DIAGNOSIS — B9689 Other specified bacterial agents as the cause of diseases classified elsewhere: Secondary | ICD-10-CM

## 2018-08-25 LAB — WET PREP FOR TRICH, YEAST, CLUE
Trichomonas Exam: NEGATIVE
Yeast Exam: NEGATIVE

## 2018-08-26 ENCOUNTER — Encounter: Payer: Self-pay | Admitting: Physician Assistant

## 2018-08-26 MED ORDER — METRONIDAZOLE 0.75 % VA GEL: 1.0000 | Freq: Every day | VAGINAL | 0 refills | Status: DC

## 2018-08-26 NOTE — Progress Notes (Signed)
STI clinic/screening visit  Subjective:  Diamond Sutton is a 33 y.o. female being seen today for an STI screening visit. The patient reports they do have symptoms.  Patient has the following medical conditions:   Patient Active Problem List   Diagnosis Date Noted  . Gestational diabetes 11/20/2016  . Itching 11/17/2016  . Back pain affecting pregnancy in third trimester 10/05/2016  . [place] multiparity 11/23/2014     Chief Complaint  Patient presents with  . SEXUALLY TRANSMITTED DISEASE    HPI  Patient reports that she is having a "foul" odor and itching off and on for 2 weeks.  Also, reports some cramping off and on.  Denies other symptoms and concerns today.  LMP 07/28/2018 and normal.  States that she was seen in the ER last week with UTI symptoms but not dx with UTI.  States that she was told to drink cranberry juice.  Reports that she usually drinks a lot of soda.   See flowsheet for further details and programmatic requirements.    The following portions of the patient's history were reviewed and updated as appropriate: allergies, current medications, past medical history, past social history, past surgical history and problem list.  Objective:  There were no vitals filed for this visit.  Physical Exam Constitutional:      General: She is not in acute distress.    Appearance: Normal appearance.  HENT:     Head: Normocephalic and atraumatic.     Mouth/Throat:     Mouth: Mucous membranes are moist.     Pharynx: Oropharynx is clear. No oropharyngeal exudate or posterior oropharyngeal erythema.  Neck:     Musculoskeletal: Neck supple.  Pulmonary:     Effort: Pulmonary effort is normal.  Abdominal:     Palpations: Abdomen is soft. There is no mass.     Tenderness: There is no abdominal tenderness. There is no guarding or rebound.  Genitourinary:    General: Normal vulva.     Rectum: Normal.     Comments: External genitalia/pubic area without nits, lice, edema,  erythema, lesions and inguinal adenopathy.  Vagina with normal mucosa, small amount of thin, whitish, adherent discharge, pH= >4.5. Cervix without visible lesions. Uterus normal size, firm, mobile, nt, no masses, no CMT, no adnexal tenderness or fullness.  Lymphadenopathy:     Cervical: No cervical adenopathy.  Skin:    General: Skin is warm and dry.     Findings: No bruising, erythema, lesion or rash.  Neurological:     Mental Status: She is alert and oriented to person, place, and time.  Psychiatric:        Mood and Affect: Mood normal.        Behavior: Behavior normal.        Thought Content: Thought content normal.        Judgment: Judgment normal.       Assessment and Plan:  Diamond Sutton is a 33 y.o. female presenting to the [facility] Department for STI screening  1. Screening for STD (sexually transmitted disease) Patient with concern for vaginal odor today. Rec condoms with all sex Await test results.  Counseled that RN will call if needs to RTC for further treatment after results are back. - WET PREP FOR [place], YEAST, [place]  2. BV (bacterial vaginosis) Patient declines po Metronidazole for treatment of BV stating that the pills make her sick. Will treat with vandazole 0.75% vaginal gel  1 app qhs for 5-7 days No sex for 10 days Rec use OTC antifungal cream after completing gel if having symptoms. - metroNIDAZOLE (VANDAZOLE) 0.75 % vaginal gel; Place 1 Applicatorful vaginally at bedtime.  Dispense: 70 g; Refill: 0     No follow-ups on file.  No future appointments.  Matt Holmesarla J Gorje Iyer, PA

## 2018-09-12 ENCOUNTER — Telehealth: Payer: Self-pay | Admitting: Family Medicine

## 2018-09-14 NOTE — Telephone Encounter (Signed)
CALLING IN REGARDS TO TR

## 2018-09-14 NOTE — Telephone Encounter (Signed)
Consulted by RN re:  Patient concern.  Agree with advice as documented by RN that this is what we discussed and agreed to advise patient.

## 2018-09-14 NOTE — Telephone Encounter (Signed)
TC to patient who is complaining of increased irritation after using Vandazole. Per consult with  Criss Rosales, patient encouraged to try using OTC yeast cream and continue to refrain from having sex during treatment. Patient agreed to try yeast cream, and will call back for appointment if that does not help symptoms.Jenetta Downer, RN

## 2018-10-03 ENCOUNTER — Other Ambulatory Visit: Payer: Self-pay

## 2018-10-03 ENCOUNTER — Ambulatory Visit (LOCAL_COMMUNITY_HEALTH_CENTER): Payer: Self-pay

## 2018-10-03 VITALS — BP 127/83 | Ht 66.0 in | Wt 151.5 lb

## 2018-10-03 DIAGNOSIS — Z3201 Encounter for pregnancy test, result positive: Secondary | ICD-10-CM

## 2018-10-03 LAB — PREGNANCY, URINE: Preg Test, Ur: POSITIVE — AB

## 2018-10-03 MED ORDER — PRENATAL VITAMIN 27-0.8 MG PO TABS: 1.0000 | ORAL_TABLET | Freq: Every day | ORAL | 0 refills | Status: DC

## 2018-10-31 ENCOUNTER — Other Ambulatory Visit: Payer: Self-pay

## 2018-10-31 ENCOUNTER — Ambulatory Visit (INDEPENDENT_AMBULATORY_CARE_PROVIDER_SITE_OTHER): Payer: Medicaid Other | Admitting: Certified Nurse Midwife

## 2018-10-31 VITALS — BP 131/83 | HR 82 | Ht 66.0 in | Wt 159.1 lb

## 2018-10-31 DIAGNOSIS — Z3481 Encounter for supervision of other normal pregnancy, first trimester: Secondary | ICD-10-CM | POA: Diagnosis not present

## 2018-10-31 DIAGNOSIS — Z113 Encounter for screening for infections with a predominantly sexual mode of transmission: Secondary | ICD-10-CM

## 2018-10-31 DIAGNOSIS — Z0283 Encounter for blood-alcohol and blood-drug test: Secondary | ICD-10-CM

## 2018-10-31 LAB — OB RESULTS CONSOLE VARICELLA ZOSTER ANTIBODY, IGG: Varicella: IMMUNE

## 2018-10-31 LAB — OB RESULTS CONSOLE GC/CHLAMYDIA: Gonorrhea: NEGATIVE

## 2018-10-31 MED ORDER — ONDANSETRON HCL 4 MG PO TABS: 4.0000 mg | ORAL_TABLET | Freq: Three times a day (TID) | ORAL | 2 refills | Status: DC | PRN

## 2018-10-31 MED ORDER — BONJESTA 20-20 MG PO TBCR: 20.0000 mg | EXTENDED_RELEASE_TABLET | ORAL | 1 refills | Status: DC

## 2018-10-31 NOTE — Patient Instructions (Incomplete)
WHAT OB PATIENTS CAN EXPECT   Confirmation of pregnancy and ultrasound ordered if medically indicated-[redacted] weeks gestation  New OB (NOB) intake with nurse and New OB (NOB) labs- [redacted] weeks gestation  New OB (NOB) physical examination with provider- 11/[redacted] weeks gestation  Flu vaccine-[redacted] weeks gestation  Anatomy scan-[redacted] weeks gestation  Glucose tolerance test, blood work to test for anemia, T-dap vaccine-[redacted] weeks gestation  Vaginal swabs/cultures-STD/Group B strep-[redacted] weeks gestation  Appointments every 4 weeks until 28 weeks  Every 2 weeks from 28 weeks until 36 weeks  Weekly visits from 36 weeks until delivery  How a Baby Grows During Pregnancy  Pregnancy begins when a female's sperm enters a female's egg (fertilization). Fertilization usually happens in one of the tubes (fallopian tubes) that connect the ovaries to the womb (uterus). The fertilized egg moves down the fallopian tube to the uterus. Once it reaches the uterus, it implants into the lining of the uterus and begins to grow. For the first 10 weeks, the fertilized egg is called an embryo. After 10 weeks, it is called a fetus. As the fetus continues to grow, it receives oxygen and nutrients through tissue (placenta) that grows to support the developing baby. The placenta is the life support system for the baby. It provides oxygen and nutrition and removes waste. Learning as much as you can about your pregnancy and how your baby is developing can help you enjoy the experience. It can also make you aware of when there might be a problem and when to ask questions. How long does a typical pregnancy last? A pregnancy usually lasts 280 days, or about 40 weeks. Pregnancy is divided into three periods of growth, also called trimesters:  First trimester: 0-12 weeks.  Second trimester: 13-27 weeks.  Third trimester: 28-40 weeks. The day when your baby is ready to be born (full term) is your estimated date of delivery. How does my baby  develop month by month? First month  The fertilized egg attaches to the inside of the uterus.  Some cells will form the placenta. Others will form the fetus.  The arms, legs, brain, spinal cord, lungs, and heart begin to develop.  At the end of the first month, the heart begins to beat. Second month  The bones, inner ear, eyelids, hands, and feet form.  The genitals develop.  By the end of 8 weeks, all major organs are developing. Third month  All of the internal organs are forming.  Teeth develop below the gums.  Bones and muscles begin to grow. The spine can flex.  The skin is transparent.  Fingernails and toenails begin to form.  Arms and legs continue to grow longer, and hands and feet develop.  The fetus is about 3 inches (7.6 cm) long. Fourth month  The placenta is completely formed.  The external sex organs, neck, outer ear, eyebrows, eyelids, and fingernails are formed.  The fetus can hear, swallow, and move its arms and legs.  The kidneys begin to produce urine.  The skin is covered with a white, waxy coating (vernix) and very fine hair (lanugo). Fifth month  The fetus moves around more and can be felt for the first time (quickening).  The fetus starts to sleep and wake up and may begin to suck its finger.  The nails grow to the end of the fingers.  The organ in the digestive system that makes bile (gallbladder) functions and helps to digest nutrients.  If your baby is a girl, eggs  are present in her ovaries. If your baby is a boy, testicles start to move down into his scrotum. Sixth month  The lungs are formed.  The eyes open. The brain continues to develop.  Your baby has fingerprints and toe prints. Your baby's hair grows thicker.  At the end of the second trimester, the fetus is about 9 inches (22.9 cm) long. Seventh month  The fetus kicks and stretches.  The eyes are developed enough to sense changes in light.  The hands can make a  grasping motion.  The fetus responds to sound. Eighth month  All organs and body systems are fully developed and functioning.  Bones harden, and taste buds develop. The fetus may hiccup.  Certain areas of the brain are still developing. The skull remains soft. Ninth month  The fetus gains about  lb (0.23 kg) each week.  The lungs are fully developed.  Patterns of sleep develop.  The fetus's head typically moves into a head-down position (vertex) in the uterus to prepare for birth.  The fetus weighs 6-9 lb (2.72-4.08 kg) and is 19-20 inches (48.26-50.8 cm) long. What can I do to have a healthy pregnancy and help my baby develop? General instructions  Take prenatal vitamins as directed by your health care provider. These include vitamins such as folic acid, iron, calcium, and vitamin D. They are important for healthy development.  Take medicines only as directed by your health care provider. Read labels and ask a pharmacist or your health care provider whether over-the-counter medicines, supplements, and prescription drugs are safe to take during pregnancy.  Keep all follow-up visits as directed by your health care provider. This is important. Follow-up visits include prenatal care and screening tests. How do I know if my baby is developing well? At each prenatal visit, your health care provider will do several different tests to check on your health and keep track of your baby's development. These include:  Fundal height and position. ? Your health care provider will measure your growing belly from your pubic bone to the top of the uterus using a tape measure. ? Your health care provider will also feel your belly to determine your baby's position.  Heartbeat. ? An ultrasound in the first trimester can confirm pregnancy and show a heartbeat, depending on how far along you are. ? Your health care provider will check your baby's heart rate at every prenatal visit.  Second  trimester ultrasound. ? This ultrasound checks your baby's development. It also may show your baby's gender. What should I do if I have concerns about my baby's development? Always talk with your health care provider about any concerns that you may have about your pregnancy and your baby. Summary  A pregnancy usually lasts 280 days, or about 40 weeks. Pregnancy is divided into three periods of growth, also called trimesters.  Your health care provider will monitor your baby's growth and development throughout your pregnancy.  Follow your health care provider's recommendations about taking prenatal vitamins and medicines during your pregnancy.  Talk with your health care provider if you have any concerns about your pregnancy or your developing baby. This information is not intended to replace advice given to you by your health care provider. Make sure you discuss any questions you have with your health care provider. Document Released: 06/24/2007 Document Revised: 04/28/2018 Document Reviewed: 11/18/2016 Elsevier Patient Education  2020 [place] of Pregnancy  The first trimester of pregnancy is from week 1 until the  end of week 13 (months 1 through 3). During this time, your baby will begin to develop inside you. At 6-8 weeks, the eyes and face are formed, and the heartbeat can be seen on ultrasound. At the end of 12 weeks, all the baby's organs are formed. Prenatal care is all the medical care you receive before the birth of your baby. Make sure you get good prenatal care and follow all of your doctor's instructions. Follow these instructions at home: Medicines  Take over-the-counter and prescription medicines only as told by your doctor. Some medicines are safe and some medicines are not safe during pregnancy.  Take a prenatal vitamin that contains at least 600 micrograms (mcg) of folic acid.  If you have trouble pooping (constipation), take medicine that will make  your stool soft (stool softener) if your doctor approves. Eating and drinking   Eat regular, healthy meals.  Your doctor will tell you the amount of weight gain that is right for you.  Avoid raw meat and uncooked cheese.  If you feel sick to your stomach (nauseous) or throw up (vomit): ? Eat 4 or 5 small meals a day instead of 3 large meals. ? Try eating a few soda crackers. ? Drink liquids between meals instead of during meals.  To prevent constipation: ? Eat foods that are high in fiber, like fresh fruits and vegetables, whole grains, and beans. ? Drink enough fluids to keep your pee (urine) clear or pale yellow. Activity  Exercise only as told by your doctor. Stop exercising if you have cramps or pain in your lower belly (abdomen) or low back.  Do not exercise if it is too hot, too humid, or if you are in a place of great height (high altitude).  Try to avoid standing for long periods of time. Move your legs often if you must stand in one place for a long time.  Avoid heavy lifting.  Wear low-heeled shoes. Sit and stand up straight.  You can have sex unless your doctor tells you not to. Relieving pain and discomfort  Wear a good support bra if your breasts are sore.  Take warm water baths (sitz baths) to soothe pain or discomfort caused by hemorrhoids. Use hemorrhoid cream if your doctor says it is okay.  Rest with your legs raised if you have leg cramps or low back pain.  If you have puffy, bulging veins (varicose veins) in your legs: ? Wear support hose or compression stockings as told by your doctor. ? Raise (elevate) your feet for 15 minutes, 3-4 times a day. ? Limit salt in your food. Prenatal care  Schedule your prenatal visits by the twelfth week of pregnancy.  Write down your questions. Take them to your prenatal visits.  Keep all your prenatal visits as told by your doctor. This is important. Safety  Wear your seat belt at all times when  driving.  Make a list of emergency phone numbers. The list should include numbers for family, friends, the hospital, and police and fire departments. General instructions  Ask your doctor for a referral to a local prenatal class. Begin classes no later than at the start of month 6 of your pregnancy.  Ask for help if you need counseling or if you need help with nutrition. Your doctor can give you advice or tell you where to go for help.  Do not use hot tubs, steam rooms, or saunas.  Do not douche or use tampons or scented sanitary pads.  Do not cross your legs for long periods of time.  Avoid all herbs and alcohol. Avoid drugs that are not approved by your doctor.  Do not use any tobacco products, including cigarettes, chewing tobacco, and electronic cigarettes. If you need help quitting, ask your doctor. You may get counseling or other support to help you quit.  Avoid cat litter boxes and soil used by cats. These carry germs that can cause birth defects in the baby and can cause a loss of your baby (miscarriage) or stillbirth.  Visit your dentist. At home, brush your teeth with a soft toothbrush. Be gentle when you floss. Contact a doctor if:  You are dizzy.  You have mild cramps or pressure in your lower belly.  You have a nagging pain in your belly area.  You continue to feel sick to your stomach, you throw up, or you have watery poop (diarrhea).  You have a bad smelling fluid coming from your vagina.  You have pain when you pee (urinate).  You have increased puffiness (swelling) in your face, hands, legs, or ankles. Get help right away if:  You have a fever.  You are leaking fluid from your vagina.  You have spotting or bleeding from your vagina.  You have very bad belly cramping or pain.  You gain or lose weight rapidly.  You throw up blood. It may look like coffee grounds.  You are around people who have [place] measles, fifth disease, or chickenpox.  You have  a very bad headache.  You have shortness of breath.  You have any kind of trauma, such as from a fall or a car accident. Summary  The first trimester of pregnancy is from week 1 until the end of week 13 (months 1 through 3).  To take care of yourself and your unborn baby, you will need to eat healthy meals, take medicines only if your doctor tells you to do so, and do activities that are safe for you and your baby.  Keep all follow-up visits as told by your doctor. This is important as your doctor will have to ensure that your baby is healthy and growing well. This information is not intended to replace advice given to you by your health care provider. Make sure you discuss any questions you have with your health care provider. Document Released: 06/24/2007 Document Revised: 04/28/2018 Document Reviewed: 01/14/2016 Elsevier Patient Education  2020 [employer]. Commonly Asked Questions During Pregnancy  Cats: A parasite can be excreted in cat feces.  To avoid exposure you need to have another person empty the little box.  If you must empty the litter box you will need to wear gloves.  Wash your hands after handling your cat.  This parasite can also be found in raw or undercooked meat so this should also be avoided.  Colds, Sore Throats, Flu: Please check your medication sheet to see what you can take for symptoms.  If your symptoms are unrelieved by these medications please call the office.  Dental Work: Most any dental work Investment banker, corporate recommends is permitted.  X-rays should only be taken during the first trimester if absolutely necessary.  Your abdomen should be shielded with a lead apron during all x-rays.  Please notify your provider prior to receiving any x-rays.  Novocaine is fine; gas is not recommended.  If your dentist requires a note from [place] prior to dental work please call the office and we will provide one for you.  Exercise: Exercise  is an important part of staying healthy  during your pregnancy.  You may continue most exercises you were accustomed to prior to pregnancy.  Later in your pregnancy you will most likely notice you have difficulty with activities requiring balance like riding a bicycle.  It is important that you listen to your body and avoid activities that put you at a higher risk of falling.  Adequate rest and staying well hydrated are a must!  If you have questions about the safety of specific activities ask your provider.    Exposure to Children with illness: Try to avoid obvious exposure; report any symptoms to [place] when noted,  If you have chicken pos, red measles or mumps, you should be immune to these diseases.   Please do not take any vaccines while pregnant unless you have checked with your OB provider.  Fetal Movement: After 28 weeks we recommend you do "kick counts" twice daily.  Lie or sit down in a calm quiet environment and count your baby movements "kicks".  You should feel your baby at least 10 times per hour.  If you have not felt 10 kicks within the first hour get up, walk around and have something sweet to eat or drink then repeat for an additional hour.  If count remains less than 10 per hour notify your provider.  Fumigating: Follow your pest control agent's advice as to how long to stay out of your home.  Ventilate the area well before re-entering.  Hemorrhoids:   Most over-the-counter preparations can be used during pregnancy.  Check your medication to see what is safe to use.  It is important to use a stool softener or fiber in your diet and to drink lots of liquids.  If hemorrhoids seem to be getting worse please call the office.   Hot Tubs:  Hot tubs Jacuzzis and saunas are not recommended while pregnant.  These increase your internal body temperature and should be avoided.  Intercourse:  Sexual intercourse is safe during pregnancy as long as you are comfortable, unless otherwise advised by your provider.  Spotting may occur after  intercourse; report any bright red bleeding that is heavier than spotting.  Labor:  If you know that you are in labor, please go to the hospital.  If you are unsure, please call the office and let us help you decide what to do.  Lifting, straining, etc:  If your job requires heavy lifting or straining please check with your provider for any limitations.  Generally, you should not lift items heavier than that you can lift simply with your hands and arms (no back muscles)  Painting:  Paint fumes do not harm your pregnancy, but may make you ill and should be avoided if possible.  Latex or water based paints have less odor than oils.  Use adequate ventilation while painting.  Permanents & Hair Color:  Chemicals in hair dyes are not recommended as they cause increase hair dryness which can increase hair loss during pregnancy.  " Highlighting" and permanents are allowed.  Dye may be absorbed differently and permanents may not hold as well during pregnancy.  Sunbathing:  Use a sunscreen, as skin burns easily during pregnancy.  Drink plenty of fluids; avoid over heating.  Tanning Beds:  Because their possible side effects are still unknown, tanning beds are not recommended.  Ultrasound Scans:  Routine ultrasounds are performed at approximately 20 weeks.  You will be able to see your baby's general anatomy an if you  would like to know the gender this can usually be determined as well.  If it is questionable when you conceived you may also receive an ultrasound early in your pregnancy for dating purposes.  Otherwise ultrasound exams are not routinely performed unless there is a medical necessity.  Although you can request a scan we ask that you pay for it when conducted because insurance does not cover " patient request" scans.  Work: If your pregnancy proceeds without complications you may work until your due date, unless your physician or employer advises otherwise.  Round Ligament Pain/Pelvic Discomfort:   Sharp, shooting pains not associated with bleeding are fairly common, usually occurring in the second trimester of pregnancy.  They tend to be worse when standing up or when you remain standing for long periods of time.  These are the result of pressure of certain pelvic ligaments called "round ligaments".  Rest, Tylenol and heat seem to be the most effective relief.  As the womb and fetus grow, they rise out of the pelvis and the discomfort improves.  Please notify the office if your pain seems different than that described.  It may represent a more serious condition.  Common Medications Safe in Pregnancy  Acne:      Constipation:  Benzoyl Peroxide     Colace  Clindamycin      Dulcolax Suppository  Topica Erythromycin     Fibercon  Salicylic Acid      Metamucil         Miralax AVOID:        Senakot   Accutane    Cough:  Retin-A       Cough Drops  Tetracycline      Phenergan w/ Codeine if Rx  Minocycline      Robitussin (Plain & DM)  Antibiotics:     Crabs/Lice:  Ceclor       RID  Cephalosporins    AVOID:  E-Mycins      Kwell  Keflex  Macrobid/Macrodantin   Diarrhea:  Penicillin      Kao-Pectate  Zithromax      Imodium AD         PUSH FLUIDS AVOID:       Cipro     Fever:  Tetracycline      Tylenol (Regular or Extra  Minocycline       Strength)  Levaquin      Extra Strength-Do not          Exceed 8 tabs/24 hrs Caffeine:        <250m/day (equiv. To 1 cup of coffee or  approx. 3 12 oz sodas)         Gas: Cold/Hayfever:       Gas-X  Benadryl      Mylicon  Claritin       Phazyme  **Claritin-D        Chlor-Trimeton    Headaches:  Dimetapp      ASA-Free Excedrin  Drixoral-Non-Drowsy     Cold Compress  Mucinex (Guaifenasin)     Tylenol (Regular or Extra  Sudafed/Sudafed-12 Hour     Strength)  **Sudafed PE Pseudoephedrine   Tylenol Cold & Sinus     Vicks Vapor Rub  Zyrtec  **AVOID if Problems With Blood Pressure         Heartburn: Avoid lying down for at least 1 hour  after meals  Aciphex      Maalox     Rash:  Milk of Magnesia  Benadryl    Mylanta       1% Hydrocortisone Cream  Pepcid  Pepcid Complete   Sleep Aids:  Prevacid      Ambien   Prilosec       Benadryl  Rolaids       Chamomile Tea  Tums (Limit 4/day)     Unisom  Zantac       Tylenol PM         Warm milk-add vanilla or  Hemorrhoids:       Sugar for taste  Anusol/Anusol H.C.  (RX: Analapram 2.5%)  Sugar Substitutes:  Hydrocortisone OTC     Ok in moderation  Preparation H      Tucks        Vaseline lotion applied to tissue with wiping    Herpes:     Throat:  Acyclovir      Oragel  Famvir  Valtrex     Vaccines:         Flu Shot Leg Cramps:       *Gardasil  Benadryl      Hepatitis A         Hepatitis B Nasal Spray:       Pneumovax  Saline Nasal Spray     Polio Booster         Tetanus Nausea:       Tuberculosis test or PPD  Vitamin B6 25 mg TID   AVOID:    Dramamine      *Gardasil  Emetrol       Live Poliovirus  Ginger Root 250 mg QID    MMR (measles, mumps &  High Complex Carbs @ Bedtime    rebella)  Sea Bands-Accupressure    Varicella (Chickenpox)  Unisom 1/2 tab TID     *No known complications           If received before Pain:         Known pregnancy;   Darvocet       Resume series after  Lortab        Delivery  Percocet    Yeast:   Tramadol      Femstat  Tylenol 3      Gyne-lotrimin  Ultram       Monistat  Vicodin           MISC:         All Sunscreens           Hair Coloring/highlights          Insect Repellant's          (Including DEET)         Mystic Tans

## 2018-10-31 NOTE — Progress Notes (Signed)
      Diamond Sutton presents for NOB nurse intake visit. Pregnancy confirmation done at [facility], 10/03/18, with Ophelia Shoulder.  G 10.  P 5126.  LMP 08/25/2018.  EDD 06/01/19.  [place] [redacted]w[redacted]d. Pregnancy education material explained and given.  0 cats in the home.  NOB labs ordered. BMI less than 30. TSH/HbgA1c not ordered. Sickle cell order due to race. HIV and drug screen explained and ordered. Genetic screening discussed. Genetic testing; Unsure. Pt to discuss genetic testing with provider. PNV encouraged. Pt to follow up with provider in 2 weeks for NOB physical. FMLA and [place] reviewed and signed by patient.    BP 131/83   Pulse 82   Ht 5\' 6"  (1.676 m)   Wt 159 lb 1.6 oz (72.2 kg)   LMP 08/25/2018 (Exact Date)   BMI 25.68 kg/m

## 2018-11-01 LAB — MICROSCOPIC EXAMINATION
Casts: NONE SEEN /lpf
Epithelial Cells (non renal): >10 /hpf — AB (ref 0–10)

## 2018-11-01 LAB — ABO AND RH: Rh Factor: POSITIVE

## 2018-11-01 LAB — URINALYSIS, ROUTINE W REFLEX MICROSCOPIC
Bilirubin, UA: NEGATIVE
Glucose, UA: NEGATIVE
Ketones, UA: NEGATIVE
Nitrite, UA: NEGATIVE
RBC, UA: NEGATIVE
Specific Gravity, UA: 1.023 (ref 1.005–1.030)
Urobilinogen, Ur: 1 mg/dL (ref 0.2–1.0)
pH, UA: 7.5 (ref 5.0–7.5)

## 2018-11-01 LAB — TOXOPLASMA ANTIBODIES- IGG AND  IGM
Toxoplasma Antibody- IgM: <3 AU/mL (ref 0.0–7.9)
Toxoplasma IgG Ratio: <3 IU/mL (ref 0.0–7.1)

## 2018-11-01 LAB — VARICELLA ZOSTER ANTIBODY, IGG: Varicella zoster IgG: 510 index (ref >165)

## 2018-11-01 LAB — RPR: RPR Ser Ql: NONREACTIVE

## 2018-11-01 LAB — RUBELLA SCREEN: Rubella Antibodies, IGG: 1.92 index (ref >0.99)

## 2018-11-01 LAB — HGB SOLU + RFLX FRAC: Sickle Solubility Test - HGBRFX: NEGATIVE

## 2018-11-01 LAB — HIV ANTIBODY (ROUTINE TESTING W REFLEX): HIV Screen 4th Generation wRfx: NONREACTIVE

## 2018-11-02 ENCOUNTER — Telehealth: Payer: Self-pay

## 2018-11-02 ENCOUNTER — Telehealth: Payer: Self-pay | Admitting: Certified Nurse Midwife

## 2018-11-02 LAB — DRUG PROFILE, UR, 9 DRUGS (LABCORP)
Amphetamines, Urine: NEGATIVE ng/mL
Barbiturate Quant, Ur: NEGATIVE ng/mL
Benzodiazepine Quant, Ur: NEGATIVE ng/mL
Cannabinoid Quant, Ur: NEGATIVE ng/mL
Cocaine (Metab.): NEGATIVE ng/mL
Methadone Screen, Urine: NEGATIVE ng/mL
Opiate Quant, Ur: NEGATIVE ng/mL
PCP Quant, Ur: NEGATIVE ng/mL
Propoxyphene: NEGATIVE ng/mL

## 2018-11-02 LAB — NICOTINE SCREEN, URINE: Cotinine Ql Scrn, Ur: NEGATIVE ng/mL

## 2018-11-02 LAB — URINE CULTURE, OB REFLEX

## 2018-11-02 LAB — CULTURE, OB URINE

## 2018-11-02 NOTE — Telephone Encounter (Signed)
The patient called and stated that she went to the dentist and had two teeth pulled and was given to prescriptions that she needs to verify she is able to take. Pt is requesting call back. Please advise.

## 2018-11-02 NOTE — Telephone Encounter (Signed)
mychart message sent regarding her question about medicine she was given for tooth extraction. Per AT clindamycin is safe for patient to take. Mychart message reply was sent to patient.

## 2018-11-02 NOTE — Telephone Encounter (Signed)
Left voicemail message at number listed in message for patient to return my call. Mychart message also sent regarding her questions on medications she was given.

## 2018-11-04 ENCOUNTER — Telehealth: Payer: Self-pay

## 2018-11-04 NOTE — Telephone Encounter (Signed)
error 

## 2018-11-05 LAB — GC/CHLAMYDIA PROBE AMP
Chlamydia trachomatis, NAA: POSITIVE — AB
Neisseria Gonorrhoeae by PCR: NEGATIVE

## 2018-11-07 ENCOUNTER — Other Ambulatory Visit: Payer: Self-pay | Admitting: Certified Nurse Midwife

## 2018-11-07 MED ORDER — AZITHROMYCIN 500 MG PO TABS: 1000.0000 mg | ORAL_TABLET | Freq: Once | ORAL | 0 refills | Status: AC

## 2018-11-07 NOTE — Progress Notes (Signed)
Vaginal swab positive for Chlamydia, orders placed for treatment.   Zanobia Griebel, CNM  

## 2018-11-08 ENCOUNTER — Other Ambulatory Visit: Payer: Self-pay

## 2018-11-08 ENCOUNTER — Ambulatory Visit (INDEPENDENT_AMBULATORY_CARE_PROVIDER_SITE_OTHER): Payer: Medicaid Other

## 2018-11-08 DIAGNOSIS — Z3481 Encounter for supervision of other normal pregnancy, first trimester: Secondary | ICD-10-CM

## 2018-11-15 ENCOUNTER — Ambulatory Visit (INDEPENDENT_AMBULATORY_CARE_PROVIDER_SITE_OTHER): Payer: Medicaid Other | Admitting: Certified Nurse Midwife

## 2018-11-15 ENCOUNTER — Encounter: Payer: Self-pay | Admitting: Certified Nurse Midwife

## 2018-11-15 ENCOUNTER — Other Ambulatory Visit: Payer: Self-pay

## 2018-11-15 ENCOUNTER — Other Ambulatory Visit (HOSPITAL_COMMUNITY)
Admission: RE | Admit: 2018-11-15 | Discharge: 2018-11-15 | Disposition: A | Discharge status: Home / Self Care | Payer: Medicaid Other | Source: Ambulatory Visit | Attending: Certified Nurse Midwife | Admitting: Certified Nurse Midwife

## 2018-11-15 VITALS — BP 113/65 | HR 74 | Wt 163.0 lb

## 2018-11-15 DIAGNOSIS — Z113 Encounter for screening for infections with a predominantly sexual mode of transmission: Secondary | ICD-10-CM | POA: Insufficient documentation

## 2018-11-15 DIAGNOSIS — Z3481 Encounter for supervision of other normal pregnancy, first trimester: Secondary | ICD-10-CM | POA: Diagnosis not present

## 2018-11-15 DIAGNOSIS — Z124 Encounter for screening for malignant neoplasm of cervix: Secondary | ICD-10-CM | POA: Insufficient documentation

## 2018-11-15 NOTE — Progress Notes (Signed)
NEW OB HISTORY AND PHYSICAL  SUBJECTIVE:       Diamond Sutton is a 33 y.o. [id number] female, Patient's last menstrual period was 08/25/2018 (exact date)., Estimated Date of Delivery: 06/01/19, [redacted]w[redacted]d, presents today for establishment of Prenatal Care. She complains of nausea     Gynecologic History Patient's last menstrual period was 08/25/2018 (exact date). Normal Contraception: none Last Pap: .2017 Results were: normal  Obstetric History OB History  Gravida Para Term Preterm AB Living  10 6 5 1 2 6   SAB TAB Ectopic Multiple Live Births  1 0 1 0 6    # Outcome Date GA Lbr Len/2nd Weight Sex Delivery Anes PTL Lv  10 Current           9 Ectopic 2018          8 Preterm 12/30/14 [redacted]w[redacted]d  6 lb 0.7 oz (2.74 kg) M Vag-Spont EPI  LIV  7 Term 09/11/12 [redacted]w[redacted]d  7 lb 5 oz (3.317 kg) M Vag-Spont   LIV  6 Term 08/05/09 [redacted]w[redacted]d  6 lb 14 oz (3.118 kg) F Vag-Spont   LIV  5 Term 03/19/07 [redacted]w[redacted]d  7 lb 7 oz (3.374 kg) M Vag-Spont   LIV  4 Term 05/05/05 [redacted]w[redacted]d  6 lb 11 oz (3.033 kg) M Vag-Spont   LIV  3 SAB 2006 [redacted]w[redacted]d    SAB     2 Term 05/05/02 [redacted]w[redacted]d  6 lb 13 oz (3.09 kg) M Vag-Spont   LIV  1 Gravida             Past Medical History:  Diagnosis Date  . Anemia   . Depression 2001  . Ectopic pregnancy   . History of urinary tract infection   . Hypertension   . Ovarian cyst     Past Surgical History:  Procedure Laterality Date  . DILATION AND CURETTAGE OF UTERUS     SAB    Current Outpatient Medications on File Prior to Visit  Medication Sig Dispense Refill  . Doxylamine-Pyridoxine ER (BONJESTA) 20-20 MG TBCR Take 20 mg by mouth as directed. 60 tablet 1  . ondansetron (ZOFRAN) 4 MG tablet Take 1 tablet (4 mg total) by mouth every 8 (eight) hours as needed for nausea or vomiting. 20 tablet 2  . Prenatal Vit-Fe Fumarate-FA (PRENATAL VITAMIN) 27-0.8 MG TABS Take 1 tablet by mouth daily at 6 (six) AM. (Patient not taking: Reported on 10/31/2018) 100 tablet 0   No current facility-administered  medications on file prior to visit.     Allergies  Allergen Reactions  . Penicillins Anaphylaxis    12/2014: received ancef w/o complications  Has patient had a PCN reaction causing immediate rash, facial/tongue/throat swelling, SOB or lightheadedness with hypotension: Yes Has patient had a PCN reaction causing severe rash involving mucus membranes or skin necrosis: Yes Has patient had a PCN reaction that required hospitalization Yes Has patient had a PCN reaction occurring within the last 10 years: No If all of the above answers are "NO", then may proceed with Cephalosporin use.     Social History   Socioeconomic History  . Marital status: Single    Spouse name: Not on file  . Number of children: Not on file  . Years of education: Not on file  . Highest education level: Not on file  Occupational History  . Not on file  Social Needs  . Financial resource strain: Not on file  . Food insecurity    Worry: Not on file  Inability: Not on file  . Transportation needs    Medical: Not on file    Non-medical: Not on file  Tobacco Use  . Smoking status: Current Every Day Smoker    Packs/day: 0.25    Years: 15.00    Pack years: 3.75    Types: Cigarettes  . Smokeless tobacco: Never Used  Substance and Sexual Activity  . Alcohol use: Not Currently    Comment: Last ETOH use 10/02/2018 - counseled to not  use  . Drug use: Not Currently  . Sexual activity: Yes    Birth control/protection: None    Comment: Last used Nexplanon 02/2018  Lifestyle  . Physical activity    Days per week: Not on file    Minutes per session: Not on file  . Stress: Not on file  Relationships  . Social Herbalist on phone: Not on file    Gets together: Not on file    Attends religious service: Not on file    Active member of club or organization: Not on file    Attends meetings of clubs or organizations: Not on file    Relationship status: Not on file  . Intimate partner violence    Fear  of current or ex partner: No    Emotionally abused: No    Physically abused: No    Forced sexual activity: No  Other Topics Concern  . Not on file  Social History Narrative  . Not on file    Family History  Problem Relation Age of Onset  . Diabetes Mother   . Hypertension Mother   . Heart murmur Half-Brother   . Breast cancer Neg Hx   . Ovarian cancer Neg Hx   . Colon cancer Neg Hx   . Heart disease Neg Hx     The following portions of the patient's history were reviewed and updated as appropriate: allergies, current medications, past OB history, past medical history, past surgical history, past family history, past social history, and problem list.    OBJECTIVE: Initial Physical Exam (New OB)  GENERAL APPEARANCE: alert, well appearing, in no apparent distress, oriented to person, place and time HEAD: normocephalic, atraumatic MOUTH: mucous membranes moist, pharynx normal without lesions THYROID: no thyromegaly or masses present BREASTS: no masses noted, no significant tenderness, no palpable axillary nodes, no skin changes LUNGS: clear to auscultation, no wheezes, rales or rhonchi, symmetric air entry HEART: regular rate and rhythm, no murmurs ABDOMEN: soft, nontender, nondistended, no abnormal masses, no epigastric pain EXTREMITIES: no redness or tenderness in the calves or thighs SKIN: normal coloration and turgor, no rashes LYMPH NODES: no adenopathy palpable NEUROLOGIC: alert, oriented, normal speech, no focal findings or movement disorder noted  PELVIC EXAM EXTERNAL GENITALIA: normal appearing vulva with no masses, tenderness or lesions VAGINA: no abnormal discharge or lesions CERVIX: no lesions or cervical motion tenderness UTERUS: gravid ADNEXA: no masses palpable and nontender OB EXAM PELVIMETRY: appears adequate RECTUM: exam not indicated  ASSESSMENT: Normal pregnancy  PLAN: Prenatal care New OB counseling: The patient has been given an overview  regarding routine prenatal care. Recommendations regarding diet, weight gain, and exercise in pregnancy were given. Prenatal testing, optional genetic testing, carrier screening, and ultrasound use in pregnancy were reviewed. Panorama testing today, pregnancy medical home form completed.  Benefits of Breast Feeding were discussed. The patient is encouraged to consider nursing her baby post partum. Follow up 4 wks for early glucose screening and ROB with Melody

## 2018-11-15 NOTE — Patient Instructions (Signed)

## 2018-11-16 LAB — CBC
Hematocrit: 33.6 % — ABNORMAL LOW (ref 34.0–46.6)
Hemoglobin: 11.3 g/dL (ref 11.1–15.9)
MCH: 29.6 pg (ref 26.6–33.0)
MCHC: 33.6 g/dL (ref 31.5–35.7)
MCV: 88 fL (ref 79–97)
Platelets: 320 10*3/uL (ref 150–450)
RBC: 3.82 x10E6/uL (ref 3.77–5.28)
RDW: 12.7 % (ref 11.7–15.4)
WBC: 14.4 10*3/uL — ABNORMAL HIGH (ref 3.4–10.8)

## 2018-11-18 LAB — CERVICOVAGINAL ANCILLARY ONLY
Bacterial Vaginitis (gardnerella): POSITIVE — AB
Candida Glabrata: NEGATIVE
Candida Vaginitis: POSITIVE — AB
Comment: NEGATIVE
Comment: NEGATIVE
Comment: NEGATIVE

## 2018-11-21 ENCOUNTER — Other Ambulatory Visit: Payer: Self-pay | Admitting: Certified Nurse Midwife

## 2018-11-21 ENCOUNTER — Telehealth: Payer: Self-pay | Admitting: Family Medicine

## 2018-11-21 ENCOUNTER — Telehealth: Payer: Self-pay

## 2018-11-21 MED ORDER — METRONIDAZOLE 0.75 % VA GEL: 1.0000 | Freq: Every day | VAGINAL | 0 refills | Status: AC

## 2018-11-21 MED ORDER — FLUCONAZOLE 150 MG PO TABS: 150.0000 mg | ORAL_TABLET | Freq: Once | ORAL | 0 refills | Status: AC

## 2018-11-21 NOTE — Telephone Encounter (Signed)
mychart message sent- [facility] had [facility] in [place] as the pharmacy of choice.

## 2018-11-21 NOTE — Telephone Encounter (Signed)
Pt called states she will pick up prescription at [facility] in [place] today but for all future presciptions she will use [facility] in [place].  Please remove Verlene Mayer from preferred pharmacy list per pt req.

## 2018-11-21 NOTE — Telephone Encounter (Signed)
[facility] in [place] was the preferred pharmacy listed.

## 2018-11-21 NOTE — Telephone Encounter (Signed)
Called to reschedule NEWOB, no answer

## 2018-11-21 NOTE — Progress Notes (Signed)
Pt vaginal swab positive for yeast and BV, orders placed for treatment. Pt notified.   Philip Aspen, CNM

## 2018-11-22 LAB — CYTOLOGY - PAP
Comment: NEGATIVE
Diagnosis: NEGATIVE
High risk HPV: NEGATIVE

## 2018-11-23 ENCOUNTER — Encounter: Payer: Self-pay | Admitting: *Deleted

## 2018-11-23 ENCOUNTER — Other Ambulatory Visit: Payer: Self-pay

## 2018-11-23 ENCOUNTER — Emergency Department: Payer: Medicaid Other

## 2018-11-23 ENCOUNTER — Emergency Department
Admission: EM | Admit: 2018-11-23 | Discharge: 2018-11-23 | Disposition: A | Discharge status: Home / Self Care | Payer: Medicaid Other | Attending: Emergency Medicine | Admitting: Emergency Medicine

## 2018-11-23 DIAGNOSIS — R1031 Right lower quadrant pain: Secondary | ICD-10-CM | POA: Diagnosis not present

## 2018-11-23 DIAGNOSIS — Z3A12 12 weeks gestation of pregnancy: Secondary | ICD-10-CM | POA: Diagnosis not present

## 2018-11-23 DIAGNOSIS — O26891 Other specified pregnancy related conditions, first trimester: Secondary | ICD-10-CM | POA: Diagnosis not present

## 2018-11-23 DIAGNOSIS — N83201 Unspecified ovarian cyst, right side: Secondary | ICD-10-CM | POA: Diagnosis not present

## 2018-11-23 DIAGNOSIS — O3481 Maternal care for other abnormalities of pelvic organs, first trimester: Secondary | ICD-10-CM | POA: Diagnosis not present

## 2018-11-23 LAB — URINALYSIS, COMPLETE (UACMP) WITH MICROSCOPIC
Bacteria, UA: NONE SEEN
Bilirubin Urine: NEGATIVE
Glucose, UA: NEGATIVE mg/dL
Hgb urine dipstick: NEGATIVE
Ketones, ur: NEGATIVE mg/dL
Leukocytes,Ua: NEGATIVE
Nitrite: NEGATIVE
Protein, ur: NEGATIVE mg/dL
Specific Gravity, Urine: 1.015 (ref 1.005–1.030)
pH: 7 (ref 5.0–8.0)

## 2018-11-23 LAB — COMPREHENSIVE METABOLIC PANEL
ALT: 24 U/L (ref 0–44)
AST: 24 U/L (ref 15–41)
Albumin: 3.9 g/dL (ref 3.5–5.0)
Alkaline Phosphatase: 54 U/L (ref 38–126)
Anion gap: 9 (ref 5–15)
BUN: 7 mg/dL (ref 6–20)
CO2: 21 mmol/L — ABNORMAL LOW (ref 22–32)
Calcium: 8.8 mg/dL — ABNORMAL LOW (ref 8.9–10.3)
Chloride: 107 mmol/L (ref 98–111)
Creatinine, Ser: 0.55 mg/dL (ref 0.44–1.00)
GFR calc Af Amer: >60 mL/min (ref >60)
GFR calc non Af Amer: >60 mL/min (ref >60)
Glucose, Bld: 109 mg/dL — ABNORMAL HIGH (ref 70–99)
Potassium: 3.5 mmol/L (ref 3.5–5.1)
Sodium: 137 mmol/L (ref 135–145)
Total Bilirubin: 0.4 mg/dL (ref 0.3–1.2)
Total Protein: 7.2 g/dL (ref 6.5–8.1)

## 2018-11-23 LAB — CBC
HCT: 34 % — ABNORMAL LOW (ref 36.0–46.0)
Hemoglobin: 11.4 g/dL — ABNORMAL LOW (ref 12.0–15.0)
MCH: 29.5 pg (ref 26.0–34.0)
MCHC: 33.5 g/dL (ref 30.0–36.0)
MCV: 87.9 fL (ref 80.0–100.0)
Platelets: 296 10*3/uL (ref 150–400)
RBC: 3.87 MIL/uL (ref 3.87–5.11)
RDW: 13.2 % (ref 11.5–15.5)
WBC: 13.3 10*3/uL — ABNORMAL HIGH (ref 4.0–10.5)
nRBC: 0 % (ref 0.0–0.2)

## 2018-11-23 LAB — PREGNANCY, URINE: Preg Test, Ur: POSITIVE — AB

## 2018-11-23 LAB — HCG, QUANTITATIVE, PREGNANCY: hCG, Beta Chain, Quant, S: 40116 m[IU]/mL — ABNORMAL HIGH (ref <5)

## 2018-11-23 NOTE — ED Provider Notes (Signed)
MSE was initiated and I personally evaluated the patient and placed orders (if any) at  11:20 PM on November 23, 2018.  The patient appears stable so that the remainder of the MSE may be completed by another provider.   Assumed care for Dr. Harvest Dark.  MRI results returned with no findings suggestive of appendicitis.  Relayed results to patient and advised her to continue taking Tylenol and applying warm compresses to affected area at home.  Gave patient return precautions to return to the emergency department with worsening abdominal pain, nausea, vomiting or fever.  She voiced understanding and has easy access to the emergency department should symptoms worsen.   Vallarie Mare [place], PA-C 11/23/18 [id number]    Harvest Dark, MD 11/24/18 1446

## 2018-11-23 NOTE — ED Triage Notes (Signed)
Pt ambulatory to triage.  Pt has right lower abd pain  Pt is approx [redacted] weeks pregnant.  No vag bleeding.  No vag discharge.  No urinary sx   Pt alert.

## 2018-11-23 NOTE — ED Provider Notes (Signed)
Procedures     ----------------------------------------- 11:07 PM on 11/23/2018 -----------------------------------------   Received a call from radiology with preliminary MRI read.  The report that they do not see any signs of appendicitis such as free fluid or inflammatory changes.  Will give return precautions and discharge, pain corresponding to ovarian cyst without evidence of torsion   Carrie Mew, MD 11/23/18 2308

## 2018-11-23 NOTE — Discharge Instructions (Signed)
You have been diagnosed with a right-sided ovarian cyst. MRI of your abdomen does not currently show findings consistent with appendicitis. Continue to take Tylenol at home for abdominal pain. Return to the emergency department with worsening abdominal pain, nausea, vomiting or fever.

## 2018-11-23 NOTE — ED Notes (Signed)
Pt taken to MRI via stretcher.  

## 2018-11-23 NOTE — ED Provider Notes (Signed)
[facility] Emergency Department Provider Note  Time seen: 6:54 PM  I have reviewed the triage vital signs and the nursing notes.   HISTORY  Chief Complaint Abdominal Pain   HPI Diamond Sutton is a 33 y.o. female with a past medical history of anemia, hypertension, G9, P7 A1 presents to the emergency department approximately [redacted] weeks pregnant with right lower quadrant abdominal pain.  According to the patient since early this morning she was awoken with right lower quadrant abdominal pain which has progressed throughout the day describes it as a moderate sharp pain.  Patient states a history of a tubal pregnancy in the past which is felt somewhat similar.  She is [redacted] weeks pregnant but has not yet had an ultrasound.  No fever cough or shortness of breath.  No vomiting or diarrhea.   Past Medical History:  Diagnosis Date  . Anemia   . Depression 2001  . Ectopic pregnancy   . History of urinary tract infection   . Hypertension   . Ovarian cyst     Patient Active Problem List   Diagnosis Date Noted  . Gestational diabetes 11/20/2016  . Itching 11/17/2016  . Back pain affecting pregnancy in third trimester 10/05/2016  . 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 DILATION AND CURETTAGE OF UTERUS     SAB    Prior to Admission medications   Medication Sig Start Date End Date Taking? Authorizing Provider  Doxylamine-Pyridoxine ER (BONJESTA) 20-20 MG TBCR Take 20 mg by mouth as directed. 10/31/18   Doreene Burkehompson, Annie, CNM  metroNIDAZOLE (METROGEL VAGINAL) 0.75 % vaginal gel Place 1 Applicatorful vaginally daily for 5 days. 11/21/18 11/26/18  Doreene Burkehompson, Annie, CNM  ondansetron (ZOFRAN) 4 MG tablet Take 1 tablet (4 mg total) by mouth every 8 (eight) hours as needed for nausea or vomiting. 10/31/18   Doreene Burkehompson, Annie, CNM  Prenatal Vit-Fe Fumarate-FA (PRENATAL VITAMIN) 27-0.8 MG TABS Take 1 tablet by mouth daily at 6 (six)  AM. 10/03/18   Federico FlakeNewton, Kimberly Niles, MD    Allergies  Allergen Reactions  . Penicillins Anaphylaxis    12/2014: received ancef w/o complications  Has patient had a PCN reaction causing immediate rash, facial/tongue/throat swelling, SOB or lightheadedness with hypotension: Yes Has patient had a PCN reaction causing severe rash involving mucus membranes or skin necrosis: Yes Has patient had a PCN reaction that required hospitalization Yes Has patient had a PCN reaction occurring within the last 10 years: No If all of the above answers are "NO", then may proceed with Cephalosporin use.     Family History  Problem Relation Age of Onset  . Diabetes Mother   . Hypertension Mother   . Heart murmur Half-Brother   . Breast cancer Neg Hx   . Ovarian cancer Neg Hx   . Colon cancer Neg Hx   . Heart disease Neg Hx     Social History Social History   Tobacco Use  . Smoking status: Former Smoker    Packs/day: 0.25    Years: 15.00    Pack years: 3.75    Types: Cigarettes  . Smokeless tobacco: Never Used  Substance Use Topics  . Alcohol use: Not Currently    Comment: Last ETOH use 10/02/2018 - counseled to not  use  . Drug use: Not Currently    Review of Systems Constitutional: Negative for fever. Cardiovascular: Negative for chest pain. Respiratory: Negative for shortness of breath. Gastrointestinal: Right  lower quadrant abdominal pain.  Negative nausea vomiting diarrhea Genitourinary: Negative for vaginal bleeding or discharge. Musculoskeletal: Negative for musculoskeletal complaints Skin: Negative for skin complaints  Neurological: Negative for headache All other ROS negative  ____________________________________________   PHYSICAL EXAM:  VITAL SIGNS: ED Triage Vitals  Enc Vitals Group     BP 11/23/18 1653 (!) 144/83     Pulse Rate 11/23/18 1653 100     Resp 11/23/18 1653 18     Temp 11/23/18 1653 99 F (37.2 C)     Temp Source 11/23/18 1653 Oral     SpO2  11/23/18 1653 98 %     Weight 11/23/18 1654 163 lb (73.9 kg)     Height 11/23/18 1654 5\' 6"  (1.676 m)     Head Circumference --      Peak Flow --      Pain Score 11/23/18 1654 9     Pain Loc --      Pain Edu? --      Excl. in [place]? --     Constitutional: Alert and oriented. Well appearing and in no distress. Eyes: Normal exam ENT      Head: Normocephalic and atraumatic.      Mouth/Throat: Mucous membranes are moist. Cardiovascular: Normal rate, regular rhythm.  Respiratory: Normal respiratory effort without tachypnea nor retractions. Breath sounds are clear Gastrointestinal: Soft, moderate right lower quadrant tenderness palpation without rebound guarding or distention. Musculoskeletal: Nontender with normal range of motion in all extremities. Neurologic:  Normal speech and language. No gross focal neurologic deficits Skin:  Skin is warm, dry and intact.  Psychiatric: Mood and affect are normal.   ____________________________________________   RADIOLOGY  Ultrasound shows 12-week intrauterine pregnancy with a 2 cm right ovarian cyst possibly the cause of the patient's discomfort.  ____________________________________________   INITIAL IMPRESSION / ASSESSMENT AND PLAN / ED COURSE  Pertinent labs & imaging results that were available during my care of the patient were reviewed by me and considered in my medical decision making (see chart for details).   Patient presents to the emergency department for right lower quadrant abdominal pain since early this morning.  States it awoke her from her sleep and has progressed throughout the day.  Moderate tenderness palpation the right lower quadrant on exam.  Patient is approximate [redacted] weeks pregnant by LMP.  We will proceed with an ultrasound to rule out tubal/ectopic pregnancy.  However even if the ultrasound is normal patient will likely require an MRI of the abdomen to rule out appendicitis.  Patient agreeable to plan of care.  Lab work  thus far shows a mild leukocytosis of 13,300 otherwise nonrevealing.  Ultrasound shows a 2 cm right ovarian cyst in addition to a 12-week intrauterine pregnancy.  However given the patient's tenderness palpation and leukocytosis we will proceed with MRI to rule out appendicitis.  Patient agreeable plan of care.  Patient care signed out to physician assistant Kern Reap.  Diamond Sutton was evaluated in Emergency Department on 11/23/2018 for the symptoms described in the history of present illness. She was evaluated in the context of the global COVID-19 pandemic, which necessitated consideration that the patient might be at risk for infection with the SARS-CoV-2 virus that causes COVID-19. Institutional protocols and algorithms that pertain to the evaluation of patients at risk for COVID-19 are in a state of rapid change based on information released by regulatory bodies including the CDC and federal and state organizations. These policies and algorithms were followed  during the patient's care in the ED.  ____________________________________________   FINAL CLINICAL IMPRESSION(S) / ED DIAGNOSES  Right lower quadrant abdominal pain   Minna Antis, MD 11/24/18 1448

## 2018-12-01 ENCOUNTER — Telehealth: Payer: Self-pay

## 2018-12-01 NOTE — Telephone Encounter (Signed)
Mychart message sent to patient- cramping/ spotting?

## 2018-12-02 ENCOUNTER — Encounter: Payer: Self-pay | Admitting: Certified Nurse Midwife

## 2018-12-02 ENCOUNTER — Ambulatory Visit (INDEPENDENT_AMBULATORY_CARE_PROVIDER_SITE_OTHER): Payer: Medicaid Other | Admitting: Certified Nurse Midwife

## 2018-12-02 ENCOUNTER — Other Ambulatory Visit: Payer: Self-pay

## 2018-12-02 ENCOUNTER — Other Ambulatory Visit (HOSPITAL_COMMUNITY)
Admission: RE | Admit: 2018-12-02 | Discharge: 2018-12-02 | Disposition: A | Discharge status: Home / Self Care | Payer: Medicaid Other | Source: Ambulatory Visit | Attending: Certified Nurse Midwife | Admitting: Certified Nurse Midwife

## 2018-12-02 VITALS — BP 129/83 | HR 99 | Wt 162.2 lb

## 2018-12-02 DIAGNOSIS — O26892 Other specified pregnancy related conditions, second trimester: Secondary | ICD-10-CM | POA: Diagnosis not present

## 2018-12-02 DIAGNOSIS — N898 Other specified noninflammatory disorders of vagina: Secondary | ICD-10-CM

## 2018-12-02 DIAGNOSIS — Z3A14 14 weeks gestation of pregnancy: Secondary | ICD-10-CM

## 2018-12-02 MED ORDER — ASPIRIN 81 MG PO TBEC: 81.0000 mg | DELAYED_RELEASE_TABLET | Freq: Every day | ORAL | 12 refills | Status: DC

## 2018-12-02 NOTE — Patient Instructions (Signed)
Glucose Tolerance Test During Pregnancy Why am I having this test? The glucose tolerance test (GTT) is done to check how your body processes sugar (glucose). This is one of several tests used to diagnose diabetes that develops during pregnancy (gestational diabetes mellitus). Gestational diabetes is a temporary form of diabetes that some women develop during pregnancy. It usually occurs during the second trimester of pregnancy and goes away after delivery. Testing (screening) for gestational diabetes usually occurs between 24 and 28 weeks of pregnancy. You may have the GTT test after having a 1-hour glucose screening test if the results from that test indicate that you may have gestational diabetes. You may also have this test if:  You have a history of gestational diabetes.  You have a history of giving birth to very large babies or have experienced repeated fetal loss (stillbirth).  You have signs and symptoms of diabetes, such as: ? Changes in your vision. ? Tingling or numbness in your hands or feet. ? Changes in hunger, thirst, and urination that are not otherwise explained by your pregnancy. What is being tested? This test measures the amount of glucose in your blood at different times during a period of 3 hours. This indicates how well your body is able to process glucose. What kind of sample is taken?  Blood samples are required for this test. They are usually collected by inserting a needle into a blood vessel. How do I prepare for this test?  For 3 days before your test, eat normally. Have plenty of carbohydrate-rich foods.  Follow instructions from your health care provider about: ? Eating or drinking restrictions on the day of the test. You may be asked to not eat or drink anything other than water (fast) starting 8-10 hours before the test. ? Changing or stopping your regular medicines. Some medicines may interfere with this test. Tell a health care provider about:  All  medicines you are taking, including vitamins, herbs, eye drops, creams, and over-the-counter medicines.  Any blood disorders you have.  Any surgeries you have had.  Any medical conditions you have. What happens during the test? First, your blood glucose will be measured. This is referred to as your fasting blood glucose, since you fasted before the test. Then, you will drink a glucose solution that contains a certain amount of glucose. Your blood glucose will be measured again 1, 2, and 3 hours after drinking the solution. This test takes about 3 hours to complete. You will need to stay at the testing location during this time. During the testing period:  Do not eat or drink anything other than the glucose solution.  Do not exercise.  Do not use any products that contain nicotine or tobacco, such as cigarettes and e-cigarettes. If you need help stopping, ask your health care provider. The testing procedure may vary among health care providers and hospitals. How are the results reported? Your results will be reported as milligrams of glucose per deciliter of blood (mg/dL) or millimoles per liter (mmol/L). Your health care provider will compare your results to normal ranges that were established after testing a large group of people (reference ranges). Reference ranges may vary among labs and hospitals. For this test, common reference ranges are:  Fasting: less than 95-105 mg/dL (5.3-5.8 mmol/L).  1 hour after drinking glucose: less than 180-190 mg/dL (10.0-10.5 mmol/L).  2 hours after drinking glucose: less than 155-165 mg/dL (8.6-9.2 mmol/L).  3 hours after drinking glucose: 140-145 mg/dL (7.8-8.1 mmol/L). What do the   results mean? Results within reference ranges are considered normal, meaning that your glucose levels are well-controlled. If two or more of your blood glucose levels are high, you may be diagnosed with gestational diabetes. If only one level is high, your health care  provider may suggest repeat testing or other tests to confirm a diagnosis. Talk with your health care provider about what your results mean. Questions to ask your health care provider Ask your health care provider, or the department that is doing the test:  When will my results be ready?  How will I get my results?  What are my treatment options?  What other tests do I need?  What are my next steps? Summary  The glucose tolerance test (GTT) is one of several tests used to diagnose diabetes that develops during pregnancy (gestational diabetes mellitus). Gestational diabetes is a temporary form of diabetes that some women develop during pregnancy.  You may have the GTT test after having a 1-hour glucose screening test if the results from that test indicate that you may have gestational diabetes. You may also have this test if you have any symptoms or risk factors for gestational diabetes.  Talk with your health care provider about what your results mean. This information is not intended to replace advice given to you by your health care provider. Make sure you discuss any questions you have with your health care provider. Document Released: 07/07/2011 Document Revised: 04/28/2018 Document Reviewed: 08/17/2016 Elsevier Patient Education  2020 Elsevier Inc.  

## 2018-12-02 NOTE — Progress Notes (Signed)
Pt present today for problem visit. She has increased discharge and some cramping. She states she sometimes has some pink with whipping. She is concerned about STD. She states her partner said that he went to health department for treatment but is not sure. Spec exam today white , particulate discharge, no blood seen . Swab collected. Discussed yeast. Will follow up with result. Discussed use of Asprin @ 16 wks she verbalize understanding. Follow up 2 wks as scheduled for glucose screen and ROB.   Philip Aspen, CNM

## 2018-12-05 DIAGNOSIS — O09511 Supervision of elderly primigravida, first trimester: Secondary | ICD-10-CM | POA: Diagnosis not present

## 2018-12-06 ENCOUNTER — Other Ambulatory Visit: Payer: Self-pay | Admitting: Certified Nurse Midwife

## 2018-12-06 LAB — CERVICOVAGINAL ANCILLARY ONLY
Bacterial Vaginitis (gardnerella): POSITIVE — AB
Candida Glabrata: NEGATIVE
Candida Vaginitis: POSITIVE — AB
Chlamydia: NEGATIVE
Comment: NEGATIVE
Comment: NEGATIVE
Comment: NEGATIVE
Comment: NEGATIVE
Comment: NEGATIVE
Comment: NORMAL
Neisseria Gonorrhea: NEGATIVE
Trichomonas: NEGATIVE

## 2018-12-06 MED ORDER — FLUCONAZOLE 150 MG PO TABS: 150.0000 mg | ORAL_TABLET | Freq: Once | ORAL | 0 refills | Status: AC

## 2018-12-06 MED ORDER — METRONIDAZOLE 500 MG PO TABS: 500.0000 mg | ORAL_TABLET | Freq: Two times a day (BID) | ORAL | 0 refills | Status: AC

## 2018-12-06 NOTE — Progress Notes (Signed)
Swab positive for BV and Yeast. Orders placed for treatment.   Philip Aspen, CNM

## 2018-12-13 ENCOUNTER — Other Ambulatory Visit: Payer: Medicaid Other

## 2018-12-13 ENCOUNTER — Encounter: Payer: Medicaid Other | Admitting: Obstetrics and Gynecology

## 2018-12-13 ENCOUNTER — Ambulatory Visit (INDEPENDENT_AMBULATORY_CARE_PROVIDER_SITE_OTHER): Payer: Medicaid Other | Admitting: Certified Nurse Midwife

## 2018-12-13 ENCOUNTER — Other Ambulatory Visit: Payer: Self-pay

## 2018-12-13 ENCOUNTER — Encounter: Payer: Self-pay | Admitting: Certified Nurse Midwife

## 2018-12-13 VITALS — BP 119/74 | HR 92 | Wt 164.2 lb

## 2018-12-13 DIAGNOSIS — Z3482 Encounter for supervision of other normal pregnancy, second trimester: Secondary | ICD-10-CM

## 2018-12-13 DIAGNOSIS — Z3481 Encounter for supervision of other normal pregnancy, first trimester: Secondary | ICD-10-CM

## 2018-12-13 DIAGNOSIS — Z1389 Encounter for screening for other disorder: Secondary | ICD-10-CM

## 2018-12-13 LAB — POCT URINALYSIS DIPSTICK OB
Bilirubin, UA: NEGATIVE
Blood, UA: NEGATIVE
Glucose, UA: NEGATIVE
Ketones, UA: NEGATIVE
Leukocytes, UA: NEGATIVE
Nitrite, UA: NEGATIVE
POC,PROTEIN,UA: NEGATIVE
Spec Grav, UA: >=1.03 — AB (ref 1.010–1.025)
Urobilinogen, UA: 0.2 E.U./dL
pH, UA: 5 (ref 5.0–8.0)

## 2018-12-13 NOTE — Addendum Note (Signed)
Addended by: Raliegh Ip on: 12/13/2018 04:19 PM   Modules accepted: Orders

## 2018-12-13 NOTE — Patient Instructions (Signed)

## 2018-12-13 NOTE — Progress Notes (Signed)
ROB doing well. Feeling flutters. Early glucose today. Pt to start baby aspirin in 2 days. She verbalizes and agrees to plan . Follow up 4-5 wks for anatomy u/s and ROB.   Philip Aspen, CNM

## 2018-12-14 ENCOUNTER — Other Ambulatory Visit: Payer: Self-pay | Admitting: Certified Nurse Midwife

## 2018-12-14 DIAGNOSIS — R7309 Other abnormal glucose: Secondary | ICD-10-CM

## 2018-12-14 LAB — ANTIBODY SCREEN: Antibody Screen: NEGATIVE

## 2018-12-14 LAB — GLUCOSE TOLERANCE, 1 HOUR: Glucose, 1Hr PP: 170 mg/dL (ref 65–199)

## 2018-12-14 NOTE — Progress Notes (Signed)
early 1 hr glucose screen elevated. 3 hr test ordered. Pt notified.   Philip Aspen, CNM

## 2018-12-16 LAB — GC/CHLAMYDIA PROBE AMP
Chlamydia trachomatis, NAA: NEGATIVE
Neisseria Gonorrhoeae by PCR: NEGATIVE

## 2018-12-19 ENCOUNTER — Encounter: Payer: Medicaid Other | Admitting: Certified Nurse Midwife

## 2018-12-19 ENCOUNTER — Other Ambulatory Visit: Payer: Medicaid Other

## 2018-12-20 ENCOUNTER — Other Ambulatory Visit: Payer: Self-pay

## 2018-12-20 ENCOUNTER — Ambulatory Visit (LOCAL_COMMUNITY_HEALTH_CENTER): Payer: Medicaid Other

## 2018-12-20 DIAGNOSIS — Z111 Encounter for screening for respiratory tuberculosis: Secondary | ICD-10-CM

## 2018-12-23 ENCOUNTER — Other Ambulatory Visit: Payer: Self-pay

## 2018-12-23 ENCOUNTER — Ambulatory Visit (LOCAL_COMMUNITY_HEALTH_CENTER): Payer: Medicaid Other

## 2018-12-23 ENCOUNTER — Other Ambulatory Visit: Payer: Medicaid Other

## 2018-12-23 DIAGNOSIS — Z111 Encounter for screening for respiratory tuberculosis: Secondary | ICD-10-CM

## 2018-12-23 LAB — TB SKIN TEST
Induration: 0 mm
TB Skin Test: NEGATIVE

## 2018-12-26 ENCOUNTER — Emergency Department
Admission: EM | Admit: 2018-12-26 | Discharge: 2018-12-27 | Disposition: A | Discharge status: Home / Self Care | Payer: Medicaid Other | Attending: Student | Admitting: Student

## 2018-12-26 ENCOUNTER — Other Ambulatory Visit: Payer: Self-pay

## 2018-12-26 ENCOUNTER — Emergency Department: Payer: Medicaid Other

## 2018-12-26 ENCOUNTER — Encounter: Payer: Self-pay | Admitting: Emergency Medicine

## 2018-12-26 DIAGNOSIS — Z87891 Personal history of nicotine dependence: Secondary | ICD-10-CM | POA: Insufficient documentation

## 2018-12-26 DIAGNOSIS — Z79899 Other long term (current) drug therapy: Secondary | ICD-10-CM | POA: Insufficient documentation

## 2018-12-26 DIAGNOSIS — I10 Essential (primary) hypertension: Secondary | ICD-10-CM | POA: Insufficient documentation

## 2018-12-26 DIAGNOSIS — R079 Chest pain, unspecified: Secondary | ICD-10-CM | POA: Insufficient documentation

## 2018-12-26 DIAGNOSIS — Z7982 Long term (current) use of aspirin: Secondary | ICD-10-CM | POA: Insufficient documentation

## 2018-12-26 DIAGNOSIS — R0789 Other chest pain: Secondary | ICD-10-CM | POA: Diagnosis not present

## 2018-12-26 DIAGNOSIS — Z3A17 17 weeks gestation of pregnancy: Secondary | ICD-10-CM | POA: Diagnosis not present

## 2018-12-26 DIAGNOSIS — O26892 Other specified pregnancy related conditions, second trimester: Secondary | ICD-10-CM | POA: Insufficient documentation

## 2018-12-26 LAB — BASIC METABOLIC PANEL
Anion gap: 9 (ref 5–15)
BUN: 9 mg/dL (ref 6–20)
CO2: 20 mmol/L — ABNORMAL LOW (ref 22–32)
Calcium: 8.5 mg/dL — ABNORMAL LOW (ref 8.9–10.3)
Chloride: 107 mmol/L (ref 98–111)
Creatinine, Ser: 0.48 mg/dL (ref 0.44–1.00)
GFR calc Af Amer: >60 mL/min (ref >60)
GFR calc non Af Amer: >60 mL/min (ref >60)
Glucose, Bld: 115 mg/dL — ABNORMAL HIGH (ref 70–99)
Potassium: 3.3 mmol/L — ABNORMAL LOW (ref 3.5–5.1)
Sodium: 136 mmol/L (ref 135–145)

## 2018-12-26 LAB — CBC
HCT: 34 % — ABNORMAL LOW (ref 36.0–46.0)
Hemoglobin: 11.3 g/dL — ABNORMAL LOW (ref 12.0–15.0)
MCH: 29.5 pg (ref 26.0–34.0)
MCHC: 33.2 g/dL (ref 30.0–36.0)
MCV: 88.8 fL (ref 80.0–100.0)
Platelets: 270 10*3/uL (ref 150–400)
RBC: 3.83 MIL/uL — ABNORMAL LOW (ref 3.87–5.11)
RDW: 13.7 % (ref 11.5–15.5)
WBC: 14.2 10*3/uL — ABNORMAL HIGH (ref 4.0–10.5)
nRBC: 0 % (ref 0.0–0.2)

## 2018-12-26 LAB — TROPONIN I (HIGH SENSITIVITY)
Troponin I (High Sensitivity): 3 ng/L (ref <18)
Troponin I (High Sensitivity): 4 ng/L (ref <18)

## 2018-12-26 NOTE — ED Notes (Signed)
Fetal HR 138

## 2018-12-26 NOTE — ED Notes (Signed)
Report given to Paige,RN.

## 2018-12-26 NOTE — ED Notes (Signed)
Lab informed light green tube for trop being sent down with pt label sticker due to chart being locked in [facility]

## 2018-12-26 NOTE — ED Notes (Signed)
Pt speaking with this RN in NAD, reports CP starting around noon that went away after 20 mins. Around 7:00 pm reports "started feeling funny" so she took 81 mg ASA and laid down. Reports left arm started tingling and went numb with soreness in shoulder area.

## 2018-12-26 NOTE — ED Provider Notes (Signed)
[facility] Emergency Department Provider Note  ____________________________________________   First MD Initiated Contact with Patient 12/26/18 2306     (approximate)  I have reviewed the triage vital signs and the nursing notes.  History  Chief Complaint Chest Pain    HPI Diamond Sutton is a 33 y.o. female currently pregnant at ~17 weeks who presents to the ED for an episode of chest pain.  Patient states around noon today she had an episode of central chest discomfort.  Describes it as feeling like heartburn.  She ate lunch, and later in the day she "started feeling funny" and had recurrence of chest discomfort, described as a pinching sensation, central.  No radiation of the pain, but states her left arm felt tingly.  No shortness of breath, nausea, diaphoresis.  No alleviating or aggravating factors.  She does have a history of hypertension and tobacco use.  No history of CAD or VTE.  At present she denies any chest pain.   Past Medical Hx Past Medical History:  Diagnosis Date  . Anemia   . Depression 2001  . Ectopic pregnancy   . History of urinary tract infection   . Hypertension   . Ovarian cyst     Problem List Patient Active Problem List   Diagnosis Date Noted  . Gestational diabetes 11/20/2016  . Itching 11/17/2016  . Back pain affecting pregnancy in third trimester 10/05/2016  . 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 DILATION AND CURETTAGE OF UTERUS     SAB    Medications Prior to Admission medications   Medication Sig Start Date End Date Taking? Authorizing Provider  aspirin 81 MG EC tablet Take 1 tablet (81 mg total) by mouth daily. Swallow whole. 12/02/18   Doreene Burke, CNM  Doxylamine-Pyridoxine ER (BONJESTA) 20-20 MG TBCR Take 20 mg by mouth as directed. Patient not taking: Reported on 12/02/2018 10/31/18   Doreene Burke, CNM  ondansetron (ZOFRAN) 4  MG tablet Take 1 tablet (4 mg total) by mouth every 8 (eight) hours as needed for nausea or vomiting. Patient not taking: Reported on 12/02/2018 10/31/18   Doreene Burke, CNM  Prenatal Vit-Fe Fumarate-FA (PRENATAL VITAMIN) 27-0.8 MG TABS Take 1 tablet by mouth daily at 6 (six) AM. 10/03/18   Federico Flake, MD    Allergies Penicillins  Family Hx Family History  Problem Relation Age of Onset  . Diabetes Mother   . Hypertension Mother   . Heart murmur Half-Brother   . Breast cancer Neg Hx   . Ovarian cancer Neg Hx   . Colon cancer Neg Hx   . Heart disease Neg Hx     Social Hx Social History   Tobacco Use  . Smoking status: Former Smoker    Packs/day: 0.25    Years: 15.00    Pack years: 3.75    Types: Cigarettes  . Smokeless tobacco: Never Used  Substance Use Topics  . Alcohol use: Not Currently    Comment: Last ETOH use 10/02/2018 - counseled to not  use  . Drug use: Not Currently     Review of Systems  Constitutional: Negative for fever, chills. Eyes: Negative for visual changes. ENT: Negative for sore throat. Cardiovascular: + for chest pain. Respiratory: Negative for shortness of breath. Gastrointestinal: Negative for nausea, vomiting.  Genitourinary: Negative for dysuria. Musculoskeletal: Negative for leg swelling. Skin: Negative for rash. Neurological: Negative for for headaches.  Physical Exam  Vital Signs: ED Triage Vitals [12/26/18 2019]  Enc Vitals Group     BP (!) 147/92     Pulse Rate (!) 105     Resp 20     Temp 98.8 F (37.1 C)     Temp Source Oral     SpO2 100 %     Weight      Height      Head Circumference      Peak Flow      Pain Score      Pain Loc      Pain Edu?      Excl. in [place]?     Constitutional: Alert and oriented.  Head: Normocephalic. Atraumatic. Eyes: Conjunctivae clear. Sclera anicteric. Nose: No congestion. No rhinorrhea. Mouth/Throat: Wearing mask.  Neck: No stridor.   Cardiovascular: Normal rate,  regular rhythm. Extremities well perfused. Respiratory: Normal respiratory effort.  Lungs CTAB. Gastrointestinal: Soft. Non-tender. Gravid. FHTS ~130s.  Musculoskeletal: No lower extremity edema. No deformities. Neurologic:  Normal speech and language. No gross focal neurologic deficits are appreciated.  Skin: Skin is warm, dry and intact. No rash noted. Psychiatric: Mood and affect are appropriate for situation.  EKG  Personally reviewed.   Rate: 94 Rhythm: NSR Axis: normal Intervals: WNL No acute ischemic changes No STEMI    Radiology  CXR: IMPRESSION:  No active cardiopulmonary disease.    Procedures  Procedure(s) performed (including critical care):  Procedures   Initial Impression / Assessment and Plan / ED Course  33 y.o. female who presents to the ED for chest pain, as above.  Currently about [redacted] weeks pregnant.  Ddx: ACS, GERD, MSK.  Patient is pregnant, however doubt PE at this time given no shortness of breath, hypoxia, tachypnea.  Will obtain labs, EKG, imaging.  EKG without acute ischemic changes. Troponin x 2 negative.  Mild, nonspecific leukocytosis on CBC, but this appears consistent with prior.  XR negative.  Given negative work-up, feel patient is stable for discharge with further outpatient follow-up.  Advised over-the-counter reflux medication, follow-up and discuss with her OB.  She voices understanding and is comfortable plan and discharge.   Final Clinical Impression(s) / ED Diagnosis  Final diagnoses:  Chest pain in adult       Note:  This document was prepared using Dragon voice recognition software and may include unintentional dictation errors.   Lilia Pro., MD 12/27/18 0000

## 2018-12-26 NOTE — ED Triage Notes (Signed)
Pt arrived via EMS from home with c/o left sided chest pain as well as left arm numbness. Symptoms started approx. 1930. Pt is [redacted] week pregnant, high risk, 10th pregnancy. Pt being treated for hypertension and gestational DM. Pt denies SOB.

## 2018-12-27 LAB — URINALYSIS, COMPLETE (UACMP) WITH MICROSCOPIC
Bacteria, UA: NONE SEEN
Bilirubin Urine: NEGATIVE
Glucose, UA: NEGATIVE mg/dL
Hgb urine dipstick: NEGATIVE
Ketones, ur: NEGATIVE mg/dL
Leukocytes,Ua: NEGATIVE
Nitrite: NEGATIVE
Protein, ur: NEGATIVE mg/dL
Specific Gravity, Urine: 1.012 (ref 1.005–1.030)
Squamous Epithelial / HPF: NONE SEEN (ref 0–5)
pH: 6 (ref 5.0–8.0)

## 2018-12-27 NOTE — Discharge Instructions (Signed)
Thank you for letting us take care of you in the emergency department today.   Please continue to take any regular, prescribed medications. You can try taking an over the counter heartburn medication to see if this helps with your symptoms, such as Pepcid - take as directed on the box. Discuss this with your OB doctor.  Please follow up with: - Your OB/GYN doctor to review your ER visit and follow up on your symptoms.   Please return to the ER for any new or worsening symptoms.

## 2018-12-28 DIAGNOSIS — Z5181 Encounter for therapeutic drug level monitoring: Secondary | ICD-10-CM | POA: Diagnosis not present

## 2019-01-03 ENCOUNTER — Encounter: Payer: Medicaid Other | Admitting: Certified Nurse Midwife

## 2019-01-03 ENCOUNTER — Other Ambulatory Visit: Payer: Medicaid Other

## 2019-01-04 DIAGNOSIS — Z5181 Encounter for therapeutic drug level monitoring: Secondary | ICD-10-CM | POA: Diagnosis not present

## 2019-01-10 ENCOUNTER — Telehealth: Payer: Self-pay

## 2019-01-10 NOTE — Telephone Encounter (Signed)
mychart message sent

## 2019-01-10 NOTE — Telephone Encounter (Signed)
Patient has cramped for 48hrs. No bleeding but grayish discharge. Please call patient

## 2019-01-17 ENCOUNTER — Ambulatory Visit (INDEPENDENT_AMBULATORY_CARE_PROVIDER_SITE_OTHER): Payer: Medicaid Other

## 2019-01-17 ENCOUNTER — Other Ambulatory Visit (HOSPITAL_COMMUNITY)
Admission: RE | Admit: 2019-01-17 | Discharge: 2019-01-17 | Disposition: A | Discharge status: Home / Self Care | Payer: Medicaid Other | Source: Ambulatory Visit | Attending: Certified Nurse Midwife | Admitting: Certified Nurse Midwife

## 2019-01-17 ENCOUNTER — Encounter: Payer: Self-pay | Admitting: Certified Nurse Midwife

## 2019-01-17 ENCOUNTER — Other Ambulatory Visit: Payer: Self-pay

## 2019-01-17 ENCOUNTER — Ambulatory Visit (INDEPENDENT_AMBULATORY_CARE_PROVIDER_SITE_OTHER): Payer: Medicaid Other | Admitting: Certified Nurse Midwife

## 2019-01-17 VITALS — BP 107/66 | HR 84 | Wt 164.1 lb

## 2019-01-17 DIAGNOSIS — Z3482 Encounter for supervision of other normal pregnancy, second trimester: Secondary | ICD-10-CM

## 2019-01-17 DIAGNOSIS — Z363 Encounter for antenatal screening for malformations: Secondary | ICD-10-CM | POA: Diagnosis not present

## 2019-01-17 DIAGNOSIS — R102 Pelvic and perineal pain: Secondary | ICD-10-CM | POA: Insufficient documentation

## 2019-01-17 LAB — POCT URINALYSIS DIPSTICK OB
Bilirubin, UA: NEGATIVE
Blood, UA: NEGATIVE
Glucose, UA: NEGATIVE
Ketones, UA: NEGATIVE
Leukocytes, UA: NEGATIVE
Nitrite, UA: NEGATIVE
POC,PROTEIN,UA: NEGATIVE
Spec Grav, UA: 1.015 (ref 1.010–1.025)
Urobilinogen, UA: 0.2 E.U./dL
pH, UA: 5 (ref 5.0–8.0)

## 2019-01-17 NOTE — Addendum Note (Signed)
Addended by: Raliegh Ip on: 01/17/2019 03:11 PM   Modules accepted: Orders

## 2019-01-17 NOTE — Progress Notes (Signed)
ROB doing well. Anatomy u/s today. See below, results reviewed. Pt concerned that she has been with baby daddy and did not use condom would like test for STD. Self swab collected. Will follow up with results. Discussed round ligament pain and self help measures. Also encouraged safe sex practices. Follow up 2 wks for u/s completion and 4 wks for ROB  Philip Aspen, CNM Patient Name: Diamond Sutton DOB: 1985/08/18 MRN: [id number] ULTRASOUND REPORT  Location: [facility] Date of Service: 01/17/2019   Indications:Anatomy Ultrasound Findings:  Diamond Sutton intrauterine pregnancy is visualized with FHR at 152 BPM. Biometrics give an (U/S) Gestational age of [redacted]w[redacted]d and an (U/S) EDD of 06/07/2019; this correlates with the clinically established Estimated Date of Delivery: 06/01/19  Fetal presentation is Cephalic.  EFW: 329 g ( 12 oz). Fetal Percentile 32 %  Placenta: posterior. Grade: 1 AFI: subjectively normal.  Anatomic survey is incomplete for face/ Nose/Lip/profile and normal; Gender - female.    Right Ovary is normal in appearance. Left Ovary is normal appearance. Survey of the adnexa demonstrates no adnexal masses. There is no free peritoneal fluid in the cul de sac.  Impression: 1. [redacted]w[redacted]d Viable Singleton Intrauterine pregnancy by U/S. 2. (U/S) EDD is consistent with Clinically established Estimated Date of Delivery: 06/01/19 . 3. Normal Anatomy Scan 4.Limited for  face/ Nose/Lip/profile  Recommendations: 1.Clinical correlation with the patient's History and Physical Exam.   Diamond Sutton    RDMS

## 2019-01-17 NOTE — Patient Instructions (Signed)

## 2019-01-19 LAB — CERVICOVAGINAL ANCILLARY ONLY
Bacterial Vaginitis (gardnerella): NEGATIVE
Candida Glabrata: NEGATIVE
Candida Vaginitis: NEGATIVE
Chlamydia: NEGATIVE
Comment: NEGATIVE
Comment: NEGATIVE
Comment: NEGATIVE
Comment: NEGATIVE
Comment: NEGATIVE
Comment: NORMAL
Neisseria Gonorrhea: NEGATIVE
Trichomonas: NEGATIVE

## 2019-01-20 NOTE — L&D Delivery Note (Signed)
Delivery Note  1753 In room to see patient due to report of complete cervical dilation. Dr Valentino Saxon at bedside complete ultrasound for fetal presentation. Vertex verified.   Spontaneous vaginal birth of liveborn female infant at 51 in direct occiput posterior position over intact perineum. Infant immediately to maternal abdomen. Delayed cord cord clamping, three (3) vessel cord, skin to skin, and tube of cord blood collected. APGARs: 8, 9. Weight pending. Receiving nurse present at bedside for birth.   Pitocin bolus infusing. Spontaneous delivery of intact placenta 1804. Uterus firm. Vault check completed under epidural anesthesia. QBL: 150 ml. Rubra small. 800 mcg cytotec placed rectal due to grand multiparity.   Initiate routine postpartum care and orders. Mom to postpartum.  Baby to Couplet care / Skin to Skin.  Dr. Valentino Saxon notified of patient's desire for bilateral tubal ligation. Consent printed and placed in chart.    Gunnar Bulla, CNM [facility] 05/08/2019, 6:45 PM

## 2019-01-27 ENCOUNTER — Other Ambulatory Visit: Payer: Medicaid Other

## 2019-01-30 ENCOUNTER — Telehealth: Payer: Self-pay

## 2019-01-30 DIAGNOSIS — R7309 Other abnormal glucose: Secondary | ICD-10-CM

## 2019-01-30 NOTE — Telephone Encounter (Signed)
Mychart message sent to patient- she is scheduled for a 3 hour glucose test before her U/S 01/31/19.

## 2019-01-31 ENCOUNTER — Ambulatory Visit (INDEPENDENT_AMBULATORY_CARE_PROVIDER_SITE_OTHER): Payer: Medicaid Other

## 2019-01-31 ENCOUNTER — Other Ambulatory Visit: Payer: Self-pay | Admitting: Certified Nurse Midwife

## 2019-01-31 ENCOUNTER — Other Ambulatory Visit: Payer: Self-pay

## 2019-01-31 DIAGNOSIS — Z3A22 22 weeks gestation of pregnancy: Secondary | ICD-10-CM

## 2019-01-31 DIAGNOSIS — Z362 Encounter for other antenatal screening follow-up: Secondary | ICD-10-CM | POA: Diagnosis not present

## 2019-01-31 DIAGNOSIS — Z5181 Encounter for therapeutic drug level monitoring: Secondary | ICD-10-CM | POA: Diagnosis not present

## 2019-02-02 ENCOUNTER — Other Ambulatory Visit: Payer: Self-pay

## 2019-02-13 ENCOUNTER — Other Ambulatory Visit: Payer: Medicaid Other

## 2019-02-14 ENCOUNTER — Ambulatory Visit (INDEPENDENT_AMBULATORY_CARE_PROVIDER_SITE_OTHER): Payer: Medicaid Other | Admitting: Certified Nurse Midwife

## 2019-02-14 ENCOUNTER — Other Ambulatory Visit: Payer: Self-pay

## 2019-02-14 ENCOUNTER — Other Ambulatory Visit (HOSPITAL_COMMUNITY)
Admission: RE | Admit: 2019-02-14 | Discharge: 2019-02-14 | Disposition: A | Discharge status: Home / Self Care | Payer: Medicaid Other | Source: Ambulatory Visit | Attending: Certified Nurse Midwife | Admitting: Certified Nurse Midwife

## 2019-02-14 VITALS — BP 108/61 | HR 96 | Wt 170.4 lb

## 2019-02-14 DIAGNOSIS — Z202 Contact with and (suspected) exposure to infections with a predominantly sexual mode of transmission: Secondary | ICD-10-CM

## 2019-02-14 DIAGNOSIS — Z3482 Encounter for supervision of other normal pregnancy, second trimester: Secondary | ICD-10-CM

## 2019-02-14 DIAGNOSIS — Z3A24 24 weeks gestation of pregnancy: Secondary | ICD-10-CM

## 2019-02-14 LAB — POCT URINALYSIS DIPSTICK OB
Bilirubin, UA: NEGATIVE
Blood, UA: NEGATIVE
Glucose, UA: NEGATIVE
Ketones, UA: NEGATIVE
Leukocytes, UA: NEGATIVE
Nitrite, UA: NEGATIVE
POC,PROTEIN,UA: NEGATIVE
Spec Grav, UA: 1.01 (ref 1.010–1.025)
Urobilinogen, UA: 0.2 E.U./dL
pH, UA: 5 (ref 5.0–8.0)

## 2019-02-14 LAB — OB RESULTS CONSOLE GC/CHLAMYDIA: Gonorrhea: NEGATIVE

## 2019-02-14 NOTE — Progress Notes (Signed)
ROB doing well. States she was supposed to have 3 hr done yesterday but forgot she had to be with her kids so she was not able to come. Discussed doing 3 hr glucose screen and importance. Explained that we repeat at 28 wks, given she is 28 wks  at next appointment whe will complete at that time. She request std testing stating she is concerned about exposure . Occurred 3 wks ago. Labs collected. Will follow up with results. ROB 4 wks.   Doreene Burke, CNM

## 2019-02-14 NOTE — Patient Instructions (Signed)
Gestational Diabetes Mellitus, Diagnosis Gestational diabetes (gestational diabetes mellitus) is a short-term (temporary) form of diabetes that can happen during pregnancy. It goes away after you give birth. It may be caused by one or both of these problems:  Your pancreas does not make enough of a hormone called insulin.  Your body does not respond in a normal way to insulin that it makes. Insulin lets sugars (glucose) go into cells in the body. This gives you energy. If you have diabetes, sugars cannot get into cells. This causes high blood sugar (hyperglycemia). If you get gestational diabetes, you are:  More likely to get it if you get pregnant again.  More likely to develop type 2 diabetes in the future. If gestational diabetes is treated, it may not hurt you or your baby. Your doctor will set treatment goals for you. In general, you should have these blood sugar levels:  After not eating for a long time (fasting): 95 mg/dL (5.3 mmol/L).  After meals (postprandial): ? One hour after a meal: at or below 140 mg/dL (7.8 mmol/L). ? Two hours after a meal: at or below 120 mg/dL (6.7 mmol/L).  A1c (hemoglobin A1c) level: 6-6.5%. Follow these instructions at home: Questions to ask your doctor   You may want to ask these questions: ? Do I need to meet with a diabetes educator? ? What equipment will I need to care for myself at home? ? What medicines do I need? When should I take them? ? How often do I need to check my blood sugar? ? What number can I call if I have questions? ? When is my next doctor's visit? General instructions  Take over-the-counter and prescription medicines only as told by your doctor.  Stay at a healthy weight during pregnancy.  Keep all follow-up visits as told by your doctor. This is important. Contact a doctor if:  Your blood sugar is at or above 240 mg/dL (13.3 mmol/L).  Your blood sugar is at or above 200 mg/dL (11.1 mmol/L) and you have ketones in  your pee (urine).  You have been sick or have had a fever for 2 days or more and you are not getting better.  You have any of these problems for more than 6 hours: ? You cannot eat or drink. ? You feel sick to your stomach (nauseous). ? You throw up (vomit). ? You have watery poop (diarrhea). Get help right away if:  Your blood sugar is lower than 54 mg/dL (3 mmol/L).  You get confused.  You have trouble: ? Thinking clearly. ? Breathing.  Your baby moves less than normal.  You have any of these: ? Moderate or large ketone levels in your pee. ? Blood coming from your vagina. ? Unusual fluid coming from your vagina. ? Early contractions. These may feel like tightness in your belly. Summary  Gestational diabetes is a short-term form of diabetes. It can happen while you are pregnant. It goes away after you give birth.  If gestational diabetes is treated, it may not hurt you or your baby. Your doctor will set treatment goals for you.  Keep all follow-up visits as told by your doctor. This is important. This information is not intended to replace advice given to you by your health care provider. Make sure you discuss any questions you have with your health care provider. Document Revised: 02/11/2017 Document Reviewed: 02/08/2015 Elsevier Patient Education  2020 Elsevier Inc.  

## 2019-02-15 LAB — HSV 1 AND 2 IGM ABS, INDIRECT
HSV 1 IgM: <1:10 {titer}
HSV 2 IgM: <1:10 {titer}

## 2019-02-15 LAB — HEPATITIS B SURFACE ANTIGEN: Hepatitis B Surface Ag: NEGATIVE

## 2019-02-15 LAB — RPR: RPR Ser Ql: NONREACTIVE

## 2019-02-15 LAB — HIV ANTIBODY (ROUTINE TESTING W REFLEX): HIV Screen 4th Generation wRfx: NONREACTIVE

## 2019-02-16 LAB — CERVICOVAGINAL ANCILLARY ONLY
Bacterial Vaginitis (gardnerella): NEGATIVE
Candida Glabrata: NEGATIVE
Candida Vaginitis: NEGATIVE
Chlamydia: NEGATIVE
Comment: NEGATIVE
Comment: NEGATIVE
Comment: NEGATIVE
Comment: NEGATIVE
Comment: NEGATIVE
Comment: NORMAL
Neisseria Gonorrhea: NEGATIVE
Trichomonas: NEGATIVE

## 2019-03-14 ENCOUNTER — Other Ambulatory Visit: Payer: Medicaid Other

## 2019-03-14 ENCOUNTER — Ambulatory Visit (INDEPENDENT_AMBULATORY_CARE_PROVIDER_SITE_OTHER): Payer: Medicaid Other | Admitting: Certified Nurse Midwife

## 2019-03-14 ENCOUNTER — Other Ambulatory Visit: Payer: Self-pay

## 2019-03-14 ENCOUNTER — Encounter: Payer: Medicaid Other | Admitting: Certified Nurse Midwife

## 2019-03-14 ENCOUNTER — Other Ambulatory Visit (HOSPITAL_COMMUNITY)
Admission: RE | Admit: 2019-03-14 | Discharge: 2019-03-14 | Disposition: A | Discharge status: Home / Self Care | Payer: Medicaid Other | Source: Ambulatory Visit | Attending: Certified Nurse Midwife | Admitting: Certified Nurse Midwife

## 2019-03-14 VITALS — BP 105/60 | HR 103 | Wt 174.0 lb

## 2019-03-14 DIAGNOSIS — R109 Unspecified abdominal pain: Secondary | ICD-10-CM

## 2019-03-14 DIAGNOSIS — Z202 Contact with and (suspected) exposure to infections with a predominantly sexual mode of transmission: Secondary | ICD-10-CM

## 2019-03-14 DIAGNOSIS — Z3493 Encounter for supervision of normal pregnancy, unspecified, third trimester: Secondary | ICD-10-CM | POA: Diagnosis not present

## 2019-03-14 DIAGNOSIS — N898 Other specified noninflammatory disorders of vagina: Secondary | ICD-10-CM

## 2019-03-14 DIAGNOSIS — Z113 Encounter for screening for infections with a predominantly sexual mode of transmission: Secondary | ICD-10-CM | POA: Diagnosis not present

## 2019-03-14 DIAGNOSIS — R102 Pelvic and perineal pain: Secondary | ICD-10-CM | POA: Diagnosis not present

## 2019-03-14 LAB — POCT URINALYSIS DIPSTICK OB
Bilirubin, UA: NEGATIVE
Blood, UA: NEGATIVE
Glucose, UA: NEGATIVE
Nitrite, UA: NEGATIVE
Spec Grav, UA: 1.02 (ref 1.010–1.025)
Urobilinogen, UA: 0.2 E.U./dL
pH, UA: 6.5 (ref 5.0–8.0)

## 2019-03-14 LAB — OB RESULTS CONSOLE GC/CHLAMYDIA: Gonorrhea: NEGATIVE

## 2019-03-14 NOTE — Addendum Note (Signed)
Addended by: Mechele Claude on: 03/14/2019 02:48 PM   Modules accepted: Orders

## 2019-03-14 NOTE — Progress Notes (Signed)
PT present for problem visit she complains of increased vaginal discharge . She is concerned about STD. Pt has had this concern in the past. She has been counseld on safe sex with use of condoms. She state the condom broke. Swab collected. She also complains of round ligament pain. Self help measures reviewed. Will follow up with results. Return as scheduled with Marcelino Duster for ROB in 3/2.  Doreene Burke, CNM

## 2019-03-14 NOTE — Progress Notes (Signed)
OB-Patient c/o thick white/grayish vaginal discharge, vaginal irritation, constant abdominal and vaginal pain x1 week.

## 2019-03-14 NOTE — Patient Instructions (Signed)

## 2019-03-15 DIAGNOSIS — Z5181 Encounter for therapeutic drug level monitoring: Secondary | ICD-10-CM | POA: Diagnosis not present

## 2019-03-16 LAB — CERVICOVAGINAL ANCILLARY ONLY
Bacterial Vaginitis (gardnerella): NEGATIVE
Candida Glabrata: NEGATIVE
Candida Vaginitis: NEGATIVE
Chlamydia: NEGATIVE
Comment: NEGATIVE
Comment: NEGATIVE
Comment: NEGATIVE
Comment: NEGATIVE
Comment: NEGATIVE
Comment: NORMAL
Neisseria Gonorrhea: NEGATIVE
Trichomonas: NEGATIVE

## 2019-03-21 ENCOUNTER — Other Ambulatory Visit: Payer: Medicaid Other

## 2019-03-21 ENCOUNTER — Other Ambulatory Visit: Payer: Self-pay

## 2019-03-21 ENCOUNTER — Encounter: Payer: Self-pay | Admitting: Certified Nurse Midwife

## 2019-03-21 ENCOUNTER — Ambulatory Visit (INDEPENDENT_AMBULATORY_CARE_PROVIDER_SITE_OTHER): Payer: Medicaid Other | Admitting: Certified Nurse Midwife

## 2019-03-21 VITALS — BP 113/59 | HR 96 | Wt 174.1 lb

## 2019-03-21 DIAGNOSIS — Z3493 Encounter for supervision of normal pregnancy, unspecified, third trimester: Secondary | ICD-10-CM

## 2019-03-21 DIAGNOSIS — Z3482 Encounter for supervision of other normal pregnancy, second trimester: Secondary | ICD-10-CM | POA: Diagnosis not present

## 2019-03-21 LAB — POCT URINALYSIS DIPSTICK OB
Bilirubin, UA: NEGATIVE
Blood, UA: NEGATIVE
Ketones, UA: NEGATIVE
Leukocytes, UA: NEGATIVE
Nitrite, UA: NEGATIVE
POC,PROTEIN,UA: NEGATIVE
Spec Grav, UA: 1.025 (ref 1.010–1.025)
Urobilinogen, UA: 0.2 E.U./dL
pH, UA: 5 (ref 5.0–8.0)

## 2019-03-21 MED ORDER — TETANUS-DIPHTH-ACELL PERTUSSIS 5-2.5-18.5 LF-MCG/0.5 IM SUSP
0.5000 mL | Freq: Once | INTRAMUSCULAR | Status: AC
  Administered 2019-03-21: 0.5 mL via INTRAMUSCULAR

## 2019-03-21 NOTE — Patient Instructions (Signed)
Td (Tetanus, Diphtheria) Vaccine: What You Need to Know 1. Why get vaccinated? Td vaccine can prevent tetanus and diphtheria. Tetanus enters the body through cuts or wounds. Diphtheria spreads from person to person.  TETANUS (T) causes painful stiffening of the muscles. Tetanus can lead to serious health problems, including being unable to open the mouth, having trouble swallowing and breathing, or death.  DIPHTHERIA (D) can lead to difficulty breathing, heart failure, paralysis, or death. 2. Td vaccine Td is only for children 7 years and older, adolescents, and adults.  Td is usually given as a booster dose every 10 years, but it can also be given earlier after a severe and dirty wound or burn. Another vaccine, called Tdap, that protects against pertussis, also known as "whooping cough," in addition to tetanus and diphtheria, may be used instead of Td.  Td may be given at the same time as other vaccines. 3. Talk with your health care provider Tell your vaccine provider if the person getting the vaccine:  Has had an allergic reaction after a previous dose of any vaccine that protects against tetanus or diphtheria, or has any severe, life-threatening allergies.  Has ever had Guillain-Barr Syndrome (also called GBS).  Has had severe pain or swelling after a previous dose of any vaccine that protects against tetanus or diphtheria. In some cases, your health care provider may decide to postpone Td vaccination to a future visit.  People with minor illnesses, such as a cold, may be vaccinated. People who are moderately or severely ill should usually wait until they recover before getting Td vaccine.  Your health care provider can give you more information. 4. Risks of a vaccine reaction  Pain, redness, or swelling where the shot was given, mild fever, headache, feeling tired, and nausea, vomiting, diarrhea, or stomachache sometimes happen after Td vaccine. People sometimes faint after medical  procedures, including vaccination. Tell your provider if you feel dizzy or have vision changes or ringing in the ears.  As with any medicine, there is a very remote chance of a vaccine causing a severe allergic reaction, other serious injury, or death. 5. What if there is a serious problem? An allergic reaction could occur after the vaccinated person leaves the clinic. If you see signs of a severe allergic reaction (hives, swelling of the face and throat, difficulty breathing, a fast heartbeat, dizziness, or weakness), call 9-1-1 and get the person to the nearest hospital.  For other signs that concern you, call your health care provider.  Adverse reactions should be reported to the Vaccine Adverse Event Reporting System (VAERS). Your health care provider will usually file this report, or you can do it yourself. Visit the VAERS website at www.vaers.hhs.gov or call 1-800-822-7967. VAERS is only for reporting reactions, and VAERS staff do not give medical advice. 6. The National Vaccine Injury Compensation Program The National Vaccine Injury Compensation Program (VICP) is a federal program that was created to compensate people who may have been injured by certain vaccines. Visit the VICP website at www.hrsa.gov/vaccinecompensation or call 1-800-338-2382 to learn about the program and about filing a claim. There is a time limit to file a claim for compensation. 7. How can I learn more?  Ask your health care provider.  Call your local or state health department.  Contact the Centers for Disease Control and Prevention (CDC): ? Call 1-800-232-4636 (1-800-CDC-INFO) or ? Visit CDC's website at www.cdc.gov/vaccines Vaccine Information Statement Td Vaccine (04/20/18) This information is not intended to replace advice given   to you by your health care provider. Make sure you discuss any questions you have with your health care provider. Document Revised: 05/30/2018 Document Reviewed: 05/02/2018 Elsevier  Patient Education  2020 Elsevier Inc. Glucose Tolerance Test During Pregnancy Why am I having this test? The glucose tolerance test (GTT) is done to check how your body processes sugar (glucose). This is one of several tests used to diagnose diabetes that develops during pregnancy (gestational diabetes mellitus). Gestational diabetes is a temporary form of diabetes that some women develop during pregnancy. It usually occurs during the second trimester of pregnancy and goes away after delivery. Testing (screening) for gestational diabetes usually occurs between 24 and 28 weeks of pregnancy. You may have the GTT test after having a 1-hour glucose screening test if the results from that test indicate that you may have gestational diabetes. You may also have this test if:  You have a history of gestational diabetes.  You have a history of giving birth to very large babies or have experienced repeated fetal loss (stillbirth).  You have signs and symptoms of diabetes, such as: ? Changes in your vision. ? Tingling or numbness in your hands or feet. ? Changes in hunger, thirst, and urination that are not otherwise explained by your pregnancy. What is being tested? This test measures the amount of glucose in your blood at different times during a period of 3 hours. This indicates how well your body is able to process glucose. What kind of sample is taken?  Blood samples are required for this test. They are usually collected by inserting a needle into a blood vessel. How do I prepare for this test?  For 3 days before your test, eat normally. Have plenty of carbohydrate-rich foods.  Follow instructions from your health care provider about: ? Eating or drinking restrictions on the day of the test. You may be asked to not eat or drink anything other than water (fast) starting 8-10 hours before the test. ? Changing or stopping your regular medicines. Some medicines may interfere with this test. Tell a  health care provider about:  All medicines you are taking, including vitamins, herbs, eye drops, creams, and over-the-counter medicines.  Any blood disorders you have.  Any surgeries you have had.  Any medical conditions you have. What happens during the test? First, your blood glucose will be measured. This is referred to as your fasting blood glucose, since you fasted before the test. Then, you will drink a glucose solution that contains a certain amount of glucose. Your blood glucose will be measured again 1, 2, and 3 hours after drinking the solution. This test takes about 3 hours to complete. You will need to stay at the testing location during this time. During the testing period:  Do not eat or drink anything other than the glucose solution.  Do not exercise.  Do not use any products that contain nicotine or tobacco, such as cigarettes and e-cigarettes. If you need help stopping, ask your health care provider. The testing procedure may vary among health care providers and hospitals. How are the results reported? Your results will be reported as milligrams of glucose per deciliter of blood (mg/dL) or millimoles per liter (mmol/L). Your health care provider will compare your results to normal ranges that were established after testing a large group of people (reference ranges). Reference ranges may vary among labs and hospitals. For this test, common reference ranges are:  Fasting: less than 95-105 mg/dL (5.3-5.8 mmol/L).  1 hour   after drinking glucose: less than 180-190 mg/dL (10.0-10.5 mmol/L).  2 hours after drinking glucose: less than 155-165 mg/dL (8.6-9.2 mmol/L).  3 hours after drinking glucose: 140-145 mg/dL (7.8-8.1 mmol/L). What do the results mean? Results within reference ranges are considered normal, meaning that your glucose levels are well-controlled. If two or more of your blood glucose levels are high, you may be diagnosed with gestational diabetes. If only one  level is high, your health care provider may suggest repeat testing or other tests to confirm a diagnosis. Talk with your health care provider about what your results mean. Questions to ask your health care provider Ask your health care provider, or the department that is doing the test:  When will my results be ready?  How will I get my results?  What are my treatment options?  What other tests do I need?  What are my next steps? Summary  The glucose tolerance test (GTT) is one of several tests used to diagnose diabetes that develops during pregnancy (gestational diabetes mellitus). Gestational diabetes is a temporary form of diabetes that some women develop during pregnancy.  You may have the GTT test after having a 1-hour glucose screening test if the results from that test indicate that you may have gestational diabetes. You may also have this test if you have any symptoms or risk factors for gestational diabetes.  Talk with your health care provider about what your results mean. This information is not intended to replace advice given to you by your health care provider. Make sure you discuss any questions you have with your health care provider. Document Revised: 04/28/2018 Document Reviewed: 08/17/2016 Elsevier Patient Education  2020 Elsevier Inc.  

## 2019-03-21 NOTE — Progress Notes (Signed)
ROB doing well. 3 hr glucose today/CBC/RPR/Tdap/BTC completed. Discussed birth control after baby. Pt request tubal. Consent signed today. Discussed birth plan, sample given. Will review at next appointment. Information given on breast pump. Will follow up with lab results. ROB in 2 wks with Marcelino Duster

## 2019-03-22 ENCOUNTER — Other Ambulatory Visit: Payer: Self-pay | Admitting: Certified Nurse Midwife

## 2019-03-22 DIAGNOSIS — O24419 Gestational diabetes mellitus in pregnancy, unspecified control: Secondary | ICD-10-CM

## 2019-03-22 LAB — GESTATIONAL GLUCOSE TOLERANCE
Glucose, Fasting: 98 mg/dL — ABNORMAL HIGH (ref 65–94)
Glucose, GTT - 1 Hour: 192 mg/dL — ABNORMAL HIGH (ref 65–179)
Glucose, GTT - 2 Hour: 190 mg/dL — ABNORMAL HIGH (ref 65–154)
Glucose, GTT - 3 Hour: 163 mg/dL — ABNORMAL HIGH (ref 65–139)

## 2019-03-22 NOTE — Progress Notes (Signed)
Orders placed for lifestyles GDM .  Doreene Burke, CNM

## 2019-03-28 DIAGNOSIS — Z5181 Encounter for therapeutic drug level monitoring: Secondary | ICD-10-CM | POA: Diagnosis not present

## 2019-03-29 ENCOUNTER — Encounter: Payer: Medicaid Other | Attending: Certified Nurse Midwife | Admitting: Dietician

## 2019-03-29 ENCOUNTER — Other Ambulatory Visit: Payer: Self-pay

## 2019-03-29 ENCOUNTER — Encounter: Payer: Self-pay | Admitting: Dietician

## 2019-03-29 VITALS — BP 126/70 | Ht 66.0 in | Wt 177.5 lb

## 2019-03-29 DIAGNOSIS — O24419 Gestational diabetes mellitus in pregnancy, unspecified control: Secondary | ICD-10-CM | POA: Diagnosis not present

## 2019-03-29 DIAGNOSIS — Z713 Dietary counseling and surveillance: Secondary | ICD-10-CM | POA: Diagnosis not present

## 2019-03-29 DIAGNOSIS — Z3A00 Weeks of gestation of pregnancy not specified: Secondary | ICD-10-CM | POA: Insufficient documentation

## 2019-03-29 DIAGNOSIS — O09529 Supervision of elderly multigravida, unspecified trimester: Secondary | ICD-10-CM | POA: Insufficient documentation

## 2019-03-29 DIAGNOSIS — O24410 Gestational diabetes mellitus in pregnancy, diet controlled: Secondary | ICD-10-CM

## 2019-03-29 NOTE — Progress Notes (Signed)
Diabetes Self-Management Education  Visit Type: First/Initial  Appt. Start Time: 1615 Appt. End Time: 7902  03/30/2019  [name], identified by name and date of birth, is a 34 y.o. female with a diagnosis of Diabetes: Gestational Diabetes.   ASSESSMENT  Blood pressure 126/70, height '5\' 6"'  (1.676 m), weight 177 lb 8 oz (80.5 kg), last menstrual period 08/25/2018. Body mass index is 28.65 kg/m.  Diabetes Self-Management Education - 03/29/19 1646      Visit Information   Visit Type  First/Initial      Initial Visit   Diabetes Type  Gestational Diabetes    Are you taking your medications as prescribed?  Not on Medications      Health Coping   How would you rate your overall health?  Good      Psychosocial Assessment   Patient Belief/Attitude about Diabetes  Motivated to manage diabetes    Self-care barriers  None    Patient Concerns  Glycemic Control    Special Needs  None    Preferred Learning Style  Auditory;Visual;Hands on    Learning Readiness  Ready      Pre-Education Assessment   Patient understands the diabetes disease and treatment process.  Demonstrates understanding / competency    Patient understands incorporating nutritional management into lifestyle.  Needs Review    Patient undertands incorporating physical activity into lifestyle.  Needs Review    Patient understands using medications safely.  Needs Review    Patient understands monitoring blood glucose, interpreting and using results  Needs Review    Patient understands prevention, detection, and treatment of acute complications.  Needs Review    Patient understands prevention, detection, and treatment of chronic complications.  Needs Review   not applicable at this time   Patient understands how to develop strategies to address psychosocial issues.  Needs Review    Patient understands how to develop strategies to promote health/change behavior.  Demonstrates understanding / competency       Complications   How often do you check your blood sugar?  0 times/day (not testing)    Have you had a dilated eye exam in the past 12 months?  No    Have you had a dental exam in the past 12 months?  No    Are you checking your feet?  N/A      Dietary Intake   Breakfast  eggs, grits, sausage, sometimes with a biscuit    Snack (morning)  fruit or chips; peanut butter or sour cream and chive snack crackers    Lunch  2 sandwiches and plain chips and grapes    Snack (afternoon)  same as am    Dinner  meat + vegetables + mashed potatoes (craves mashed potatoes)    Snack (evening)  same as am    Beverage(s)  water, gatorade, flavored water, rarely 1/2 of a soda      Exercise   Exercise Type  Light (walking / raking leaves)   caring for young children at home   How many days per week to you exercise?  7    How many minutes per day do you exercise?  60    Total minutes per week of exercise  420      Patient Education   Previous Diabetes Education  Yes (please comment)    Nutrition management   Role of diet in the treatment of diabetes and the relationship between the three main macronutrients and blood glucose level;Food label reading,  portion sizes and measuring food.;Reviewed blood glucose goals for pre and post meals and how to evaluate the patients' food intake on their blood glucose level.;Meal timing in regards to the patients' current diabetes medication.;Meal options for control of blood glucose level and chronic complications.;Other (comment)   food safety   Physical activity and exercise   Role of exercise on diabetes management, blood pressure control and cardiac health.    Monitoring  Taught/evaluated SMBG meter.;Purpose and frequency of SMBG.;Taught/discussed recording of test results and interpretation of SMBG.    Preconception care  Pregnancy and GDM  Role of pre-pregnancy blood glucose control on the development of the fetus;Reviewed with patient blood glucose goals with pregnancy     Personal strategies to promote health  Other (comment)   reducing risk for Type 2 diabetes     Post-Education Assessment   Patient understands the diabetes disease and treatment process.  Demonstrates understanding / competency    Patient understands incorporating nutritional management into lifestyle.  Demonstrates understanding / competency    Patient undertands incorporating physical activity into lifestyle.  Demonstrates understanding / competency    Patient understands using medications safely.  Needs Review   not applicable at this time   Patient understands monitoring blood glucose, interpreting and using results  Demonstrates understanding / competency    Patient understands prevention, detection, and treatment of acute complications.  Needs Review    Patient understands prevention, detection, and treatment of chronic complications.  Needs Review   not applicable at this time   Patient understands how to develop strategies to address psychosocial issues.  Needs Review    Patient understands how to develop strategies to promote health/change behavior.  Demonstrates understanding / competency      Outcomes   Expected Outcomes  Demonstrated interest in learning. Expect positive outcomes    Program Status  Completed       Individualized Plan for Diabetes Self-Management Training:   Learning Objective:  Patient will have a greater understanding of diabetes self-management. Patient education plan is to attend individual and/or group sessions per assessed needs and concerns.   Plan:   Patient Instructions   Read/ review booklet on Gestational Diabetes  Follow meal planning guidelines for gestational diabetes  Check blood sugars 4x a day -- before breakfast and 2 hours after each meal  Call MD for prescription for Accu Chek Guide testing strips and Fastclix lancet cartidges  Stay active with light exercise/ activity, ideally 20-30 minutes 5x a week as cleared by MD.     Expected Outcomes:  Demonstrated interest in learning. Expect positive outcomes  Education material provided:   Gestational diabetes booklet  Accu Chek Guide meter kit   General Meal Planning Guidelines for Gestational Diabetes  Plate planner with food lists  Sample menus  If problems or questions, patient to contact team via:  Phone  Future DSME appointment:  no additional appointments scheduled at this time; patient will call as needed

## 2019-03-30 NOTE — Patient Instructions (Addendum)
   Read/ review booklet on Gestational Diabetes  Follow meal planning guidelines for gestational diabetes, keep carb intake to 45grams or less with meals.  Check blood sugars 4x a day -- before breakfast and 2 hours after each meal  Call MD for prescription for Accu Chek Guide testing strips and Fastclix lancet cartidges  Stay active with light exercise/ activity, ideally 20-30 minutes 5x a week as cleared by MD.

## 2019-03-31 ENCOUNTER — Other Ambulatory Visit: Payer: Self-pay

## 2019-03-31 MED ORDER — GLUCOSE BLOOD VI STRP: ORAL_STRIP | 12 refills | Status: DC

## 2019-03-31 NOTE — Telephone Encounter (Signed)
Accuchek Guide test strips #100 with 12 refills sent to preferred pharmacy per [facility] request.

## 2019-04-05 DIAGNOSIS — Z5181 Encounter for therapeutic drug level monitoring: Secondary | ICD-10-CM | POA: Diagnosis not present

## 2019-04-07 ENCOUNTER — Ambulatory Visit (INDEPENDENT_AMBULATORY_CARE_PROVIDER_SITE_OTHER): Payer: Medicaid Other | Admitting: Certified Nurse Midwife

## 2019-04-07 ENCOUNTER — Other Ambulatory Visit: Payer: Self-pay

## 2019-04-07 VITALS — BP 104/69 | HR 74 | Wt 174.5 lb

## 2019-04-07 DIAGNOSIS — O09899 Supervision of other high risk pregnancies, unspecified trimester: Secondary | ICD-10-CM

## 2019-04-07 DIAGNOSIS — Z8619 Personal history of other infectious and parasitic diseases: Secondary | ICD-10-CM

## 2019-04-07 DIAGNOSIS — O09299 Supervision of pregnancy with other poor reproductive or obstetric history, unspecified trimester: Secondary | ICD-10-CM

## 2019-04-07 DIAGNOSIS — O24419 Gestational diabetes mellitus in pregnancy, unspecified control: Secondary | ICD-10-CM

## 2019-04-07 DIAGNOSIS — Z3A32 32 weeks gestation of pregnancy: Secondary | ICD-10-CM

## 2019-04-07 DIAGNOSIS — Z3493 Encounter for supervision of normal pregnancy, unspecified, third trimester: Secondary | ICD-10-CM

## 2019-04-07 DIAGNOSIS — Z8759 Personal history of other complications of pregnancy, childbirth and the puerperium: Secondary | ICD-10-CM

## 2019-04-07 NOTE — Progress Notes (Signed)
ROB-No complaints.  

## 2019-04-07 NOTE — Patient Instructions (Signed)
Nonstress Test °A nonstress test is a procedure that is done during pregnancy in order to check the baby's heartbeat. The procedure can help show if the baby (fetus) is healthy. It is commonly done when: °· The baby is past his or her due date. °· The pregnancy is high risk. °· The baby is moving less than normal. °· The mother has lost a pregnancy in the past. °· The health care provider suspects a problem with the baby's growth. °· There is too much or too little amniotic fluid. °The procedure is often done in the third trimester of pregnancy to find out if an early delivery is needed and whether such a delivery is safe. °During a nonstress test, the baby's heartbeat is monitored when the baby is resting and when the baby is moving. If the baby is healthy, the heart rate will increase when he or she moves or kicks and will return to normal when he or she rests. °Tell a health care provider about: °· Any allergies you have. °· Any medical conditions you have. °· All medicines you are taking, including vitamins, herbs, eye drops, creams, and over-the-counter medicines. °What are the risks? °There are no risks to you or your baby from a nonstress test. This procedure should not be painful or uncomfortable. °What happens before the procedure? °· Eat a meal right before the test or as directed by your health care provider. Food may help encourage the baby to move. °· Use the restroom right before the test. °What happens during the procedure? °· Two monitors will be placed on your abdomen. One will record the baby's heart rate and the other will record the contractions of your uterus. °· You may be asked to lie down on your side or to sit upright. °· You may be given a button to press when you feel your baby move. °· Your health care provider will listen to your baby's heartbeat and recorded it. He or she may also watch your baby's heartbeat on a screen. °· If the baby seems to be sleeping, you may be asked to drink  some juice or soda, eat a snack, or change positions. °The procedure may vary among health care providers and hospitals. °What happens after the procedure? °· Your health care provider will discuss the test results with you and make recommendations for the future. Depending on the results, your health care provider may order additional tests or another course of action. °· If your health care provider gave you any diet or activity instructions, make sure to follow them. °· Keep all follow-up visits as told by your health care provider. This is important. °Summary °· A nonstress test is a procedure that is done during pregnancy in order to check the baby's heartbeat. The procedure can help show if the baby is healthy. °· The procedure is often done in the third trimester of pregnancy to find out if an early delivery is needed and whether such a delivery is safe. °· During a nonstress test, the baby's heartbeat is monitored when the baby is resting and when the baby is moving. If the baby is healthy, the heart rate will increase when he or she moves or kicks and will return to normal when he or she rests. °· Your health care provider will discuss the test results with you and make recommendations for the future. °This information is not intended to replace advice given to you by your health care provider. Make sure you discuss any   questions you have with your health care provider. °Document Revised: 04/16/2016 Document Reviewed: 04/16/2016 °Elsevier Patient Education © 2020 [employer] ° ° ° °Fetal Movement Counts °Patient Name: ________________________________________________ Patient Due Date: ____________________ °What is a fetal movement count? ° °A fetal movement count is the number of times that you feel your baby move during a certain amount of time. This may also be called a fetal kick count. A fetal movement count is recommended for every pregnant woman. You may be asked to start counting fetal movements as  early as week 28 of your pregnancy. °Pay attention to when your baby is most active. You may notice your baby's sleep and wake cycles. You may also notice things that make your baby move more. You should do a fetal movement count: °· When your baby is normally most active. °· At the same time each day. °A good time to count movements is while you are resting, after having something to eat and drink. °How do I count fetal movements? °1. Find a quiet, comfortable area. Sit, or lie down on your side. °2. Write down the date, the start time and stop time, and the number of movements that you felt between those two times. Take this information with you to your health care visits. °3. Write down your start time when you feel the first movement. °4. Count kicks, flutters, swishes, rolls, and jabs. You should feel at least 10 movements. °5. You may stop counting after you have felt 10 movements, or if you have been counting for 2 hours. Write down the stop time. °6. If you do not feel 10 movements in 2 hours, contact your health care provider for further instructions. Your health care provider may want to do additional tests to assess your baby's well-being. °Contact a health care provider if: °· You feel fewer than 10 movements in 2 hours. °· Your baby is not moving like he or she usually does. °Date: ____________ Start time: ____________ Stop time: ____________ Movements: ____________ °Date: ____________ Start time: ____________ Stop time: ____________ Movements: ____________ °Date: ____________ Start time: ____________ Stop time: ____________ Movements: ____________ °Date: ____________ Start time: ____________ Stop time: ____________ Movements: ____________ °Date: ____________ Start time: ____________ Stop time: ____________ Movements: ____________ °Date: ____________ Start time: ____________ Stop time: ____________ Movements: ____________ °Date: ____________ Start time: ____________ Stop time: ____________ Movements:  ____________ °Date: ____________ Start time: ____________ Stop time: ____________ Movements: ____________ °Date: ____________ Start time: ____________ Stop time: ____________ Movements: ____________ °This information is not intended to replace advice given to you by your health care provider. Make sure you discuss any questions you have with your health care provider. °Document Revised: 08/25/2018 Document Reviewed: 08/25/2018 °Elsevier Patient Education © 2020 [employer] ° °

## 2019-04-09 DIAGNOSIS — O09899 Supervision of other high risk pregnancies, unspecified trimester: Secondary | ICD-10-CM | POA: Insufficient documentation

## 2019-04-09 DIAGNOSIS — O09299 Supervision of pregnancy with other poor reproductive or obstetric history, unspecified trimester: Secondary | ICD-10-CM | POA: Insufficient documentation

## 2019-04-09 NOTE — Progress Notes (Signed)
ROB-Doing well, no questions. Picking up test strips from pharmacy today. Blood sugar this morning 125, no log today. Anticipatory guidance regarding course of prenatal care. Reviewed red flag symptoms and when to call. RTC x 1 week for growth/AFI, blood sugar log review, NST and ROB or sooner if needed.

## 2019-04-11 DIAGNOSIS — Z5181 Encounter for therapeutic drug level monitoring: Secondary | ICD-10-CM | POA: Diagnosis not present

## 2019-04-13 ENCOUNTER — Other Ambulatory Visit: Payer: Self-pay

## 2019-04-13 ENCOUNTER — Ambulatory Visit (INDEPENDENT_AMBULATORY_CARE_PROVIDER_SITE_OTHER): Payer: Medicaid Other | Admitting: Certified Nurse Midwife

## 2019-04-13 ENCOUNTER — Ambulatory Visit (INDEPENDENT_AMBULATORY_CARE_PROVIDER_SITE_OTHER): Payer: Medicaid Other

## 2019-04-13 ENCOUNTER — Other Ambulatory Visit: Payer: Medicaid Other

## 2019-04-13 VITALS — BP 114/71 | HR 81 | Wt 175.0 lb

## 2019-04-13 DIAGNOSIS — I10 Essential (primary) hypertension: Secondary | ICD-10-CM

## 2019-04-13 DIAGNOSIS — Z3493 Encounter for supervision of normal pregnancy, unspecified, third trimester: Secondary | ICD-10-CM

## 2019-04-13 DIAGNOSIS — Z130 Encounter for screening for diseases of the blood and blood-forming organs and certain disorders involving the immune mechanism: Secondary | ICD-10-CM

## 2019-04-13 DIAGNOSIS — Z113 Encounter for screening for infections with a predominantly sexual mode of transmission: Secondary | ICD-10-CM | POA: Diagnosis not present

## 2019-04-13 DIAGNOSIS — Z3A32 32 weeks gestation of pregnancy: Secondary | ICD-10-CM

## 2019-04-13 DIAGNOSIS — O10013 Pre-existing essential hypertension complicating pregnancy, third trimester: Secondary | ICD-10-CM

## 2019-04-13 DIAGNOSIS — O24419 Gestational diabetes mellitus in pregnancy, unspecified control: Secondary | ICD-10-CM | POA: Diagnosis not present

## 2019-04-13 DIAGNOSIS — Z3A33 33 weeks gestation of pregnancy: Secondary | ICD-10-CM | POA: Diagnosis not present

## 2019-04-13 DIAGNOSIS — O09893 Supervision of other high risk pregnancies, third trimester: Secondary | ICD-10-CM

## 2019-04-13 LAB — POCT URINALYSIS DIPSTICK OB
Bilirubin, UA: NEGATIVE
Blood, UA: NEGATIVE
Glucose, UA: NEGATIVE
Ketones, UA: NEGATIVE
Leukocytes, UA: NEGATIVE
Nitrite, UA: NEGATIVE
POC,PROTEIN,UA: NEGATIVE
Spec Grav, UA: >=1.03 — AB (ref 1.010–1.025)
Urobilinogen, UA: 0.2 E.U./dL
pH, UA: 5 (ref 5.0–8.0)

## 2019-04-13 NOTE — Progress Notes (Signed)
ROB-Doing well, no questions or concerns. NST performed today was reviewed and was found to be reactive.  Baseline 130 bpm with moderate variability, accelerations present, and no decelerations noted. AFI normal at 7.3 cm. Growth ultrasound discussed with patient. Not consistently checking blood sugars; will collect A1c today, see orders. Anticipatory guidance regarding course of prenatal care.  Reviewed red flag symptoms and when to call. Continue recommended antenatal testing and prenatal care.  ULTRASOUND REPORT  Location: [facility] Date of Service: 04/13/2019   Indications:growth/afi   Findings:  Diamond Sutton intrauterine pregnancy is visualized with FHR at 127 BPM. Biometrics give an (U/S) Gestational age of [redacted]w[redacted]d and an (U/S) EDD of 06/06/2019; this correlates with the clinically established Estimated Date of Delivery: 06/01/19.  Fetal presentation is Cephalic.  Placenta: posterior. Grade: 1 AFI: 7.3 cm  Growth percentile is 43. EFW: 2080 g ( 4 lb 9 oz)   Impression: 1. [redacted]w[redacted]d Viable Singleton Intrauterine pregnancy previously established criteria. 2. Growth is 43 %ile.  AFI is 7.3 cm.   Recommendations: 1.Clinical correlation with the patient's History and Physical Exam.

## 2019-04-13 NOTE — Patient Instructions (Signed)
Fetal Movement Counts Patient Name: ________________________________________________ Patient Due Date: ____________________ What is a fetal movement count?  A fetal movement count is the number of times that you feel your baby move during a certain amount of time. This may also be called a fetal kick count. A fetal movement count is recommended for every pregnant woman. You may be asked to start counting fetal movements as early as week 28 of your pregnancy. Pay attention to when your baby is most active. You may notice your baby's sleep and wake cycles. You may also notice things that make your baby move more. You should do a fetal movement count:  When your baby is normally most active.  At the same time each day. A good time to count movements is while you are resting, after having something to eat and drink. How do I count fetal movements? 1. Find a quiet, comfortable area. Sit, or lie down on your side. 2. Write down the date, the start time and stop time, and the number of movements that you felt between those two times. Take this information with you to your health care visits. 3. Write down your start time when you feel the first movement. 4. Count kicks, flutters, swishes, rolls, and jabs. You should feel at least 10 movements. 5. You may stop counting after you have felt 10 movements, or if you have been counting for 2 hours. Write down the stop time. 6. If you do not feel 10 movements in 2 hours, contact your health care provider for further instructions. Your health care provider may want to do additional tests to assess your baby's well-being. Contact a health care provider if:  You feel fewer than 10 movements in 2 hours.  Your baby is not moving like he or she usually does. Date: ____________ Start time: ____________ Stop time: ____________ Movements: ____________ Date: ____________ Start time: ____________ Stop time: ____________ Movements: ____________ Date: ____________  Start time: ____________ Stop time: ____________ Movements: ____________ Date: ____________ Start time: ____________ Stop time: ____________ Movements: ____________ Date: ____________ Start time: ____________ Stop time: ____________ Movements: ____________ Date: ____________ Start time: ____________ Stop time: ____________ Movements: ____________ Date: ____________ Start time: ____________ Stop time: ____________ Movements: ____________ Date: ____________ Start time: ____________ Stop time: ____________ Movements: ____________ Date: ____________ Start time: ____________ Stop time: ____________ Movements: ____________ This information is not intended to replace advice given to you by your health care provider. Make sure you discuss any questions you have with your health care provider. Document Revised: 08/25/2018 Document Reviewed: 08/25/2018 Elsevier Patient Education  2020 [employer]  

## 2019-04-14 ENCOUNTER — Telehealth: Payer: Self-pay | Admitting: Certified Nurse Midwife

## 2019-04-14 DIAGNOSIS — Z3493 Encounter for supervision of normal pregnancy, unspecified, third trimester: Secondary | ICD-10-CM

## 2019-04-14 DIAGNOSIS — O24419 Gestational diabetes mellitus in pregnancy, unspecified control: Secondary | ICD-10-CM

## 2019-04-14 DIAGNOSIS — Z3A33 33 weeks gestation of pregnancy: Secondary | ICD-10-CM

## 2019-04-14 LAB — RPR: RPR Ser Ql: NONREACTIVE

## 2019-04-14 LAB — HEMOGLOBIN A1C
Est. average glucose Bld gHb Est-mCnc: 128 mg/dL
Hgb A1c MFr Bld: 6.1 % — ABNORMAL HIGH (ref 4.8–5.6)

## 2019-04-14 LAB — CBC
Hematocrit: 34.8 % (ref 34.0–46.6)
Hemoglobin: 11.6 g/dL (ref 11.1–15.9)
MCH: 30.1 pg (ref 26.6–33.0)
MCHC: 33.3 g/dL (ref 31.5–35.7)
MCV: 90 fL (ref 79–97)
Platelets: 249 10*3/uL (ref 150–450)
RBC: 3.86 x10E6/uL (ref 3.77–5.28)
RDW: 12.8 % (ref 11.7–15.4)
WBC: 11.6 10*3/uL — ABNORMAL HIGH (ref 3.4–10.8)

## 2019-04-14 MED ORDER — GLYBURIDE 2.5 MG PO TABS: 2.5000 mg | ORAL_TABLET | Freq: Two times a day (BID) | ORAL | 0 refills | Status: DC

## 2019-04-14 NOTE — Telephone Encounter (Signed)
4045 Telephone call to patient, verified full name and date of birth.   Lab results reviewed with patient, verbalized understanding.   Agrees to start Glyburide 2.5 mg BID as recommended by Dr. Valentino Saxon.   Reviewed red flag symptoms and when to call.   RTC with blood sugar log as previously scheduled or sooner if needed.    Gunnar Bulla, CNM [facility], [facility] 04/14/19 9:56 AM

## 2019-04-18 DIAGNOSIS — Z5181 Encounter for therapeutic drug level monitoring: Secondary | ICD-10-CM | POA: Diagnosis not present

## 2019-04-19 ENCOUNTER — Ambulatory Visit (INDEPENDENT_AMBULATORY_CARE_PROVIDER_SITE_OTHER): Payer: Medicaid Other

## 2019-04-19 ENCOUNTER — Ambulatory Visit (INDEPENDENT_AMBULATORY_CARE_PROVIDER_SITE_OTHER): Payer: Medicaid Other | Admitting: Certified Nurse Midwife

## 2019-04-19 ENCOUNTER — Other Ambulatory Visit: Payer: Medicaid Other

## 2019-04-19 ENCOUNTER — Other Ambulatory Visit: Payer: Self-pay

## 2019-04-19 ENCOUNTER — Encounter: Payer: Self-pay | Admitting: Certified Nurse Midwife

## 2019-04-19 ENCOUNTER — Observation Stay
Admission: EM | Admit: 2019-04-19 | Discharge: 2019-04-19 | Disposition: A | Discharge status: Home / Self Care | Payer: Medicaid Other | Attending: Certified Nurse Midwife | Admitting: Certified Nurse Midwife

## 2019-04-19 VITALS — BP 124/62 | HR 92 | Wt 178.1 lb

## 2019-04-19 DIAGNOSIS — Z362 Encounter for other antenatal screening follow-up: Secondary | ICD-10-CM | POA: Diagnosis not present

## 2019-04-19 DIAGNOSIS — Z7982 Long term (current) use of aspirin: Secondary | ICD-10-CM | POA: Insufficient documentation

## 2019-04-19 DIAGNOSIS — Z3493 Encounter for supervision of normal pregnancy, unspecified, third trimester: Secondary | ICD-10-CM

## 2019-04-19 DIAGNOSIS — O368330 Maternal care for abnormalities of the fetal heart rate or rhythm, third trimester, not applicable or unspecified: Secondary | ICD-10-CM | POA: Diagnosis not present

## 2019-04-19 DIAGNOSIS — O24419 Gestational diabetes mellitus in pregnancy, unspecified control: Secondary | ICD-10-CM

## 2019-04-19 DIAGNOSIS — O24415 Gestational diabetes mellitus in pregnancy, controlled by oral hypoglycemic drugs: Secondary | ICD-10-CM | POA: Diagnosis not present

## 2019-04-19 DIAGNOSIS — Z3A33 33 weeks gestation of pregnancy: Secondary | ICD-10-CM

## 2019-04-19 DIAGNOSIS — Z7984 Long term (current) use of oral hypoglycemic drugs: Secondary | ICD-10-CM | POA: Diagnosis not present

## 2019-04-19 DIAGNOSIS — Z880 Allergy status to penicillin: Secondary | ICD-10-CM | POA: Diagnosis not present

## 2019-04-19 DIAGNOSIS — I10 Essential (primary) hypertension: Secondary | ICD-10-CM

## 2019-04-19 DIAGNOSIS — Z881 Allergy status to other antibiotic agents status: Secondary | ICD-10-CM | POA: Insufficient documentation

## 2019-04-19 DIAGNOSIS — O10013 Pre-existing essential hypertension complicating pregnancy, third trimester: Secondary | ICD-10-CM | POA: Diagnosis not present

## 2019-04-19 DIAGNOSIS — O288 Other abnormal findings on antenatal screening of mother: Secondary | ICD-10-CM | POA: Diagnosis present

## 2019-04-19 DIAGNOSIS — O289 Unspecified abnormal findings on antenatal screening of mother: Secondary | ICD-10-CM | POA: Diagnosis present

## 2019-04-19 DIAGNOSIS — O09299 Supervision of pregnancy with other poor reproductive or obstetric history, unspecified trimester: Secondary | ICD-10-CM

## 2019-04-19 DIAGNOSIS — O09899 Supervision of other high risk pregnancies, unspecified trimester: Secondary | ICD-10-CM

## 2019-04-19 LAB — POCT URINALYSIS DIPSTICK OB
Bilirubin, UA: NEGATIVE
Blood, UA: NEGATIVE
Glucose, UA: NEGATIVE
Ketones, UA: NEGATIVE
Leukocytes, UA: NEGATIVE
Nitrite, UA: NEGATIVE
POC,PROTEIN,UA: NEGATIVE
Spec Grav, UA: >=1.03 — AB (ref 1.010–1.025)
Urobilinogen, UA: 0.2 E.U./dL
pH, UA: 6 (ref 5.0–8.0)

## 2019-04-19 LAB — GLUCOSE, CAPILLARY
Glucose-Capillary: 80 mg/dL (ref 70–99)
Glucose-Capillary: 145 mg/dL — ABNORMAL HIGH (ref 70–99)

## 2019-04-19 MED ORDER — GLYBURIDE 2.5 MG PO TABS
2.5000 mg | ORAL_TABLET | ORAL | Status: AC
  Administered 2019-04-19: 21:00:00 2.5 mg via ORAL
  Filled 2019-04-19 (×2): qty 1

## 2019-04-19 NOTE — Discharge Instructions (Signed)
Gestational Diabetes Mellitus, Self Care When you have gestational diabetes (gestational diabetes mellitus), you must make sure your blood sugar (glucose) stays in a healthy range. You can do this with:  Nutrition.  Exercise.  Lifestyle changes.  Medicines or insulin, if needed.  Support from your doctors and others. If you get treated for this condition, it may not hurt you or your unborn baby (fetus). If you do not get treated for this condition, it may cause problems that can hurt you or your unborn baby. If you get gestational diabetes, you are:  More likely to get it if you get pregnant again.  More likely to develop type 2 diabetes in the future. How to stay aware of blood sugar   Check your blood sugar every day while you are pregnant. Check it as often as told.  Call your doctor if your blood sugar is above your goal numbers for two tests in a row. Your doctor will set personal treatment goals for you. Generally, you should have these blood sugar levels:  Before meals, or after not eating for a long time (fasting or preprandial): at or below 95 mg/dL (5.3 mmol/L).  After meals (postprandial): ? One hour after a meal: at or below 140 mg/dL (7.8 mmol/L). ? Two hours after a meal: at or below 120 mg/dL (6.7 mmol/L).  A1c (hemoglobin A1c) level: 6-6.5%. How to manage high and low blood sugar Signs of high blood sugar High blood sugar is called hyperglycemia. Know the early signs of high blood sugar. Signs may include:  Feeling: ? Thirsty. ? Hungry. ? Very tired.  Needing to pee (urinate) more than usual.  Blurry vision. Signs of low blood sugar Low blood sugar is called hypoglycemia. This is when blood sugar is at or below 70 mg/dL (3.9 mmol/L). Signs may include:  Feeling: ? Hungry. ? Worried or nervous (anxious). ? Sweaty and clammy. ? Confused. ? Dizzy. ? Sleepy. ? Sick to your stomach (nauseous).  Having: ? A fast heartbeat. ? A headache. ? A change  in your vision. ? Tingling or no feeling (numbness) around your mouth, lips, or tongue. ? Jerky movements that you cannot control (seizure).  Having trouble with: ? Moving (coordination). ? Sleeping. ? Passing out (fainting). ? Getting upset easily (irritability). Treating low blood sugar To treat low blood sugar, eat or drink something sugary right away. If you can think clearly and swallow safely, follow the 15:15 rule:  Take 15 grams of a fast-acting carb (carbohydrate). Talk with your doctor about how much you should take.  Some fast-acting carbs are: ? Sugar tablets (glucose pills). Take 3-4 glucose pills. ? 6-8 pieces of hard candy. ? 4-6 oz (120-150 mL) of fruit juice. ? 4-6 oz (120-150 mL) of regular (not diet) soda. ? 1 Tbsp (15 mL) honey or sugar.  Check your blood sugar 15 minutes after you take the carb.  If your blood sugar is still at or below 70 mg/dL (3.9 mmol/L), take 15 grams of a carb again.  If your blood sugar does not go above 70 mg/dL (3.9 mmol/L) after 3 tries, get help right away.  After your blood sugar goes back to normal, eat a meal or a snack within 1 hour. Treating very low blood sugar If your blood sugar is at or below 54 mg/dL (3 mmol/L), you have very low blood sugar (severe hypoglycemia). This is an emergency. Do not wait to see if the symptoms will go away. Get medical help right  away. Call your local emergency services (911 in the [place] If you have very low blood sugar and you cannot eat or drink, you may need a glucagon shot (injection). A family member or friend should learn how to check your blood sugar and how to give you a glucagon shot. Ask your doctor if you need to have a glucagon shot kit at home. Follow these instructions at home: Medicine  Take your insulin and diabetes medicines as told.  If your doctor says you should take more or less insulin or medicines, do this exactly as told.  Do not run out of insulin or  medicines. Food   Make healthy food choices. These include: ? Chicken, fish, egg whites, and beans. ? Oats, whole wheat, bulgur, brown rice, quinoa, and millet. ? Fresh fruits and vegetables. ? Low-fat dairy products. ? Nuts, avocado, olive oil, and canola oil.  Meet with a food specialist (dietitian). He or she can help you make an eating plan that is right for you.  Follow instructions from your doctor about what you cannot eat or drink.  Drink enough fluid to keep your pee (urine) pale yellow.  Eat healthy snacks between healthy meals.  Keep track of carbs that you eat. Do this by reading food labels and learning food serving sizes.  Follow your sick day plan when you cannot eat or drink normally. Make this plan with your doctor so it is ready to use. Activity  Exercise for 30 or more minutes a day, or as much as your doctor recommends.  Talk with your doctor before you start a new exercise or activity. Your doctor may need to tell you to change: ? How much insulin or medicines you take. ? How much food you eat. Lifestyle  Do not drink alcohol.  Do not use any tobacco products. These include cigarettes, chewing tobacco, and e-cigarettes. If you need help quitting, ask your doctor.  Learn how to deal with stress. If you need help with this, ask your doctor. Body care  Stay up to date with your shots (immunizations).  Brush your teeth and gums two times a day. Floss one or more times a day.  Go to the dentist one or more times every 6 months.  Stay at a healthy weight while you are pregnant. General instructions  Take over-the-counter and prescription medicines only as told by your doctor.  Ask your doctor about risks of high blood pressure in pregnancy (preeclampsia and eclampsia).  Share your diabetes care plan with: ? Your work or school. ? People you live with.  Check your pee for ketones: ? When you are sick. ? As told by your doctor.  Carry a card or  wear jewelry that says you have diabetes.  Keep all follow-up visits as told by your doctor. This is important. Care after giving birth  Have your blood sugar checked 4-12 weeks after you give birth.  Get checked for diabetes one or more times during 3 years. Questions to ask your doctor  Do I need to meet with a diabetes educator?  Where can I find a support group for people with gestational diabetes? Where to find more information To learn more about diabetes, visit:  American Diabetes Association: www.diabetes.org  Centers for Disease Control and Prevention (CDC): www.cdc.gov Summary  Check your blood sugar (glucose) every day while you are pregnant. Check it as often as told.  Take your insulin and diabetes medicines as told.  Keep all follow-up visits as   told by your doctor. This is important.  Have your blood sugar checked 4-12 weeks after you give birth. This information is not intended to replace advice given to you by your health care provider. Make sure you discuss any questions you have with your health care provider. Document Revised: 06/28/2017 Document Reviewed: 02/08/2015 Elsevier Patient Education  [place].   Glyburide tablets What is this medicine? GLYBURIDE (GLYE byoor ide) helps to treat type 2 diabetes. Treatment is combined with diet and exercise. The medicine helps your body to use insulin better. This medicine may be used for other purposes; ask your health care provider or pharmacist if you have questions. COMMON BRAND NAME(S): Diabeta, Glycron, Glynase PresTab, Micronase What should I tell my health care provider before I take this medicine? They need to know if you have any of these conditions:  diabetic ketoacidosis  glucose-6-phosphate dehydrogenase deficiency  heart disease  kidney disease  liver disease  severe infection or injury  thyroid disease  an unusual or allergic reaction to glyburide, sulfa drugs, other  medicines, foods, dyes, or preservatives  pregnant or trying to get pregnant  breast-feeding How should I use this medicine? Take this medicine by mouth with a glass of water. Follow the directions on the prescription label. If you take this medicine once a day, take it with breakfast or the first main meal of the day. Take your medicine at the same time each day. Do not take more often than directed. Talk to your pediatrician regarding the use of this medicine in children. Special care may be needed. Elderly patients over 32 years old may have a stronger reaction and need a smaller dose. Overdosage: If you think you have taken too much of this medicine contact a poison control center or emergency room at once. NOTE: This medicine is only for you. Do not share this medicine with others. What if I miss a dose? If you miss a dose, take it as soon as you can. If it is almost time for your next dose, take only that dose. Do not take double or extra doses. What may interact with this medicine? Do not take this medicine with any of the following medications:  bosentan This medicine may also interact with the following medications:  cisapride  clarithromycin  colesevelam  metoclopramide  miconazole  probenecid  topiramate  warfarin Many medications may cause an increase or decrease in blood sugar, these include:  alcohol containing beverages  angiotensin converting enzyme inhibitors like enalapril, captopril, and lisinopril  aspirin and aspirin-like drugs  beta-blockers like metoprolol and propranolol  calcium channel blockers like diltiazem and verapamil  chloramphenicol  chromium  female hormones, like estrogens or progestins and birth control pills  fluoxetine  heart medicines like disopyramide  female hormones or anabolic steroids  medicines called MAO Inhibitors like Nardil, Parnate, Marplan, Eldepryl  medicines for allergies, asthma, cold, or  cough  medicines for mental problems  medicines for weight loss  niacin  NSAIDs, medicines for pain and inflammation, like ibuprofen or naproxen  pentamidine  phenytoin  probenecid  quinolone antibiotics like ciprofloxacin, levofloxacin, ofloxacin  some herbal dietary supplements  steroid medicines like prednisone or cortisone  thyroid medicine  water pills or diuretics This list may not describe all possible interactions. Give your health care provider a list of all the medicines, herbs, non-prescription drugs, or dietary supplements you use. Also tell them if you smoke, drink alcohol, or use illegal drugs. Some items may interact with your  medicine. What should I watch for while using this medicine? Visit your doctor or health care professional for regular checks on your progress. A test called the HbA1C (A1C) will be monitored. This is a simple blood test. It measures your blood sugar control over the last 2 to 3 months. You will receive this test every 3 to 6 months. Learn how to check your blood sugar. Learn the symptoms of low and high blood sugar and how to manage them. Always carry a quick-source of sugar with you in case you have symptoms of low blood sugar. Examples include hard sugar candy or glucose tablets. Make sure others know that you can choke if you eat or drink when you develop serious symptoms of low blood sugar, such as seizures or unconsciousness. They must get medical help at once. Tell your doctor or health care professional if you have high blood sugar. You might need to change the dose of your medicine. If you are sick or exercising more than usual, you might need to change the dose of your medicine. Do not skip meals. Ask your doctor or health care professional if you should avoid alcohol. Many nonprescription cough and cold products contain sugar or alcohol. These can affect blood sugar. This medicine can make you more sensitive to the sun. Keep out of the  sun. If you cannot avoid being in the sun, wear protective clothing and use sunscreen. Do not use sun lamps or tanning beds/booths. Wear a medical ID bracelet or chain, and carry a card that describes your disease and details of your medicine and dosage times. This medicine may cause a decrease in Co-Enyzme Q-10. You should make sure that you get enough Co-Enzyme Q-10 while you are taking this medicine. Discuss the foods you eat and the vitamins you take with your health care professional. What side effects may I notice from receiving this medicine? Side effects that you should report to your doctor or health care professional as soon as possible:  allergic reactions like skin rash, itching or hives, swelling of the face, lips, or tongue  breathing problems  dark urine  fever, chills, sore throat  signs and symptoms of low blood sugar such as feeling anxious, confusion, dizziness, increased hunger, unusually weak or tired, sweating, shakiness, cold, irritable, headache, blurred vision, fast heartbeat, loss of consciousness  unusual bleeding or bruising  yellowing of the eyes or skin Side effects that usually do not require medical attention (report to your doctor or health care professional if they continue or are bothersome):  diarrhea  dizziness  gas  headache  nausea  upset stomach This list may not describe all possible side effects. Call your doctor for medical advice about side effects. You may report side effects to FDA at 1-800-FDA-1088. Where should I keep my medicine? Keep out of the reach of children. Store at room temperature between 15 and 30 degrees C (59 and 86 degrees F). Throw away any unused medicine after the expiration date. NOTE: This sheet is a summary. It may not cover all possible information. If you have questions about this medicine, talk to your doctor, pharmacist, or health care provider.  2020 Elsevier/Gold Standard (2017-02-03 10:23:18)   Fetal  Movement Counts Patient Name: ________________________________________________ Patient Due Date: ____________________ What is a fetal movement count?  A fetal movement count is the number of times that you feel your baby move during a certain amount of time. This may also be called a fetal kick count. A fetal  movement count is recommended for every pregnant woman. You may be asked to start counting fetal movements as early as week 28 of your pregnancy. Pay attention to when your baby is most active. You may notice your baby's sleep and wake cycles. You may also notice things that make your baby move more. You should do a fetal movement count:  When your baby is normally most active.  At the same time each day. A good time to count movements is while you are resting, after having something to eat and drink. How do I count fetal movements? 1. Find a quiet, comfortable area. Sit, or lie down on your side. 2. Write down the date, the start time and stop time, and the number of movements that you felt between those two times. Take this information with you to your health care visits. 3. Write down your start time when you feel the first movement. 4. Count kicks, flutters, swishes, rolls, and jabs. You should feel at least 10 movements. 5. You may stop counting after you have felt 10 movements, or if you have been counting for 2 hours. Write down the stop time. 6. If you do not feel 10 movements in 2 hours, contact your health care provider for further instructions. Your health care provider may want to do additional tests to assess your baby's well-being. Contact a health care provider if:  You feel fewer than 10 movements in 2 hours.  Your baby is not moving like he or she usually does. Date: ____________ Start time: ____________ Stop time: ____________ Movements: ____________ Date: ____________ Start time: ____________ Stop time: ____________ Movements: ____________ Date: ____________ Start  time: ____________ Stop time: ____________ Movements: ____________ Date: ____________ Start time: ____________ Stop time: ____________ Movements: ____________ Date: ____________ Start time: ____________ Stop time: ____________ Movements: ____________ Date: ____________ Start time: ____________ Stop time: ____________ Movements: ____________ Date: ____________ Start time: ____________ Stop time: ____________ Movements: ____________ Date: ____________ Start time: ____________ Stop time: ____________ Movements: ____________ Date: ____________ Start time: ____________ Stop time: ____________ Movements: ____________ This information is not intended to replace advice given to you by your health care provider. Make sure you discuss any questions you have with your health care provider. Document Revised: 08/25/2018 Document Reviewed: 08/25/2018 Elsevier Patient Education  [place].

## 2019-04-19 NOTE — Progress Notes (Signed)
ROB , nst and u/s today for GDM on medication. Nst shows prolonged deceleration to 90's x 5 min.  Baseline140's moderate variability, accelerations present , decel x 1 to 90's as noted above. U/s results below. BS log reviewed. Pt states she started her meds "a few days ago" and did not document sugars because it had an error message ("arrow"). Pt state she figured out that meant it needed a new battery. Today sugar fasting 135 , 2 hr pp after breakfast 162. Dr. Logan Bores consulted. Pt to L&D for prolonged monitoring and she will bring in a completed log on Monday for the rest of this week and weekend. Discussed Midwifery care and risking out of care. She verbalizes and agrees to plan .   Doreene Burke, CNM   Patient Name: Diamond Sutton DOB: August 29, 1985 MRN: [id number] ULTRASOUND REPORT  Location: [facility] Date of Service: 04/19/2019   Indications:AFI Findings:  Mason Jim intrauterine pregnancy is visualized with FHR at 150 BPM.  Fetal presentation is Cephalic.  Placenta: fundal. Grade: 1 AFI: 7.6 cm  Impression: 1. [redacted]w[redacted]d Viable Singleton Intrauterine pregnancy dated by previously established criteria. 2. AFI is 7.6 cm.   Recommendations: 1.Clinical correlation with the patient's History and Physical Exam.   Jenine M. Marciano Sequin    RDMS

## 2019-04-19 NOTE — OB Triage Note (Signed)
Patient given discharge instructions and verbalized understanding. Discharged ambulatory in stable condition accompanied by RN.

## 2019-04-19 NOTE — Discharge Summary (Signed)
Obstetric Discharge Summary  Patient ID: Diamond Sutton MRN: [id number] DOB/AGE: October 10, 1985 34 y.o.   Date of Admission: 04/19/2019  Date of Discharge:  04/19/19  Admitting Diagnosis: Observation at [redacted]w[redacted]d  Secondary Diagnosis: NST with decelerations in office     Discharge Diagnosis: No other diagnosis   Antepartum Procedures: Prolonged fetal monitoring, Capillary Blood Glucose    Brief Hospital Course   L&D OB Triage Note  Diamond Sutton is a 34 y.o. [id number] female at [redacted]w[redacted]d, EDD Estimated Date of Delivery: 06/01/19 who presented to triage from the office for extended fetal monitoring after prolonged decelerations was seen in office on NST.  She was evaluated by the nurses and myself with  no significant findings for fetal distress. Vital signs stable. An NST was performed and has been reviewed by CNM. She was monitored for an additional four (4) hours. No other treatment was needed.   NST INTERPRETATION:  Indications: NST with decelerations in office  Mode: External Baseline Rate (A): 135 bpm Variability: Moderate Accelerations: 15 x 15 Decelerations: None Contraction Frequency (min): none  Impression: reactive  Recent Results (from the past 2160 hour(s))  POC Urinalysis Dipstick OB     Status: Abnormal   Collection Time: 04/19/19  4:11 PM  Result Value Ref Range   Color, UA yellow    Clarity, UA clear    Glucose, UA Negative Negative   Bilirubin, UA neg    Ketones, UA neg    Spec Grav, UA >=1.030 (A) 1.010 - 1.025   Blood, UA neg    pH, UA 6.0 5.0 - 8.0   POC,PROTEIN,UA Negative Negative, Trace, Small (1+), Moderate (2+), Large (3+), 4+   Urobilinogen, UA 0.2 0.2 or 1.0 E.U./dL   Nitrite, UA neg    Leukocytes, UA Negative Negative   Appearance     Odor    Glucose, capillary     Status: None   Collection Time: 04/19/19  4:30 PM  Result Value Ref Range   Glucose-Capillary 80 70 - 99 mg/dL    Comment: Glucose reference range applies only to samples  taken after fasting for at least 8 hours.  Glucose, capillary     Status: Abnormal   Collection Time: 04/19/19  7:51 PM  Result Value Ref Range   Glucose-Capillary 145 (H) 70 - 99 mg/dL    Comment: Glucose reference range applies only to samples taken after fasting for at least 8 hours.     Plan: NST performed was reviewed and was found to be reactive. She was discharged home with bleeding/labor precautions.  Continue routine prenatal care. Follow up with CNM as previously scheduled.    Discharge Instructions: Per After Visit Summary.  Activity:  Also refer to After Visit Summary.  Diet: Gestational diabetes, Low Carbohydrate Diet  Medications: Allergies as of 04/19/2019      Reactions   Penicillins Anaphylaxis   12/2014: received ancef w/o complications Has patient had a PCN reaction causing immediate rash, facial/tongue/throat swelling, SOB or lightheadedness with hypotension: Yes Has patient had a PCN reaction causing severe rash involving mucus membranes or skin necrosis: Yes Has patient had a PCN reaction that required hospitalization Yes Has patient had a PCN reaction occurring within the last 10 years: No If all of the above answers are "NO", then may proceed with Cephalosporin use.      Medication List    TAKE these medications   aspirin 81 MG EC tablet Take 1 tablet (81 mg total) by mouth daily.  Swallow whole.   glucose blood test strip Test blood sugar 4 times per day and PRN.   glyBURIDE 2.5 MG tablet Commonly known as: DIABETA Take 1 tablet (2.5 mg total) by mouth 2 (two) times daily with a meal.   Prenatal Vitamin 27-0.8 MG Tabs Take 1 tablet by mouth daily at 6 (six) AM.      Outpatient follow up:  Follow-up Information    [facility].   Why: Follow up as previously scheduled or sooner if needed Contact information: [place]  [address] [phone]         Postpartum contraception:  bilateral tubal ligation  Discharged Condition: stable  Discharged to: home   Diona Fanti, CNM [facility], [facility] 04/19/19 8:00 PM

## 2019-04-19 NOTE — OB Triage Note (Signed)
Patient sent from [facility] office for extended fetal monitoring due to a fhr deceleration heard in the office.  Patient denies leaking of fluid or vaginal bleeding. Reports normal fetal movements felt.

## 2019-04-19 NOTE — Addendum Note (Signed)
Addended by: Brooke Dare on: 04/19/2019 04:12 PM   Modules accepted: Orders

## 2019-04-19 NOTE — Patient Instructions (Signed)

## 2019-04-26 ENCOUNTER — Other Ambulatory Visit: Payer: Medicaid Other

## 2019-04-26 ENCOUNTER — Ambulatory Visit (INDEPENDENT_AMBULATORY_CARE_PROVIDER_SITE_OTHER): Payer: Medicaid Other | Admitting: Certified Nurse Midwife

## 2019-04-26 ENCOUNTER — Other Ambulatory Visit: Payer: Self-pay

## 2019-04-26 VITALS — BP 121/75 | HR 89 | Wt 178.4 lb

## 2019-04-26 DIAGNOSIS — O24415 Gestational diabetes mellitus in pregnancy, controlled by oral hypoglycemic drugs: Secondary | ICD-10-CM

## 2019-04-26 DIAGNOSIS — Z3A34 34 weeks gestation of pregnancy: Secondary | ICD-10-CM | POA: Diagnosis not present

## 2019-04-26 LAB — POCT URINALYSIS DIPSTICK OB
Bilirubin, UA: NEGATIVE
Blood, UA: NEGATIVE
Glucose, UA: NEGATIVE
Ketones, UA: NEGATIVE
Leukocytes, UA: NEGATIVE
Nitrite, UA: NEGATIVE
POC,PROTEIN,UA: NEGATIVE
Spec Grav, UA: 1.02 (ref 1.010–1.025)
Urobilinogen, UA: 0.2 E.U./dL
pH, UA: 5 (ref 5.0–8.0)

## 2019-04-26 MED ORDER — GLYBURIDE 5 MG PO TABS: 5.0000 mg | ORAL_TABLET | Freq: Every day | ORAL | 1 refills | Status: DC

## 2019-04-26 MED ORDER — GLYBURIDE 2.5 MG PO TABS: 2.5000 mg | ORAL_TABLET | Freq: Every day | ORAL | 2 refills | Status: DC

## 2019-04-26 NOTE — Progress Notes (Signed)
ROB and NST for GDM on medication. Log reviewed. Copy made scan to chart. Fasting 3 elevated/7 days (115,106,105) 2 hr pp 8 elevated above 120. Dr. Logan Bores consulted. increased glyburide 2.5 mg po with breakfast and increase evening dose to 5 mg. Discussed fetal kick counts. .Expressed importance of log completion and bringing it to each visit. She verbalizes and agrees to plan. Follow up 1 wk   NST reactive Baseline 135 accelerations present Deceleration absent Moderate variability Toco: no contractions noted.   Return 1 wk with BS log for review and NST.   Doreene Burke, CNM

## 2019-04-26 NOTE — Patient Instructions (Signed)
Gestational Diabetes Mellitus, Diagnosis Gestational diabetes (gestational diabetes mellitus) is a short-term (temporary) form of diabetes that can happen during pregnancy. It goes away after you give birth. It may be caused by one or both of these problems:  Your pancreas does not make enough of a hormone called insulin.  Your body does not respond in a normal way to insulin that it makes. Insulin lets sugars (glucose) go into cells in the body. This gives you energy. If you have diabetes, sugars cannot get into cells. This causes high blood sugar (hyperglycemia). If you get gestational diabetes, you are:  More likely to get it if you get pregnant again.  More likely to develop type 2 diabetes in the future. If gestational diabetes is treated, it may not hurt you or your baby. Your doctor will set treatment goals for you. In general, you should have these blood sugar levels:  After not eating for a long time (fasting): 95 mg/dL (5.3 mmol/L).  After meals (postprandial): ? One hour after a meal: at or below 140 mg/dL (7.8 mmol/L). ? Two hours after a meal: at or below 120 mg/dL (6.7 mmol/L).  A1c (hemoglobin A1c) level: 6-6.5%. Follow these instructions at home: Questions to ask your doctor   You may want to ask these questions: ? Do I need to meet with a diabetes educator? ? What equipment will I need to care for myself at home? ? What medicines do I need? When should I take them? ? How often do I need to check my blood sugar? ? What number can I call if I have questions? ? When is my next doctor's visit? General instructions  Take over-the-counter and prescription medicines only as told by your doctor.  Stay at a healthy weight during pregnancy.  Keep all follow-up visits as told by your doctor. This is important. Contact a doctor if:  Your blood sugar is at or above 240 mg/dL (13.3 mmol/L).  Your blood sugar is at or above 200 mg/dL (11.1 mmol/L) and you have ketones in  your pee (urine).  You have been sick or have had a fever for 2 days or more and you are not getting better.  You have any of these problems for more than 6 hours: ? You cannot eat or drink. ? You feel sick to your stomach (nauseous). ? You throw up (vomit). ? You have watery poop (diarrhea). Get help right away if:  Your blood sugar is lower than 54 mg/dL (3 mmol/L).  You get confused.  You have trouble: ? Thinking clearly. ? Breathing.  Your baby moves less than normal.  You have any of these: ? Moderate or large ketone levels in your pee. ? Blood coming from your vagina. ? Unusual fluid coming from your vagina. ? Early contractions. These may feel like tightness in your belly. Summary  Gestational diabetes is a short-term form of diabetes. It can happen while you are pregnant. It goes away after you give birth.  If gestational diabetes is treated, it may not hurt you or your baby. Your doctor will set treatment goals for you.  Keep all follow-up visits as told by your doctor. This is important. This information is not intended to replace advice given to you by your health care provider. Make sure you discuss any questions you have with your health care provider. Document Revised: 02/11/2017 Document Reviewed: 02/08/2015 Elsevier Patient Education  2020 Elsevier Inc.  

## 2019-05-01 ENCOUNTER — Other Ambulatory Visit: Payer: Self-pay | Admitting: Certified Nurse Midwife

## 2019-05-01 ENCOUNTER — Telehealth: Payer: Self-pay | Admitting: Certified Nurse Midwife

## 2019-05-01 DIAGNOSIS — O26893 Other specified pregnancy related conditions, third trimester: Secondary | ICD-10-CM

## 2019-05-01 DIAGNOSIS — R519 Headache, unspecified: Secondary | ICD-10-CM

## 2019-05-01 DIAGNOSIS — O09529 Supervision of elderly multigravida, unspecified trimester: Secondary | ICD-10-CM

## 2019-05-01 DIAGNOSIS — Z5181 Encounter for therapeutic drug level monitoring: Secondary | ICD-10-CM | POA: Diagnosis not present

## 2019-05-01 DIAGNOSIS — L299 Pruritus, unspecified: Secondary | ICD-10-CM

## 2019-05-01 NOTE — Telephone Encounter (Signed)
Patient called in saying for 2 days shes been really itchy all over. Patient hasnt taken anything over the counter. Patient also states that she has a headache. Patient would like a call back from the nurse.

## 2019-05-01 NOTE — Progress Notes (Signed)
Pt complains of itching and headache. Orders placed for lab work.   Doreene Burke, CMN

## 2019-05-02 ENCOUNTER — Telehealth: Payer: Self-pay

## 2019-05-02 NOTE — Telephone Encounter (Signed)
Attempted to contact patient- 05/01/19 mychart message sent to patient; Telephone call placed 05/02/19 unable to leave message due to full mailbox. Patient needs to come to office for lab draw for c/o generalized itching as stated in message received from [place] on 05/01/19.

## 2019-05-03 ENCOUNTER — Other Ambulatory Visit: Payer: Medicaid Other

## 2019-05-03 ENCOUNTER — Encounter: Payer: Medicaid Other | Admitting: Certified Nurse Midwife

## 2019-05-03 ENCOUNTER — Telehealth: Payer: Self-pay

## 2019-05-03 NOTE — Telephone Encounter (Signed)
Telephone calls placed to both telephone numbers listed for patient. [phone] unable to leave a message due to full mailbox. [phone] voicemail left for patient to return my call. Mychart message sent to patient requesting she call the office to make an appointment with one of the MDs ASAP.

## 2019-05-04 ENCOUNTER — Telehealth: Payer: Self-pay

## 2019-05-04 NOTE — Telephone Encounter (Signed)
Responded to this message 05/04/19. Patient sent several mychart messages regarding this. However she has not kept her appointments to address this.

## 2019-05-05 ENCOUNTER — Ambulatory Visit (INDEPENDENT_AMBULATORY_CARE_PROVIDER_SITE_OTHER): Payer: Medicaid Other | Admitting: Obstetrics and Gynecology

## 2019-05-05 ENCOUNTER — Encounter: Payer: Medicaid Other | Admitting: Certified Nurse Midwife

## 2019-05-05 ENCOUNTER — Encounter: Payer: Self-pay | Admitting: Obstetrics and Gynecology

## 2019-05-05 ENCOUNTER — Other Ambulatory Visit: Payer: Self-pay

## 2019-05-05 ENCOUNTER — Other Ambulatory Visit: Payer: Medicaid Other

## 2019-05-05 VITALS — BP 128/84 | HR 84 | Wt 179.3 lb

## 2019-05-05 DIAGNOSIS — Z3A36 36 weeks gestation of pregnancy: Secondary | ICD-10-CM | POA: Diagnosis not present

## 2019-05-05 DIAGNOSIS — O09529 Supervision of elderly multigravida, unspecified trimester: Secondary | ICD-10-CM | POA: Diagnosis not present

## 2019-05-05 LAB — POCT URINALYSIS DIPSTICK OB
Bilirubin, UA: NEGATIVE
Blood, UA: NEGATIVE
Glucose, UA: NEGATIVE
Ketones, UA: NEGATIVE
Leukocytes, UA: NEGATIVE
Nitrite, UA: NEGATIVE
Spec Grav, UA: 1.01 (ref 1.010–1.025)
Urobilinogen, UA: 0.2 E.U./dL
pH, UA: 6.5 (ref 5.0–8.0)

## 2019-05-05 NOTE — Progress Notes (Signed)
ROB:  Non-compliant now transferred to MD's.  Did not come for labs.  Did not bring sugar log.  Complains of itching-bile acid salts reordered -A1c added.  Compliance with glyburide and sugar log discussed.  NST today reactive.  Growth ultrasound with AFI next week.

## 2019-05-06 LAB — HEMOGLOBIN A1C
Est. average glucose Bld gHb Est-mCnc: 126 mg/dL
Hgb A1c MFr Bld: 6 % — ABNORMAL HIGH (ref 4.8–5.6)

## 2019-05-08 ENCOUNTER — Inpatient Hospital Stay: Payer: Medicaid Other | Admitting: Anesthesiology

## 2019-05-08 ENCOUNTER — Inpatient Hospital Stay
Admission: EM | Admit: 2019-05-08 | Discharge: 2019-05-10 | DRG: 797 | Disposition: A | Discharge status: Home / Self Care | Payer: Medicaid Other | Attending: Obstetrics and Gynecology | Admitting: Obstetrics and Gynecology

## 2019-05-08 ENCOUNTER — Other Ambulatory Visit: Payer: Self-pay

## 2019-05-08 ENCOUNTER — Encounter: Payer: Self-pay | Admitting: Obstetrics and Gynecology

## 2019-05-08 DIAGNOSIS — O24425 Gestational diabetes mellitus in childbirth, controlled by oral hypoglycemic drugs: Secondary | ICD-10-CM | POA: Diagnosis not present

## 2019-05-08 DIAGNOSIS — O10013 Pre-existing essential hypertension complicating pregnancy, third trimester: Secondary | ICD-10-CM

## 2019-05-08 DIAGNOSIS — Z302 Encounter for sterilization: Secondary | ICD-10-CM

## 2019-05-08 DIAGNOSIS — O24419 Gestational diabetes mellitus in pregnancy, unspecified control: Secondary | ICD-10-CM | POA: Diagnosis present

## 2019-05-08 DIAGNOSIS — O24415 Gestational diabetes mellitus in pregnancy, controlled by oral hypoglycemic drugs: Secondary | ICD-10-CM

## 2019-05-08 DIAGNOSIS — Z3A36 36 weeks gestation of pregnancy: Secondary | ICD-10-CM | POA: Diagnosis not present

## 2019-05-08 DIAGNOSIS — Z8619 Personal history of other infectious and parasitic diseases: Secondary | ICD-10-CM | POA: Diagnosis not present

## 2019-05-08 DIAGNOSIS — Z8759 Personal history of other complications of pregnancy, childbirth and the puerperium: Secondary | ICD-10-CM | POA: Diagnosis not present

## 2019-05-08 DIAGNOSIS — O42013 Preterm premature rupture of membranes, onset of labor within 24 hours of rupture, third trimester: Secondary | ICD-10-CM | POA: Diagnosis not present

## 2019-05-08 DIAGNOSIS — O1002 Pre-existing essential hypertension complicating childbirth: Secondary | ICD-10-CM | POA: Diagnosis not present

## 2019-05-08 DIAGNOSIS — O09299 Supervision of pregnancy with other poor reproductive or obstetric history, unspecified trimester: Secondary | ICD-10-CM

## 2019-05-08 DIAGNOSIS — Z20822 Contact with and (suspected) exposure to covid-19: Secondary | ICD-10-CM | POA: Diagnosis not present

## 2019-05-08 DIAGNOSIS — Z87891 Personal history of nicotine dependence: Secondary | ICD-10-CM

## 2019-05-08 DIAGNOSIS — Z8659 Personal history of other mental and behavioral disorders: Secondary | ICD-10-CM

## 2019-05-08 DIAGNOSIS — Z880 Allergy status to penicillin: Secondary | ICD-10-CM

## 2019-05-08 DIAGNOSIS — O42913 Preterm premature rupture of membranes, unspecified as to length of time between rupture and onset of labor, third trimester: Principal | ICD-10-CM | POA: Diagnosis present

## 2019-05-08 DIAGNOSIS — O09899 Supervision of other high risk pregnancies, unspecified trimester: Secondary | ICD-10-CM

## 2019-05-08 DIAGNOSIS — O169 Unspecified maternal hypertension, unspecified trimester: Secondary | ICD-10-CM | POA: Diagnosis present

## 2019-05-08 DIAGNOSIS — Z641 Problems related to multiparity: Secondary | ICD-10-CM

## 2019-05-08 LAB — GLUCOSE, CAPILLARY
Glucose-Capillary: 143 mg/dL — ABNORMAL HIGH (ref 70–99)
Glucose-Capillary: 156 mg/dL — ABNORMAL HIGH (ref 70–99)
Glucose-Capillary: 85 mg/dL (ref 70–99)
Glucose-Capillary: 94 mg/dL (ref 70–99)

## 2019-05-08 LAB — TYPE AND SCREEN
ABO/RH(D): O POS
Antibody Screen: NEGATIVE

## 2019-05-08 LAB — CHLAMYDIA/NGC RT PCR (ARMC ONLY)
Chlamydia Tr: NOT DETECTED
N gonorrhoeae: NOT DETECTED

## 2019-05-08 LAB — URINE DRUG SCREEN, QUALITATIVE (ARMC ONLY)
Amphetamines, Ur Screen: NOT DETECTED
Barbiturates, Ur Screen: NOT DETECTED
Benzodiazepine, Ur Scrn: NOT DETECTED
Cannabinoid 50 Ng, Ur ~~LOC~~: NOT DETECTED
Cocaine Metabolite,Ur ~~LOC~~: NOT DETECTED
MDMA (Ecstasy)Ur Screen: NOT DETECTED
Methadone Scn, Ur: NOT DETECTED
Opiate, Ur Screen: NOT DETECTED
Phencyclidine (PCP) Ur S: NOT DETECTED
Tricyclic, Ur Screen: NOT DETECTED

## 2019-05-08 LAB — GROUP B STREP BY PCR: Group B strep by PCR: NEGATIVE

## 2019-05-08 LAB — CBC
HCT: 35.9 % — ABNORMAL LOW (ref 36.0–46.0)
Hemoglobin: 12.1 g/dL (ref 12.0–15.0)
MCH: 30.2 pg (ref 26.0–34.0)
MCHC: 33.7 g/dL (ref 30.0–36.0)
MCV: 89.5 fL (ref 80.0–100.0)
Platelets: 258 10*3/uL (ref 150–400)
RBC: 4.01 MIL/uL (ref 3.87–5.11)
RDW: 14 % (ref 11.5–15.5)
WBC: 13.1 10*3/uL — ABNORMAL HIGH (ref 4.0–10.5)
nRBC: 0 % (ref 0.0–0.2)

## 2019-05-08 LAB — RESPIRATORY PANEL BY RT PCR (FLU A&B, COVID)
Influenza A by PCR: NEGATIVE
Influenza B by PCR: NEGATIVE
SARS Coronavirus 2 by RT PCR: NEGATIVE

## 2019-05-08 MED ORDER — LIDOCAINE HCL (PF) 1 % IJ SOLN
INTRAMUSCULAR | Status: AC
  Filled 2019-05-08: qty 30

## 2019-05-08 MED ORDER — BENZOCAINE-MENTHOL 20-0.5 % EX AERO: 1.0000 "application " | INHALATION_SPRAY | CUTANEOUS | Status: DC | PRN

## 2019-05-08 MED ORDER — FENTANYL 2.5 MCG/ML W/ROPIVACAINE 0.15% IN NS 100 ML EPIDURAL (ARMC)
EPIDURAL | Status: AC
  Filled 2019-05-08: qty 100

## 2019-05-08 MED ORDER — OXYTOCIN BOLUS FROM INFUSION
500.0000 mL | Freq: Once | INTRAVENOUS | Status: AC
  Administered 2019-05-08: 500 mL via INTRAVENOUS

## 2019-05-08 MED ORDER — SENNOSIDES-DOCUSATE SODIUM 8.6-50 MG PO TABS
2.0000 | ORAL_TABLET | ORAL | Status: DC
  Administered 2019-05-08 – 2019-05-09 (×2): 2 via ORAL
  Filled 2019-05-08 (×2): qty 2

## 2019-05-08 MED ORDER — ONDANSETRON HCL 4 MG/2ML IJ SOLN
4.0000 mg | INTRAMUSCULAR | Status: DC | PRN
  Administered 2019-05-09: 4 mg via INTRAVENOUS

## 2019-05-08 MED ORDER — PHENYLEPHRINE 40 MCG/ML (10ML) SYRINGE FOR IV PUSH (FOR BLOOD PRESSURE SUPPORT)
80.0000 ug | PREFILLED_SYRINGE | INTRAVENOUS | Status: DC | PRN
  Filled 2019-05-08: qty 10

## 2019-05-08 MED ORDER — OXYCODONE-ACETAMINOPHEN 5-325 MG PO TABS: 1.0000 | ORAL_TABLET | ORAL | Status: DC | PRN

## 2019-05-08 MED ORDER — MISOPROSTOL 200 MCG PO TABS
ORAL_TABLET | ORAL | Status: AC
  Administered 2019-05-08: 800 ug
  Filled 2019-05-08: qty 4

## 2019-05-08 MED ORDER — LABETALOL HCL 5 MG/ML IV SOLN: 40.0000 mg | INTRAVENOUS | Status: DC | PRN

## 2019-05-08 MED ORDER — METOCLOPRAMIDE HCL 10 MG PO TABS
10.0000 mg | ORAL_TABLET | Freq: Once | ORAL | Status: AC
  Administered 2019-05-09: 10 mg via ORAL
  Filled 2019-05-08: qty 1

## 2019-05-08 MED ORDER — FERROUS SULFATE 325 (65 FE) MG PO TABS
325.0000 mg | ORAL_TABLET | Freq: Two times a day (BID) | ORAL | Status: DC
  Administered 2019-05-09 – 2019-05-10 (×2): 325 mg via ORAL
  Filled 2019-05-08 (×2): qty 1

## 2019-05-08 MED ORDER — WITCH HAZEL-GLYCERIN EX PADS: 1.0000 "application " | MEDICATED_PAD | CUTANEOUS | Status: DC | PRN

## 2019-05-08 MED ORDER — DIBUCAINE (PERIANAL) 1 % EX OINT: 1.0000 "application " | TOPICAL_OINTMENT | CUTANEOUS | Status: DC | PRN

## 2019-05-08 MED ORDER — FENTANYL 2.5 MCG/ML W/ROPIVACAINE 0.15% IN NS 100 ML EPIDURAL (ARMC)
12.0000 mL/h | EPIDURAL | Status: DC
  Administered 2019-05-08: 12 mL/h via EPIDURAL

## 2019-05-08 MED ORDER — EPHEDRINE 5 MG/ML INJ
10.0000 mg | INTRAVENOUS | Status: DC | PRN
  Filled 2019-05-08: qty 2

## 2019-05-08 MED ORDER — NIFEDIPINE 10 MG PO CAPS
10.0000 mg | ORAL_CAPSULE | ORAL | Status: DC | PRN
  Filled 2019-05-08: qty 1

## 2019-05-08 MED ORDER — NIFEDIPINE 10 MG PO CAPS
20.0000 mg | ORAL_CAPSULE | ORAL | Status: DC | PRN
  Filled 2019-05-08: qty 2

## 2019-05-08 MED ORDER — FAMOTIDINE 20 MG PO TABS
40.0000 mg | ORAL_TABLET | Freq: Once | ORAL | Status: AC
  Administered 2019-05-09: 40 mg via ORAL
  Filled 2019-05-08: qty 2

## 2019-05-08 MED ORDER — COCONUT OIL OIL
1.0000 "application " | TOPICAL_OIL | Status: DC | PRN
  Filled 2019-05-08: qty 120

## 2019-05-08 MED ORDER — DIPHENHYDRAMINE HCL 50 MG/ML IJ SOLN: 12.5000 mg | INTRAMUSCULAR | Status: DC | PRN

## 2019-05-08 MED ORDER — LACTATED RINGERS IV SOLN: 500.0000 mL | Freq: Once | INTRAVENOUS | Status: DC

## 2019-05-08 MED ORDER — LIDOCAINE HCL (PF) 1 % IJ SOLN
INTRAMUSCULAR | Status: DC | PRN
  Administered 2019-05-08: 3 mL via SUBCUTANEOUS

## 2019-05-08 MED ORDER — LACTATED RINGERS IV SOLN: INTRAVENOUS | Status: DC

## 2019-05-08 MED ORDER — LIDOCAINE-EPINEPHRINE (PF) 1.5 %-1:200000 IJ SOLN
INTRAMUSCULAR | Status: DC | PRN
  Administered 2019-05-08: 3 mL via EPIDURAL

## 2019-05-08 MED ORDER — SOD CITRATE-CITRIC ACID 500-334 MG/5ML PO SOLN: 30.0000 mL | ORAL | Status: DC | PRN

## 2019-05-08 MED ORDER — GLYBURIDE 2.5 MG PO TABS
2.5000 mg | ORAL_TABLET | Freq: Every day | ORAL | Status: DC
  Administered 2019-05-10: 10:00:00 2.5 mg via ORAL
  Filled 2019-05-08 (×2): qty 1

## 2019-05-08 MED ORDER — GLYBURIDE 5 MG PO TABS
5.0000 mg | ORAL_TABLET | Freq: Every day | ORAL | Status: DC
  Administered 2019-05-09: 5 mg via ORAL
  Filled 2019-05-08 (×2): qty 1

## 2019-05-08 MED ORDER — LACTATED RINGERS IV SOLN: 500.0000 mL | INTRAVENOUS | Status: DC | PRN

## 2019-05-08 MED ORDER — CLINDAMYCIN PHOSPHATE 900 MG/50ML IV SOLN
900.0000 mg | Freq: Once | INTRAVENOUS | Status: AC
  Administered 2019-05-08: 900 mg via INTRAVENOUS
  Filled 2019-05-08: qty 50

## 2019-05-08 MED ORDER — SODIUM CHLORIDE 0.9 % IV SOLN
INTRAVENOUS | Status: DC | PRN
  Administered 2019-05-08 (×2): 4 mL via EPIDURAL

## 2019-05-08 MED ORDER — ONDANSETRON HCL 4 MG PO TABS: 4.0000 mg | ORAL_TABLET | ORAL | Status: DC | PRN

## 2019-05-08 MED ORDER — ACETAMINOPHEN 325 MG PO TABS: 650.0000 mg | ORAL_TABLET | ORAL | Status: DC | PRN

## 2019-05-08 MED ORDER — SODIUM CHLORIDE 0.9% FLUSH: 3.0000 mL | Freq: Two times a day (BID) | INTRAVENOUS | Status: DC

## 2019-05-08 MED ORDER — BUTORPHANOL TARTRATE 1 MG/ML IJ SOLN
1.0000 mg | INTRAMUSCULAR | Status: DC | PRN
  Administered 2019-05-08: 1 mg via INTRAVENOUS
  Filled 2019-05-08: qty 1

## 2019-05-08 MED ORDER — OXYCODONE-ACETAMINOPHEN 5-325 MG PO TABS: 2.0000 | ORAL_TABLET | ORAL | Status: DC | PRN

## 2019-05-08 MED ORDER — SODIUM CHLORIDE 0.9 % IV SOLN: 250.0000 mL | INTRAVENOUS | Status: DC | PRN

## 2019-05-08 MED ORDER — PRENATAL MULTIVITAMIN CH
1.0000 | ORAL_TABLET | Freq: Every day | ORAL | Status: DC
  Administered 2019-05-09: 1 via ORAL
  Filled 2019-05-08: qty 1

## 2019-05-08 MED ORDER — DIPHENHYDRAMINE HCL 25 MG PO CAPS: 25.0000 mg | ORAL_CAPSULE | Freq: Four times a day (QID) | ORAL | Status: DC | PRN

## 2019-05-08 MED ORDER — ACETAMINOPHEN 325 MG PO TABS
650.0000 mg | ORAL_TABLET | ORAL | Status: DC | PRN
  Administered 2019-05-08 – 2019-05-09 (×2): 650 mg via ORAL
  Filled 2019-05-08 (×2): qty 2

## 2019-05-08 MED ORDER — SIMETHICONE 80 MG PO CHEW: 80.0000 mg | CHEWABLE_TABLET | ORAL | Status: DC | PRN

## 2019-05-08 MED ORDER — ONDANSETRON HCL 4 MG/2ML IJ SOLN
4.0000 mg | Freq: Four times a day (QID) | INTRAMUSCULAR | Status: DC | PRN
  Administered 2019-05-08: 4 mg via INTRAVENOUS
  Filled 2019-05-08: qty 2

## 2019-05-08 MED ORDER — OXYTOCIN 10 UNIT/ML IJ SOLN
INTRAMUSCULAR | Status: AC
  Filled 2019-05-08: qty 2

## 2019-05-08 MED ORDER — LIDOCAINE HCL (PF) 1 % IJ SOLN: 30.0000 mL | INTRAMUSCULAR | Status: DC | PRN

## 2019-05-08 MED ORDER — SODIUM CHLORIDE 0.9% FLUSH: 3.0000 mL | INTRAVENOUS | Status: DC | PRN

## 2019-05-08 MED ORDER — OXYTOCIN 40 UNITS IN NORMAL SALINE INFUSION - SIMPLE MED
2.5000 [IU]/h | INTRAVENOUS | Status: DC
  Filled 2019-05-08: qty 1000

## 2019-05-08 MED ORDER — IBUPROFEN 600 MG PO TABS
600.0000 mg | ORAL_TABLET | Freq: Four times a day (QID) | ORAL | Status: DC
  Administered 2019-05-08 – 2019-05-10 (×7): 600 mg via ORAL
  Filled 2019-05-08 (×7): qty 1

## 2019-05-08 MED ORDER — NIFEDIPINE ER OSMOTIC RELEASE 30 MG PO TB24: 30.0000 mg | ORAL_TABLET | Freq: Every day | ORAL | Status: DC

## 2019-05-08 MED ORDER — AMMONIA AROMATIC IN INHA
RESPIRATORY_TRACT | Status: AC
  Filled 2019-05-08: qty 10

## 2019-05-08 NOTE — H&P (Signed)
Obstetric History and Physical  Diamond Sutton is a 34 y.o. [id number] with IUP at [redacted]w[redacted]d presenting with spontaneous rupture of membranes.   Patient states she has been having  irregular contractions, none vaginal bleeding, ruptured, clear fluid membranes, with active fetal movement.    Denies difficulty breathing or respiratory distress, chest pain, dysuria, and leg pain or swelling.   Prenatal Course  Source of Care: [facility]-initial visit 9 weeks, total visit: 13  Pregnancy complications or risks: Grand multiparity, History postpartum depression, Gestational diabetes, Hypertension in pregnancy  Prenatal labs and studies:  ABO, Rh: --/--/O POS (04/19 1530)  Antibody: NEG (04/19 1530)  Rubella: 1.92 (10/12 1444)  Varicella: 510 (10/12 1444)  RPR: Non Reactive (03/25 1451)   HBsAg: Negative (01/26 1141)   HIV: Non Reactive (01/26 1141)   OZH:YQMVHQIO/-- (04/19 1600)  1 hr Glucola: 170 (11/24 0917)  Gestational Glucose Tolerance: 98, 192, 190, 163 (03/02 0917)  Genetic screening: Low risk female (10/27 1559)  Anatomy US: Complete, normal (01/12 0920)  Past Medical History:  Diagnosis Date  . Anemia   . Depression 2001  . Ectopic pregnancy   . History of urinary tract infection   . Hypertension   . Ovarian cyst     Past Surgical History:  Procedure Laterality Date  . DILATION AND CURETTAGE OF UTERUS     SAB    OB History  Gravida Para Term Preterm AB Living  10 7 6 1 2 7   SAB TAB Ectopic Multiple Live Births  1 0 1 0 6    # Outcome Date GA Lbr Len/2nd Weight Sex Delivery Anes PTL Lv  10 Current           9 Ectopic 2018          8 Preterm 12/30/14 [redacted]w[redacted]d  2740 g M Vag-Spont EPI  LIV  7 Term 09/11/12 [redacted]w[redacted]d  3317 g M Vag-Spont   LIV  6 Term 08/05/09 [redacted]w[redacted]d  3118 g F Vag-Spont   LIV  5 Term 03/19/07 [redacted]w[redacted]d  3374 g M Vag-Spont   LIV  4 Term 05/05/05 [redacted]w[redacted]d  3033 g M Vag-Spont   LIV  3 SAB 2006 [redacted]w[redacted]d    SAB     2 Term 05/05/02 [redacted]w[redacted]d  3090 g M Vag-Spont   LIV   1 Term             Social History   Socioeconomic History  . Marital status: Single    Spouse name: Not on file  . Number of children: Not on file  . Years of education: Not on file  . Highest education level: Not on file  Occupational History  . Not on file  Tobacco Use  . Smoking status: Former Smoker    Packs/day: 0.25    Years: 15.00    Pack years: 3.75    Types: Cigarettes  . Smokeless tobacco: Never Used  Substance and Sexual Activity  . Alcohol use: Not Currently    Comment: Last ETOH use 10/02/2018 - counseled to not  use  . Drug use: Not Currently  . Sexual activity: Yes    Birth control/protection: Surgical    Comment: Last used Nexplanon 02/2018  Other Topics Concern  . Not on file  Social History Narrative  . Not on file   Social Determinants of Health   Financial Resource Strain:   . Difficulty of Paying Living Expenses:   Food Insecurity:   . Worried About 03/2018 in the  Last Year:   . Miner in the Last Year:   Transportation Needs:   . Film/video editor (Medical):   Marland Kitchen Lack of Transportation (Non-Medical):   Physical Activity:   . Days of Exercise per Week:   . Minutes of Exercise per Session:   Stress:   . Feeling of Stress :   Social Connections:   . Frequency of Communication with Friends and Family:   . Frequency of Social Gatherings with Friends and Family:   . Attends Religious Services:   . Active Member of Clubs or Organizations:   . Attends Archivist Meetings:   Marland Kitchen Marital Status:     Family History  Problem Relation Age of Onset  . Diabetes Mother   . Hypertension Mother   . Heart murmur Half-Brother   . Breast cancer Neg Hx   . Ovarian cancer Neg Hx   . Colon cancer Neg Hx   . Heart disease Neg Hx     Medications Prior to Admission  Medication Sig Dispense Refill Last Dose  . aspirin 81 MG EC tablet Take 1 tablet (81 mg total) by mouth daily. Swallow whole. 30 tablet 12 05/08/2019 at  Unknown time  . glucose blood test strip Test blood sugar 4 times per day and PRN. 100 each 12 05/08/2019 at Unknown time  . glyBURIDE (DIABETA) 2.5 MG tablet Take 1 tablet (2.5 mg total) by mouth daily with breakfast. 30 tablet 2 05/08/2019 at Unknown time  . Prenatal Vit-Fe Fumarate-FA (PRENATAL VITAMIN) 27-0.8 MG TABS Take 1 tablet by mouth daily at 6 (six) AM. 100 tablet 0 05/08/2019 at Unknown time  . glyBURIDE (DIABETA) 5 MG tablet Take 1 tablet (5 mg total) by mouth daily after supper. 30 tablet 1     Allergies  Allergen Reactions  . Penicillins Anaphylaxis    12/2014: received ancef w/o complications  Has patient had a PCN reaction causing immediate rash, facial/tongue/throat swelling, SOB or lightheadedness with hypotension: Yes Has patient had a PCN reaction causing severe rash involving mucus membranes or skin necrosis: Yes Has patient had a PCN reaction that required hospitalization Yes Has patient had a PCN reaction occurring within the last 10 years: No If all of the above answers are "NO", then may proceed with Cephalosporin use.     Review of Systems: Negative except for what is mentioned in HPI.  Physical Exam:  Temp:  [98.4 F (36.9 C)] 98.4 F (36.9 C) (04/19 1525) Pulse Rate:  [85-108] 108 (04/19 1731) Resp:  [20] 20 (04/19 1525) BP: (134-155)/(74-97) 144/78 (04/19 1810) SpO2:  [95 %-100 %] 99 % (04/19 1731) Weight:  [81.2 kg] 81.2 kg (04/19 1525)  GENERAL: Well-developed, well-nourished female in no acute distress.   LUNGS: Clear to auscultation bilaterally.   HEART: Regular rate and rhythm.  ABDOMEN: Soft, nontender, nondistended, gravid.  EXTREMITIES: Nontender, no edema, 2+ distal pulses.  Cervical Exam: Dilation: 10 Dilation Complete Date: 05/08/19 Dilation Complete Time: 1744 Effacement (%): 100 Cervical Position: Anterior Station: Plus 1 Presentation: Vertex Exam by:: Robin Searing, RN   FHR Category I  Contractions: Every two (2) to four (4)  minutes, soft resting tone   Pertinent Labs/Studies:    Results for orders placed or performed during the hospital encounter of 05/08/19 (from the past 24 hour(s))  Glucose, capillary     Status: None   Collection Time: 05/08/19  3:22 PM  Result Value Ref Range   Glucose-Capillary 94 70 - 99 mg/dL  Respiratory Panel by RT PCR (Flu A&B, Covid) - Nasopharyngeal Swab     Status: None   Collection Time: 05/08/19  3:30 PM   Specimen: Nasopharyngeal Swab  Result Value Ref Range   SARS Coronavirus 2 by RT PCR NEGATIVE NEGATIVE   Influenza A by PCR NEGATIVE NEGATIVE   Influenza B by PCR NEGATIVE NEGATIVE  Type and screen [facility]     Status: None   Collection Time: 05/08/19  3:30 PM  Result Value Ref Range   ABO/RH(D) O POS    Antibody Screen NEG    Sample Expiration      05/11/2019,2359 Performed at [facility], [address] [place], [place] [address]   Chlamydia/NGC rt PCR [facility] only)     Status: None   Collection Time: 05/08/19  3:30 PM   Specimen: Urine  Result Value Ref Range   Specimen source GC/Chlam URINE, RANDOM    Chlamydia Tr NOT DETECTED NOT DETECTED   N gonorrhoeae NOT DETECTED NOT DETECTED  CBC     Status: Abnormal   Collection Time: 05/08/19  3:31 PM  Result Value Ref Range   WBC 13.1 (H) 4.0 - 10.5 K/uL   RBC 4.01 3.87 - 5.11 MIL/uL   Hemoglobin 12.1 12.0 - 15.0 g/dL   HCT 73.5 (L) 32.9 - 92.4 %   MCV 89.5 80.0 - 100.0 fL   MCH 30.2 26.0 - 34.0 pg   MCHC 33.7 30.0 - 36.0 g/dL   RDW 26.8 34.1 - 96.2 %   Platelets 258 150 - 400 K/uL   nRBC 0.0 0.0 - 0.2 %  Urine Drug Screen, Qualitative ([facility] only)     Status: None   Collection Time: 05/08/19  3:36 PM  Result Value Ref Range   Tricyclic, Ur Screen NONE DETECTED NONE DETECTED   Amphetamines, Ur Screen NONE DETECTED NONE DETECTED   MDMA (Ecstasy)Ur Screen NONE DETECTED NONE DETECTED   Cocaine Metabolite,Ur [place] NONE DETECTED NONE DETECTED   Opiate, Ur Screen NONE DETECTED  NONE DETECTED   Phencyclidine (PCP) Ur S NONE DETECTED NONE DETECTED   Cannabinoid 50 Ng, Ur [place] NONE DETECTED NONE DETECTED   Barbiturates, Ur Screen NONE DETECTED NONE DETECTED   Benzodiazepine, Ur Scrn NONE DETECTED NONE DETECTED   Methadone Scn, Ur NONE DETECTED NONE DETECTED  Group B strep by PCR     Status: None   Collection Time: 05/08/19  4:00 PM   Specimen: Vaginal/Rectal; Genital  Result Value Ref Range   Group B strep by PCR NEGATIVE NEGATIVE    Assessment :  VIOLETT HOBBS is a 34 y.o. [id number] at [redacted]w[redacted]d being admitted for preterm premature rupture of membranes, Rh positive, Grand multiparity, History postpartum depression, Gestational diabetes, Hypertension in pregnancy  FHR Category 1  Plan:  Admit to birthing suites.   Labor: Expectant management.    Delivery plan: Hopeful for vaginal birth.   Dr Valentino Saxon at bedside completed ultrasound for fetal presentation.   Room prepared for second stage.    Gunnar Bulla, CNM [facility], [facility] 05/08/19 6:14 PM

## 2019-05-08 NOTE — Lactation Note (Signed)
This note was copied from a baby's chart. Lactation Consultation Note  Patient Name: Diamond Sutton [id number] Date: 05/08/2019 Reason for consult: Initial assessment;Late-preterm 34-36.6wks;Other (Comment)(Mom gestation diabetic, but BG for baby were 45 & 41)  Mom breast fed twice in birthplace with minimal assistance.  Diamond Sutton latched well and had strong, rhythmic sucking with occasional swallows.  Demonstrated hand expression.  Mom denies any nipple or breast pain while nursing.  Mom is a gestational diabetic, but Diamond Sutton's blood glucoses were 45 and 51.  Mom reports breast feeding her other 7 around a month.  Mom's support person is her sister.  Reviewed feeding cues, normal newborn stomach size, supply and demand, normal course of lactation and routine newborn feeding patterns.   Maternal Data Formula Feeding for Exclusion: No Has patient been taught Hand Expression?: Yes Does the patient have breastfeeding experience prior to this delivery?: Yes  Feeding Feeding Type: Bottle Fed - Formula Nipple Type: Slow - flow  LATCH Score Latch: Grasps breast easily, tongue down, lips flanged, rhythmical sucking.  Audible Swallowing: Spontaneous and intermittent  Type of Nipple: Everted at rest and after stimulation  Comfort (Breast/Nipple): Soft / non-tender  Hold (Positioning): No assistance needed to correctly position infant at breast.  LATCH Score: 10  Interventions Interventions: Breast feeding basics reviewed;Assisted with latch;Skin to skin;Breast massage;Breast compression;Support pillows;Position options  Lactation Tools Discussed/Used     Consult Status Consult Status: PRN    Louis Meckel 05/08/2019, 9:20 PM

## 2019-05-08 NOTE — Anesthesia Preprocedure Evaluation (Signed)
Anesthesia Evaluation  Patient identified by MRN, date of birth, ID band Patient awake    Reviewed: Allergy & Precautions, Patient's Chart, lab work & pertinent test results  History of Anesthesia Complications Negative for: history of anesthetic complications  Airway Mallampati: III       Dental   Pulmonary former smoker,           Cardiovascular hypertension, negative cardio ROS       Neuro/Psych PSYCHIATRIC DISORDERS Depression negative neurological ROS     GI/Hepatic negative GI ROS, Neg liver ROS,   Endo/Other  diabetes, Gestational  Renal/GU negative Renal ROS     Musculoskeletal   Abdominal   Peds  Hematology  (+) Blood dyscrasia, anemia ,   Anesthesia Other Findings   Reproductive/Obstetrics (+) Pregnancy                             Anesthesia Physical  Anesthesia Plan  ASA: II  Anesthesia Plan: Epidural   Post-op Pain Management:    Induction:   PONV Risk Score and Plan:   Airway Management Planned:   Additional Equipment:   Intra-op Plan:   Post-operative Plan:   Informed Consent: I have reviewed the patients History and Physical, chart, labs and discussed the procedure including the risks, benefits and alternatives for the proposed anesthesia with the patient or authorized representative who has indicated his/her understanding and acceptance.       Plan Discussed with: Anesthesiologist  Anesthesia Plan Comments:         Anesthesia Quick Evaluation

## 2019-05-08 NOTE — Anesthesia Procedure Notes (Signed)
Epidural Patient location during procedure: OB Start time: 05/08/2019 4:32 PM End time: 05/08/2019 5:00 PM  Staffing Anesthesiologist: Corinda Gubler, MD Resident/CRNA: Irving Burton, CRNA Performed: resident/CRNA   Preanesthetic Checklist Completed: patient identified, IV checked, site marked, risks and benefits discussed, surgical consent, monitors and equipment checked and pre-op evaluation  Epidural Patient position: sitting Prep: ChloraPrep and site prepped and draped Patient monitoring: heart rate, continuous pulse ox and blood pressure Approach: midline Location: L3-L4 Injection technique: LOR saline  Needle:  Needle type: Tuohy  Needle gauge: 17 G Needle length: 9 cm Needle insertion depth: 8 cm Catheter type: closed end flexible Catheter size: 19 Gauge Catheter at skin depth: 13 cm Test dose: negative and 1.5% lidocaine with Epi 1:200 K  Assessment Events: blood not aspirated, injection not painful, no injection resistance, no paresthesia and negative IV test  Additional Notes Reason for block:procedure for pain

## 2019-05-09 ENCOUNTER — Encounter
Admission: EM | Disposition: A | Discharge status: Home / Self Care | Payer: Self-pay | Source: Home / Self Care | Attending: Obstetrics and Gynecology

## 2019-05-09 ENCOUNTER — Inpatient Hospital Stay: Payer: Medicaid Other | Admitting: Anesthesiology

## 2019-05-09 ENCOUNTER — Encounter: Payer: Self-pay | Admitting: Obstetrics and Gynecology

## 2019-05-09 ENCOUNTER — Other Ambulatory Visit: Payer: Medicaid Other

## 2019-05-09 DIAGNOSIS — Z302 Encounter for sterilization: Secondary | ICD-10-CM | POA: Diagnosis not present

## 2019-05-09 HISTORY — PX: TUBAL LIGATION: SHX77

## 2019-05-09 LAB — CBC
HCT: 33.3 % — ABNORMAL LOW (ref 36.0–46.0)
Hemoglobin: 11.2 g/dL — ABNORMAL LOW (ref 12.0–15.0)
MCH: 30.4 pg (ref 26.0–34.0)
MCHC: 33.6 g/dL (ref 30.0–36.0)
MCV: 90.2 fL (ref 80.0–100.0)
Platelets: 236 10*3/uL (ref 150–400)
RBC: 3.69 MIL/uL — ABNORMAL LOW (ref 3.87–5.11)
RDW: 14 % (ref 11.5–15.5)
WBC: 16.5 10*3/uL — ABNORMAL HIGH (ref 4.0–10.5)
nRBC: 0 % (ref 0.0–0.2)

## 2019-05-09 LAB — COMPREHENSIVE METABOLIC PANEL
ALT: 13 U/L (ref 0–44)
AST: 19 U/L (ref 15–41)
Albumin: 2.8 g/dL — ABNORMAL LOW (ref 3.5–5.0)
Alkaline Phosphatase: 147 U/L — ABNORMAL HIGH (ref 38–126)
Anion gap: 6 (ref 5–15)
BUN: 8 mg/dL (ref 6–20)
CO2: 21 mmol/L — ABNORMAL LOW (ref 22–32)
Calcium: 8.8 mg/dL — ABNORMAL LOW (ref 8.9–10.3)
Chloride: 111 mmol/L (ref 98–111)
Creatinine, Ser: 0.49 mg/dL (ref 0.44–1.00)
GFR calc Af Amer: >60 mL/min (ref >60)
GFR calc non Af Amer: >60 mL/min (ref >60)
Glucose, Bld: 137 mg/dL — ABNORMAL HIGH (ref 70–99)
Potassium: 3.5 mmol/L (ref 3.5–5.1)
Sodium: 138 mmol/L (ref 135–145)
Total Bilirubin: 0.6 mg/dL (ref 0.3–1.2)
Total Protein: 5.9 g/dL — ABNORMAL LOW (ref 6.5–8.1)

## 2019-05-09 LAB — GLUCOSE, CAPILLARY
Glucose-Capillary: 122 mg/dL — ABNORMAL HIGH (ref 70–99)
Glucose-Capillary: 92 mg/dL (ref 70–99)
Glucose-Capillary: 66 mg/dL — ABNORMAL LOW (ref 70–99)
Glucose-Capillary: 95 mg/dL (ref 70–99)

## 2019-05-09 LAB — RPR: RPR Ser Ql: NONREACTIVE

## 2019-05-09 LAB — VITAMIN D 25 HYDROXY (VIT D DEFICIENCY, FRACTURES): Vit D, 25-Hydroxy: 18.29 ng/mL — ABNORMAL LOW (ref 30–100)

## 2019-05-09 SURGERY — LIGATION, FALLOPIAN TUBE, POSTPARTUM
Anesthesia: Spinal

## 2019-05-09 MED ORDER — ONDANSETRON HCL 4 MG/2ML IJ SOLN
INTRAMUSCULAR | Status: AC
  Filled 2019-05-09: qty 2

## 2019-05-09 MED ORDER — BUPIVACAINE HCL 0.5 % IJ SOLN
INTRAMUSCULAR | Status: DC | PRN
  Administered 2019-05-09: 10 mL

## 2019-05-09 MED ORDER — BUPIVACAINE HCL (PF) 0.5 % IJ SOLN
INTRAMUSCULAR | Status: AC
  Filled 2019-05-09: qty 30

## 2019-05-09 MED ORDER — FENTANYL CITRATE (PF) 100 MCG/2ML IJ SOLN
INTRAMUSCULAR | Status: DC | PRN
  Administered 2019-05-09 (×2): 50 ug via INTRAVENOUS

## 2019-05-09 MED ORDER — MIDAZOLAM HCL 2 MG/2ML IJ SOLN
INTRAMUSCULAR | Status: AC
  Filled 2019-05-09: qty 2

## 2019-05-09 MED ORDER — BUPIVACAINE IN DEXTROSE 0.75-8.25 % IT SOLN
INTRATHECAL | Status: DC | PRN
  Administered 2019-05-09: 2 mL via INTRATHECAL

## 2019-05-09 MED ORDER — MIDAZOLAM HCL 5 MG/5ML IJ SOLN
INTRAMUSCULAR | Status: DC | PRN
  Administered 2019-05-09: 2 mg via INTRAVENOUS

## 2019-05-09 MED ORDER — NIFEDIPINE ER OSMOTIC RELEASE 30 MG PO TB24
30.0000 mg | ORAL_TABLET | Freq: Every day | ORAL | Status: DC
  Administered 2019-05-09: 30 mg via ORAL
  Filled 2019-05-09: qty 1

## 2019-05-09 MED ORDER — ONDANSETRON HCL 4 MG/2ML IJ SOLN: 4.0000 mg | Freq: Once | INTRAMUSCULAR | Status: DC | PRN

## 2019-05-09 MED ORDER — HYDROCODONE-ACETAMINOPHEN 5-325 MG PO TABS
1.0000 | ORAL_TABLET | Freq: Four times a day (QID) | ORAL | Status: DC | PRN
  Administered 2019-05-09: 2 via ORAL
  Administered 2019-05-09 (×2): 1 via ORAL
  Administered 2019-05-10: 05:00:00 2 via ORAL
  Filled 2019-05-09 (×2): qty 2
  Filled 2019-05-09 (×2): qty 1

## 2019-05-09 MED ORDER — FENTANYL CITRATE (PF) 100 MCG/2ML IJ SOLN
INTRAMUSCULAR | Status: AC
  Filled 2019-05-09: qty 2

## 2019-05-09 MED ORDER — FENTANYL CITRATE (PF) 100 MCG/2ML IJ SOLN: 25.0000 ug | INTRAMUSCULAR | Status: DC | PRN

## 2019-05-09 SURGICAL SUPPLY — 30 items
ADH SKN CLS APL DERMABOND .7 (GAUZE/BANDAGES/DRESSINGS) ×1
APL PRP STRL LF DISP 70% ISPRP (MISCELLANEOUS) ×1
BLADE SURG SZ11 CARB STEEL (BLADE) ×3 IMPLANT
CHLORAPREP W/TINT 26 (MISCELLANEOUS) ×3 IMPLANT
COVER WAND RF STERILE (DRAPES) IMPLANT
DERMABOND ADVANCED (GAUZE/BANDAGES/DRESSINGS) ×2
DERMABOND ADVANCED .7 DNX12 (GAUZE/BANDAGES/DRESSINGS) ×1 IMPLANT
DRAPE LAPAROTOMY 100X77 ABD (DRAPES) ×3 IMPLANT
GLOVE BIO SURGEON STRL SZ 6.5 (GLOVE) ×6 IMPLANT
GLOVE BIO SURGEONS STRL SZ 6.5 (GLOVE) ×5
GLOVE INDICATOR 7.0 STRL GRN (GLOVE) ×11 IMPLANT
GOWN STRL REUS W/ TWL LRG LVL3 (GOWN DISPOSABLE) ×2 IMPLANT
GOWN STRL REUS W/TWL LRG LVL3 (GOWN DISPOSABLE) ×6
HANDLE SUCTION POOLE (INSTRUMENTS) IMPLANT
KIT TURNOVER CYSTO (KITS) ×3 IMPLANT
NDL HYPO 25GX1X1/2 BEV (NEEDLE) ×1 IMPLANT
NEEDLE HYPO 25GX1X1/2 BEV (NEEDLE) ×3 IMPLANT
NS IRRIG 500ML POUR BTL (IV SOLUTION) ×3 IMPLANT
PACK BASIN MINOR (MISCELLANEOUS) ×3 IMPLANT
SUCTION POOLE HANDLE (INSTRUMENTS) ×3
SUT MNCRL 4-0 (SUTURE) ×3
SUT MNCRL 4-0 27XMFL (SUTURE) ×1
SUT PLAIN GUT 0 (SUTURE) ×6 IMPLANT
SUT VIC AB 0 CT1 36 (SUTURE) IMPLANT
SUT VIC AB 0 SH 27 (SUTURE) IMPLANT
SUT VIC AB 3-0 SH 27 (SUTURE) ×3
SUT VIC AB 3-0 SH 27X BRD (SUTURE) ×1 IMPLANT
SUT VICRYL 0 AB UR-6 (SUTURE) ×6 IMPLANT
SUTURE MNCRL 4-0 27XMF (SUTURE) ×1 IMPLANT
SYR 10ML LL (SYRINGE) ×3 IMPLANT

## 2019-05-09 NOTE — Lactation Note (Signed)
This note was copied from a baby's chart. Lactation Consultation Note  Patient Name: Diamond Sutton [id number] Date: 05/09/2019 Reason for consult: Follow-up assessment  [facility] student entered room for follow-up assessment. Nursing parent and support person both present. Infant napping, nursing parent awake holding infant.   Nursing parent reports that infant just had a bottle of formula about 1 hour before [facility] student entered into room. Nursing parent plans to feed infant alternating between breast and bottle. Nursing parent reports feeling pain while breastfeeding and that nipples are feeling tender. [facility] student educated nursing parent on having a deep latch at every feed and how to help with nipple tenderness. [facility] student got coconut oil for nursing parent to use, and mentioned the use of comfort gels if the coconut oil does not suffice.   [facility] student advised patient to call out when infant is ready to nurse at next feed to be able to observe a nursing session. Nursing parent agreed. [facility] student to follow-up.   Maternal Data Does the patient have breastfeeding experience prior to this delivery?: Yes  Feeding Feeding Type: Bottle Fed - Formula Nipple Type: Slow - flow  LATCH Score                   Interventions Interventions: Coconut oil  Lactation Tools Discussed/Used     Consult Status Consult Status: Follow-up Date: 05/09/19 Follow-up type: In-patient    Jimmye Norman 05/09/2019, 9:33 AM

## 2019-05-09 NOTE — Progress Notes (Signed)
Patient ID: Diamond Sutton, female   DOB: 08-03-1985, 34 y.o.   MRN: [id number]  Post Partum Day # 1, s/p spontaneous vaginal birth, Rh positive, GBS negative, Chronic hypertension, Grand multiparity-desires permanent sterilization  Subjective:  Patient sitting in bed, holding infant. No questions. Reports mild nipple soreness, states lactation is coming to observe next feeding.   Denies difficulty breathing or respiratory distress, chest pain, abdominal pain, excessive vaginal bleeding, dysuria, and leg pain or swelling.   Objective: Temp:  [97.5 F (36.4 C)-98.9 F (37.2 C)] 98.7 F (37.1 C) (04/20 0745) Pulse Rate:  [82-108] 82 (04/20 0745) Resp:  [18-20] 20 (04/20 0745) BP: (119-155)/(59-97) 125/88 (04/20 0745) SpO2:  [94 %-100 %] 100 % (04/20 0745) Weight:  [81.2 kg] 81.2 kg (04/19 1525)  Physical Exam:   General: alert and cooperative   Lungs: clear to auscultation bilaterally  Breasts: normal appearance, no masses or tenderness  Heart: normal apical impulse  Abdomen: soft, non-tender; bowel sounds normal; no masses,  no organomegaly  Pelvis: Lochia: appropriate, Uterine Fundus: firm  Extremities: DVT Evaluation: No evidence of DVT seen on physical exam.  Recent Labs    05/08/19 1531 05/09/19 0456  HGB 12.1 11.2*  HCT 35.9* 33.3*    Assessment:  34 year old, G10P8, Post Partum Day # 1, s/p spontaneous vaginal birth, Rh positive, GBS negative, Chronic hypertension, Grand multiparity-desires permanent sterilization   Breastfeeding with formula supplementation  Plan:  Bilateral Tubal Ligation with Dr Valentino Saxon scheduled at noon today.   Routine postpartum education and care.   Reviewed red flag symptoms and when to call.   Continue orders as written. Reassess as needed.   Anticipate discharge tomorrow.    LOS: 1 day   Gunnar Bulla, CNM [facility], [facility] 05/09/2019 10:40 AM

## 2019-05-09 NOTE — Progress Notes (Signed)
OBSTETRICS AND GYNECOLOGY PRE-OPERATIVE NOTE   Pre-Op Diagnosis: Multiparity, desiring permanent sterilization  Planned Procedure: Postpartum tubal ligation  Surgeons: Hildred Laser, MD  Anesthesia: General  Blood: Type and screened  Exam:  Blood pressure 125/88, pulse 82, temperature 98.7 F (37.1 C), temperature source Oral, resp. rate 20, height 5\' 6"  (1.676 m), weight 81.2 kg, last menstrual period 08/25/2018, SpO2 100 %, unknown if currently breastfeeding. Gen App: No acute distress CVS: normal rate, regular rhythm, normal S1, S2, no murmurs, rubs, clicks or gallops. Lungs:Normal respiratory effort, chest expands symmetrically. Lungs are clear to auscultation, no crackles or wheezes. Abdomen: soft, non-tender.  Fundus 1 cm below umbilicus, firm.  Pelvis: deferred  Labs:  CBC Latest Ref Rng & Units 05/09/2019 05/08/2019 04/13/2019  WBC 4.0 - 10.5 K/uL 16.5(H) 13.1(H) 11.6(H)  Hemoglobin 12.0 - 15.0 g/dL 11.2(L) 12.1 11.6  Hematocrit 36.0 - 46.0 % 33.3(L) 35.9(L) 34.8  Platelets 150 - 400 K/uL 236 258 249    Lab Results  Component Value Date   ABORH O POS 05/08/2019    Antibiotics: None Needed   Consent: Signed and on chart   Pre-Op BTL Consent Consent: Surgical consent and tubal sterilization. Alternatives to sterilizations are documented on the surgical consent that has been signed by the patient. Patient confirms that she has been counseled about permanent sterilization on mutiple occasions during her antepartum course and during this admission. She understands the alternatives to permanents include: oral contraceptive pills, depot provera, patch, ring, intrauterine device (5 year or 10 year), Implanon, condoms and cervical caps/diaphram.  She states that she desires the procedure.  Medicaid sterilization form in chart, signed.    05/10/2019, MD [facility]

## 2019-05-09 NOTE — Transfer of Care (Signed)
Immediate Anesthesia Transfer of Care Note  Patient: Diamond Sutton  Procedure(s) Performed: POST PARTUM TUBAL LIGATION (N/A )  Patient Location: PACU  Anesthesia Type:Spinal  Level of Consciousness: awake and alert   Airway & Oxygen Therapy: Patient Spontanous Breathing  Post-op Assessment: Report given to RN and Post -op Vital signs reviewed and stable  Post vital signs: Reviewed and stable  Last Vitals:  Vitals Value Taken Time  BP 123/81 05/09/19 1352  Temp    Pulse 74 05/09/19 1352  Resp 19 05/09/19 1352  SpO2 100 % 05/09/19 1352  Vitals shown include unvalidated device data.  Last Pain:  Vitals:   05/09/19 1144  TempSrc: Oral  PainSc: 1          Complications: No apparent anesthesia complications

## 2019-05-09 NOTE — Anesthesia Postprocedure Evaluation (Signed)
Anesthesia Post Note  Patient: Diamond Sutton  Procedure(s) Performed: AN AD HOC LABOR EPIDURAL  Patient location during evaluation: Mother Baby Anesthesia Type: Epidural Level of consciousness: oriented and awake and alert Pain management: pain level controlled Vital Signs Assessment: post-procedure vital signs reviewed and stable Respiratory status: spontaneous breathing and respiratory function stable Cardiovascular status: blood pressure returned to baseline and stable Postop Assessment: no headache, no backache, no apparent nausea or vomiting and able to ambulate Anesthetic complications: no     Last Vitals:  Vitals:   05/08/19 2346 05/09/19 0416  BP: 132/90 125/88  Pulse: 89 86  Resp:  18  Temp: (!) 36.4 C 36.9 C  SpO2: 94% 100%    Last Pain:  Vitals:   05/09/19 0416  TempSrc: Oral  PainSc:                  Diamond Sutton

## 2019-05-09 NOTE — Anesthesia Procedure Notes (Signed)
Procedure Name: MAC Performed by: Ellanora Rayborn, CRNA Pre-anesthesia Checklist: Patient identified, Emergency Drugs available, Suction available, Patient being monitored and Timeout performed Oxygen Delivery Method: Nasal cannula       

## 2019-05-09 NOTE — Anesthesia Preprocedure Evaluation (Signed)
Anesthesia Evaluation  Patient identified by MRN, date of birth, ID band Patient awake    Reviewed: Allergy & Precautions, NPO status , Patient's Chart, lab work & pertinent test results  History of Anesthesia Complications Negative for: history of anesthetic complications  Airway Mallampati: II  TM Distance: >3 FB Neck ROM: Full    Dental  (+) Poor Dentition   Pulmonary neg pulmonary ROS, neg sleep apnea, neg COPD, former smoker,    breath sounds clear to auscultation- rhonchi (-) wheezing      Cardiovascular Exercise Tolerance: Good hypertension, Pt. on medications (-) CAD, (-) Past MI, (-) Cardiac Stents and (-) CABG  Rhythm:Regular Rate:Normal - Systolic murmurs and - Diastolic murmurs    Neuro/Psych neg Seizures PSYCHIATRIC DISORDERS Depression negative neurological ROS     GI/Hepatic negative GI ROS, Neg liver ROS,   Endo/Other  diabetes, Gestational  Renal/GU negative Renal ROS     Musculoskeletal negative musculoskeletal ROS (+)   Abdominal (+) - obese,   Peds  Hematology  (+) anemia ,   Anesthesia Other Findings Past Medical History: No date: Anemia 2001: Depression No date: Ectopic pregnancy No date: History of urinary tract infection No date: Hypertension No date: Ovarian cyst   Reproductive/Obstetrics (+) Breast feeding                              Lab Results  Component Value Date   WBC 16.5 (H) 05/09/2019   HGB 11.2 (L) 05/09/2019   HCT 33.3 (L) 05/09/2019   MCV 90.2 05/09/2019   PLT 236 05/09/2019    Anesthesia Physical Anesthesia Plan  ASA: II  Anesthesia Plan: Spinal   Post-op Pain Management:    Induction:   PONV Risk Score and Plan: 2 and Ondansetron and Midazolam  Airway Management Planned: Natural Airway  Additional Equipment:   Intra-op Plan:   Post-operative Plan:   Informed Consent: I have reviewed the patients History and Physical,  chart, labs and discussed the procedure including the risks, benefits and alternatives for the proposed anesthesia with the patient or authorized representative who has indicated his/her understanding and acceptance.     Dental advisory given  Plan Discussed with: CRNA and Anesthesiologist  Anesthesia Plan Comments:         Anesthesia Quick Evaluation

## 2019-05-09 NOTE — Anesthesia Procedure Notes (Addendum)
Spinal  Patient location during procedure: OR Staffing Performed: anesthesiologist  Anesthesiologist: Emmie Niemann, MD Preanesthetic Checklist Completed: patient identified, IV checked, site marked, risks and benefits discussed, surgical consent, monitors and equipment checked, pre-op evaluation and timeout performed Spinal Block Patient position: sitting Prep: Betadine and ChloraPrep Patient monitoring: heart rate, cardiac monitor, continuous pulse ox and blood pressure Approach: midline Location: L2-3 Injection technique: single-shot Needle Needle type: Introducer and Whitacre  Needle gauge: 25 G Needle length: 9 cm Assessment Sensory level: T4 Additional Notes Negative paresthesia. Negative blood return. Positive free-flowing CSF. Expiration date of kit checked and confirmed. Patient tolerated procedure well, without complications.

## 2019-05-09 NOTE — Progress Notes (Signed)
Leaving [facility] for PP tubal. Sister in room with infant.

## 2019-05-09 NOTE — Op Note (Signed)
Procedure(s): POST PARTUM TUBAL LIGATION Procedure Note  Diamond Sutton female 34 y.o. 05/09/2019  Indications: The patient is a 34 y.o. [id number] female with undesired fertility. PPD#1 s/p NSVD.  Pre-operative Diagnosis: Multiparous female, postpartum s/p SVD, with undesired fertility.   Post-operative Diagnosis: Same  Surgeon: Hildred Laser, MD  Assistants: Theron Arista, [place] PA-S.  Anesthesia: Spinal anesthesia  Findings: Normal fallopian tubes and ovaries. Uterus 1 cm below umbilicus.   Procedure Details: The patient was seen in the Holding Room. The risks, benefits, complications, treatment options, and expected outcomes were discussed with the patient.  The patient concurred with the proposed plan, giving informed consent.  The site of surgery properly noted/marked. The patient was taken to the Operating Room, identified as [name] and the procedure verified as Procedure(s) (LRB): POST PARTUM TUBAL LIGATION (N/A). A Time Out was held and the above information confirmed.    The patient was taken to the operating room where her epidural anesthesia was dosed up to surgical level and found to be adequate.  She was then placed in the dorsal supine position and prepped and draped in sterile fashion.  After an adequate timeout was performed, attention was turned to the patient's abdomen where a small transverse skin incision was made under the umbilical fold. The incision was taken down to the layer of fascia using the scalpel, and fascia was incised, and extended bilaterally using Mayo scissors. The peritoneum was entered in a sharp fashion. Attention was then turned to the fallopian tubes, and where the patient's right fallopian tube was identified and grasped with a Babcock clamp.  The tube was then followed out to the vimbria.  The Babcock clamp was then used to grasp the tube approximately 4 cm from the cornual region.  A 3 cm segment of tube was then ligated with a free tie  of 0-Chromic using the Parklanad method and excised.  The left fallopian tube was then ligated in a similar fashion and excised. The tubal lumens were cauterized bilaterally.  Good hemostasis was noted with bilateral fallopian tubes. The uterus was then returned to the abdomen.   The instruments were then removed from the patient's abdomen and the fascial incision was repaired with 0 Vicryl, and the skin was closed with a 4-0 Vicryl subcuticular stitch. The patient tolerated the procedure well.  Instrument, sponge, and needle counts were correct times three.  The patient was then taken to the recovery room awake and in stable condition.   Estimated Blood Loss:  minimal      Drains: None. Patient voided prior to procedure.          Total IV Fluids:  300 ml  Specimens: Segments of bilateral fallopian tubes.          Implants: None         Complications:  None; patient tolerated the procedure well.         Disposition: PACU - hemodynamically stable.         Condition: stable   Hildred Laser, MD [facility]

## 2019-05-09 NOTE — Progress Notes (Signed)
Results for LAFONDA, PATRON (MRN [id number]) as of 05/09/2019 13:13  Ref. Range 05/09/2019 06:19 05/09/2019 11:42  Glucose-Capillary Latest Ref Range: 70 - 99 mg/dL 102 (H) 92   Postpartum Day #1  History: GDM  Home DM Meds: Glyburide 2.5 mg AM/ 5 mg PM  Current Orders:  Glyburide 2.5 mg AM/ 5 mg PM     MD- May consider discontinuing Glyburide for now.  CBGs have been OK without any diabetes meds on board since admission.  Would continue CBG checks  Patient should follow up with PCP or OB GYN after d/c to make sure GDM fully resolved     --Will follow patient during hospitalization--  Ambrose Finland RN, MSN, CDE Diabetes Coordinator Inpatient Glycemic Control Team Team Pager: [phone] (8a-5p)

## 2019-05-10 ENCOUNTER — Encounter: Payer: Medicaid Other | Admitting: Obstetrics and Gynecology

## 2019-05-10 ENCOUNTER — Other Ambulatory Visit: Payer: Medicaid Other

## 2019-05-10 LAB — SURGICAL PATHOLOGY

## 2019-05-10 MED ORDER — HYDROCODONE-ACETAMINOPHEN 5-325 MG PO TABS: 1.0000 | ORAL_TABLET | Freq: Four times a day (QID) | ORAL | 0 refills | Status: DC | PRN

## 2019-05-10 MED ORDER — FERROUS SULFATE 325 (65 FE) MG PO TABS: 325.0000 mg | ORAL_TABLET | Freq: Two times a day (BID) | ORAL | 3 refills | Status: DC

## 2019-05-10 MED ORDER — IBUPROFEN 600 MG PO TABS: 600.0000 mg | ORAL_TABLET | Freq: Four times a day (QID) | ORAL | 0 refills | Status: DC

## 2019-05-10 MED ORDER — NIFEDIPINE ER 30 MG PO TB24: 30.0000 mg | ORAL_TABLET | Freq: Every day | ORAL | 1 refills | Status: DC

## 2019-05-10 NOTE — Discharge Instructions (Signed)
Discharge Instructions:   Follow-up Appointment:  If there are any new medications, they have been ordered and will be available for pickup at the listed pharmacy on your way home from the hospital.   Call office if you have any of the following: headache, visual changes, fever >101.0 F, chills, shortness of breath, breast concerns, excessive vaginal bleeding, incision drainage or problems, leg pain or redness, depression or any other concerns. If you have vaginal discharge with an odor, let your doctor know.   It is normal to bleed for up to 6 weeks. You should not soak through more than 1 pad in 1 hour. If you have a blood clot larger than your fist with continued bleeding, call your doctor.   Activity: Do not lift > 10 lbs for 6 weeks (do not lift anything heavier than your baby). No intercourse, tampons, swimming pools, hot tubs, baths (only showers) for 6 weeks.  No driving for 1-2 weeks. Continue prenatal vitamin, especially if breastfeeding. Increase calories and fluids (water) while breastfeeding.   Your milk will come in, in the next couple of days (right now it is colostrum). You may have a slight fever when your milk comes in, but it should go away on its own.  If it does not, and rises above 101 F please call the doctor. You will also feel achy and your breasts will be firm. They will also start to leak. If you are breastfeeding, continue as you have been and you can pump/express milk for comfort.   If you have too much milk, your breasts can become engorged, which could lead to mastitis. This is an infection of the milk ducts. It can be very painful and you will need to notify your doctor to obtain a prescription for antibiotics. You can also treat it with a shower or hot/cold compress.   For concerns about your baby, please call your pediatrician.  For breastfeeding concerns, the lactation consultant can be reached at [phone].   Postpartum blues (feelings of happy one minute  and sad another minute) are normal for the first few weeks but if it gets worse let your doctor know.   Congratulations! We enjoyed caring for you and your new bundle of joy!   Postpartum Tubal Ligation, Care After This sheet gives you information about how to care for yourself after your procedure. Your health care provider may also give you more specific instructions. If you have problems or questions, contact your health care provider. What can I expect after the procedure? After the procedure, you may have:  A sore throat.  Bruising or pain in your back.  Nausea or vomiting.  Dizziness.  Mild abdominal discomfort or pain, such as cramping, gas pain, or feeling bloated.  Soreness around the incision area.  Tiredness.  Pain in your shoulders. Follow these instructions at home: Medicines  Ask your health care provider if the medicine prescribed to you: ? Requires you to avoid driving or using heavy machinery. ? Can cause constipation. You may need to take actions to prevent or treat constipation, such as:  Drink enough fluid to keep your urine pale yellow.  Take over-the-counter or prescription medicines.  Eat foods that are high in fiber, such as beans, whole grains, and fresh fruits and vegetables.  Limit foods that are high in fat and processed sugars, such as fried or sweet foods.  Do not take aspirin because it can cause bleeding. Activity  Rest for the remainder of the day.  Return to your normal activities as told by your health care provider. Ask your health care provider what activities are safe for you.  Do not have sex, douche, or put a tampon or anything else in your vagina for 6 weeks or as long as told by your health care provider.  Do not lift anything that is heavier than your baby for 2 weeks, or the limit that you are told, until your health care provider says that it is safe. Incision care      Follow instructions from your health care  provider about how to take care of your incision. Make sure you: ? Wash your hands with soap and water before and after you change your bandage (dressing). If soap and water are not available, use hand sanitizer. ? Change your dressing as told by your health care provider. ? Leave stitches (sutures), skin glue, or adhesive strips in place. These skin closures may need to stay in place for 2 weeks or longer. If adhesive strip edges start to loosen and curl up, you may trim the loose edges. Do not remove adhesive strips completely unless your health care provider tells you to do that.  Check your incision area every day for signs of infection. Check for: ? Redness, swelling, or pain. ? Fluid or blood. ? Warmth. ? Pus or a bad smell. Other Instructions  Do not take baths, swim, or use a hot tub until your health care provider approves. Ask your health care provider if you may take showers. You may only be allowed to take sponge baths.  Keep all follow-up visits as told by your health care provider. This is important. Contact a health care provider if:  You have redness, swelling, or pain around your incision.  Your incision feels warm to the touch.  Your pain does not improve after 2-3 days.  You have a rash.  You repeatedly become dizzy or lightheaded.  Your pain medicine is not helping.  You are constipated. Get help right away if you:  Have a fever.  Faint.  Have pain in your abdomen that gets worse.  Have fluid or blood coming from your incision.  You have pus or a bad smell coming from your incision.  The edges of your incision break open after the sutures have been removed.  Have shortness of breath or trouble breathing.  Have chest pain or leg pain.  Have ongoing nausea or diarrhea. Summary  Mild abdominal discomfort is common after this procedure.  Contact your health care provider if you experience problems or have concerns.  Do not lift anything that is  heavier than your baby for 2 weeks, or the limit that you are told, until your health care provider says that it is safe.  Keep all follow-up visits as told by your health care provider. This is important. This information is not intended to replace advice given to you by your health care provider. Make sure you discuss any questions you have with your health care provider. Document Revised: 06/20/2018 Document Reviewed: 11/25/2017 Elsevier Patient Education  2020 Else[employer] Inc.   Ibuprofen tablets and capsules What is this medicine? IBUPROFEN (eye BYOO proe fen) is a non-steroidal anti-inflammatory drug (NSAID). It is used for dental pain, fever, headaches or migraines, osteoarthritis, rheumatoid arthritis, or painful monthly periods. It can also relieve minor aches and pains caused by a cold, flu, or sore throat. This medicine may be used for other purposes; ask your health care provider or pharmacist  if you have questions. COMMON BRAND NAME(S): Advil, Advil Junior Strength, Advil Migraine, Genpril, Ibren, IBU, Ibupak, Midol, Midol Cramps and Body Aches, Motrin, Motrin IB, Motrin Junior Strength, Motrin Migraine Pain, Samson-8, Toxicology Saliva Collection What should I tell my health care provider before I take this medicine? They need to know if you have any of these conditions:  cigarette smoker  coronary artery bypass graft (CABG) surgery within the past 2 weeks  drink more than 3 alcohol-containing drinks a day  heart disease  high blood pressure  history of stomach bleeding  kidney disease  liver disease  lung or breathing disease, like asthma  an unusual or allergic reaction to ibuprofen, aspirin, other NSAIDs, other medicines, foods, dyes, or preservatives  pregnant or trying to get pregnant  breast-feeding How should I use this medicine? Take this medicine by mouth with a glass of water. Follow the directions on the prescription label. Take this medicine with food  if your stomach gets upset. Try to not lie down for at least 10 minutes after you take the medicine. Take your medicine at regular intervals. Do not take your medicine more often than directed. A special MedGuide will be given to you by the pharmacist with each prescription and refill. Be sure to read this information carefully each time. Talk to your pediatrician regarding the use of this medicine in children. Special care may be needed. Overdosage: If you think you have taken too much of this medicine contact a poison control center or emergency room at once. NOTE: This medicine is only for you. Do not share this medicine with others. What if I miss a dose? If you miss a dose, take it as soon as you can. If it is almost time for your next dose, take only that dose. Do not take double or extra doses. What may interact with this medicine? Do not take this medicine with any of the following medications:  cidofovir  ketorolac  methotrexate  pemetrexed This medicine may also interact with the following medications:  alcohol  aspirin  diuretics  lithium  other drugs for inflammation like prednisone  warfarin This list may not describe all possible interactions. Give your health care provider a list of all the medicines, herbs, non-prescription drugs, or dietary supplements you use. Also tell them if you smoke, drink alcohol, or use illegal drugs. Some items may interact with your medicine. What should I watch for while using this medicine? Tell your doctor or healthcare provider if your symptoms do not start to get better or if they get worse. This medicine may cause serious skin reactions. They can happen weeks to months after starting the medicine. Contact your healthcare provider right away if you notice fevers or flu-like symptoms with a rash. The rash may be red or purple and then turn into blisters or peeling of the skin. Or, you might notice a red rash with swelling of the face,  lips or lymph nodes in your neck or under your arms. This medicine does not prevent heart attack or stroke. In fact, this medicine may increase the chance of a heart attack or stroke. The chance may increase with longer use of this medicine and in people who have heart disease. If you take aspirin to prevent heart attack or stroke, talk with your doctor or healthcare provider. Do not take other medicines that contain aspirin, ibuprofen, or naproxen with this medicine. Side effects such as stomach upset, nausea, or ulcers may be more likely to  occur. Many medicines available without a prescription should not be taken with this medicine. This medicine can cause ulcers and bleeding in the stomach and intestines at any time during treatment. Ulcers and bleeding can happen without warning symptoms and can cause death. To reduce your risk, do not smoke cigarettes or drink alcohol while you are taking this medicine. You may get drowsy or dizzy. Do not drive, use machinery, or do anything that needs mental alertness until you know how this medicine affects you. Do not stand or sit up quickly, especially if you are an older patient. This reduces the risk of dizzy or fainting spells. This medicine can cause you to bleed more easily. Try to avoid damage to your teeth and gums when you brush or floss your teeth. This medicine may be used to treat migraines. If you take migraine medicines for 10 or more days a month, your migraines may get worse. Keep a diary of headache days and medicine use. Contact your healthcare provider if your migraine attacks occur more frequently. What side effects may I notice from receiving this medicine? Side effects that you should report to your doctor or health care professional as soon as possible:  allergic reactions like skin rash, itching or hives, swelling of the face, lips, or tongue  redness, blistering, peeling or loosening of the skin, including inside the mouth  severe  stomach pain  signs and symptoms of bleeding such as bloody or black, tarry stools; red or dark-brown urine; spitting up blood or brown material that looks like coffee grounds; red spots on the skin; unusual bruising or bleeding from the eye, gums, or nose  signs and symptoms of a blood clot such as changes in vision; chest pain; severe, sudden headache; trouble speaking; sudden numbness or weakness of the face, arm, or leg  unexplained weight gain or swelling  unusually weak or tired  yellowing of eyes or skin Side effects that usually do not require medical attention (report to your doctor or health care professional if they continue or are bothersome):  bruising  diarrhea  dizziness, drowsiness  headache  nausea, vomiting This list may not describe all possible side effects. Call your doctor for medical advice about side effects. You may report side effects to FDA at 1-800-FDA-1088. Where should I keep my medicine? Keep out of the reach of children. Store at room temperature between 15 and 30 degrees C (59 and 86 degrees F). Keep container tightly closed. Throw away any unused medicine after the expiration date. NOTE: This sheet is a summary. It may not cover all possible information. If you have questions about this medicine, talk to your doctor, pharmacist, or health care provider.  2020 Elsevier/Gold Standard (2018-03-23 14:11:00)   Acetaminophen; Hydrocodone tablets or capsules What is this medicine? ACETAMINOPHEN; HYDROCODONE (a set a MEE noe fen; hye droe KOE done) is a pain reliever. It is used to treat moderate to severe pain. This medicine may be used for other purposes; ask your health care provider or pharmacist if you have questions. COMMON BRAND NAME(S): Anexsia, Bancap HC, Ceta-Plus, Co-Gesic, Comfortpak, Dolagesic, [employer], [address], [address], Hydrogesic, Gentry, Lorcet HD, Lorcet Plus, Lortab, Margesic H, Maxidone, [place], Polygesic, Meyer, Bowles,  [employer], Vicodin, Vicodin ES, Vicodin HP, [place] [employer]eman What should I tell my health care provider before I take this medicine? They need to know if you have any of these conditions:  brain tumor  Crohn's disease, inflammatory bowel disease, or ulcerative colitis  drug abuse or  addiction  head injury  heart or circulation problems  if you often drink alcohol  kidney disease or problems going to the bathroom  liver disease  lung disease, asthma, or breathing problems  an unusual or allergic reaction to acetaminophen, hydrocodone, other opioid analgesics, other medicines, foods, dyes, or preservatives  pregnant or trying to get pregnant  breast-feeding How should I use this medicine? Take this medicine by mouth with a glass of water. Follow the directions on the prescription label. You can take it with or without food. If it upsets your stomach, take it with food. Do not take your medicine more often than directed. A special MedGuide will be given to you by the pharmacist with each prescription and refill. Be sure to read this information carefully each time. Talk to your pediatrician regarding the use of this medicine in children. Special care may be needed. Overdosage: If you think you have taken too much of this medicine contact a poison control center or emergency room at once. NOTE: This medicine is only for you. Do not share this medicine with others. What if I miss a dose? If you miss a dose, take it as soon as you can. If it is almost time for your next dose, take only that dose. Do not take double or extra doses. What may interact with this medicine? This medicine may interact with the following medications:  alcohol  antiviral medicines for HIV or AIDS  atropine  antihistamines for allergy, cough and cold  certain antibiotics like erythromycin, clarithromycin  certain medicines for anxiety or sleep  certain medicines for bladder problems like oxybutynin,  tolterodine  certain medicines for depression like amitriptyline, fluoxetine, sertraline  certain medicines for fungal infections like ketoconazole and itraconazole  certain medicines for Parkinson's disease like benztropine, trihexyphenidyl  certain medicines for seizures like carbamazepine, phenobarbital, phenytoin, primidone  certain medicines for stomach problems like dicyclomine, hyoscyamine  certain medicines for travel sickness like scopolamine  general anesthetics like halothane, isoflurane, methoxyflurane, propofol  ipratropium  local anesthetics like lidocaine, pramoxine, tetracaine  MAOIs like Carbex, Eldepryl, Marplan, Nardil, and Parnate  medicines that relax muscles for surgery  other medicines with acetaminophen  other narcotic medicines for pain or cough  phenothiazines like chlorpromazine, mesoridazine, prochlorperazine, thioridazine  rifampin This list may not describe all possible interactions. Give your health care provider a list of all the medicines, herbs, non-prescription drugs, or dietary supplements you use. Also tell them if you smoke, drink alcohol, or use illegal drugs. Some items may interact with your medicine. What should I watch for while using this medicine? Tell your health care provider if your pain does not go away, if it gets worse, or if you have new or a different type of pain. You may develop tolerance to this drug. Tolerance means that you will need a higher dose of the drug for pain relief. Tolerance is normal and is expected if you take this drug for a long time. There are different types of narcotic drugs (opioids) for pain. If you take more than one type at the same time, you may have more side effects. Give your health care provider a list of all drugs you use. He or she will tell you how much drug to take. Do not take more drug than directed. Get emergency help right away if you have problems breathing. Do not suddenly stop taking  your drug because you may develop a severe reaction. Your body becomes used to the drug. This  does NOT mean you are addicted. Addiction is a behavior related to getting and using a drug for a nonmedical reason. If you have pain, you have a medical reason to take pain drug. Your health care provider will tell you how much drug to take. If your health care provider wants you to stop the drug, the dose will be slowly lowered over time to avoid any side effects. Talk to your health care provider about naloxone and how to get it. Naloxone is an emergency drug used for an opioid overdose. An overdose can happen if you take too much opioid. It can also happen if an opioid is taken with some other drugs or substances, like alcohol. Know the symptoms of an overdose, like trouble breathing, unusually tired or sleepy, or not being able to respond or wake up. Make sure to tell caregivers and close contacts where it is stored. Make sure they know how to use it. After naloxone is given, you must get emergency help right away. Naloxone is a temporary treatment. Repeat doses may be needed. Do not take other drugs that contain acetaminophen with this drug. Many non-prescription drugs contain acetaminophen. Always read labels carefully. If you have questions, ask your health care provider. If you take too much acetaminophen, get medical help right away. Too much acetaminophen can be very dangerous and cause liver damage. Even if you do not have symptoms, it is important to get help right away. You may get drowsy or dizzy. Do not drive, use machinery, or do anything that needs mental alertness until you know how this drug affects you. Do not stand up or sit up quickly, especially if you are an older patient. This reduces the risk of dizzy or fainting spells. Alcohol may interfere with the effect of this drug. Avoid alcoholic drinks. This drug will cause constipation. If you do not have a bowel movement for 3 days, call your  health care provider. Your mouth may get dry. Chewing sugarless gum or sucking hard candy and drinking plenty of water may help. Contact your health care provider if the problem does not go away or is severe. What side effects may I notice from receiving this medicine? Side effects that you should report to your doctor or health care professional as soon as possible:  allergic reactions like skin rash, itching or hives, swelling of the face, lips, or tongue  breathing problems  confusion  redness, blistering, peeling or loosening of the skin, including inside the mouth  signs and symptoms of low blood pressure like dizziness; feeling faint or lightheaded, falls; unusually weak or tired  trouble passing urine or change in the amount of urine  yellowing of the eyes or skin Side effects that usually do not require medical attention (report to your doctor or health care professional if they continue or are bothersome):  constipation  dry mouth  nausea, vomiting  tiredness This list may not describe all possible side effects. Call your doctor for medical advice about side effects. You may report side effects to FDA at 1-800-FDA-1088. Where should I keep my medicine? Keep out of the reach of children. This medicine can be abused. Keep your medicine in a safe place to protect it from theft. Do not share this medicine with anyone. Selling or giving away this medicine is dangerous and against the law. Store at room temperature between 15 and 30 degrees C (59 and 86 degrees F). This medicine may cause harm and death if it is taken  by other adults, children, or pets. Return medicine that has not been used to an official disposal site. Contact the DEA at 863-325-0543 or your city/county government to find a site. If you cannot return the medicine, flush it down the toilet. Do not use the medicine after the expiration date. NOTE: This sheet is a summary. It may not cover all possible  information. If you have questions about this medicine, talk to your doctor, pharmacist, or health care provider.  2020 Elsevier/Gold Standard (2018-08-15 12:05:41)   Postpartum Baby Blues The postpartum period begins right after the birth of a baby. During this time, there is often a lot of joy and excitement. It is also a time of many changes in the life of the parents. No matter how many times a mother gives birth, each child brings new challenges to the family, including different ways of relating to one another. It is common to have feelings of excitement along with confusing changes in moods, emotions, and thoughts. You may feel happy one minute and sad or stressed the next. These feelings of sadness usually happen in the period right after you have your baby, and they go away within a week or two. This is called the "baby blues." What are the causes? There is no known cause of baby blues. It is likely caused by a combination of factors. However, changes in hormone levels after childbirth are believed to trigger some of the symptoms. Other factors that can play a role in these mood changes include:  Lack of sleep.  Stressful life events, such as poverty, caring for a loved one, or death of a loved one.  Genetics. What are the signs or symptoms? Symptoms of this condition include:  Brief changes in mood, such as going from extreme happiness to sadness.  Decreased concentration.  Difficulty sleeping.  Crying spells and tearfulness.  Loss of appetite.  Irritability.  Anxiety. If the symptoms of baby blues last for more than 2 weeks or become more severe, you may have postpartum depression. How is this diagnosed? This condition is diagnosed based on an evaluation of your symptoms. There are no medical or lab tests that lead to a diagnosis, but there are various questionnaires that a health care provider may use to identify women with the baby blues or postpartum depression. How  is this treated? Treatment is not needed for this condition. The baby blues usually go away on their own in 1-2 weeks. Social support is often all that is needed. You will be encouraged to get adequate sleep and rest. Follow these instructions at home: Lifestyle      Get as much rest as you can. Take a nap when the baby sleeps.  Exercise regularly as told by your health care provider. Some women find yoga and walking to be helpful.  Eat a balanced and nourishing diet. This includes plenty of fruits and vegetables, whole grains, and lean proteins.  Do little things that you enjoy. Have a cup of tea, take a bubble bath, read your favorite magazine, or listen to your favorite music.  Avoid alcohol.  Ask for help with household chores, cooking, grocery shopping, or running errands. Do not try to do everything yourself. Consider hiring a postpartum doula to help. This is a professional who specializes in providing support to new mothers.  Try not to make any major life changes during pregnancy or right after giving birth. This can add stress. General instructions  Talk to people close to you  about how you are feeling. Get support from your partner, family members, friends, or other new moms. You may want to join a support group.  Find ways to cope with stress. This may include: ? Writing your thoughts and feelings in a journal. ? Spending time outside. ? Spending time with people who make you laugh.  Try to stay positive in how you think. Think about the things you are grateful for.  Take over-the-counter and prescription medicines only as told by your health care provider.  Let your health care provider know if you have any concerns.  Keep all postpartum visits as told by your health care provider. This is important. Contact a health care provider if:  Your baby blues do not go away after 2 weeks. Get help right away if:  You have thoughts of taking your own life (suicidal  thoughts).  You think you may harm the baby or other people.  You see or hear things that are not there (hallucinations). Summary  After giving birth, you may feel happy one minute and sad or stressed the next. Feelings of sadness that happen right after the baby is born and go away after a week or two are called the "baby blues."  You can manage the baby blues by getting enough rest, eating a healthy diet, exercising, spending time with supportive people, and finding ways to cope with stress.  If feelings of sadness and stress last longer than 2 weeks or get in the way of caring for your baby, talk to your health care provider. This may mean you have postpartum depression. This information is not intended to replace advice given to you by your health care provider. Make sure you discuss any questions you have with your health care provider. Document Revised: 04/29/2018 Document Reviewed: 03/03/2016 Elsevier Patient Education  2020 [employer].   Breastfeeding  Choosing to breastfeed is one of the best decisions you can make for yourself and your baby. A change in hormones during pregnancy causes your breasts to make breast milk in your milk-producing glands. Hormones prevent breast milk from being released before your baby is born. They also prompt milk flow after birth. Once breastfeeding has begun, thoughts of your baby, as well as his or her sucking or crying, can stimulate the release of milk from your milk-producing glands. Benefits of breastfeeding Research shows that breastfeeding offers many health benefits for infants and mothers. It also offers a cost-free and convenient way to feed your baby. For your baby  Your first milk (colostrum) helps your baby's digestive system to function better.  Special cells in your milk (antibodies) help your baby to fight off infections.  Breastfed babies are less likely to develop asthma, allergies, obesity, or type 2 diabetes. They are also  at lower risk for sudden infant death syndrome (SIDS).  Nutrients in breast milk are better able to meet your baby's needs compared to infant formula.  Breast milk improves your baby's brain development. For you  Breastfeeding helps to create a very special bond between you and your baby.  Breastfeeding is convenient. Breast milk costs nothing and is always available at the correct temperature.  Breastfeeding helps to burn calories. It helps you to lose the weight that you gained during pregnancy.  Breastfeeding makes your uterus return faster to its size before pregnancy. It also slows bleeding (lochia) after you give birth.  Breastfeeding helps to lower your risk of developing type 2 diabetes, osteoporosis, rheumatoid arthritis, cardiovascular disease, and  breast, ovarian, uterine, and endometrial cancer later in life. Breastfeeding basics Starting breastfeeding  Find a comfortable place to sit or lie down, with your neck and back well-supported.  Place a pillow or a rolled-up blanket under your baby to bring him or her to the level of your breast (if you are seated). Nursing pillows are specially designed to help support your arms and your baby while you breastfeed.  Make sure that your baby's tummy (abdomen) is facing your abdomen.  Gently massage your breast. With your fingertips, massage from the outer edges of your breast inward toward the nipple. This encourages milk flow. If your milk flows slowly, you may need to continue this action during the feeding.  Support your breast with 4 fingers underneath and your thumb above your nipple (make the letter "C" with your hand). Make sure your fingers are well away from your nipple and your baby's mouth.  Stroke your baby's lips gently with your finger or nipple.  When your baby's mouth is open wide enough, quickly bring your baby to your breast, placing your entire nipple and as much of the areola as possible into your baby's mouth.  The areola is the colored area around your nipple. ? More areola should be visible above your baby's upper lip than below the lower lip. ? Your baby's lips should be opened and extended outward (flanged) to ensure an adequate, comfortable latch. ? Your baby's tongue should be between his or her lower gum and your breast.  Make sure that your baby's mouth is correctly positioned around your nipple (latched). Your baby's lips should create a seal on your breast and be turned out (everted).  It is common for your baby to suck about 2-3 minutes in order to start the flow of breast milk. Latching Teaching your baby how to latch onto your breast properly is very important. An improper latch can cause nipple pain, decreased milk supply, and poor weight gain in your baby. Also, if your baby is not latched onto your nipple properly, he or she may swallow some air during feeding. This can make your baby fussy. Burping your baby when you switch breasts during the feeding can help to get rid of the air. However, teaching your baby to latch on properly is still the best way to prevent fussiness from swallowing air while breastfeeding. Signs that your baby has successfully latched onto your nipple  Silent tugging or silent sucking, without causing you pain. Infant's lips should be extended outward (flanged).  Swallowing heard between every 3-4 sucks once your milk has started to flow (after your let-down milk reflex occurs).  Muscle movement above and in front of his or her ears while sucking. Signs that your baby has not successfully latched onto your nipple  Sucking sounds or smacking sounds from your baby while breastfeeding.  Nipple pain. If you think your baby has not latched on correctly, slip your finger into the corner of your baby's mouth to break the suction and place it between your baby's gums. Attempt to start breastfeeding again. Signs of successful breastfeeding Signs from your baby  Your  baby will gradually decrease the number of sucks or will completely stop sucking.  Your baby will fall asleep.  Your baby's body will relax.  Your baby will retain a small amount of milk in his or her mouth.  Your baby will let go of your breast by himself or herself. Signs from you  Breasts that have increased in firmness,  weight, and size 1-3 hours after feeding.  Breasts that are softer immediately after breastfeeding.  Increased milk volume, as well as a change in milk consistency and color by the fifth day of breastfeeding.  Nipples that are not sore, cracked, or bleeding. Signs that your baby is getting enough milk  Wetting at least 1-2 diapers during the first 24 hours after birth.  Wetting at least 5-6 diapers every 24 hours for the first week after birth. The urine should be clear or pale yellow by the age of 5 days.  Wetting 6-8 diapers every 24 hours as your baby continues to grow and develop.  At least 3 stools in a 24-hour period by the age of 5 days. The stool should be soft and yellow.  At least 3 stools in a 24-hour period by the age of 7 days. The stool should be seedy and yellow.  No loss of weight greater than 10% of birth weight during the first 3 days of life.  Average weight gain of 4-7 oz (113-198 g) per week after the age of 4 days.  Consistent daily weight gain by the age of 5 days, without weight loss after the age of 2 weeks. After a feeding, your baby may spit up a small amount of milk. This is normal. Breastfeeding frequency and duration Frequent feeding will help you make more milk and can prevent sore nipples and extremely full breasts (breast engorgement). Breastfeed when you feel the need to reduce the fullness of your breasts or when your baby shows signs of hunger. This is called "breastfeeding on demand." Signs that your baby is hungry include:  Increased alertness, activity, or restlessness.  Movement of the head from side to  side.  Opening of the mouth when the corner of the mouth or cheek is stroked (rooting).  Increased sucking sounds, smacking lips, cooing, sighing, or squeaking.  Hand-to-mouth movements and sucking on fingers or hands.  Fussing or crying. Avoid introducing a pacifier to your baby in the first 4-6 weeks after your baby is born. After this time, you may choose to use a pacifier. Research has shown that pacifier use during the first year of a baby's life decreases the risk of sudden infant death syndrome (SIDS). Allow your baby to feed on each breast as long as he or she wants. When your baby unlatches or falls asleep while feeding from the first breast, offer the second breast. Because newborns are often sleepy in the first few weeks of life, you may need to awaken your baby to get him or her to feed. Breastfeeding times will vary from baby to baby. However, the following rules can serve as a guide to help you make sure that your baby is properly fed:  Newborns (babies 254 weeks of age or younger) may breastfeed every 1-3 hours.  Newborns should not go without breastfeeding for longer than 3 hours during the day or 5 hours during the night.  You should breastfeed your baby a minimum of 8 times in a 24-hour period. Breast milk pumping     Pumping and storing breast milk allows you to make sure that your baby is exclusively fed your breast milk, even at times when you are unable to breastfeed. This is especially important if you go back to work while you are still breastfeeding, or if you are not able to be present during feedings. Your lactation consultant can help you find a method of pumping that works best for you and  give you guidelines about how long it is safe to store breast milk. Caring for your breasts while you breastfeed Nipples can become dry, cracked, and sore while breastfeeding. The following recommendations can help keep your breasts moisturized and healthy:  Avoid using soap on  your nipples.  Wear a supportive bra designed especially for nursing. Avoid wearing underwire-style bras or extremely tight bras (sports bras).  Air-dry your nipples for 3-4 minutes after each feeding.  Use only cotton bra pads to absorb leaked breast milk. Leaking of breast milk between feedings is normal.  Use lanolin on your nipples after breastfeeding. Lanolin helps to maintain your skin's normal moisture barrier. Pure lanolin is not harmful (not toxic) to your baby. You may also hand express a few drops of breast milk and gently massage that milk into your nipples and allow the milk to air-dry. In the first few weeks after giving birth, some women experience breast engorgement. Engorgement can make your breasts feel heavy, warm, and tender to the touch. Engorgement peaks within 3-5 days after you give birth. The following recommendations can help to ease engorgement:  Completely empty your breasts while breastfeeding or pumping. You may want to start by applying warm, moist heat (in the shower or with warm, water-soaked hand towels) just before feeding or pumping. This increases circulation and helps the milk flow. If your baby does not completely empty your breasts while breastfeeding, pump any extra milk after he or she is finished.  Apply ice packs to your breasts immediately after breastfeeding or pumping, unless this is too uncomfortable for you. To do this: ? Put ice in a plastic bag. ? Place a towel between your skin and the bag. ? Leave the ice on for 20 minutes, 2-3 times a day.  Make sure that your baby is latched on and positioned properly while breastfeeding. If engorgement persists after 48 hours of following these recommendations, contact your health care provider or a Advertising copywriter. Overall health care recommendations while breastfeeding  Eat 3 healthy meals and 3 snacks every day. Well-nourished mothers who are breastfeeding need an additional 450-500 calories a day.  You can meet this requirement by increasing the amount of a balanced diet that you eat.  Drink enough water to keep your urine pale yellow or clear.  Rest often, relax, and continue to take your prenatal vitamins to prevent fatigue, stress, and low vitamin and mineral levels in your body (nutrient deficiencies).  Do not use any products that contain nicotine or tobacco, such as cigarettes and e-cigarettes. Your baby may be harmed by chemicals from cigarettes that pass into breast milk and exposure to secondhand smoke. If you need help quitting, ask your health care provider.  Avoid alcohol.  Do not use illegal drugs or marijuana.  Talk with your health care provider before taking any medicines. These include over-the-counter and prescription medicines as well as vitamins and herbal supplements. Some medicines that may be harmful to your baby can pass through breast milk.  It is possible to become pregnant while breastfeeding. If birth control is desired, ask your health care provider about options that will be safe while breastfeeding your baby. Where to find more information: [employer]: www.llli.org Contact a health care provider if:  You feel like you want to stop breastfeeding or have become frustrated with breastfeeding.  Your nipples are cracked or bleeding.  Your breasts are red, tender, or warm.  You have: ? Painful breasts or nipples. ? A swollen  area on either breast. ? A fever or chills. ? Nausea or vomiting. ? Drainage other than breast milk from your nipples.  Your breasts do not become full before feedings by the fifth day after you give birth.  You feel sad and depressed.  Your baby is: ? Too sleepy to eat well. ? Having trouble sleeping. ? More than 49 week old and wetting fewer than 6 diapers in a 24-hour period. ? Not gaining weight by 30 days of age.  Your baby has fewer than 3 stools in a 24-hour period.  Your baby's skin or the white  parts of his or her eyes become yellow. Get help right away if:  Your baby is overly tired (lethargic) and does not want to wake up and feed.  Your baby develops an unexplained fever. Summary  Breastfeeding offers many health benefits for infant and mothers.  Try to breastfeed your infant when he or she shows early signs of hunger.  Gently tickle or stroke your baby's lips with your finger or nipple to allow the baby to open his or her mouth. Bring the baby to your breast. Make sure that much of the areola is in your baby's mouth. Offer one side and burp the baby before you offer the other side.  Talk with your health care provider or lactation consultant if you have questions or you face problems as you breastfeed. This information is not intended to replace advice given to you by your health care provider. Make sure you discuss any questions you have with your health care provider. Document Revised: 04/01/2017 Document Reviewed: 02/07/2016 Elsevier Patient Education  2020 [employer]   Postpartum Care After Vaginal Delivery This sheet gives you information about how to care for yourself from the time you deliver your baby to up to 6-12 weeks after delivery (postpartum period). Your health care provider may also give you more specific instructions. If you have problems or questions, contact your health care provider. Follow these instructions at home: Vaginal bleeding  It is normal to have vaginal bleeding (lochia) after delivery. Wear a sanitary pad for vaginal bleeding and discharge. ? During the first week after delivery, the amount and appearance of lochia is often similar to a menstrual period. ? Over the next few weeks, it will gradually decrease to a dry, yellow-brown discharge. ? For most women, lochia stops completely by 4-6 weeks after delivery. Vaginal bleeding can vary from woman to woman.  Change your sanitary pads frequently. Watch for any changes in your flow, such  as: ? A sudden increase in volume. ? A change in color. ? Large blood clots.  If you pass a blood clot from your vagina, save it and call your health care provider to discuss. Do not flush blood clots down the toilet before talking with your health care provider.  Do not use tampons or douches until your health care provider says this is safe.  If you are not breastfeeding, your period should return 6-8 weeks after delivery. If you are feeding your child breast milk only (exclusive breastfeeding), your period may not return until you stop breastfeeding. Perineal care  Keep the area between the vagina and the anus (perineum) clean and dry as told by your health care provider. Use medicated pads and pain-relieving sprays and creams as directed.  If you had a cut in the perineum (episiotomy) or a tear in the vagina, check the area for signs of infection until you are healed. Check for: ?  More redness, swelling, or pain. ? Fluid or blood coming from the cut or tear. ? Warmth. ? Pus or a bad smell.  You may be given a squirt bottle to use instead of wiping to clean the perineum area after you go to the bathroom. As you start healing, you may use the squirt bottle before wiping yourself. Make sure to wipe gently.  To relieve pain caused by an episiotomy, a tear in the vagina, or swollen veins in the anus (hemorrhoids), try taking a warm sitz bath 2-3 times a day. A sitz bath is a warm water bath that is taken while you are sitting down. The water should only come up to your hips and should cover your buttocks. Breast care  Within the first few days after delivery, your breasts may feel heavy, full, and uncomfortable (breast engorgement). Milk may also leak from your breasts. Your health care provider can suggest ways to help relieve the discomfort. Breast engorgement should go away within a few days.  If you are breastfeeding: ? Wear a bra that supports your breasts and fits you well. ? Keep  your nipples clean and dry. Apply creams and ointments as told by your health care provider. ? You may need to use breast pads to absorb milk that leaks from your breasts. ? You may have uterine contractions every time you breastfeed for up to several weeks after delivery. Uterine contractions help your uterus return to its normal size. ? If you have any problems with breastfeeding, work with your health care provider or Advertising copywriter.  If you are not breastfeeding: ? Avoid touching your breasts a lot. Doing this can make your breasts produce more milk. ? Wear a good-fitting bra and use cold packs to help with swelling. ? Do not squeeze out (express) milk. This causes you to make more milk. Intimacy and sexuality  Ask your health care provider when you can engage in sexual activity. This may depend on: ? Your risk of infection. ? How fast you are healing. ? Your comfort and desire to engage in sexual activity.  You are able to get pregnant after delivery, even if you have not had your period. If desired, talk with your health care provider about methods of birth control (contraception). Medicines  Take over-the-counter and prescription medicines only as told by your health care provider.  If you were prescribed an antibiotic medicine, take it as told by your health care provider. Do not stop taking the antibiotic even if you start to feel better. Activity  Gradually return to your normal activities as told by your health care provider. Ask your health care provider what activities are safe for you.  Rest as much as possible. Try to rest or take a nap while your baby is sleeping. Eating and drinking   Drink enough fluid to keep your urine pale yellow.  Eat high-fiber foods every day. These may help prevent or relieve constipation. High-fiber foods include: ? Whole grain cereals and breads. ? Brown rice. ? Beans. ? Fresh fruits and vegetables.  Do not try to lose weight  quickly by cutting back on calories.  Take your prenatal vitamins until your postpartum checkup or until your health care provider tells you it is okay to stop. Lifestyle  Do not use any products that contain nicotine or tobacco, such as cigarettes and e-cigarettes. If you need help quitting, ask your health care provider.  Do not drink alcohol, especially if you are breastfeeding. General instructions  Keep all follow-up visits for you and your baby as told by your health care provider. Most women visit their health care provider for a postpartum checkup within the first 3-6 weeks after delivery. Contact a health care provider if:  You feel unable to cope with the changes that your child brings to your life, and these feelings do not go away.  You feel unusually sad or worried.  Your breasts become red, painful, or hard.  You have a fever.  You have trouble holding urine or keeping urine from leaking.  You have little or no interest in activities you used to enjoy.  You have not breastfed at all and you have not had a menstrual period for 12 weeks after delivery.  You have stopped breastfeeding and you have not had a menstrual period for 12 weeks after you stopped breastfeeding.  You have questions about caring for yourself or your baby.  You pass a blood clot from your vagina. Get help right away if:  You have chest pain.  You have difficulty breathing.  You have sudden, severe leg pain.  You have severe pain or cramping in your lower abdomen.  You bleed from your vagina so much that you fill more than one sanitary pad in one hour. Bleeding should not be heavier than your heaviest period.  You develop a severe headache.  You faint.  You have blurred vision or spots in your vision.  You have bad-smelling vaginal discharge.  You have thoughts about hurting yourself or your baby. If you ever feel like you may hurt yourself or others, or have thoughts about taking  your own life, get help right away. You can go to the nearest emergency department or call:  Your local emergency services (911 in the [place]  A suicide crisis helpline, such as the National Suicide Prevention Lifeline at (678) 253-9662. This is open 24 hours a day. Summary  The period of time right after you deliver your newborn up to 6-12 weeks after delivery is called the postpartum period.  Gradually return to your normal activities as told by your health care provider.  Keep all follow-up visits for you and your baby as told by your health care provider. This information is not intended to replace advice given to you by your health care provider. Make sure you discuss any questions you have with your health care provider. Document Revised: 01/08/2017 Document Reviewed: 10/19/2016 Elsevier Patient Education  2020 [employer].

## 2019-05-10 NOTE — Progress Notes (Signed)
Patient discharged home with infant. Discharge instructions and prescriptions given and reviewed with patient. Patient verbalized understanding. Escorted out by auxillary.  

## 2019-05-10 NOTE — Discharge Summary (Signed)
Obstetric Discharge Summary  Patient ID: Diamond Sutton MRN: [id number] DOB/AGE: 06-08-1985 33 y.o.   Date of Admission: 05/08/2019  Date of Discharge:  05/10/19  Admitting Diagnosis: Premature rupture of membrane at [redacted]w[redacted]d  Secondary Diagnosis: Grand multiparity, Gestational diabetes, Hypertension, Desires permanent sterilization  Mode of Delivery: normal spontaneous vaginal delivery    Discharge Diagnosis: No other diagnosis   Intrapartum Procedures: epidural   Post partum procedures: postpartum tubal ligation  Complications: none   Brief Hospital Course   Diamond Sutton is a [id number] who had a SVD on 05/08/2019;  for further details of this birth, please refer to the delivey summary.  Patient had an uncomplicated postpartum course. A bilateral tubal ligation was preformed by Dr. Valentino Saxon; for further details of this surgery, please refer to operative note. By time of discharge on PPD#2, her pain was controlled on oral pain medications; she had appropriate lochia and was ambulating, voiding without difficulty and tolerating regular diet.  She was deemed stable for discharge to home.    Labs: CBC Latest Ref Rng & Units 05/09/2019 05/08/2019 04/13/2019  WBC 4.0 - 10.5 K/uL 16.5(H) 13.1(H) 11.6(H)  Hemoglobin 12.0 - 15.0 g/dL 11.2(L) 12.1 11.6  Hematocrit 36.0 - 46.0 % 33.3(L) 35.9(L) 34.8  Platelets 150 - 400 K/uL 236 258 249   O POS  Physical exam:   Temp:  [96.8 F (36 C)-98.9 F (37.2 C)] 98.5 F (36.9 C) (04/21 0344) Pulse Rate:  [66-91] 75 (04/21 0344) Resp:  [15-20] 18 (04/21 0008) BP: (117-149)/(72-98) 124/92 (04/21 0344) SpO2:  [99 %-100 %] 100 % (04/21 0344)  General: alert and no distress  Lochia: appropriate  Abdomen: soft, NT  Uterine Fundus: firm  Incision: dermabond present, no significant drainage, no dehiscence, no significant erythema  Extremities: No evidence of DVT seen on physical exam. No lower extremity edema.  Discharge Instructions:  Per After Visit Summary.  Activity: Advance as tolerated. Pelvic rest for 6 weeks.  Also refer to After Visit Summary  Diet: Regular  Medications: Allergies as of 05/10/2019      Reactions   Penicillins Anaphylaxis   12/2014: received ancef w/o complications Has patient had a PCN reaction causing immediate rash, facial/tongue/throat swelling, SOB or lightheadedness with hypotension: Yes Has patient had a PCN reaction causing severe rash involving mucus membranes or skin necrosis: Yes Has patient had a PCN reaction that required hospitalization Yes Has patient had a PCN reaction occurring within the last 10 years: No If all of the above answers are "NO", then may proceed with Cephalosporin use.      Medication List    STOP taking these medications   aspirin 81 MG EC tablet   glyBURIDE 2.5 MG tablet Commonly known as: DIABETA   glyBURIDE 5 MG tablet Commonly known as: DIABETA     TAKE these medications   ferrous sulfate 325 (65 FE) MG tablet Take 1 tablet (325 mg total) by mouth 2 (two) times daily with a meal.   glucose blood test strip Test blood sugar 4 times per day and PRN.   HYDROcodone-acetaminophen 5-325 MG tablet Commonly known as: NORCO/VICODIN Take 1-2 tablets by mouth every 6 (six) hours as needed for moderate pain or severe pain.   ibuprofen 600 MG tablet Commonly known as: ADVIL Take 1 tablet (600 mg total) by mouth every 6 (six) hours.   NIFEdipine 30 MG 24 hr tablet Commonly known as: ADALAT CC Take 1 tablet (30 mg total) by mouth daily.   Prenatal  Vitamin 27-0.8 MG Tabs Take 1 tablet by mouth daily at 6 (six) AM.            Discharge Care Instructions  (From admission, onward)         Start     Ordered   05/10/19 0000  Discharge wound care:    Comments: Keep incision clean and dry   05/10/19 0351         Outpatient follow up:  Follow-up Information    [facility] Follow up.   Why: Please keep schedule appointments. One  (1) week blood pressure check and six (6) week postpartum visit.  Contact information: [address]  [place]         Postpartum contraception: bilateral tubal ligation  Discharged Condition: stable  Discharged to: home   Newborn Data:  Disposition:home with mother  Apgars: APGAR (1 MIN): 8   APGAR (5 MINS): 9    Baby Feeding: Bottle and Breast   Diona Fanti, CNM [facility], [facility] 05/10/19 3:52 AM

## 2019-05-10 NOTE — Anesthesia Postprocedure Evaluation (Addendum)
Anesthesia Post Note  Patient: Diamond Sutton  Procedure(s) Performed: POST PARTUM TUBAL LIGATION (N/A )  Patient location during evaluation: Mother Baby Anesthesia Type: Spinal Level of consciousness: awake and alert Pain management: pain level controlled Vital Signs Assessment: post-procedure vital signs reviewed and stable Respiratory status: spontaneous breathing, nonlabored ventilation and respiratory function stable Cardiovascular status: stable Postop Assessment: no headache, no backache and epidural receding Anesthetic complications: no     Last Vitals:  Vitals:   05/10/19 0344 05/10/19 0808  BP: (!) 124/92 120/85  Pulse: 75 81  Resp: 20 20  Temp: 36.9 C 36.9 C  SpO2: 100% 98%    Last Pain:  Vitals:   05/10/19 0808  TempSrc: Oral  PainSc:                  [name]

## 2019-05-10 NOTE — Plan of Care (Signed)
Vs stable; up ad lib; taking motrin and norco for pain control; breast and formula feeding; CNM already rounded on pt and put in discharge order and instructions; pt needs to watch CPR video

## 2019-05-10 NOTE — Lactation Note (Signed)
This note was copied from a baby's chart. Lactation Consultation Note  Patient Name: Diamond Sutton Date: 05/10/2019 Reason for consult: Follow-up assessment  Upon entering the room, MOB was in the bed on the phone. MOB stated that things went better overnight and that she figured out why the latch was uncomfortable. She corrected the baby in football hold and felt better while baby was at breast. She also said the coconut oil was helpful with her nipples.   [facility] student went over discharge information: what to expect in the next few days with pees and poops, cluster feeding, tummy size, and resources for when MOB gets home. MOB was comfortable with this information since she has experience with breastfeeding her previous children. LC student told MOB that she can call out before she leaves if need be.   Maternal Data Has patient been taught Hand Expression?: Yes Does the patient have breastfeeding experience prior to this delivery?: Yes  Feeding Feeding Type: Formula Nipple Type: Slow - flow  Lactation Tools Discussed/Used Tools: Bottle;Coconut oil   Consult Status Consult Status: Complete Date: 05/10/19    Thersa Salt [facility] 05/10/2019, 9:37 AM

## 2019-05-15 ENCOUNTER — Other Ambulatory Visit: Payer: Self-pay

## 2019-05-15 ENCOUNTER — Ambulatory Visit (INDEPENDENT_AMBULATORY_CARE_PROVIDER_SITE_OTHER): Payer: Medicaid Other | Admitting: Certified Nurse Midwife

## 2019-05-15 ENCOUNTER — Encounter: Payer: Self-pay | Admitting: Certified Nurse Midwife

## 2019-05-15 DIAGNOSIS — I10 Essential (primary) hypertension: Secondary | ICD-10-CM | POA: Diagnosis not present

## 2019-05-15 NOTE — Progress Notes (Signed)
Patient comes in today for BP check. She has been taking BP medication as prescribed. She is still having mild headaches. Denies blurred vision or dizziness.

## 2019-05-15 NOTE — Progress Notes (Signed)
GYN ENCOUNTER NOTE  Subjective:       Diamond Sutton is a 34 y.o. [id number] female here for postpartum blood pressure check, one (1) week status post vaginal birth and bilateral tubal ligation.   Reports headaches after starting medication that have since resolved.   Denies blurred vision, chest pain, difficulty breathing or respiratory distress, abdominal pain, excessive vaginal bleeding, dysuria, and leg pain.    Gynecologic History  No LMP recorded. (Menstrual status: Lactating).  Contraception: bilateral tubal ligation  Last Pap: 2017. Results were: normal per patient  Obstetric History  OB History  Gravida Para Term Preterm AB Living  10 8 6 2 2 8   SAB TAB Ectopic Multiple Live Births  1 0 1 0 7    # Outcome Date GA Lbr Len/2nd Weight Sex Delivery Anes PTL Lv  10 Preterm 05/08/19 [redacted]w[redacted]d 02:44 / 00:13 5 lb 9.2 oz (2.53 kg) F Vag-Spont EPI  LIV  9 Ectopic 2018          8 Preterm 12/30/14 [redacted]w[redacted]d  6 lb 0.7 oz (2.74 kg) M Vag-Spont EPI  LIV  7 Term 09/11/12 [redacted]w[redacted]d  7 lb 5 oz (3.317 kg) M Vag-Spont   LIV  6 Term 08/05/09 [redacted]w[redacted]d  6 lb 14 oz (3.118 kg) F Vag-Spont   LIV  5 Term 03/19/07 [redacted]w[redacted]d  7 lb 7 oz (3.374 kg) M Vag-Spont   LIV  4 Term 05/05/05 [redacted]w[redacted]d  6 lb 11 oz (3.033 kg) M Vag-Spont   LIV  3 SAB 2006 [redacted]w[redacted]d    SAB     2 Term 05/05/02 [redacted]w[redacted]d  6 lb 13 oz (3.09 kg) M Vag-Spont   LIV  1 Term             Past Medical History:  Diagnosis Date  . Anemia   . Depression 2001  . Ectopic pregnancy   . History of urinary tract infection   . Hypertension   . Ovarian cyst     Past Surgical History:  Procedure Laterality Date  . DILATION AND CURETTAGE OF UTERUS     SAB  . TUBAL LIGATION N/A 05/09/2019   Procedure: POST PARTUM TUBAL LIGATION;  Surgeon: 05/11/2019, MD;  Location: [facility] ORS;  Service: Gynecology;  Laterality: N/A;    Current Outpatient Medications on File Prior to Visit  Medication Sig Dispense Refill  . glucose blood test strip Test blood sugar 4 times per  day and PRN. 100 each 12  . NIFEdipine (ADALAT CC) 30 MG 24 hr tablet Take 1 tablet (30 mg total) by mouth daily. 30 tablet 1  . Prenatal Vit-Fe Fumarate-FA (PRENATAL VITAMIN) 27-0.8 MG TABS Take 1 tablet by mouth daily at 6 (six) AM. 100 tablet 0  . ferrous sulfate 325 (65 FE) MG tablet Take 1 tablet (325 mg total) by mouth 2 (two) times daily with a meal. (Patient not taking: Reported on 05/15/2019) 60 tablet 3  . HYDROcodone-acetaminophen (NORCO/VICODIN) 5-325 MG tablet Take 1-2 tablets by mouth every 6 (six) hours as needed for moderate pain or severe pain. (Patient not taking: Reported on 05/15/2019) 30 tablet 0  . ibuprofen (ADVIL) 600 MG tablet Take 1 tablet (600 mg total) by mouth every 6 (six) hours. (Patient not taking: Reported on 05/15/2019) 30 tablet 0   No current facility-administered medications on file prior to visit.    Allergies  Allergen Reactions  . Penicillins Anaphylaxis    12/2014: received ancef w/o complications  Has patient had a PCN reaction  causing immediate rash, facial/tongue/throat swelling, SOB or lightheadedness with hypotension: Yes Has patient had a PCN reaction causing severe rash involving mucus membranes or skin necrosis: Yes Has patient had a PCN reaction that required hospitalization Yes Has patient had a PCN reaction occurring within the last 10 years: No If all of the above answers are "NO", then may proceed with Cephalosporin use.     Social History   Socioeconomic History  . Marital status: Single    Spouse name: Not on file  . Number of children: Not on file  . Years of education: Not on file  . Highest education level: Not on file  Occupational History  . Not on file  Tobacco Use  . Smoking status: Former Smoker    Packs/day: 0.25    Years: 15.00    Pack years: 3.75    Types: Cigarettes  . Smokeless tobacco: Never Used  Substance and Sexual Activity  . Alcohol use: Not Currently    Comment: Last ETOH use 10/02/2018 - counseled to  not  use  . Drug use: Not Currently  . Sexual activity: Yes    Birth control/protection: Surgical    Comment: Last used Nexplanon 02/2018  Other Topics Concern  . Not on file  Social History Narrative  . Not on file   Social Determinants of Health   Financial Resource Strain:   . Difficulty of Paying Living Expenses:   Food Insecurity:   . Worried About Programme researcher, broadcasting/film/video in the Last Year:   . Barista in the Last Year:   Transportation Needs:   . Freight forwarder (Medical):   Marland Kitchen Lack of Transportation (Non-Medical):   Physical Activity:   . Days of Exercise per Week:   . Minutes of Exercise per Session:   Stress:   . Feeling of Stress :   Social Connections:   . Frequency of Communication with Friends and Family:   . Frequency of Social Gatherings with Friends and Family:   . Attends Religious Services:   . Active Member of Clubs or Organizations:   . Attends Banker Meetings:   Marland Kitchen Marital Status:   Intimate Partner Violence: Not At Risk  . Fear of Current or Ex-Partner: No  . Emotionally Abused: No  . Physically Abused: No  . Sexually Abused: No    Family History  Problem Relation Age of Onset  . Diabetes Mother   . Hypertension Mother   . Heart murmur Half-Brother   . Breast cancer Neg Hx   . Ovarian cancer Neg Hx   . Colon cancer Neg Hx   . Heart disease Neg Hx     The following portions of the patient's history were reviewed and updated as appropriate: allergies, current medications, past family history, past medical history, past social history, past surgical history and problem list.  Review of Systems  ROS negative except as noted above. Information obtained from patient.   Objective:   BP 131/89   Pulse 67   Ht 5\' 6"  (1.676 m)   Wt 171 lb 4.8 oz (77.7 kg)   BMI 27.65 kg/m    CONSTITUTIONAL: Well-developed, well-nourished female in no acute distress.   PHYSICAL EXAM: Not indicated.   Assessment:   1. Postpartum  care and examination   2. Lactating mother   3. Hypertension, unspecified type  Plan:   Patient agrees to continue medication as prescribed. Wishes to repeat blood pressure check next week prior to increasing  medication dose.   Reviewed red flag symptoms and when to call.   RTC x 1 week for blood pressure check or sooner if needed.    Diona Fanti, CNM [facility], [facility] 05/15/19 5:23 PM

## 2019-05-15 NOTE — Patient Instructions (Signed)
Postpartum Care After Vaginal Delivery This sheet gives you information about how to care for yourself from the time you deliver your baby to up to 6-12 weeks after delivery (postpartum period). Your health care provider may also give you more specific instructions. If you have problems or questions, contact your health care provider. Follow these instructions at home: Vaginal bleeding  It is normal to have vaginal bleeding (lochia) after delivery. Wear a sanitary pad for vaginal bleeding and discharge. ? During the first week after delivery, the amount and appearance of lochia is often similar to a menstrual period. ? Over the next few weeks, it will gradually decrease to a dry, yellow-brown discharge. ? For most women, lochia stops completely by 4-6 weeks after delivery. Vaginal bleeding can vary from woman to woman.  Change your sanitary pads frequently. Watch for any changes in your flow, such as: ? A sudden increase in volume. ? A change in color. ? Large blood clots.  If you pass a blood clot from your vagina, save it and call your health care provider to discuss. Do not flush blood clots down the toilet before talking with your health care provider.  Do not use tampons or douches until your health care provider says this is safe.  If you are not breastfeeding, your period should return 6-8 weeks after delivery. If you are feeding your child breast milk only (exclusive breastfeeding), your period may not return until you stop breastfeeding. Perineal care  Keep the area between the vagina and the anus (perineum) clean and dry as told by your health care provider. Use medicated pads and pain-relieving sprays and creams as directed.  If you had a cut in the perineum (episiotomy) or a tear in the vagina, check the area for signs of infection until you are healed. Check for: ? More redness, swelling, or pain. ? Fluid or blood coming from the cut or tear. ? Warmth. ? Pus or a bad  smell.  You may be given a squirt bottle to use instead of wiping to clean the perineum area after you go to the bathroom. As you start healing, you may use the squirt bottle before wiping yourself. Make sure to wipe gently.  To relieve pain caused by an episiotomy, a tear in the vagina, or swollen veins in the anus (hemorrhoids), try taking a warm sitz bath 2-3 times a day. A sitz bath is a warm water bath that is taken while you are sitting down. The water should only come up to your hips and should cover your buttocks. Breast care  Within the first few days after delivery, your breasts may feel heavy, full, and uncomfortable (breast engorgement). Milk may also leak from your breasts. Your health care provider can suggest ways to help relieve the discomfort. Breast engorgement should go away within a few days.  If you are breastfeeding: ? Wear a bra that supports your breasts and fits you well. ? Keep your nipples clean and dry. Apply creams and ointments as told by your health care provider. ? You may need to use breast pads to absorb milk that leaks from your breasts. ? You may have uterine contractions every time you breastfeed for up to several weeks after delivery. Uterine contractions help your uterus return to its normal size. ? If you have any problems with breastfeeding, work with your health care provider or lactation consultant.  If you are not breastfeeding: ? Avoid touching your breasts a lot. Doing this can make   your breasts produce more milk. ? Wear a good-fitting bra and use cold packs to help with swelling. ? Do not squeeze out (express) milk. This causes you to make more milk. Intimacy and sexuality  Ask your health care provider when you can engage in sexual activity. This may depend on: ? Your risk of infection. ? How fast you are healing. ? Your comfort and desire to engage in sexual activity.  You are able to get pregnant after delivery, even if you have not had  your period. If desired, talk with your health care provider about methods of birth control (contraception). Medicines  Take over-the-counter and prescription medicines only as told by your health care provider.  If you were prescribed an antibiotic medicine, take it as told by your health care provider. Do not stop taking the antibiotic even if you start to feel better. Activity  Gradually return to your normal activities as told by your health care provider. Ask your health care provider what activities are safe for you.  Rest as much as possible. Try to rest or take a nap while your baby is sleeping. Eating and drinking   Drink enough fluid to keep your urine pale yellow.  Eat high-fiber foods every day. These may help prevent or relieve constipation. High-fiber foods include: ? Whole grain cereals and breads. ? Brown rice. ? Beans. ? Fresh fruits and vegetables.  Do not try to lose weight quickly by cutting back on calories.  Take your prenatal vitamins until your postpartum checkup or until your health care provider tells you it is okay to stop. Lifestyle  Do not use any products that contain nicotine or tobacco, such as cigarettes and e-cigarettes. If you need help quitting, ask your health care provider.  Do not drink alcohol, especially if you are breastfeeding. General instructions  Keep all follow-up visits for you and your baby as told by your health care provider. Most women visit their health care provider for a postpartum checkup within the first 3-6 weeks after delivery. Contact a health care provider if:  You feel unable to cope with the changes that your child brings to your life, and these feelings do not go away.  You feel unusually sad or worried.  Your breasts become red, painful, or hard.  You have a fever.  You have trouble holding urine or keeping urine from leaking.  You have little or no interest in activities you used to enjoy.  You have not  breastfed at all and you have not had a menstrual period for 12 weeks after delivery.  You have stopped breastfeeding and you have not had a menstrual period for 12 weeks after you stopped breastfeeding.  You have questions about caring for yourself or your baby.  You pass a blood clot from your vagina. Get help right away if:  You have chest pain.  You have difficulty breathing.  You have sudden, severe leg pain.  You have severe pain or cramping in your lower abdomen.  You bleed from your vagina so much that you fill more than one sanitary pad in one hour. Bleeding should not be heavier than your heaviest period.  You develop a severe headache.  You faint.  You have blurred vision or spots in your vision.  You have bad-smelling vaginal discharge.  You have thoughts about hurting yourself or your baby. If you ever feel like you may hurt yourself or others, or have thoughts about taking your own life, get help  right away. You can go to the nearest emergency department or call:  Your local emergency services (911 in the [place]  A suicide crisis helpline, such as the National Suicide Prevention Lifeline at 1-800-273-8255. This is open 24 hours a day. Summary  The period of time right after you deliver your newborn up to 6-12 weeks after delivery is called the postpartum period.  Gradually return to your normal activities as told by your health care provider.  Keep all follow-up visits for you and your baby as told by your health care provider. This information is not intended to replace advice given to you by your health care provider. Make sure you discuss any questions you have with your health care provider. Document Revised: 01/08/2017 Document Reviewed: 10/19/2016 Elsevier Patient Education  2020 Elsevier Inc.  Postpartum Baby Blues The postpartum period begins right after the birth of a baby. During this time, there is often a lot of joy and excitement. It is also a  time of many changes in the life of the parents. No matter how many times a mother gives birth, each child brings new challenges to the family, including different ways of relating to one another. It is common to have feelings of excitement along with confusing changes in moods, emotions, and thoughts. You may feel happy one minute and sad or stressed the next. These feelings of sadness usually happen in the period right after you have your baby, and they go away within a week or two. This is called the "baby blues." What are the causes? There is no known cause of baby blues. It is likely caused by a combination of factors. However, changes in hormone levels after childbirth are believed to trigger some of the symptoms. Other factors that can play a role in these mood changes include:  Lack of sleep.  Stressful life events, such as poverty, caring for a loved one, or death of a loved one.  Genetics. What are the signs or symptoms? Symptoms of this condition include:  Brief changes in mood, such as going from extreme happiness to sadness.  Decreased concentration.  Difficulty sleeping.  Crying spells and tearfulness.  Loss of appetite.  Irritability.  Anxiety. If the symptoms of baby blues last for more than 2 weeks or become more severe, you may have postpartum depression. How is this diagnosed? This condition is diagnosed based on an evaluation of your symptoms. There are no medical or lab tests that lead to a diagnosis, but there are various questionnaires that a health care provider may use to identify women with the baby blues or postpartum depression. How is this treated? Treatment is not needed for this condition. The baby blues usually go away on their own in 1-2 weeks. Social support is often all that is needed. You will be encouraged to get adequate sleep and rest. Follow these instructions at home: Lifestyle      Get as much rest as you can. Take a nap when the baby  sleeps.  Exercise regularly as told by your health care provider. Some women find yoga and walking to be helpful.  Eat a balanced and nourishing diet. This includes plenty of fruits and vegetables, whole grains, and lean proteins.  Do little things that you enjoy. Have a cup of tea, take a bubble bath, read your favorite magazine, or listen to your favorite music.  Avoid alcohol.  Ask for help with household chores, cooking, grocery shopping, or running errands. Do not try to   try to do everything yourself. Consider hiring a postpartum doula to help. This is a professional who specializes in providing support to new mothers.  Try not to make any major life changes during pregnancy or right after giving birth. This can add stress. General instructions  Talk to people close to you about how you are feeling. Get support from your partner, family members, friends, or other new moms. You may want to join a support group.  Find ways to cope with stress. This may include: ? Writing your thoughts and feelings in a journal. ? Spending time outside. ? Spending time with people who make you laugh.  Try to stay positive in how you think. Think about the things you are grateful for.  Take over-the-counter and prescription medicines only as told by your health care provider.  Let your health care provider know if you have any concerns.  Keep all postpartum visits as told by your health care provider. This is important. Contact a health care provider if:  Your baby blues do not go away after 2 weeks. Get help right away if:  You have thoughts of taking your own life (suicidal thoughts).  You think you may harm the baby or other people.  You see or hear things that are not there (hallucinations). Summary  After giving birth, you may feel happy one minute and sad or stressed the next. Feelings of sadness that happen right after the baby is born and go away after a week or two are called the  "baby blues."  You can manage the baby blues by getting enough rest, eating a healthy diet, exercising, spending time with supportive people, and finding ways to cope with stress.  If feelings of sadness and stress last longer than 2 weeks or get in the way of caring for your baby, talk to your health care provider. This may mean you have postpartum depression. This information is not intended to replace advice given to you by your health care provider. Make sure you discuss any questions you have with your health care provider. Document Revised: 04/29/2018 Document Reviewed: 03/03/2016 Elsevier Patient Education  2020 Elsevier Inc.    Nifedipine Extended-Release Oral Tablets What is this medicine? NIFEDIPINE (nye FED i peen) is a calcium channel blocker. It relaxes your blood vessels and decreases the amount of work the heart has to do. It treats high blood pressure and/or prevents chest pain (also called angina). This medicine may be used for other purposes; ask your health care provider or pharmacist if you have questions. COMMON BRAND NAME(S): Adalat CC, Afeditab CR, Nifediac CC, Nifedical XL, Procardia XL What should I tell my health care provider before I take this medicine? They need to know if you have any of these conditions:  blockage in your bowels  constipation  heart attack  heart disease  heart failure  liver disease  low blood pressure  an unusual or allergic reaction to nifedipine, other drugs, foods, dyes or preservatives  pregnant or trying to get pregnant  breast-feeding How should I use this medicine? Take this drug by mouth. Take it as directed on the prescription label at the same time every day. Do not cut, crush or chew this drug. Swallow the tablets whole. Some tablets need to be taken on an empty stomach. Ask your pharmacist or health care provider if you have any questions. Keep taking it unless your health care provider tells you to stop. Do not  take this drug with grapefruit  juice. Talk to your health care provider about the use of this drug in children. Special care may be needed. Overdosage: If you think you have taken too much of this medicine contact a poison control center or emergency room at once. NOTE: This medicine is only for you. Do not share this medicine with others. What if I miss a dose? If you miss a dose, take it as soon as you can. If it is almost time for your next dose, take only that dose. Do not take double or extra doses. What may interact with this medicine? Do not take this medicine with any of the following medications:  certain medicines for seizures like carbamazepine, phenobarbital, phenytoin  lumacaftor; ivacaftor  rifabutin  rifampin  rifapentine  St. John's Wort This medicine may also interact with the following medications:  antiviral medicines for HIV or AIDS  certain medicines for blood pressure  certain medicines for diabetes  certain medicines for erectile dysfunction  certain medicines for fungal infections like ketoconazole, fluconazole, and itraconazole  certain medicines for irregular heart beat like flecainide and quinidine  certain medicines that treat or prevent blood clots like warfarin  clarithromycin  digoxin  dolasetron  erythromycin  fluoxetine  grapefruit juice  local or general anesthetics  nefazodone  orlistat  quinupristin; dalfopristin  sirolimus  stomach acid blockers like cimetidine, ranitidine, omeprazole, or pantoprazole  tacrolimus  valproic acid This list may not describe all possible interactions. Give your health care provider a list of all the medicines, herbs, non-prescription drugs, or dietary supplements you use. Also tell them if you smoke, drink alcohol, or use illegal drugs. Some items may interact with your medicine. What should I watch for while using this medicine? Visit your health care provider for regular checks on  your progress. Check your blood pressure as directed. Ask your health care provider what your blood pressure should be. Also, find out when you should contact him or her. Do not treat yourself for coughs, colds, or pain while you are using this drug without asking your health care provider for advice. Some drugs may increase your blood pressure. You may get drowsy or dizzy. Do not drive, use machinery, or do anything that needs mental alertness until you know how this drug affects you. Do not stand up or sit up quickly, especially if you are an older patient. This reduces the risk of dizzy or fainting spells. The tablet shell for some brands of this drug does not dissolve. This is normal. The tablet shell may appear whole in the stool. This is not a cause for concern. What side effects may I notice from receiving this medicine? Side effects that you should report to your doctor or health care provider as soon as possible:  allergic reactions (skin rash, itching or hives; swelling of the face, lips, or tongue)  heart attack (trouble breathing; pain or tightness in the chest, neck, back or arms; unusually weak or tired)  heart failure (trouble breathing; fast, irregular heartbeat; sudden weight gain; swelling of the ankles, feet, hands; unusually weak or tired)  low blood pressure (dizziness; feeling faint or lightheaded, falls; unusually weak or tired) Side effects that usually do not require medical attention (report to your doctor or health care provider if they continue or are bothersome):  bloating  changes in emotions or moods  constipation  facial flushing  headache  nasal congestion (like runny or stuffy nose)  nausea  stomach pain This list may not describe all possible  side effects. Call your doctor for medical advice about side effects. You may report side effects to FDA at 1-800-FDA-1088. Where should I keep my medicine? Keep out of the reach of children and pets. Store at  room temperature between 20 and 25 degrees C (68 and 77 degrees F). Protect from light and moisture. Keep the container tightly closed. Throw away any unused drug after the expiration date. NOTE: This sheet is a summary. It may not cover all possible information. If you have questions about this medicine, talk to your doctor, pharmacist, or health care provider.  2020 Elsevier/Gold Standard (2018-11-01 08:24:11)

## 2019-05-16 DIAGNOSIS — Z5181 Encounter for therapeutic drug level monitoring: Secondary | ICD-10-CM | POA: Diagnosis not present

## 2019-05-22 ENCOUNTER — Ambulatory Visit (INDEPENDENT_AMBULATORY_CARE_PROVIDER_SITE_OTHER): Payer: Medicaid Other | Admitting: Certified Nurse Midwife

## 2019-05-22 ENCOUNTER — Other Ambulatory Visit: Payer: Self-pay

## 2019-05-22 ENCOUNTER — Encounter: Payer: Self-pay | Admitting: Certified Nurse Midwife

## 2019-05-22 VITALS — BP 120/75 | HR 98 | Temp 99.1°F | Ht 66.0 in | Wt 168.2 lb

## 2019-05-22 DIAGNOSIS — I10 Essential (primary) hypertension: Secondary | ICD-10-CM

## 2019-05-22 DIAGNOSIS — R61 Generalized hyperhidrosis: Secondary | ICD-10-CM | POA: Diagnosis not present

## 2019-05-22 DIAGNOSIS — R6883 Chills (without fever): Secondary | ICD-10-CM | POA: Diagnosis not present

## 2019-05-22 NOTE — Progress Notes (Signed)
Pt present due to having night sweats, chills and high blood pressure.

## 2019-05-22 NOTE — Progress Notes (Signed)
GYN ENCOUNTER NOTE  Subjective:       Diamond Sutton is a 34 y.o. [id number] female here for postpartum blood pressure check.   Taking procardia daily as prescribed. Reports night sweats and chills for the last three (3) days.   No fever, abdominal tenderness, malodorous discharge or dysuria.    Gynecologic History  No LMP recorded. (Menstrual status: Lactating).  Contraception: bilateral tubal ligation  Last Pap: 2017. Results were: normal per patient.   Obstetric History  OB History  Gravida Para Term Preterm AB Living  10 8 6 2 2 8   SAB TAB Ectopic Multiple Live Births  1 0 1 0 7    # Outcome Date GA Lbr Len/2nd Weight Sex Delivery Anes PTL Lv  10 Preterm 05/08/19 [redacted]w[redacted]d 02:44 / 00:13 5 lb 9.2 oz (2.53 kg) F Vag-Spont EPI  LIV  9 Ectopic 2018          8 Preterm 12/30/14 [redacted]w[redacted]d  6 lb 0.7 oz (2.74 kg) M Vag-Spont EPI  LIV  7 Term 09/11/12 [redacted]w[redacted]d  7 lb 5 oz (3.317 kg) M Vag-Spont   LIV  6 Term 08/05/09 [redacted]w[redacted]d  6 lb 14 oz (3.118 kg) F Vag-Spont   LIV  5 Term 03/19/07 [redacted]w[redacted]d  7 lb 7 oz (3.374 kg) M Vag-Spont   LIV  4 Term 05/05/05 [redacted]w[redacted]d  6 lb 11 oz (3.033 kg) M Vag-Spont   LIV  3 SAB 2006 [redacted]w[redacted]d    SAB     2 Term 05/05/02 [redacted]w[redacted]d  6 lb 13 oz (3.09 kg) M Vag-Spont   LIV  1 Term             Past Medical History:  Diagnosis Date  . Anemia   . Depression 2001  . Ectopic pregnancy   . History of urinary tract infection   . Hypertension   . Ovarian cyst     Past Surgical History:  Procedure Laterality Date  . DILATION AND CURETTAGE OF UTERUS     SAB  . TUBAL LIGATION N/A 05/09/2019   Procedure: POST PARTUM TUBAL LIGATION;  Surgeon: Rubie Maid, MD;  Location: [facility] ORS;  Service: Gynecology;  Laterality: N/A;    Current Outpatient Medications on File Prior to Visit  Medication Sig Dispense Refill  . glucose blood test strip Test blood sugar 4 times per day and PRN. 100 each 12  . NIFEdipine (ADALAT CC) 30 MG 24 hr tablet Take 1 tablet (30 mg total) by mouth daily. 30  tablet 1  . Prenatal Vit-Fe Fumarate-FA (PRENATAL VITAMIN) 27-0.8 MG TABS Take 1 tablet by mouth daily at 6 (six) AM. 100 tablet 0   No current facility-administered medications on file prior to visit.    Allergies  Allergen Reactions  . Penicillins Anaphylaxis    12/2014: received ancef w/o complications  Has patient had a PCN reaction causing immediate rash, facial/tongue/throat swelling, SOB or lightheadedness with hypotension: Yes Has patient had a PCN reaction causing severe rash involving mucus membranes or skin necrosis: Yes Has patient had a PCN reaction that required hospitalization Yes Has patient had a PCN reaction occurring within the last 10 years: No If all of the above answers are "NO", then may proceed with Cephalosporin use.     Social History   Socioeconomic History  . Marital status: Single    Spouse name: Not on file  . Number of children: Not on file  . Years of education: Not on file  . Highest education level:  Not on file  Occupational History  . Not on file  Tobacco Use  . Smoking status: Former Smoker    Packs/day: 0.25    Years: 15.00    Pack years: 3.75    Types: Cigarettes  . Smokeless tobacco: Never Used  Substance and Sexual Activity  . Alcohol use: Not Currently    Comment: Last ETOH use 10/02/2018 - counseled to not  use  . Drug use: Not Currently  . Sexual activity: Not Currently    Birth control/protection: Surgical    Comment: Last used Nexplanon 02/2018  Other Topics Concern  . Not on file  Social History Narrative  . Not on file   Social Determinants of Health   Financial Resource Strain:   . Difficulty of Paying Living Expenses:   Food Insecurity:   . Worried About Programme researcher, broadcasting/film/video in the Last Year:   . Barista in the Last Year:   Transportation Needs:   . Freight forwarder (Medical):   Marland Kitchen Lack of Transportation (Non-Medical):   Physical Activity:   . Days of Exercise per Week:   . Minutes of Exercise per  Session:   Stress:   . Feeling of Stress :   Social Connections:   . Frequency of Communication with Friends and Family:   . Frequency of Social Gatherings with Friends and Family:   . Attends Religious Services:   . Active Member of Clubs or Organizations:   . Attends Banker Meetings:   Marland Kitchen Marital Status:   Intimate Partner Violence: Not At Risk  . Fear of Current or Ex-Partner: No  . Emotionally Abused: No  . Physically Abused: No  . Sexually Abused: No    Family History  Problem Relation Age of Onset  . Diabetes Mother   . Hypertension Mother   . Heart murmur Half-Brother   . Breast cancer Neg Hx   . Ovarian cancer Neg Hx   . Colon cancer Neg Hx   . Heart disease Neg Hx     The following portions of the patient's history were reviewed and updated as appropriate: allergies, current medications, past family history, past medical history, past social history, past surgical history and problem list.  Review of Systems  ROS negative except as noted above. Information obtained from patient.   Objective:   BP 120/75   Pulse 98   Temp 99.1 F (37.3 C) (Oral)   Ht 5\' 6"  (1.676 m)   Wt 168 lb 3.2 oz (76.3 kg)   Breastfeeding No   BMI 27.15 kg/m    CONSTITUTIONAL: Well-developed, well-nourished female in no acute distress.   MUSCULOSKELETAL: Normal range of motion. No tenderness.  No cyanosis, clubbing, or edema.  Assessment:   1. Hypertension, unspecified type  - Urine Culture - CBC  2. Postpartum care and examination  - Urine Culture - CBC  3. Night sweats  - Urine Culture - CBC  4. Chills (without fever)  - Urine Culture - CBC    Plan:   Labs today, see orders.   Continue medication as prescribed.   Reviewed red flag symptoms and when to call.   RTC as previously scheduled or sooner if needed.    , CNM [facility], [facility] 05/22/19 2:01 PM

## 2019-05-22 NOTE — Patient Instructions (Signed)
Postpartum Care After Vaginal Delivery This sheet gives you information about how to care for yourself from the time you deliver your baby to up to 6-12 weeks after delivery (postpartum period). Your health care provider may also give you more specific instructions. If you have problems or questions, contact your health care provider. Follow these instructions at home: Vaginal bleeding  It is normal to have vaginal bleeding (lochia) after delivery. Wear a sanitary pad for vaginal bleeding and discharge. ? During the first week after delivery, the amount and appearance of lochia is often similar to a menstrual period. ? Over the next few weeks, it will gradually decrease to a dry, yellow-brown discharge. ? For most women, lochia stops completely by 4-6 weeks after delivery. Vaginal bleeding can vary from woman to woman.  Change your sanitary pads frequently. Watch for any changes in your flow, such as: ? A sudden increase in volume. ? A change in color. ? Large blood clots.  If you pass a blood clot from your vagina, save it and call your health care provider to discuss. Do not flush blood clots down the toilet before talking with your health care provider.  Do not use tampons or douches until your health care provider says this is safe.  If you are not breastfeeding, your period should return 6-8 weeks after delivery. If you are feeding your child breast milk only (exclusive breastfeeding), your period may not return until you stop breastfeeding. Perineal care  Keep the area between the vagina and the anus (perineum) clean and dry as told by your health care provider. Use medicated pads and pain-relieving sprays and creams as directed.  If you had a cut in the perineum (episiotomy) or a tear in the vagina, check the area for signs of infection until you are healed. Check for: ? More redness, swelling, or pain. ? Fluid or blood coming from the cut or tear. ? Warmth. ? Pus or a bad  smell.  You may be given a squirt bottle to use instead of wiping to clean the perineum area after you go to the bathroom. As you start healing, you may use the squirt bottle before wiping yourself. Make sure to wipe gently.  To relieve pain caused by an episiotomy, a tear in the vagina, or swollen veins in the anus (hemorrhoids), try taking a warm sitz bath 2-3 times a day. A sitz bath is a warm water bath that is taken while you are sitting down. The water should only come up to your hips and should cover your buttocks. Breast care  Within the first few days after delivery, your breasts may feel heavy, full, and uncomfortable (breast engorgement). Milk may also leak from your breasts. Your health care provider can suggest ways to help relieve the discomfort. Breast engorgement should go away within a few days.  If you are breastfeeding: ? Wear a bra that supports your breasts and fits you well. ? Keep your nipples clean and dry. Apply creams and ointments as told by your health care provider. ? You may need to use breast pads to absorb milk that leaks from your breasts. ? You may have uterine contractions every time you breastfeed for up to several weeks after delivery. Uterine contractions help your uterus return to its normal size. ? If you have any problems with breastfeeding, work with your health care provider or lactation consultant.  If you are not breastfeeding: ? Avoid touching your breasts a lot. Doing this can make   your breasts produce more milk. ? Wear a good-fitting bra and use cold packs to help with swelling. ? Do not squeeze out (express) milk. This causes you to make more milk. Intimacy and sexuality  Ask your health care provider when you can engage in sexual activity. This may depend on: ? Your risk of infection. ? How fast you are healing. ? Your comfort and desire to engage in sexual activity.  You are able to get pregnant after delivery, even if you have not had  your period. If desired, talk with your health care provider about methods of birth control (contraception). Medicines  Take over-the-counter and prescription medicines only as told by your health care provider.  If you were prescribed an antibiotic medicine, take it as told by your health care provider. Do not stop taking the antibiotic even if you start to feel better. Activity  Gradually return to your normal activities as told by your health care provider. Ask your health care provider what activities are safe for you.  Rest as much as possible. Try to rest or take a nap while your baby is sleeping. Eating and drinking   Drink enough fluid to keep your urine pale yellow.  Eat high-fiber foods every day. These may help prevent or relieve constipation. High-fiber foods include: ? Whole grain cereals and breads. ? Brown rice. ? Beans. ? Fresh fruits and vegetables.  Do not try to lose weight quickly by cutting back on calories.  Take your prenatal vitamins until your postpartum checkup or until your health care provider tells you it is okay to stop. Lifestyle  Do not use any products that contain nicotine or tobacco, such as cigarettes and e-cigarettes. If you need help quitting, ask your health care provider.  Do not drink alcohol, especially if you are breastfeeding. General instructions  Keep all follow-up visits for you and your baby as told by your health care provider. Most women visit their health care provider for a postpartum checkup within the first 3-6 weeks after delivery. Contact a health care provider if:  You feel unable to cope with the changes that your child brings to your life, and these feelings do not go away.  You feel unusually sad or worried.  Your breasts become red, painful, or hard.  You have a fever.  You have trouble holding urine or keeping urine from leaking.  You have little or no interest in activities you used to enjoy.  You have not  breastfed at all and you have not had a menstrual period for 12 weeks after delivery.  You have stopped breastfeeding and you have not had a menstrual period for 12 weeks after you stopped breastfeeding.  You have questions about caring for yourself or your baby.  You pass a blood clot from your vagina. Get help right away if:  You have chest pain.  You have difficulty breathing.  You have sudden, severe leg pain.  You have severe pain or cramping in your lower abdomen.  You bleed from your vagina so much that you fill more than one sanitary pad in one hour. Bleeding should not be heavier than your heaviest period.  You develop a severe headache.  You faint.  You have blurred vision or spots in your vision.  You have bad-smelling vaginal discharge.  You have thoughts about hurting yourself or your baby. If you ever feel like you may hurt yourself or others, or have thoughts about taking your own life, get help  right away. You can go to the nearest emergency department or call:  Your local emergency services (911 in the [place]  A suicide crisis helpline, such as the [place] at (781)395-2416. This is open 24 hours a day. Summary  The period of time right after you deliver your newborn up to 6-12 weeks after delivery is called the postpartum period.  Gradually return to your normal activities as told by your health care provider.  Keep all follow-up visits for you and your baby as told by your health care provider. This information is not intended to replace advice given to you by your health care provider. Make sure you discuss any questions you have with your health care provider. Document Revised: 01/08/2017 Document Reviewed: 10/19/2016 Elsevier Patient Education  [place].   Nifedipine Oral Capsules What is this medicine? NIFEDIPINE (nye FED i peen) is a calcium channel blocker. It relaxes your blood vessels and decreases the  amount of work the heart has to do. It treats and/or prevents chest pain (also called angina). This medicine may be used for other purposes; ask your health care provider or pharmacist if you have questions. COMMON BRAND NAME(S): Adalat, Procardia What should I tell my health care provider before I take this medicine? They need to know if you have any of these conditions:  heart attack  heart disease  heart failure  high blood pressure  low blood pressure  an unusual or allergic reaction to nifedipine, other drugs, foods, dyes or preservatives  pregnant or trying to get pregnant  breast-feeding How should I use this medicine? Take this drug by mouth. Take it as directed on the prescription label at the same time every day. You can take it with or without food. If it upsets your stomach, take it with food. Keep taking it unless your health care provider tells you to stop. Do not take this drug with grapefruit juice. Talk to your health care provider about the use of this drug in children. Special care may be needed. Overdosage: If you think you have taken too much of this medicine contact a poison control center or emergency room at once. NOTE: This medicine is only for you. Do not share this medicine with others. What if I miss a dose? If you miss a dose, take it as soon as you can. If it is almost time for your next dose, take only that dose. Do not take double or extra doses. What may interact with this medicine? Do not take this medicine with any of the following medications:  certain medicines for seizures like carbamazepine, phenobarbital, phenytoin  lumacaftor; ivacaftor  rifabutin  rifampin  rifapentine  St. John's Wort This medicine may also interact with the following medications:  antiviral medicines for HIV or AIDS  certain medicines for blood pressure  certain medicines for diabetes  certain medicines for erectile dysfunction  certain medicines for  fungal infections like ketoconazole, fluconazole, and itraconazole  certain medicines for irregular heart beat like flecainide and quinidine  certain medicines that treat or prevent blood clots like warfarin  clarithromycin  digoxin  dolasetron  erythromycin  fluoxetine  grapefruit juice  local or general anesthetics  nefazodone  orlistat  quinupristin; dalfopristin  sirolimus  stomach acid blockers like cimetidine, ranitidine, omeprazole, or pantoprazole  tacrolimus  valproic acid This list may not describe all possible interactions. Give your health care provider a list of all the medicines, herbs, non-prescription drugs, or dietary supplements you  use. Also tell them if you smoke, drink alcohol, or use illegal drugs. Some items may interact with your medicine. What should I watch for while using this medicine? Visit your health care provider for regular checks on your progress. Check your blood pressure as directed. Ask your health care provider what your blood pressure should be. Also, find out when you should contact him or her. Do not treat yourself for coughs, colds, or pain while you are using this drug without asking your health care provider for advice. Some drugs may increase your blood pressure. You may get drowsy or dizzy. Do not drive, use machinery, or do anything that needs mental alertness until you know how this drug affects you. Do not stand up or sit up quickly, especially if you are an older patient. This reduces the risk of dizzy or fainting spells. What side effects may I notice from receiving this medicine? Side effects that you should report to your doctor or health care provider as soon as possible:  allergic reactions (skin rash, itching or hives; swelling of the face, lips, or tongue)  heart attack (trouble breathing; pain or tightness in the chest, neck, back or arms; unusually weak or tired)  heart failure (trouble breathing; fast, irregular  heartbeat; sudden weight gain; swelling of the ankles, feet, hands; unusually weak or tired)  low blood pressure (dizziness; feeling faint or lightheaded, falls; unusually weak or tired) Side effects that usually do not require medical attention (report to your doctor or health care provider if they continue or are bothersome):  changes in emotions or moods  cough  facial flushing  headache  heartburn (burning feeling in chest, often after eating or when lying down)  muscle cramps  nasal congestion (like runny or stuffy nose)  nausea  tremors This list may not describe all possible side effects. Call your doctor for medical advice about side effects. You may report side effects to FDA at 1-800-FDA-1088. Where should I keep my medicine? Keep out of the reach of children and pets. Store at room temperature between 15 and 25 degrees C (59 and 77 degrees F). Protect from light and moisture. Keep the container tightly closed. Throw away any unused drug after the expiration date. NOTE: This sheet is a summary. It may not cover all possible information. If you have questions about this medicine, talk to your doctor, pharmacist, or health care provider.  2020 Elsevier/Gold Standard (2018-10-11 19:08:10)

## 2019-05-23 DIAGNOSIS — Z5181 Encounter for therapeutic drug level monitoring: Secondary | ICD-10-CM | POA: Diagnosis not present

## 2019-05-23 LAB — CBC
Hematocrit: 36.9 % (ref 34.0–46.6)
Hemoglobin: 12.6 g/dL (ref 11.1–15.9)
MCH: 29.9 pg (ref 26.6–33.0)
MCHC: 34.1 g/dL (ref 31.5–35.7)
MCV: 87 fL (ref 79–97)
Platelets: 305 10*3/uL (ref 150–450)
RBC: 4.22 x10E6/uL (ref 3.77–5.28)
RDW: 13.2 % (ref 11.7–15.4)
WBC: 4.9 10*3/uL (ref 3.4–10.8)

## 2019-05-24 LAB — URINE CULTURE

## 2019-05-30 DIAGNOSIS — Z5181 Encounter for therapeutic drug level monitoring: Secondary | ICD-10-CM | POA: Diagnosis not present

## 2019-06-01 ENCOUNTER — Inpatient Hospital Stay: Admit: 2019-06-01 | Payer: Medicaid Other

## 2019-06-06 DIAGNOSIS — Z5181 Encounter for therapeutic drug level monitoring: Secondary | ICD-10-CM | POA: Diagnosis not present

## 2019-06-08 ENCOUNTER — Encounter: Payer: Self-pay | Admitting: Certified Nurse Midwife

## 2019-06-08 ENCOUNTER — Other Ambulatory Visit (HOSPITAL_COMMUNITY)
Admission: RE | Admit: 2019-06-08 | Discharge: 2019-06-08 | Disposition: A | Discharge status: Home / Self Care | Payer: Medicaid Other | Source: Ambulatory Visit | Attending: Certified Nurse Midwife | Admitting: Certified Nurse Midwife

## 2019-06-08 ENCOUNTER — Ambulatory Visit (INDEPENDENT_AMBULATORY_CARE_PROVIDER_SITE_OTHER): Payer: Medicaid Other | Admitting: Certified Nurse Midwife

## 2019-06-08 ENCOUNTER — Other Ambulatory Visit: Payer: Self-pay

## 2019-06-08 VITALS — BP 134/87 | HR 59 | Ht 66.0 in | Wt 164.2 lb

## 2019-06-08 DIAGNOSIS — R102 Pelvic and perineal pain: Secondary | ICD-10-CM | POA: Insufficient documentation

## 2019-06-08 DIAGNOSIS — N898 Other specified noninflammatory disorders of vagina: Secondary | ICD-10-CM

## 2019-06-08 NOTE — Patient Instructions (Signed)
Dyspareunia, Female Dyspareunia is pain that is associated with sexual activity. This can affect any part of the genitals or lower abdomen. There are many possible causes of this condition. In some cases, diagnosing the cause of dyspareunia can be difficult. This condition can be mild, moderate, or severe. Depending on the cause, dyspareunia may get better with treatment, but may return (recur) over time. What are the causes?  The cause of this condition is not always known. However, problems that affect the vulva, vagina, uterus, and other organs may cause dyspareunia. Common causes of this condition include:  Vaginal dryness.  Giving birth.  Infection.  Skin changes or conditions.  Side effects of medicines.  Endometriosis. This is when tissue that is like the lining of the uterus grows on the outside of the uterus.  Psychological conditions. These include depression, anxiety, or traumatic experiences.  Allergic reaction. What increases the risk? The following factors may make you more likely to develop this condition:  History of physical or sexual trauma.  Some medicines.  No longer having a monthly period (menopause).  Having recently given birth.  Taking baths using soaps that have perfumes. These can cause irritation.  Douching. What are the signs or symptoms? The main symptom of this condition is pain in any part of your genitals or lower abdomen during or after sex. This may include:  Irritation, burning, or stinging sensations in your vulva.  Discomfort when your vulva or surrounding area is touched.  Aching and throbbing pain that may be constant.  Pain that gets worse when something is inserted into your vagina. How is this diagnosed? This condition may be diagnosed based on:  Your symptoms, including where and when your pain occurs.  Your medical history.  A physical exam. A pelvic exam will most likely be done.  Tests that include ultrasound,  blood tests, and tests that check the body for infection.  Imaging tests, such as X-ray, MRI, and CT scan. You may be referred to a health care provider who specializes in women's health (gynecologist). How is this treated? Treatment depends on the cause of your condition and your symptoms. In most cases, you may need to stop sexual activity until your symptoms go away or get better. Treatment may include:  Lubricants, ointments, and creams.  Physical therapy.  Massage therapy.  Hormonal therapy.  Medicines to: ? Prevent or fight infection. ? Relieve pain. ? Help numb the area. ? Treat depression (antidepressants).  Counseling, which may include sex therapy.  Surgery. Follow these instructions at home: Lifestyle  Wear cotton underwear.  Use water-based lubricants as needed during sex. Avoid oil-based lubricants.  Do not use any products that can cause irritation. This may include certain condoms, spermicides, lubricants, soaps, tampons, vaginal sprays, or douches.  Always practice safe sex. Use a condom to prevent sexually transmitted infections (STIs).  Talk freely with your partner about your condition. General instructions  Take or apply over-the-counter and prescription medicines only as told by your health care provider.  Urinate before you have sex.  Consider joining a support group.  Get the results of any tests you have done. Ask your health care provider, or the department that is doing the procedure, when your results will be ready.  Keep all follow-up visits as told by your health care provider. This is important. Contact a health care provider if:  You have vaginal bleeding after having sex.  You develop a lump at the opening of your vagina even if the   lump is painless.  You have: ? Abnormal discharge from your vagina. ? Vaginal dryness. ? Itchiness or irritation of your vulva or vagina. ? A new rash. ? Symptoms that get worse or do not improve  with treatment. ? A fever. ? Pain when you urinate. ? Blood in your urine. Get help right away if:  You have severe pain in your abdomen during or shortly after sex.  You pass out after sex. Summary  Dyspareunia is pain that is associated with sexual activity. This can affect any part of the genitals or lower abdomen.  There are many causes of this condition. Treatment depends on the cause and your symptoms. In most cases, you may need to stop sexual activity until your symptoms improve.  Take or apply over-the-counter and prescription medicines only as told by your health care provider.  Contact a health care provider if your symptoms get worse or do not improve with treatment.  Keep all follow-up visits as told by your health care provider. This is important. This information is not intended to replace advice given to you by your health care provider. Make sure you discuss any questions you have with your health care provider. Document Revised: 03/14/2018 Document Reviewed: 03/14/2018 Elsevier Patient Education  [place].   Abdominal Pain, Adult Pain in the abdomen (abdominal pain) can be caused by many things. Often, abdominal pain is not serious and it gets better with no treatment or by being treated at home. However, sometimes abdominal pain is serious. Your health care provider will ask questions about your medical history and do a physical exam to try to determine the cause of your abdominal pain. Follow these instructions at home:  Medicines  Take over-the-counter and prescription medicines only as told by your health care provider.  Do not take a laxative unless told by your health care provider. General instructions  Watch your condition for any changes.  Drink enough fluid to keep your urine pale yellow.  Keep all follow-up visits as told by your health care provider. This is important. Contact a health care provider if:  Your abdominal pain changes  or gets worse.  You are not hungry or you lose weight without trying.  You are constipated or have diarrhea for more than 2-3 days.  You have pain when you urinate or have a bowel movement.  Your abdominal pain wakes you up at night.  Your pain gets worse with meals, after eating, or with certain foods.  You are vomiting and cannot keep anything down.  You have a fever.  You have blood in your urine. Get help right away if:  Your pain does not go away as soon as your health care provider told you to expect.  You cannot stop vomiting.  Your pain is only in areas of the abdomen, such as the right side or the left lower portion of the abdomen. Pain on the right side could be caused by appendicitis.  You have bloody or black stools, or stools that look like tar.  You have severe pain, cramping, or bloating in your abdomen.  You have signs of dehydration, such as: ? Dark urine, very little urine, or no urine. ? Cracked lips. ? Dry mouth. ? Sunken eyes. ? Sleepiness. ? Weakness.  You have trouble breathing or chest pain. Summary  Often, abdominal pain is not serious and it gets better with no treatment or by being treated at home. However, sometimes abdominal pain is serious.  Watch  your condition for any changes.  Take over-the-counter and prescription medicines only as told by your health care provider.  Contact a health care provider if your abdominal pain changes or gets worse.  Get help right away if you have severe pain, cramping, or bloating in your abdomen. This information is not intended to replace advice given to you by your health care provider. Make sure you discuss any questions you have with your health care provider. Document Revised: 05/16/2018 Document Reviewed: 05/16/2018 Elsevier Patient Education  2020 [employer].

## 2019-06-09 MED ORDER — TRAMADOL HCL 50 MG PO TABS: 50.0000 mg | ORAL_TABLET | Freq: Two times a day (BID) | ORAL | 0 refills | Status: DC | PRN

## 2019-06-09 NOTE — Progress Notes (Signed)
GYN ENCOUNTER NOTE  Subjective:       Diamond Sutton is a 34 y.o. [id number] female jere for postpartum problem visit.   Status post vaginal birth on 05/08/2019 and bilateral tubal ligation on 05/09/2019.  Reports vaginal odor for the last five (5) days and intermittent lower abdominal/pelvic pain for the last week.   Noticed symptoms after having intercourse.   Denies difficulty breathing or respiratory distress, chest pain, abdominal pain, excessive vaginal bleeding, dysuria, and leg pain or swelling.    Gynecologic History  No LMP recorded. (Menstrual status: Lactating).  Contraception: tubal ligation  Last Pap: 10/2018. Results were: Neg/Neg  Obstetric History  OB History  Gravida Para Term Preterm AB Living  10 8 6 2 2 8   SAB TAB Ectopic Multiple Live Births  1 0 1 0 7    # Outcome Date GA Lbr Len/2nd Weight Sex Delivery Anes PTL Lv  10 Preterm 05/08/19 [redacted]w[redacted]d 02:44 / 00:13 5 lb 9.2 oz (2.53 kg) F Vag-Spont EPI  LIV  9 Ectopic 2018          8 Preterm 12/30/14 [redacted]w[redacted]d  6 lb 0.7 oz (2.74 kg) M Vag-Spont EPI  LIV  7 Term 09/11/12 [redacted]w[redacted]d  7 lb 5 oz (3.317 kg) M Vag-Spont   LIV  6 Term 08/05/09 [redacted]w[redacted]d  6 lb 14 oz (3.118 kg) F Vag-Spont   LIV  5 Term 03/19/07 [redacted]w[redacted]d  7 lb 7 oz (3.374 kg) M Vag-Spont   LIV  4 Term 05/05/05 [redacted]w[redacted]d  6 lb 11 oz (3.033 kg) M Vag-Spont   LIV  3 SAB 2006 [redacted]w[redacted]d    SAB     2 Term 05/05/02 [redacted]w[redacted]d  6 lb 13 oz (3.09 kg) M Vag-Spont   LIV  1 Term             Past Medical History:  Diagnosis Date  . Anemia   . Depression 2001  . Ectopic pregnancy   . History of urinary tract infection   . Hypertension   . Ovarian cyst     Past Surgical History:  Procedure Laterality Date  . DILATION AND CURETTAGE OF UTERUS     SAB  . TUBAL LIGATION N/A 05/09/2019   Procedure: POST PARTUM TUBAL LIGATION;  Surgeon: Rubie Maid, MD;  Location: [facility] ORS;  Service: Gynecology;  Laterality: N/A;    Current Outpatient Medications on File Prior to Visit  Medication  Sig Dispense Refill  . glucose blood test strip Test blood sugar 4 times per day and PRN. 100 each 12  . NIFEdipine (ADALAT CC) 30 MG 24 hr tablet Take 1 tablet (30 mg total) by mouth daily. 30 tablet 1  . Prenatal Vit-Fe Fumarate-FA (PRENATAL VITAMIN) 27-0.8 MG TABS Take 1 tablet by mouth daily at 6 (six) AM. 100 tablet 0   No current facility-administered medications on file prior to visit.    Allergies  Allergen Reactions  . Penicillins Anaphylaxis    12/2014: received ancef w/o complications  Has patient had a PCN reaction causing immediate rash, facial/tongue/throat swelling, SOB or lightheadedness with hypotension: Yes Has patient had a PCN reaction causing severe rash involving mucus membranes or skin necrosis: Yes Has patient had a PCN reaction that required hospitalization Yes Has patient had a PCN reaction occurring within the last 10 years: No If all of the above answers are "NO", then may proceed with Cephalosporin use.     Social History   Socioeconomic History  . Marital status: Single  Spouse name: Not on file  . Number of children: Not on file  . Years of education: Not on file  . Highest education level: Not on file  Occupational History  . Not on file  Tobacco Use  . Smoking status: Former Smoker    Packs/day: 0.25    Years: 15.00    Pack years: 3.75    Types: Cigarettes  . Smokeless tobacco: Never Used  Substance and Sexual Activity  . Alcohol use: Not Currently    Comment: Last ETOH use 10/02/2018 - counseled to not  use  . Drug use: Not Currently  . Sexual activity: Not Currently    Birth control/protection: Surgical    Comment: Last used Nexplanon 02/2018  Other Topics Concern  . Not on file  Social History Narrative  . Not on file   Social Determinants of Health   Financial Resource Strain:   . Difficulty of Paying Living Expenses:   Food Insecurity:   . Worried About Programme researcher, broadcasting/film/video in the Last Year:   . Barista in the Last  Year:   Transportation Needs:   . Freight forwarder (Medical):   Marland Kitchen Lack of Transportation (Non-Medical):   Physical Activity:   . Days of Exercise per Week:   . Minutes of Exercise per Session:   Stress:   . Feeling of Stress :   Social Connections:   . Frequency of Communication with Friends and Family:   . Frequency of Social Gatherings with Friends and Family:   . Attends Religious Services:   . Active Member of Clubs or Organizations:   . Attends Banker Meetings:   Marland Kitchen Marital Status:   Intimate Partner Violence: Not At Risk  . Fear of Current or Ex-Partner: No  . Emotionally Abused: No  . Physically Abused: No  . Sexually Abused: No    Family History  Problem Relation Age of Onset  . Diabetes Mother   . Hypertension Mother   . Heart murmur Half-Brother   . Breast cancer Neg Hx   . Ovarian cancer Neg Hx   . Colon cancer Neg Hx   . Heart disease Neg Hx     The following portions of the patient's history were reviewed and updated as appropriate: allergies, current medications, past family history, past medical history, past social history, past surgical history and problem list.  Review of Systems  ROS negative except as noted above. Information obtained from patient.   Objective:   BP 134/87   Pulse (!) 59   Ht 5\' 6"  (1.676 m)   Wt 164 lb 3 oz (74.5 kg)   BMI 26.50 kg/m    CONSTITUTIONAL: Well-developed, well-nourished female in no acute distress.   ABDOMEN: Soft, non distended; lower abdominal tender to palpation.  No Organomegaly.  PELVIC:  External Genitalia: Normal  Vagina: Yellowish, white discharge present  Cervix: Normal, No CMT   MUSCULOSKELETAL: Normal range of motion. No tenderness.  No cyanosis, clubbing, or edema.  Assessment:   1. Vaginal odor  - Cervicovaginal ancillary only  2. Pelvic pain  - Cervicovaginal ancillary only  3. Postpartum care and examination  - Cervicovaginal ancillary only   Plan:   Vaginal  swab collected, see orders.   Rx Ultram, see orders  Reviewed red flag symptoms and when to call.   RTC as previously scheduled or sooner if needed.    , CNM [facility], [facility]

## 2019-06-12 ENCOUNTER — Other Ambulatory Visit (INDEPENDENT_AMBULATORY_CARE_PROVIDER_SITE_OTHER): Payer: Medicaid Other | Admitting: Certified Nurse Midwife

## 2019-06-12 DIAGNOSIS — N760 Acute vaginitis: Secondary | ICD-10-CM

## 2019-06-12 DIAGNOSIS — B9689 Other specified bacterial agents as the cause of diseases classified elsewhere: Secondary | ICD-10-CM

## 2019-06-12 LAB — CERVICOVAGINAL ANCILLARY ONLY
Bacterial Vaginitis (gardnerella): POSITIVE — AB
Candida Glabrata: NEGATIVE
Candida Vaginitis: NEGATIVE
Chlamydia: NEGATIVE
Comment: NEGATIVE
Comment: NEGATIVE
Comment: NEGATIVE
Comment: NEGATIVE
Comment: NEGATIVE
Comment: NORMAL
Neisseria Gonorrhea: NEGATIVE
Trichomonas: NEGATIVE

## 2019-06-12 MED ORDER — METRONIDAZOLE 0.75 % VA GEL: 1.0000 | Freq: Every day | VAGINAL | 1 refills | Status: DC

## 2019-06-12 NOTE — Progress Notes (Signed)
Rx Metrogel, see orders.    Gunnar Bulla, CNM [facility], [facility] 06/12/19 3:35 PM

## 2019-06-14 DIAGNOSIS — Z5181 Encounter for therapeutic drug level monitoring: Secondary | ICD-10-CM | POA: Diagnosis not present

## 2019-06-20 ENCOUNTER — Encounter: Payer: Medicaid Other | Admitting: Obstetrics and Gynecology

## 2019-06-21 DIAGNOSIS — Z5181 Encounter for therapeutic drug level monitoring: Secondary | ICD-10-CM | POA: Diagnosis not present

## 2019-06-22 ENCOUNTER — Encounter: Payer: Medicaid Other | Admitting: Certified Nurse Midwife

## 2019-06-26 ENCOUNTER — Encounter: Payer: Medicaid Other | Admitting: Certified Nurse Midwife

## 2019-06-27 DIAGNOSIS — Z5181 Encounter for therapeutic drug level monitoring: Secondary | ICD-10-CM | POA: Diagnosis not present

## 2019-07-04 DIAGNOSIS — Z5181 Encounter for therapeutic drug level monitoring: Secondary | ICD-10-CM | POA: Diagnosis not present

## 2019-07-07 ENCOUNTER — Encounter: Payer: Medicaid Other | Admitting: Certified Nurse Midwife

## 2019-07-11 DIAGNOSIS — Z5181 Encounter for therapeutic drug level monitoring: Secondary | ICD-10-CM | POA: Diagnosis not present

## 2019-07-18 ENCOUNTER — Ambulatory Visit (INDEPENDENT_AMBULATORY_CARE_PROVIDER_SITE_OTHER): Payer: Medicaid Other | Admitting: Certified Nurse Midwife

## 2019-07-18 ENCOUNTER — Other Ambulatory Visit: Payer: Self-pay

## 2019-07-18 ENCOUNTER — Encounter: Payer: Medicaid Other | Admitting: Obstetrics and Gynecology

## 2019-07-18 ENCOUNTER — Other Ambulatory Visit (HOSPITAL_COMMUNITY)
Admission: RE | Admit: 2019-07-18 | Discharge: 2019-07-18 | Disposition: A | Discharge status: Home / Self Care | Payer: Medicaid Other | Source: Ambulatory Visit | Attending: Obstetrics and Gynecology | Admitting: Obstetrics and Gynecology

## 2019-07-18 ENCOUNTER — Encounter: Payer: Self-pay | Admitting: Certified Nurse Midwife

## 2019-07-18 DIAGNOSIS — N920 Excessive and frequent menstruation with regular cycle: Secondary | ICD-10-CM

## 2019-07-18 DIAGNOSIS — N898 Other specified noninflammatory disorders of vagina: Secondary | ICD-10-CM

## 2019-07-18 MED ORDER — MEDROXYPROGESTERONE ACETATE 150 MG/ML IM SUSP: 150.0000 mg | Freq: Once | INTRAMUSCULAR | 3 refills | Status: DC

## 2019-07-18 NOTE — Progress Notes (Signed)
Subjective:    Diamond Sutton is a 34 y.o. [id number] African American female who presents for a postpartum visit. She is 6 weeks postpartum following a spontaneous vaginal delivery and postpartum tubal ligation at 36.4 gestational weeks. Anesthesia: spinal for tubal. I have fully reviewed the prenatal and intrapartum course. Postpartum course has been WNL. Baby's course has been WNL. Baby is feeding by breast and bottle . Bleeding no bleeding. Has had 2 periods that have been heavy and long. Bowel function is normal. Bladder function is normal. Patient is sexually active.  Contraception method is tubal ligation. Postpartum depression screening: negative. Score 0.  Last pap 11/15/18  and was normal. She complains of vaginal discharge with itching and irritation. Swab collected. The following portions of the patient's history were reviewed and updated as appropriate: allergies, current medications, past medical history, past surgical history and problem list.  Review of Systems Pertinent items are noted in HPI.   Vitals:   07/18/19 1559  BP: (!) 139/93  Pulse: 73  Weight: 160 lb 6 oz (72.7 kg)  Height: 5\' 6"  (1.676 m)   Patient's last menstrual period was 07/14/2019 (exact date).  Objective:   General:  alert, cooperative and no distress   Breasts:  deferred, no complaints  Lungs: clear to auscultation bilaterally  Heart:  regular rate and rhythm  Abdomen: soft, nontender   Vulva: normal  Vagina: normal vagina  Cervix:  closed  Corpus: Well-involuted  Adnexa:  Non-palpable  Rectal Exam: nohemorrhoids        Assessment:   Postpartum exam 6 wks s/p SVD Breast & bottle feeding Depression screening Contraception counseling   Plan:  : tubal ligation, request depo for bleeding management with her period. She states her periods have been heavy and long since having baby. She used depo in the past had had little to no bleeding. Orders placed. Vaginal swab collected for increased  vaginal discharge with itching. Will follow up with results.  Follow up in: 1 year for annual or earlier if needed  07/16/2019,  CNM

## 2019-07-18 NOTE — Patient Instructions (Signed)
Preventive Care 21-36 Years Old, Female Preventive care refers to visits with your health care provider and lifestyle choices that can promote health and wellness. This includes:  A yearly physical exam. This may also be called an annual well check.  Regular dental visits and eye exams.  Immunizations.  Screening for certain conditions.  Healthy lifestyle choices, such as eating a healthy diet, getting regular exercise, not using drugs or products that contain nicotine and tobacco, and limiting alcohol use. What can I expect for my preventive care visit? Physical exam Your health care provider will check your:  Height and weight. This may be used to calculate body mass index (BMI), which tells if you are at a healthy weight.  Heart rate and blood pressure.  Skin for abnormal spots. Counseling Your health care provider may ask you questions about your:  Alcohol, tobacco, and drug use.  Emotional well-being.  Home and relationship well-being.  Sexual activity.  Eating habits.  Work and work environment.  Method of birth control.  Menstrual cycle.  Pregnancy history. What immunizations do I need?  Influenza (flu) vaccine  This is recommended every year. Tetanus, diphtheria, and pertussis (Tdap) vaccine  You may need a Td booster every 10 years. Varicella (chickenpox) vaccine  You may need this if you have not been vaccinated. Human papillomavirus (HPV) vaccine  If recommended by your health care provider, you may need three doses over 6 months. Measles, mumps, and rubella (MMR) vaccine  You may need at least one dose of MMR. You may also need a second dose. Meningococcal conjugate (MenACWY) vaccine  One dose is recommended if you are age 19-21 years and a first-year college student living in a residence hall, or if you have one of several medical conditions. You may also need additional booster doses. Pneumococcal conjugate (PCV13) vaccine  You may need  this if you have certain conditions and were not previously vaccinated. Pneumococcal polysaccharide (PPSV23) vaccine  You may need one or two doses if you smoke cigarettes or if you have certain conditions. Hepatitis A vaccine  You may need this if you have certain conditions or if you travel or work in places where you may be exposed to hepatitis A. Hepatitis B vaccine  You may need this if you have certain conditions or if you travel or work in places where you may be exposed to hepatitis B. Haemophilus influenzae type b (Hib) vaccine  You may need this if you have certain conditions. You may receive vaccines as individual doses or as more than one vaccine together in one shot (combination vaccines). Talk with your health care provider about the risks and benefits of combination vaccines. What tests do I need?  Blood tests  Lipid and cholesterol levels. These may be checked every 5 years starting at age 20.  Hepatitis C test.  Hepatitis B test. Screening  Diabetes screening. This is done by checking your blood sugar (glucose) after you have not eaten for a while (fasting).  Sexually transmitted disease (STD) testing.  BRCA-related cancer screening. This may be done if you have a family history of breast, ovarian, tubal, or peritoneal cancers.  Pelvic exam and Pap test. This may be done every 3 years starting at age 21. Starting at age 30, this may be done every 5 years if you have a Pap test in combination with an HPV test. Talk with your health care provider about your test results, treatment options, and if necessary, the need for more tests.   Follow these instructions at home: Eating and drinking   Eat a diet that includes fresh fruits and vegetables, whole grains, lean protein, and low-fat dairy.  Take vitamin and mineral supplements as recommended by your health care provider.  Do not drink alcohol if: ? Your health care provider tells you not to drink. ? You are  pregnant, may be pregnant, or are planning to become pregnant.  If you drink alcohol: ? Limit how much you have to 0-1 drink a day. ? Be aware of how much alcohol is in your drink. In the [place] one drink equals one 12 oz bottle of beer (355 mL), one 5 oz glass of wine (148 mL), or one 1 oz glass of hard liquor (44 mL). Lifestyle  Take daily care of your teeth and gums.  Stay active. Exercise for at least 30 minutes on 5 or more days each week.  Do not use any products that contain nicotine or tobacco, such as cigarettes, e-cigarettes, and chewing tobacco. If you need help quitting, ask your health care provider.  If you are sexually active, practice safe sex. Use a condom or other form of birth control (contraception) in order to prevent pregnancy and STIs (sexually transmitted infections). If you plan to become pregnant, see your health care provider for a preconception visit. What's next?  Visit your health care provider once a year for a well check visit.  Ask your health care provider how often you should have your eyes and teeth checked.  Stay up to date on all vaccines. This information is not intended to replace advice given to you by your health care provider. Make sure you discuss any questions you have with your health care provider. Document Revised: 09/16/2017 Document Reviewed: 09/16/2017 Elsevier Patient Education  2020 [employer].

## 2019-07-20 DIAGNOSIS — Z419 Encounter for procedure for purposes other than remedying health state, unspecified: Secondary | ICD-10-CM | POA: Diagnosis not present

## 2019-07-20 LAB — CERVICOVAGINAL ANCILLARY ONLY
Bacterial Vaginitis (gardnerella): POSITIVE — AB
Candida Glabrata: NEGATIVE
Candida Vaginitis: POSITIVE — AB
Chlamydia: NEGATIVE
Comment: NEGATIVE
Comment: NEGATIVE
Comment: NEGATIVE
Comment: NEGATIVE
Comment: NEGATIVE
Comment: NORMAL
Neisseria Gonorrhea: NEGATIVE
Trichomonas: NEGATIVE

## 2019-07-25 ENCOUNTER — Other Ambulatory Visit: Payer: Self-pay | Admitting: Certified Nurse Midwife

## 2019-07-25 MED ORDER — FLUCONAZOLE 150 MG PO TABS: 150.0000 mg | ORAL_TABLET | Freq: Once | ORAL | 0 refills | Status: AC

## 2019-07-25 MED ORDER — METRONIDAZOLE 500 MG PO TABS: 500.0000 mg | ORAL_TABLET | Freq: Two times a day (BID) | ORAL | 0 refills | Status: AC

## 2019-07-25 NOTE — Progress Notes (Signed)
Vaginal swab positive for yeast and BV, orders placed for treatment.   Doreene Burke, CNM

## 2019-07-27 ENCOUNTER — Ambulatory Visit: Payer: Medicaid Other

## 2019-08-16 ENCOUNTER — Other Ambulatory Visit: Payer: Self-pay | Admitting: Certified Nurse Midwife

## 2019-08-20 DIAGNOSIS — Z419 Encounter for procedure for purposes other than remedying health state, unspecified: Secondary | ICD-10-CM | POA: Diagnosis not present

## 2019-08-28 ENCOUNTER — Other Ambulatory Visit: Payer: Self-pay | Admitting: Certified Nurse Midwife

## 2019-08-30 ENCOUNTER — Ambulatory Visit: Payer: Medicaid Other

## 2019-09-05 ENCOUNTER — Ambulatory Visit: Payer: Medicaid Other

## 2019-09-06 ENCOUNTER — Ambulatory Visit: Payer: Medicaid Other

## 2019-09-06 ENCOUNTER — Ambulatory Visit: Payer: Medicaid Other | Admitting: Physician Assistant

## 2019-09-06 ENCOUNTER — Other Ambulatory Visit: Payer: Self-pay

## 2019-09-06 DIAGNOSIS — Z113 Encounter for screening for infections with a predominantly sexual mode of transmission: Secondary | ICD-10-CM

## 2019-09-06 DIAGNOSIS — B373 Candidiasis of vulva and vagina: Secondary | ICD-10-CM

## 2019-09-06 DIAGNOSIS — B3731 Acute candidiasis of vulva and vagina: Secondary | ICD-10-CM

## 2019-09-06 DIAGNOSIS — Z9851 Tubal ligation status: Secondary | ICD-10-CM

## 2019-09-06 DIAGNOSIS — L0292 Furuncle, unspecified: Secondary | ICD-10-CM

## 2019-09-06 LAB — WET PREP FOR TRICH, YEAST, CLUE
Trichomonas Exam: NEGATIVE
Yeast Exam: NEGATIVE

## 2019-09-08 ENCOUNTER — Encounter: Payer: Medicaid Other | Admitting: Certified Nurse Midwife

## 2019-09-08 ENCOUNTER — Encounter: Payer: Self-pay | Admitting: Physician Assistant

## 2019-09-08 DIAGNOSIS — Z9851 Tubal ligation status: Secondary | ICD-10-CM | POA: Insufficient documentation

## 2019-09-08 MED ORDER — CLOTRIMAZOLE 1 % VA CREA: 1.0000 | TOPICAL_CREAM | Freq: Every day | VAGINAL | 0 refills | Status: AC

## 2019-09-08 MED ORDER — DOXYCYCLINE HYCLATE 100 MG PO TABS: 100.0000 mg | ORAL_TABLET | Freq: Two times a day (BID) | ORAL | 0 refills | Status: AC

## 2019-09-08 NOTE — Progress Notes (Addendum)
[facility] Department STI clinic/screening visit  Subjective:  KRISTINIA LEAVY is a 34 y.o. female being seen today for an STI screening visit. The patient reports they do have symptoms.  Patient reports that they do not desire a pregnancy in the next year.   They reported they are not interested in discussing contraception today.  No LMP recorded. (Menstrual status: Lactating).   Patient has the following medical conditions:   Patient Active Problem List   Diagnosis Date Noted  . History of bilateral tubal ligation 09/08/2019  . Vaginal discharge 07/18/2019  . Heavy period 07/18/2019  . Itching 11/17/2016    Chief Complaint  Patient presents with  . SEXUALLY TRANSMITTED DISEASE    screening    HPI  Patient reports that she was treated 2 weeks ago for BV and yeast and wants to see if they have cleared up.  States she would also like a full screening.  Reports lower belly pains off and on for 1 week.  States that she also has a boil in her genital area.  Last HIV testing was about 2 weeks ago and last pap was in 2020.  Has had a BTL as BCM.  Patient states that she recently stopped breastfeeding and is now using formula.   See flowsheet for further details and programmatic requirements.    The following portions of the patient's history were reviewed and updated as appropriate: allergies, current medications, past medical history, past social history, past surgical history and problem list.  Objective:  There were no vitals filed for this visit.  Physical Exam Constitutional:      General: She is not in acute distress.    Appearance: Normal appearance.  HENT:     Head: Normocephalic and atraumatic.     Comments: No nits,lice, or hair loss. No cervical, supraclavicular or axillary adenopathy.    Mouth/Throat:     Mouth: Mucous membranes are moist.     Pharynx: Oropharynx is clear. No oropharyngeal exudate or posterior oropharyngeal erythema.  Eyes:      Conjunctiva/sclera: Conjunctivae normal.  Pulmonary:     Effort: Pulmonary effort is normal.  Abdominal:     Palpations: Abdomen is soft. There is no mass.     Tenderness: There is no abdominal tenderness. There is no guarding or rebound.  Genitourinary:    General: Normal vulva.     Rectum: Normal.     Comments: External genitalia/pubic area without nits, lice, edema, erythema, lesions and inguinal adenopathy. R side in inguinal fold, beside labia majora and perineal area with ~1.5 cm tender, fluctuant area without exudate. Vagina with moderate amount of white/bloody clumping discharge. Cervix without visible lesions. Uterus firm, mobile, nt, no masses, no CMT, no adnexal tenderness or fullness. Musculoskeletal:     Cervical back: Neck supple. No tenderness.  Skin:    General: Skin is warm and dry.     Findings: No bruising, erythema, lesion or rash.  Neurological:     Mental Status: She is alert and oriented to person, place, and time.  Psychiatric:        Mood and Affect: Mood normal.        Behavior: Behavior normal.        Thought Content: Thought content normal.        Judgment: Judgment normal.      Assessment and Plan:  KAYLEN MOTL is a 34 y.o. female presenting to the [facility] Department for STI screening  1. Screening  for STD (sexually transmitted disease) Patient into clinic with symptoms. Rec condoms with all sex. Await test results.  Counseled that RN will call if needs to RTC for treatment once results are back. - WET PREP FOR TRICH, YEAST, CLUE - Chlamydia/Gonorrhea [place] [facility] - HIV [facility] - Syphilis Serology, [facility]  2. Furuncle Patient requests antibiotic treatment for boil and is allergic to PCN with anaphylaxis.  Will give Rx for Doxycycline 100 mg #20 1 po BID for 10 days. Rec continue with warm soaks for pain relief. Follow up with PCP/urgent care if it does not clear for I and D. - doxycycline (VIBRA-TABS) 100 MG  tablet; Take 1 tablet (100 mg total) by mouth 2 (two) times daily for 10 days.  Dispense: 20 tablet; Refill: 0  3. Candida vaginitis Treat for yeast with Clotrimazole 1% vaginal cream 1 app qhs for 7 days. No sex for 7 days. - clotrimazole (CLOTRIMAZOLE-7) 1 % vaginal cream; Place 1 Applicatorful vaginally at bedtime for 7 days.  Dispense: 45 g; Refill: 0     No follow-ups on file.  No future appointments.  Matt Holmes, PA

## 2019-09-13 ENCOUNTER — Telehealth: Payer: Self-pay

## 2019-09-13 ENCOUNTER — Ambulatory Visit: Payer: Self-pay

## 2019-09-13 ENCOUNTER — Other Ambulatory Visit: Payer: Self-pay

## 2019-09-13 DIAGNOSIS — A5602 Chlamydial vulvovaginitis: Secondary | ICD-10-CM

## 2019-09-13 MED ORDER — AZITHROMYCIN 500 MG PO TABS
1000.0000 mg | ORAL_TABLET | Freq: Once | ORAL | Status: AC
  Administered 2019-09-13: 1000 mg via ORAL

## 2019-09-13 NOTE — Telephone Encounter (Signed)
Phone call to pt. Pt confirmed password from last visit. Pt counseled about positive chlamydia results. Per pt preference, scheduled for tx today, 09/13/19. Pt to eat a meal before coming in for tx.  Pt to arrive by 2:45 for 3:00 appt.

## 2019-09-15 ENCOUNTER — Telehealth: Payer: Self-pay | Admitting: Family Medicine

## 2019-09-15 NOTE — Telephone Encounter (Signed)
Returned patient phone call. Patient asking if "the medicine I got the other day will it work if I drank alcohol?" Reviewed patient chart. Patient kept 09/13/2019 OV for Tx of Chlamydia. Did not inform RN of nausea or vomiting with medication. Per chart received 1000mg  of Azithromycin. RN explained to patient that medication will still be effective for Chlamydia Tx but to not have sex for 7 days for herself and then to not have sex for 7 days until after her partner has been treated. Patient verbalized understanding of all of the above. No further questions. , RN

## 2019-09-15 NOTE — Telephone Encounter (Signed)
I have questions about my meds

## 2019-09-18 ENCOUNTER — Telehealth: Payer: Self-pay | Admitting: Family Medicine

## 2019-09-18 NOTE — Telephone Encounter (Signed)
Phone call to pt. Pt states she was treated for chlamydia and did vomit approximately 1 hour later. Pt counseled that she would need retreatement. Pt states she still has the odor that she had before, but further stated her abdominal pain is worsening. Pt scheduled with provider to evaluate worsening abdominal pain. Pt counseled that she should go to ER if she starts to have severe pain or fever. Pt counseled to eat a meal before coming in for appt on 09/19/19.

## 2019-09-18 NOTE — Telephone Encounter (Signed)
Question about medication

## 2019-09-19 ENCOUNTER — Ambulatory Visit: Payer: Medicaid Other | Admitting: Advanced Practice Midwife

## 2019-09-19 ENCOUNTER — Other Ambulatory Visit: Payer: Self-pay

## 2019-09-19 ENCOUNTER — Encounter: Payer: Self-pay | Admitting: Advanced Practice Midwife

## 2019-09-19 DIAGNOSIS — Z113 Encounter for screening for infections with a predominantly sexual mode of transmission: Secondary | ICD-10-CM | POA: Diagnosis not present

## 2019-09-19 DIAGNOSIS — F17200 Nicotine dependence, unspecified, uncomplicated: Secondary | ICD-10-CM

## 2019-09-19 LAB — WET PREP FOR TRICH, YEAST, CLUE
Trichomonas Exam: NEGATIVE
Yeast Exam: NEGATIVE

## 2019-09-19 MED ORDER — AZITHROMYCIN 500 MG PO TABS
1000.0000 mg | ORAL_TABLET | Freq: Once | ORAL | Status: AC
  Administered 2019-09-19: 1000 mg via ORAL

## 2019-09-19 NOTE — Progress Notes (Signed)
Here today for STD screening. Declines bloodwork. Sarah Baez, RN ? ?

## 2019-09-19 NOTE — Progress Notes (Signed)
[facility] Department STI clinic/screening visit  Subjective:  Diamond Sutton is a 34 y.o.SBF smoker [id number]  female being seen today for an STI screening visit. The patient reports they do have symptoms.  Patient reports that they do not desire a pregnancy in the next year.   They reported they are not interested in discussing contraception today.  Patient's last menstrual period was 09/07/2019 (exact date).   Patient has the following medical conditions:   Patient Active Problem List   Diagnosis Date Noted  . Smoker 09/19/2019  . History of bilateral tubal ligation 05/09/19 09/08/2019  . Heavy period 07/18/2019    Chief Complaint  Patient presents with  . SEXUALLY TRANSMITTED DISEASE    Screening    HPI  Patient reports still having sxs since +Chlamydia 09/06/19, treated 09/13/19 and vomited 1 hour after partner treated yesterday.  BTL 05/09/19.  LMP 09/07/19. Smoking 2 cpd.  Last ETOH 09/15/19 (2 shots liquor +1 beer) 1x/mo.  Last MJ age 5.  Last sex 08/23/19 without condom; with current partner x 8 years off and on.  C/o malodor since 08/30/19.  Last HIV test per patient/review of record was 09/06/19 Patient reports last pap was 2020  See flowsheet for further details and programmatic requirements.    The following portions of the patient's history were reviewed and updated as appropriate: allergies, current medications, past medical history, past social history, past surgical history and problem list.  Objective:  There were no vitals filed for this visit.  Physical Exam Vitals and nursing note reviewed.  Constitutional:      Appearance: Normal appearance.  HENT:     Head: Normocephalic and atraumatic.     Mouth/Throat:     Mouth: Mucous membranes are moist.     Pharynx: Oropharynx is clear. No oropharyngeal exudate or posterior oropharyngeal erythema.  Eyes:     Conjunctiva/sclera: Conjunctivae normal.  Pulmonary:     Effort: Pulmonary effort is normal.   Abdominal:     Palpations: Abdomen is soft. There is no mass.     Tenderness: There is no abdominal tenderness. There is no rebound.     Comments: Soft without tenderness, no grimacing  Genitourinary:    General: Normal vulva.     Exam position: Lithotomy position.     Pubic Area: No rash or pubic lice.      Labia:        Right: No rash or lesion.        Left: No rash or lesion.      Vagina: Normal. No vaginal discharge (grey malodorous leukorrhea, ph>4.5), erythema, bleeding or lesions.     Cervix: Normal.     Uterus: Normal.      Adnexa: Right adnexa normal and left adnexa normal.     Rectum: Normal.     Comments: Bimanual exam without tenderness, grimacing, or sign of pain Lymphadenopathy:     Head:     Right side of head: No preauricular or posterior auricular adenopathy.     Left side of head: No preauricular or posterior auricular adenopathy.     Cervical: No cervical adenopathy.     Upper Body:     Right upper body: No supraclavicular or axillary adenopathy.     Left upper body: No supraclavicular or axillary adenopathy.     Lower Body: No right inguinal adenopathy. No left inguinal adenopathy.  Skin:    General: Skin is warm and dry.     Findings: No rash.  Neurological:     Mental Status: She is alert and oriented to person, place, and time.      Assessment and Plan:  [name] is a 34 y.o. female presenting to the [facility] Department for STI screening  1. Screening examination for venereal disease Treat wet mount per standing orders  Immunization nurse consult Please retreat for Chlamydia per standing orders (vomited after tx on 09/06/19) - WET PREP FOR TRICH, YEAST, CLUE  2. Smoker Counseled via 5 A's to stop smoking     No follow-ups on file.  No future appointments.  Alberteen Spindle, CNM

## 2019-09-19 NOTE — Progress Notes (Signed)
[employer] results reviewed. Per standing orders no treatment indicated. Retreated for +Chlamydia. Tawny Hopping, RN

## 2019-09-20 DIAGNOSIS — Z419 Encounter for procedure for purposes other than remedying health state, unspecified: Secondary | ICD-10-CM | POA: Diagnosis not present

## 2019-09-23 ENCOUNTER — Other Ambulatory Visit: Payer: Self-pay | Admitting: Certified Nurse Midwife

## 2019-10-20 DIAGNOSIS — Z419 Encounter for procedure for purposes other than remedying health state, unspecified: Secondary | ICD-10-CM | POA: Diagnosis not present

## 2019-11-09 ENCOUNTER — Ambulatory Visit: Payer: Medicaid Other

## 2019-11-10 ENCOUNTER — Ambulatory Visit: Payer: Medicaid Other

## 2019-11-13 ENCOUNTER — Ambulatory Visit: Payer: Medicaid Other

## 2019-11-13 ENCOUNTER — Encounter: Payer: Self-pay | Admitting: Physician Assistant

## 2019-11-13 ENCOUNTER — Other Ambulatory Visit: Payer: Self-pay

## 2019-11-13 ENCOUNTER — Ambulatory Visit: Payer: Medicaid Other | Admitting: Physician Assistant

## 2019-11-13 DIAGNOSIS — Z113 Encounter for screening for infections with a predominantly sexual mode of transmission: Secondary | ICD-10-CM | POA: Diagnosis not present

## 2019-11-13 DIAGNOSIS — Z299 Encounter for prophylactic measures, unspecified: Secondary | ICD-10-CM

## 2019-11-13 LAB — WET PREP FOR TRICH, YEAST, CLUE
Trichomonas Exam: NEGATIVE
Yeast Exam: NEGATIVE

## 2019-11-13 MED ORDER — METRONIDAZOLE 500 MG PO TABS: 2000.0000 mg | ORAL_TABLET | Freq: Once | ORAL | 0 refills | Status: AC

## 2019-11-13 NOTE — Progress Notes (Signed)
Wet mount reviewed with provider and is negative today. Pt received Metronidazole 2g per Sadie Haber, PA verbal and written order. Counseled pt per provider orders and pt states understanding. Provider orders completed.

## 2019-11-13 NOTE — Progress Notes (Signed)
[facility] Department STI clinic/screening visit  Subjective:  Diamond Sutton is a 34 y.o. female being seen today for an STI screening visit. The patient reports they do have symptoms.  Patient reports that they do not desire a pregnancy in the next year.   They reported they are not interested in discussing contraception today.  No LMP recorded.   Patient has the following medical conditions:   Patient Active Problem List   Diagnosis Date Noted  . Smoker 09/19/2019  . History of bilateral tubal ligation 05/09/19 09/08/2019  . Heavy period 07/18/2019    Chief Complaint  Patient presents with  . SEXUALLY TRANSMITTED DISEASE    screening    HPI  Patient reports that she has had abdominal cramping off and on for 1 week and vaginal odor and internal itching for 1.5 weeks.  Denies other symptoms.  States that she takes meds regularly for HTN and has had a BTL as her BCM.  States last HIV test was 08/2019 and last pap was in 2020.   See flowsheet for further details and programmatic requirements.    The following portions of the patient's history were reviewed and updated as appropriate: allergies, current medications, past medical history, past social history, past surgical history and problem list.  Objective:  There were no vitals filed for this visit.  Physical Exam Constitutional:      General: She is not in acute distress.    Appearance: Normal appearance.  HENT:     Head: Normocephalic and atraumatic.     Comments: No nits,lice, or hair loss. No cervical, supraclavicular or axillary adenopathy.    Mouth/Throat:     Mouth: Mucous membranes are moist.     Pharynx: Oropharynx is clear. No oropharyngeal exudate or posterior oropharyngeal erythema.  Eyes:     Conjunctiva/sclera: Conjunctivae normal.  Pulmonary:     Effort: Pulmonary effort is normal.  Abdominal:     Palpations: Abdomen is soft. There is no mass.     Tenderness: There is no abdominal  tenderness. There is no guarding or rebound.  Genitourinary:    General: Normal vulva.     Rectum: Normal.     Comments: External genitalia/pubic area without nits, lice, edema, erythema, lesions and inguinal adenopathy. Vagina with normal mucosa and discharge. Cervix without visible lesions. Uterus firm, mobile, nt, no masses, no CMT, no adnexal tenderness or fullness. Musculoskeletal:     Cervical back: Neck supple. No tenderness.  Skin:    General: Skin is warm and dry.     Findings: No bruising, erythema, lesion or rash.  Neurological:     Mental Status: She is alert and oriented to person, place, and time.  Psychiatric:        Mood and Affect: Mood normal.        Thought Content: Thought content normal.        Judgment: Judgment normal.      Assessment and Plan:  Diamond Sutton is a 34 y.o. female presenting to the [facility] Department for STI screening  1. Screening for STD (sexually transmitted disease) Patient into clinic with symptoms. Patient declines blood work today. Rec condoms with all sex. Await test results.  Counseled that RN will call if needs to RTC for treatment once results are back. - WET PREP FOR TRICH, YEAST, CLUE - Chlamydia/Gonorrhea  Lab  2. Prophylactic measure Due to patient having symptoms will give Metronidazole 2 g po at one time with  food, no EtOH for 24 hr before and until 72 hr after taking medicine. No sex for 7 days. Enc to use OTC antifungal cream if has itching after antibiotic use. - metroNIDAZOLE (FLAGYL) 500 MG tablet; Take 4 tablets (2,000 mg total) by mouth once for 1 dose.  Dispense: 4 tablet; Refill: 0     Return if symptoms worsen or fail to improve.  No future appointments.  Matt Holmes, PA

## 2019-11-20 ENCOUNTER — Other Ambulatory Visit: Payer: Self-pay

## 2019-11-20 DIAGNOSIS — Z419 Encounter for procedure for purposes other than remedying health state, unspecified: Secondary | ICD-10-CM | POA: Diagnosis not present

## 2019-11-21 ENCOUNTER — Emergency Department: Payer: Worker's Compensation

## 2019-11-21 ENCOUNTER — Other Ambulatory Visit: Payer: Self-pay

## 2019-11-21 ENCOUNTER — Emergency Department
Admission: EM | Admit: 2019-11-21 | Discharge: 2019-11-22 | Disposition: A | Discharge status: Home / Self Care | Payer: Worker's Compensation | Attending: Emergency Medicine | Admitting: Emergency Medicine

## 2019-11-21 ENCOUNTER — Telehealth: Payer: Self-pay

## 2019-11-21 DIAGNOSIS — F17210 Nicotine dependence, cigarettes, uncomplicated: Secondary | ICD-10-CM | POA: Diagnosis not present

## 2019-11-21 DIAGNOSIS — Y9389 Activity, other specified: Secondary | ICD-10-CM | POA: Diagnosis not present

## 2019-11-21 DIAGNOSIS — I10 Essential (primary) hypertension: Secondary | ICD-10-CM | POA: Diagnosis not present

## 2019-11-21 DIAGNOSIS — Z203 Contact with and (suspected) exposure to rabies: Secondary | ICD-10-CM | POA: Insufficient documentation

## 2019-11-21 DIAGNOSIS — S61258A Open bite of other finger without damage to nail, initial encounter: Secondary | ICD-10-CM

## 2019-11-21 DIAGNOSIS — Y9289 Other specified places as the place of occurrence of the external cause: Secondary | ICD-10-CM | POA: Diagnosis not present

## 2019-11-21 DIAGNOSIS — W540XXA Bitten by dog, initial encounter: Secondary | ICD-10-CM | POA: Insufficient documentation

## 2019-11-21 DIAGNOSIS — S61253A Open bite of left middle finger without damage to nail, initial encounter: Secondary | ICD-10-CM | POA: Insufficient documentation

## 2019-11-21 DIAGNOSIS — Y990 Civilian activity done for income or pay: Secondary | ICD-10-CM | POA: Insufficient documentation

## 2019-11-21 DIAGNOSIS — Z79899 Other long term (current) drug therapy: Secondary | ICD-10-CM | POA: Diagnosis not present

## 2019-11-21 DIAGNOSIS — S71102A Unspecified open wound, left thigh, initial encounter: Secondary | ICD-10-CM | POA: Diagnosis present

## 2019-11-21 DIAGNOSIS — Z2914 Encounter for prophylactic rabies immune globin: Secondary | ICD-10-CM | POA: Insufficient documentation

## 2019-11-21 DIAGNOSIS — S71152A Open bite, left thigh, initial encounter: Secondary | ICD-10-CM | POA: Diagnosis not present

## 2019-11-21 DIAGNOSIS — G8911 Acute pain due to trauma: Secondary | ICD-10-CM | POA: Diagnosis not present

## 2019-11-21 DIAGNOSIS — Z23 Encounter for immunization: Secondary | ICD-10-CM | POA: Insufficient documentation

## 2019-11-21 LAB — POC URINE PREG, ED: Preg Test, Ur: NEGATIVE

## 2019-11-21 MED ORDER — LIDOCAINE HCL (PF) 1 % IJ SOLN
5.0000 mL | Freq: Once | INTRAMUSCULAR | Status: DC
  Filled 2019-11-21: qty 5

## 2019-11-21 MED ORDER — CLINDAMYCIN HCL 300 MG PO CAPS: 300.0000 mg | ORAL_CAPSULE | Freq: Three times a day (TID) | ORAL | 0 refills | Status: AC

## 2019-11-21 MED ORDER — RABIES IMMUNE GLOBULIN 150 UNIT/ML IM INJ
20.0000 [IU]/kg | INJECTION | Freq: Once | INTRAMUSCULAR | Status: AC
  Administered 2019-11-22: 1425 [IU] via INTRAMUSCULAR
  Filled 2019-11-21: qty 9.5

## 2019-11-21 MED ORDER — CLINDAMYCIN PHOSPHATE 600 MG/50ML IV SOLN
600.0000 mg | Freq: Once | INTRAVENOUS | Status: AC
  Administered 2019-11-21: 600 mg via INTRAVENOUS
  Filled 2019-11-21: qty 50

## 2019-11-21 MED ORDER — DOXYCYCLINE MONOHYDRATE 100 MG PO TABS: 100.0000 mg | ORAL_TABLET | Freq: Two times a day (BID) | ORAL | 0 refills | Status: AC

## 2019-11-21 MED ORDER — OXYCODONE-ACETAMINOPHEN 5-325 MG PO TABS
1.0000 | ORAL_TABLET | ORAL | 0 refills | Status: AC | PRN
Start: 2019-11-21 — End: 2019-11-26

## 2019-11-21 MED ORDER — TETANUS-DIPHTH-ACELL PERTUSSIS 5-2.5-18.5 LF-MCG/0.5 IM SUSY
0.5000 mL | PREFILLED_SYRINGE | Freq: Once | INTRAMUSCULAR | Status: AC
  Administered 2019-11-21: 0.5 mL via INTRAMUSCULAR
  Filled 2019-11-21: qty 0.5

## 2019-11-21 MED ORDER — RABIES VACCINE, PCEC IM SUSR
1.0000 mL | Freq: Once | INTRAMUSCULAR | Status: AC
  Administered 2019-11-22: 1 mL via INTRAMUSCULAR
  Filled 2019-11-21: qty 1

## 2019-11-21 NOTE — Telephone Encounter (Signed)
error 

## 2019-11-21 NOTE — Discharge Instructions (Signed)
Please proceed to an urgent care or return to the emergency department for repeat doses of the rabies vaccine in 3 days, 7 days and 14 days.  Please call orthopedics first thing in the morning to arrange very close follow-up.

## 2019-11-21 NOTE — ED Triage Notes (Signed)
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 Unaware at this time of the dog's vaccine status.

## 2019-11-21 NOTE — ED Notes (Signed)
NO PROFILE SHEET AVAILABLE FOR [employer] in our system  Pt gave manager contact: DOUG SUMMERS [phone]  "He said just treat for dog bite, no drug screen required"   Vernona Rieger tech called and acquired this information

## 2019-11-22 MED ORDER — OXYCODONE-ACETAMINOPHEN 5-325 MG PO TABS
1.0000 | ORAL_TABLET | Freq: Once | ORAL | Status: AC
  Administered 2019-11-22: 1 via ORAL
  Filled 2019-11-22: qty 1

## 2019-11-22 NOTE — ED Notes (Signed)
E signature pad not working. Pt educated on discharge instructions and prescriptions and verbalized understanding.  

## 2019-11-22 NOTE — ED Provider Notes (Signed)
[facility] Emergency Department Provider Note  ____________________________________________   First MD Initiated Contact with Patient 11/21/19 1935     (approximate)  I have reviewed the triage vital signs and the nursing notes.   HISTORY  Chief Complaint Animal Bite  HPI Diamond Sutton is a 34 y.o. female who presents to the emergency department for evaluation of dog bite.  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  The dog initially bit her on the lateral aspect of her left thigh, through her jeans and as she was trying to get him off, it bit her again in the left hand on the left middle digit.  It is unknown what the vaccination status is of the dog, and they are unsure if this belong to a specific cause or if they will be able to find the dog for quarantine.  Her Tdap is also not been updated recently.  Her pain right now is a 10/10 and is located in the left thigh and left middle digit.         Past Medical History:  Diagnosis Date  . Anemia   . Depression 2001  . Ectopic pregnancy   . History of urinary tract infection   . Hypertension   . Ovarian cyst     Patient Active Problem List   Diagnosis Date Noted  . Smoker 09/19/2019  . History of bilateral tubal ligation 05/09/19 09/08/2019  . Heavy period 07/18/2019    Past Surgical History:  Procedure Laterality Date  . DILATION AND CURETTAGE OF UTERUS     SAB  . TUBAL LIGATION N/A 05/09/2019   Procedure: POST PARTUM TUBAL LIGATION;  Surgeon: Hildred Laser, MD;  Location: [facility] ORS;  Service: Gynecology;  Laterality: N/A;    Prior to Admission medications   Medication Sig Start Date End Date Taking? Authorizing Provider  clindamycin (CLEOCIN) 300 MG capsule Take 1 capsule (300 mg total) by mouth 3 (three) times daily for 10 days. 11/21/19 12/01/19  Lucy Chris, PA  doxycycline (ADOXA) 100 MG tablet Take 1  tablet (100 mg total) by mouth 2 (two) times daily for 10 days. 11/21/19 12/01/19  Lucy Chris, PA  glucose blood test strip Test blood sugar 4 times per day and PRN. 03/31/19   Doreene Burke, CNM  medroxyPROGESTERone (DEPO-PROVERA) 150 MG/ML injection Inject 1 mL (150 mg total) into the muscle once for 1 dose. 07/18/19 07/18/19  Doreene Burke, CNM  metroNIDAZOLE (METROGEL) 0.75 % vaginal gel INSERT 1 APPLICATORFUL VAGINALLY AT BEDTIME FOR 5 DAYS Patient not taking: Reported on 09/19/2019 08/28/19   Gunnar Bulla, CNM  NIFEdipine (ADALAT CC) 30 MG 24 hr tablet TAKE 1 TABLET(30 MG) BY MOUTH DAILY 08/16/19   Lawhorn, Vanessa Chuichu, CNM  oxyCODONE-acetaminophen (PERCOCET) 5-325 MG tablet Take 1 tablet by mouth every 4 (four) hours as needed for up to 5 days for severe pain. 11/21/19 11/26/19  Lucy Chris, PA  Prenatal Vit-Fe Fumarate-FA (PRENATAL VITAMIN) 27-0.8 MG TABS Take 1 tablet by mouth daily at 6 (six) AM. 10/03/18   Federico Flake, MD  traMADol (ULTRAM) 50 MG tablet Take 1 tablet (50 mg total) by mouth every 12 (twelve) hours as needed. 06/09/19   Gunnar Bulla, CNM    Allergies Penicillins  Family History  Problem Relation Age of Onset  . Diabetes Mother   . Hypertension Mother   .  Heart murmur Half-Brother   . Breast cancer Neg Hx   . Ovarian cancer Neg Hx   . Colon cancer Neg Hx   . Heart disease Neg Hx     Social History Social History   Tobacco Use  . Smoking status: Current Every Day Smoker    Packs/day: 0.25    Years: 15.00    Pack years: 3.75    Types: Cigarettes  . Smokeless tobacco: Never Used  Vaping Use  . Vaping Use: Never used  Substance Use Topics  . Alcohol use: Yes    Alcohol/week: 3.0 standard drinks    Types: 1 Cans of beer, 2 Shots of liquor per week    Comment: last ETOH 09/15/19  . Drug use: Not Currently    Review of Systems Constitutional: No fever/chills Eyes: No visual changes. ENT: No sore  throat. Cardiovascular: Denies chest pain. Respiratory: Denies shortness of breath. Gastrointestinal: No abdominal pain.  No nausea, no vomiting.  No diarrhea.  No constipation. Genitourinary: Negative for dysuria. Musculoskeletal: + Left thigh pain, + left third finger pain, negative for back pain. Skin: Negative for rash. Neurological: Negative for headaches, focal weakness or numbness.   ____________________________________________   PHYSICAL EXAM:  VITAL SIGNS: ED Triage Vitals  Enc Vitals Group     BP 11/21/19 1844 (!) 134/91     Pulse Rate 11/21/19 1844 73     Resp 11/21/19 1844 16     Temp 11/21/19 1844 98.8 F (37.1 C)     Temp Source 11/21/19 1844 Oral     SpO2 11/21/19 1844 100 %     Weight 11/21/19 1845 158 lb (71.7 kg)     Height 11/21/19 1845 5\' 6"  (1.676 m)     Head Circumference --      Peak Flow --      Pain Score 11/21/19 1844 10     Pain Loc --      Pain Edu? --      Excl. in GC? --     Constitutional: Alert and oriented. Well appearing and in no acute distress. Eyes: Conjunctivae are normal.  Head: Atraumatic. Nose: No congestion/rhinnorhea. Mouth/Throat: Mucous membranes are moist.   Neck: No stridor.   Cardiovascular: Normal rate, regular rhythm.  Good peripheral circulation. Respiratory: Normal respiratory effort.  No retractions. Musculoskeletal: There is swelling as well as 4-5 abrasions of the left lateral thigh.  No full punctures, abrasions have dried blood with no active bleeding.  Left middle digit has swelling about the PIP.  There is a jagged laceration on the palmar aspect of the left third PIP.  This appears to be through several layers of soft tissue.  There is significant decrease of range of motion of the left third digit PIP. Neurologic:  Normal speech and language. No gross focal neurologic deficits are appreciated. No gait instability. Skin:  Skin is warm, dry and intact except as described above. No rash noted. Psychiatric: Mood  and affect are normal. Speech and behavior are normal.  ____________________________________________   LABS (all labs ordered are listed, but only abnormal results are displayed)  Labs Reviewed  POC URINE PREG, ED   ____________________________________________  RADIOLOGY I, Lucy Chrisaitlin J Delores Edelstein, personally viewed and evaluated these images (plain radiographs) as part of my medical decision making, as well as reviewing the written report by the radiologist.  ED provider interpretation: No fracture of the left middle finger.  Official radiology report(s): DG Finger Middle Left  Result Date: 11/21/2019 CLINICAL DATA:  Dog bite. EXAM: LEFT MIDDLE FINGER 2+V COMPARISON:  None. FINDINGS: There is no evidence of fracture or dislocation. There is no evidence of arthropathy or other focal bone abnormality. Soft tissues are unremarkable. No radiopaque foreign body is noted. IMPRESSION: Negative. Electronically Signed   By: Lupita Raider M.D.   On: 11/21/2019 19:18    ____________________________________________   PROCEDURES  Procedure(s) performed (including Critical Care):  .Nerve Block  Date/Time: 11/22/2019 12:16 AM Performed by: Lucy Chris, PA Authorized by: Lucy Chris, PA   Consent:    Consent obtained:  Verbal   Consent given by:  Patient   Risks discussed:  Infection, pain and unsuccessful block   Alternatives discussed:  No treatment Indications:    Indications:  Pain relief Location:    Body area:  Upper extremity   Laterality:  Left Pre-procedure details:    Skin preparation:  Alcohol Procedure details (see MAR for exact dosages):    Block needle gauge:  30 G   Anesthetic injected:  Lidocaine 1% w/o epi   Injection procedure:  Anatomic landmarks identified, anatomic landmarks palpated and introduced needle Post-procedure details:    Patient tolerance of procedure:  Procedure terminated at patient's request Comments:     Nerve block of the left  middle digit was attempted in order to allow pain relief with irrigation of bite wound.  The patient had increased pain at the site of the nerve block and requested termination of the procedure, which was granted.  The wound was then still irrigated with out anesthesia.     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  The left thigh reveals minor abrasions with dried blood in place, no full punctures.  The left third digit wound appears deeper than dermal layer, though full extent of the wound is unable to be visualized.  X-rays are negative for any acute findings.  Discussed the case with Dr. Scotty Court who also recommended discussion with orthopedics.  Dr. Allena Katz was called and case was discussed with him.  Dr. Allena Katz recommended nerve block for thorough irrigation of the wound with close follow-up in his office tomorrow or the following day.  Nerve block was attempted, however the procedure was terminated at patient's request as she felt the nerve block was too painful.  The wound was still able to be thoroughly irrigated despite not achieving the nerve block and approximately 100 cc of fluid both Betadine and sterile saline were utilized to clean the wound.  It was then wrapped in Xeroform and a Band-Aid was placed around this.  The patient was also given IV antibiotics at the request of Dr. Allena Katz.  The patient was placed on a  combination of doxy and Clinda on an outpatient basis as she has a high penicillin allergy and is unsure if she has ever tolerated amoxicillin or Augmentin.  Her Tdap was also updated as she was unsure the last time of this.  She was given rabies immunoglobulin as  well as the first dose of the rabies vaccine, and was instructed to follow this vaccine with another dose on days 3, 7 and 14.  Was discussed with the patient that she is at really high risk for infection given the nature of this wound, there is particular concern that she may develop a infected tenosynovitis given the location of this wound.  Discussed the importance of taking all of her antibiotic as well as really close follow-up with Dr. Eliane Decree team.  The patient is understanding and is amenable with this plan.  She will return to the emergency department if she experiences any worsening.      ____________________________________________   FINAL CLINICAL IMPRESSION(S) / ED DIAGNOSES  Final diagnoses:  Dog bite, initial encounter  Dog bite of middle finger, initial encounter  Dog bite of left thigh, initial encounter     ED Discharge Orders         Ordered    clindamycin (CLEOCIN) 300 MG capsule  3 times daily        11/21/19 2303    doxycycline (ADOXA) 100 MG tablet  2 times daily        11/21/19 2303    oxyCODONE-acetaminophen (PERCOCET) 5-325 MG tablet  Every 4 hours PRN        11/21/19 2303          *Please note:  BRIEANN OSINSKI was evaluated in Emergency Department on 11/22/2019 for the symptoms described in the history of present illness. She was evaluated in the context of the global COVID-19 pandemic, which necessitated consideration that the patient might be at risk for infection with the SARS-CoV-2 virus that causes COVID-19. Institutional protocols and algorithms that pertain to the evaluation of patients at risk for COVID-19 are in a state of rapid change based on information released by regulatory bodies  including the CDC and federal and state organizations. These policies and algorithms were followed during the patient's care in the ED.  Some ED evaluations and interventions may be delayed as a result of limited staffing during and the pandemic.*   Note:  This document was prepared using Dragon voice recognition software and may include unintentional dictation errors.    Lucy Chris, PA 11/22/19 Waunita Schooner    Sharman Cheek, MD 11/24/19 [phone]

## 2019-11-24 ENCOUNTER — Emergency Department
Admission: EM | Admit: 2019-11-24 | Discharge: 2019-11-24 | Disposition: A | Discharge status: Home / Self Care | Payer: Worker's Compensation | Attending: Emergency Medicine | Admitting: Emergency Medicine

## 2019-11-24 ENCOUNTER — Ambulatory Visit: Payer: Self-pay | Admitting: *Deleted

## 2019-11-24 ENCOUNTER — Other Ambulatory Visit: Payer: Self-pay

## 2019-11-24 DIAGNOSIS — F17210 Nicotine dependence, cigarettes, uncomplicated: Secondary | ICD-10-CM | POA: Insufficient documentation

## 2019-11-24 DIAGNOSIS — Z79899 Other long term (current) drug therapy: Secondary | ICD-10-CM | POA: Diagnosis not present

## 2019-11-24 DIAGNOSIS — Z23 Encounter for immunization: Secondary | ICD-10-CM | POA: Insufficient documentation

## 2019-11-24 DIAGNOSIS — Z2914 Encounter for prophylactic rabies immune globin: Secondary | ICD-10-CM | POA: Diagnosis not present

## 2019-11-24 DIAGNOSIS — I10 Essential (primary) hypertension: Secondary | ICD-10-CM | POA: Insufficient documentation

## 2019-11-24 DIAGNOSIS — Z203 Contact with and (suspected) exposure to rabies: Secondary | ICD-10-CM | POA: Diagnosis not present

## 2019-11-24 MED ORDER — RABIES VACCINE, PCEC IM SUSR
1.0000 mL | Freq: Once | INTRAMUSCULAR | Status: AC
  Administered 2019-11-24: 1 mL via INTRAMUSCULAR
  Filled 2019-11-24: qty 1

## 2019-11-24 NOTE — Telephone Encounter (Signed)
Pt called stating she was bitten by a dog and received the first dose of her rabies vaccine; she would like to know where she should go for her remaining doses; spoke with Victorino Dike, RN at [facility] ED; she states the pt can come to [facility] or [facility] and no appt is needed; pt notified and verbalized understanding.  Reason for Disposition  General information question, no triage required and triager able to answer question  Answer Assessment - Initial Assessment Questions 1. REASON FOR CALL or QUESTION: "What is your reason for calling today?" or "How can I best help you?" or "What question do you have that I can help answer?"     Where do I get my rabies vaccine?  Protocols used: INFORMATION ONLY CALL - NO TRIAGE-A-AH

## 2019-11-24 NOTE — ED Provider Notes (Signed)
[facility] Emergency Department Provider Note  ____________________________________________   First MD Initiated Contact with Patient 11/24/19 1719     (approximate)  I have reviewed the triage vital signs and the nursing notes.   HISTORY  Chief Complaint Animal Bite (Prior animal bite , Need 2nd rabbies shot )  HPI Diamond Sutton is a 34 y.o. female who presents to the emergency department for repeat vaccination against rabies.  The patient was seen in our emergency department on 11/21/2019 and given the rabies immunoglobulin and first round of rabies vaccination.  This is day 3 following the injury and requires repeat vaccination.  The patient is still having some continued pain in her thigh and left finger.  The left finger was evaluated in orthopedics and is being followed by their team.  She has still not yet returned to work.         Past Medical History:  Diagnosis Date  . Anemia   . Depression 2001  . Ectopic pregnancy   . History of urinary tract infection   . Hypertension   . Ovarian cyst     Patient Active Problem List   Diagnosis Date Noted  . Smoker 09/19/2019  . History of bilateral tubal ligation 05/09/19 09/08/2019  . Heavy period 07/18/2019    Past Surgical History:  Procedure Laterality Date  . DILATION AND CURETTAGE OF UTERUS     SAB  . TUBAL LIGATION N/A 05/09/2019   Procedure: POST PARTUM TUBAL LIGATION;  Surgeon: Hildred Laser, MD;  Location: [facility] ORS;  Service: Gynecology;  Laterality: N/A;    Prior to Admission medications   Medication Sig Start Date End Date Taking? Authorizing Provider  clindamycin (CLEOCIN) 300 MG capsule Take 1 capsule (300 mg total) by mouth 3 (three) times daily for 10 days. 11/21/19 12/01/19  Lucy Chris, PA  doxycycline (ADOXA) 100 MG tablet Take 1 tablet (100 mg total) by mouth 2 (two) times daily for 10 days. 11/21/19 12/01/19  Lucy Chris, PA  glucose blood test strip Test  blood sugar 4 times per day and PRN. 03/31/19   Doreene Burke, CNM  medroxyPROGESTERone (DEPO-PROVERA) 150 MG/ML injection Inject 1 mL (150 mg total) into the muscle once for 1 dose. 07/18/19 07/18/19  Doreene Burke, CNM  metroNIDAZOLE (METROGEL) 0.75 % vaginal gel INSERT 1 APPLICATORFUL VAGINALLY AT BEDTIME FOR 5 DAYS Patient not taking: Reported on 09/19/2019 08/28/19   Gunnar Bulla, CNM  NIFEdipine (ADALAT CC) 30 MG 24 hr tablet TAKE 1 TABLET(30 MG) BY MOUTH DAILY 08/16/19   Lawhorn, Vanessa [place], CNM  oxyCODONE-acetaminophen (PERCOCET) 5-325 MG tablet Take 1 tablet by mouth every 4 (four) hours as needed for up to 5 days for severe pain. 11/21/19 11/26/19  Lucy Chris, PA  Prenatal Vit-Fe Fumarate-FA (PRENATAL VITAMIN) 27-0.8 MG TABS Take 1 tablet by mouth daily at 6 (six) AM. 10/03/18   Federico Flake, MD  traMADol (ULTRAM) 50 MG tablet Take 1 tablet (50 mg total) by mouth every 12 (twelve) hours as needed. 06/09/19   Gunnar Bulla, CNM    Allergies Penicillins  Family History  Problem Relation Age of Onset  . Diabetes Mother   . Hypertension Mother   . Heart murmur Half-Brother   . Breast cancer Neg Hx   . Ovarian cancer Neg Hx   . Colon cancer Neg Hx   . Heart disease Neg Hx     Social History Social History   Tobacco Use  .  Smoking status: Current Every Day Smoker    Packs/day: 0.25    Years: 15.00    Pack years: 3.75    Types: Cigarettes  . Smokeless tobacco: Never Used  Vaping Use  . Vaping Use: Never used  Substance Use Topics  . Alcohol use: Yes    Alcohol/week: 3.0 standard drinks    Types: 1 Cans of beer, 2 Shots of liquor per week    Comment: last ETOH 09/15/19  . Drug use: Not Currently    Review of Systems Constitutional: No fever/chills Eyes: No visual changes. ENT: No sore throat. Cardiovascular: Denies chest pain. Respiratory: Denies shortness of breath. Gastrointestinal: No abdominal pain.  No nausea, no  vomiting.  No diarrhea.  No constipation. Genitourinary: Negative for dysuria. Musculoskeletal: + Left thigh pain, left finger pain, negative for back pain. Skin: Negative for rash. Neurological: Negative for headaches, focal weakness or numbness.   ____________________________________________   PHYSICAL EXAM:  VITAL SIGNS: ED Triage Vitals  Enc Vitals Group     BP 11/24/19 1718 (!) 137/91     Pulse Rate 11/24/19 1718 78     Resp 11/24/19 1718 17     Temp 11/24/19 1718 98 F (36.7 C)     Temp Source 11/24/19 1718 Oral     SpO2 11/24/19 1718 100 %     Weight 11/24/19 1719 163 lb (73.9 kg)     Height 11/24/19 1719 5\' 6"  (1.676 m)     Head Circumference --      Peak Flow --      Pain Score 11/24/19 1719 7     Pain Loc --      Pain Edu? --      Excl. in GC? --     Constitutional: Alert and oriented. Well appearing and in no acute distress. Eyes: Conjunctivae are normal.  Head: Atraumatic. Nose: No congestion/rhinnorhea. Mouth/Throat: Mucous membranes are moist.  Oropharynx non-erythematous. Neck: No stridor.   Cardiovascular: Normal rate, regular rhythm.  Good peripheral circulation. Respiratory: Normal respiratory effort.  No retractions.  Neurologic:  Normal speech and language. No gross focal neurologic deficits are appreciated. No gait instability. Skin:  Skin is warm, dry and intact. No rash noted. Psychiatric: Mood and affect are normal. Speech and behavior are normal.   ____________________________________________   INITIAL IMPRESSION / ASSESSMENT AND PLAN / ED COURSE  As part of my medical decision making, I reviewed the following data within the electronic MEDICAL RECORD NUMBER Notes from prior ED visits        Patient is a 34 year old female who reports emergency department following dog bite injury 3 days ago.  Her injuries are now being followed by orthopedics.  She reports today just for her next dose of her rabies vaccine.  Rabies vaccination given.  She  was instructed to return in 4 additional days, 7 days from the initial injury for her next rabies vaccination dose.  Patient acknowledged and will return to the emergency department if she experiences any worsening in the interim.      ____________________________________________   FINAL CLINICAL IMPRESSION(S) / ED DIAGNOSES  Final diagnoses:  Need for rabies vaccination     ED Discharge Orders    None      *Please note:  Diamond Sutton was evaluated in Emergency Department on 11/24/2019 for the symptoms described in the history of present illness. She was evaluated in the context of the global COVID-19 pandemic, which necessitated consideration that the patient might be at  risk for infection with the SARS-CoV-2 virus that causes COVID-19. Institutional protocols and algorithms that pertain to the evaluation of patients at risk for COVID-19 are in a state of rapid change based on information released by regulatory bodies including the CDC and federal and state organizations. These policies and algorithms were followed during the patient's care in the ED.  Some ED evaluations and interventions may be delayed as a result of limited staffing during and the pandemic.*   Note:  This document was prepared using Dragon voice recognition software and may include unintentional dictation errors.    Lucy Chris, PA 11/24/19 2156    Phineas Semen, MD 11/25/19 0001

## 2019-11-27 ENCOUNTER — Telehealth: Payer: Self-pay

## 2019-11-27 NOTE — Telephone Encounter (Signed)
Transition Care Management Follow-up Telephone Call  Date of discharge and from where: 11/24/2019 [facility]  How have you been since you were released from the hospital? Finger hurting is still hurting, going to see Ortho on Wednesday  Any questions or concerns? Yes, arm swelling at shoulder with vaccine  Items Reviewed:  Did the pt receive and understand the discharge instructions provided? Yes   Medications obtained and verified? No   Other? No   Any new allergies since your discharge? Yes   Dietary orders reviewed? Yes  Do you have support at home? Yes   Functional Questionnaire: (I = Independent and D = Dependent) ADLs: I  Bathing/Dressing- I  Meal Prep- I  Eating- I  Maintaining continence- I  Transferring/Ambulation- I  Managing Meds- I  Follow up appointments reviewed:   PCP Hospital f/u appt confirmed? Yes  Scheduled to see Charlaine Dalton, FNP-C on 01/29/2020 @ 10:20am.  Specialist Hospital f/u appt confirmed? Yes  Scheduled to see Letitia Caul on 11/30/2019 @ 10:45am.  Are transportation arrangements needed? No   If their condition worsens, is the pt aware to call PCP or go to the Emergency Dept.? Yes  Was the patient provided with contact information for the PCP's office or ED? Yes  Was to pt encouraged to call back with questions or concerns? Yes

## 2019-11-28 ENCOUNTER — Other Ambulatory Visit: Payer: Self-pay

## 2019-11-28 ENCOUNTER — Emergency Department
Admission: EM | Admit: 2019-11-28 | Discharge: 2019-11-28 | Disposition: A | Discharge status: Home / Self Care | Payer: Worker's Compensation | Attending: Emergency Medicine | Admitting: Emergency Medicine

## 2019-11-28 DIAGNOSIS — F17210 Nicotine dependence, cigarettes, uncomplicated: Secondary | ICD-10-CM | POA: Diagnosis not present

## 2019-11-28 DIAGNOSIS — Z2914 Encounter for prophylactic rabies immune globin: Secondary | ICD-10-CM | POA: Diagnosis not present

## 2019-11-28 DIAGNOSIS — I10 Essential (primary) hypertension: Secondary | ICD-10-CM | POA: Insufficient documentation

## 2019-11-28 DIAGNOSIS — Z23 Encounter for immunization: Secondary | ICD-10-CM | POA: Insufficient documentation

## 2019-11-28 DIAGNOSIS — Z203 Contact with and (suspected) exposure to rabies: Secondary | ICD-10-CM | POA: Insufficient documentation

## 2019-11-28 MED ORDER — RABIES VACCINE, PCEC IM SUSR
1.0000 mL | Freq: Once | INTRAMUSCULAR | Status: AC
  Administered 2019-11-28: 1 mL via INTRAMUSCULAR
  Filled 2019-11-28: qty 1

## 2019-11-28 NOTE — ED Triage Notes (Signed)
Pt here for 3rd rabies shot. Pt denies any other complaints.

## 2019-11-28 NOTE — ED Notes (Signed)
Here for 3rd rabies shot. A&O, ambulatory.

## 2019-11-28 NOTE — Discharge Instructions (Addendum)
Please return in one week for your remaining vaccine dose.

## 2019-11-29 NOTE — ED Provider Notes (Signed)
[facility] Emergency Department Provider Note  ____________________________________________   First MD Initiated Contact with Patient 11/28/19 1603     (approximate)  I have reviewed the triage vital signs and the nursing notes.   HISTORY  Chief Complaint 3rd Rabies Shot  HPI Diamond Sutton is a 34 y.o. female who presents to the emergency department for need for third rabies vaccination. The patient was bit exactly 1 week ago by a dog and has had all of her scheduled rabies vaccinations up to this point including day of, 3 days out and is today 1 week from injury. Otherwise, the patient states she is overall doing well. Still having pain and issues with range of motion of her finger that is being followed by orthopedics. She denies any fevers chills or other systemic symptoms.         Past Medical History:  Diagnosis Date  . Anemia   . Depression 2001  . Ectopic pregnancy   . History of urinary tract infection   . Hypertension   . Ovarian cyst     Patient Active Problem List   Diagnosis Date Noted  . Smoker 09/19/2019  . History of bilateral tubal ligation 05/09/19 09/08/2019  . Heavy period 07/18/2019    Past Surgical History:  Procedure Laterality Date  . DILATION AND CURETTAGE OF UTERUS     SAB  . TUBAL LIGATION N/A 05/09/2019   Procedure: POST PARTUM TUBAL LIGATION;  Surgeon: Hildred Laser, MD;  Location: [facility] ORS;  Service: Gynecology;  Laterality: N/A;    Prior to Admission medications   Medication Sig Start Date End Date Taking? Authorizing Provider  clindamycin (CLEOCIN) 300 MG capsule Take 1 capsule (300 mg total) by mouth 3 (three) times daily for 10 days. 11/21/19 12/01/19  Lucy Chris, PA  doxycycline (ADOXA) 100 MG tablet Take 1 tablet (100 mg total) by mouth 2 (two) times daily for 10 days. 11/21/19 12/01/19  Lucy Chris, PA  glucose blood test strip Test blood sugar 4 times per day and PRN. 03/31/19   Doreene Burke, CNM  medroxyPROGESTERone (DEPO-PROVERA) 150 MG/ML injection Inject 1 mL (150 mg total) into the muscle once for 1 dose. 07/18/19 07/18/19  Doreene Burke, CNM  metroNIDAZOLE (METROGEL) 0.75 % vaginal gel INSERT 1 APPLICATORFUL VAGINALLY AT BEDTIME FOR 5 DAYS Patient not taking: Reported on 09/19/2019 08/28/19   Gunnar Bulla, CNM  NIFEdipine (ADALAT CC) 30 MG 24 hr tablet TAKE 1 TABLET(30 MG) BY MOUTH DAILY 08/16/19   Lawhorn, Vanessa [place], CNM  Prenatal Vit-Fe Fumarate-FA (PRENATAL VITAMIN) 27-0.8 MG TABS Take 1 tablet by mouth daily at 6 (six) AM. 10/03/18   Federico Flake, MD  traMADol (ULTRAM) 50 MG tablet Take 1 tablet (50 mg total) by mouth every 12 (twelve) hours as needed. 06/09/19   Gunnar Bulla, CNM    Allergies Penicillins  Family History  Problem Relation Age of Onset  . Diabetes Mother   . Hypertension Mother   . Heart murmur Half-Brother   . Breast cancer Neg Hx   . Ovarian cancer Neg Hx   . Colon cancer Neg Hx   . Heart disease Neg Hx     Social History Social History   Tobacco Use  . Smoking status: Current Every Day Smoker    Packs/day: 0.25    Years: 15.00    Pack years: 3.75    Types: Cigarettes  . Smokeless tobacco: Never Used  Vaping Use  .  Vaping Use: Never used  Substance Use Topics  . Alcohol use: Yes    Alcohol/week: 3.0 standard drinks    Types: 1 Cans of beer, 2 Shots of liquor per week    Comment: last ETOH 09/15/19  . Drug use: Not Currently    Review of Systems Constitutional: No fever/chills Eyes: No visual changes. ENT: No sore throat. Cardiovascular: Denies chest pain. Respiratory: Denies shortness of breath. Gastrointestinal: No abdominal pain.  No nausea, no vomiting.  No diarrhea.  No constipation. Genitourinary: Negative for dysuria. Musculoskeletal: Negative for back pain. Skin: Negative for rash. Neurological: Negative for headaches, focal weakness or  numbness.  ____________________________________________   PHYSICAL EXAM:  VITAL SIGNS: ED Triage Vitals  Enc Vitals Group     BP 11/28/19 1540 133/77     Pulse Rate 11/28/19 1540 79     Resp 11/28/19 1540 18     Temp 11/28/19 1540 98.7 F (37.1 C)     Temp src --      SpO2 11/28/19 1540 100 %     Weight 11/28/19 1539 163 lb (73.9 kg)     Height 11/28/19 1539 5\' 6"  (1.676 m)     Head Circumference --      Peak Flow --      Pain Score 11/28/19 1539 0     Pain Loc --      Pain Edu? --      Excl. in GC? --    Constitutional: Alert and oriented. Well appearing and in no acute distress. Eyes: Conjunctivae are normal. PERRL. EOMI. Head: Atraumatic. Nose: No congestion/rhinnorhea. Mouth/Throat: Mucous membranes are moist.   Neck: No stridor.   Cardiovascular: Normal rate, regular rhythm. Grossly normal heart sounds.  Good peripheral circulation. Respiratory: Normal respiratory effort.  No retractions. Lungs CTAB. Gastrointestinal: Soft and nontender. No distention. No abdominal bruits. No CVA tenderness. Neurologic:  Normal speech and language. No gross focal neurologic deficits are appreciated. No gait instability. Psychiatric: Mood and affect are normal. Speech and behavior are normal.    ____________________________________________   INITIAL IMPRESSION / ASSESSMENT AND PLAN / ED COURSE  As part of my medical decision making, I reviewed the following data within the electronic MEDICAL RECORD NUMBER Notes from prior ED visits        Patient is here because she is due for her next rabies vaccination following a Worker's Comp. injury that occurred 1 week ago. She has been seen multiple times as scheduled for rabies vaccine, and will return in 1 additional week for last of the series. She is being followed for her finger by orthopedics already and they are keeping a close eye on this. Follow-up as scheduled.      ____________________________________________   FINAL CLINICAL  IMPRESSION(S) / ED DIAGNOSES  Final diagnoses:  Need for rabies vaccination     ED Discharge Orders    None      *Please note:  [name] was evaluated in Emergency Department on 11/29/2019 for the symptoms described in the history of present illness. She was evaluated in the context of the global COVID-19 pandemic, which necessitated consideration that the patient might be at risk for infection with the SARS-CoV-2 virus that causes COVID-19. Institutional protocols and algorithms that pertain to the evaluation of patients at risk for COVID-19 are in a state of rapid change based on information released by regulatory bodies including the CDC and federal and state organizations. These policies and algorithms were followed during the patient's care  in the ED.  Some ED evaluations and interventions may be delayed as a result of limited staffing during and the pandemic.*   Note:  This document was prepared using Dragon voice recognition software and may include unintentional dictation errors.    Lucy Chris, PA 11/29/19 2239    Chesley Noon, MD 12/02/19 [phone]

## 2019-12-05 ENCOUNTER — Emergency Department
Admission: EM | Admit: 2019-12-05 | Discharge: 2019-12-05 | Disposition: A | Discharge status: Home / Self Care | Payer: Worker's Compensation | Attending: Emergency Medicine | Admitting: Emergency Medicine

## 2019-12-05 DIAGNOSIS — F17210 Nicotine dependence, cigarettes, uncomplicated: Secondary | ICD-10-CM | POA: Insufficient documentation

## 2019-12-05 DIAGNOSIS — Z23 Encounter for immunization: Secondary | ICD-10-CM | POA: Diagnosis not present

## 2019-12-05 DIAGNOSIS — I10 Essential (primary) hypertension: Secondary | ICD-10-CM | POA: Diagnosis not present

## 2019-12-05 DIAGNOSIS — Z203 Contact with and (suspected) exposure to rabies: Secondary | ICD-10-CM | POA: Diagnosis not present

## 2019-12-05 DIAGNOSIS — Z2914 Encounter for prophylactic rabies immune globin: Secondary | ICD-10-CM | POA: Diagnosis not present

## 2019-12-05 MED ORDER — RABIES VACCINE, PCEC IM SUSR
1.0000 mL | Freq: Once | INTRAMUSCULAR | Status: AC
  Administered 2019-12-05: 1 mL via INTRAMUSCULAR
  Filled 2019-12-05: qty 1

## 2019-12-05 NOTE — ED Provider Notes (Signed)
Emergency Department Provider Note  ____________________________________________  Time seen: Approximately 6:04 PM  I have reviewed the triage vital signs and the nursing notes.   HISTORY  Chief Complaint Rabies Injection   Historian Patient    HPI Diamond Sutton is a 34 y.o. female presents to the emergency department for final rabies vaccine.  Patient has no questions or concerns.   Past Medical History:  Diagnosis Date   Anemia    Depression 2001   Ectopic pregnancy    History of urinary tract infection    Hypertension    Ovarian cyst      Immunizations up to date:  Yes.     Past Medical History:  Diagnosis Date   Anemia    Depression 2001   Ectopic pregnancy    History of urinary tract infection    Hypertension    Ovarian cyst     Patient Active Problem List   Diagnosis Date Noted   Smoker 09/19/2019   History of bilateral tubal ligation 05/09/19 09/08/2019   Heavy period 07/18/2019    Past Surgical History:  Procedure Laterality Date   DILATION AND CURETTAGE OF UTERUS     SAB   TUBAL LIGATION N/A 05/09/2019   Procedure: POST PARTUM TUBAL LIGATION;  Surgeon: Hildred Laser, MD;  Location: [facility] ORS;  Service: Gynecology;  Laterality: N/A;    Prior to Admission medications   Medication Sig Start Date End Date Taking? Authorizing Provider  glucose blood test strip Test blood sugar 4 times per day and PRN. 03/31/19   Doreene Burke, CNM  medroxyPROGESTERone (DEPO-PROVERA) 150 MG/ML injection Inject 1 mL (150 mg total) into the muscle once for 1 dose. 07/18/19 07/18/19  Doreene Burke, CNM  metroNIDAZOLE (METROGEL) 0.75 % vaginal gel INSERT 1 APPLICATORFUL VAGINALLY AT BEDTIME FOR 5 DAYS Patient not taking: Reported on 09/19/2019 08/28/19   Gunnar Bulla, CNM  NIFEdipine (ADALAT CC) 30 MG 24 hr tablet TAKE 1 TABLET(30 MG) BY MOUTH DAILY 08/16/19   Lawhorn, Vanessa [place], CNM  Prenatal Vit-Fe Fumarate-FA (PRENATAL  VITAMIN) 27-0.8 MG TABS Take 1 tablet by mouth daily at 6 (six) AM. 10/03/18   Federico Flake, MD  traMADol (ULTRAM) 50 MG tablet Take 1 tablet (50 mg total) by mouth every 12 (twelve) hours as needed. 06/09/19   Lawhorn, Vanessa , CNM    Allergies Penicillins  Family History  Problem Relation Age of Onset   Diabetes Mother    Hypertension Mother    Heart murmur Half-Brother    Breast cancer Neg Hx    Ovarian cancer Neg Hx    Colon cancer Neg Hx    Heart disease Neg Hx     Social History Social History   Tobacco Use   Smoking status: Current Every Day Smoker    Packs/day: 0.25    Years: 15.00    Pack years: 3.75    Types: Cigarettes   Smokeless tobacco: Never Used  Vaping Use   Vaping Use: Never used  Substance Use Topics   Alcohol use: Yes    Alcohol/week: 3.0 standard drinks    Types: 1 Cans of beer, 2 Shots of liquor per week    Comment: last ETOH 09/15/19   Drug use: Not Currently     Review of Systems  Constitutional: No fever/chills Eyes:  No discharge ENT: No upper respiratory complaints. Respiratory: no cough. No SOB/ use of accessory muscles to breath Gastrointestinal:   No nausea, no vomiting.  No diarrhea.  No  constipation. Musculoskeletal: Negative for musculoskeletal pain. Skin: Negative for rash, abrasions, lacerations, ecchymosis.    ____________________________________________   PHYSICAL EXAM:  VITAL SIGNS: ED Triage Vitals  Enc Vitals Group     BP 12/05/19 1726 (!) 131/92     Pulse Rate 12/05/19 1726 77     Resp 12/05/19 1726 16     Temp 12/05/19 1726 98.3 F (36.8 C)     Temp Source 12/05/19 1726 Oral     SpO2 12/05/19 1726 99 %     Weight --      Height --      Head Circumference --      Peak Flow --      Pain Score 12/05/19 1727 0     Pain Loc --      Pain Edu? --      Excl. in GC? --      Constitutional: Alert and oriented. Well appearing and in no acute distress. Eyes: Conjunctivae are normal.  PERRL. EOMI. Head: Atraumatic. Cardiovascular: Normal rate, regular rhythm. Normal S1 and S2.  Good peripheral circulation. Respiratory: Normal respiratory effort without tachypnea or retractions. Lungs CTAB. Good air entry to the bases with no decreased or absent breath sounds Gastrointestinal: Bowel sounds x 4 quadrants. Soft and nontender to palpation. No guarding or rigidity. No distention. Musculoskeletal: Full range of motion to all extremities. No obvious deformities noted Neurologic:  Normal for age. No gross focal neurologic deficits are appreciated.  Skin:  Skin is warm, dry and intact. No rash noted. Psychiatric: Mood and affect are normal for age. Speech and behavior are normal.   ____________________________________________   LABS (all labs ordered are listed, but only abnormal results are displayed)  Labs Reviewed - No data to display ____________________________________________  EKG   ____________________________________________  RADIOLOGY   No results found.  ____________________________________________    PROCEDURES  Procedure(s) performed:     Procedures     Medications  rabies vaccine (RABAVERT) injection 1 mL (1 mL Intramuscular Given 12/05/19 1755)     ____________________________________________   INITIAL IMPRESSION / ASSESSMENT AND PLAN / ED COURSE  Pertinent labs & imaging results that were available during my care of the patient were reviewed by me and considered in my medical decision making (see chart for details).      Assessment and plan Encounter for rabies vaccination 34 year old female presents to the emergency department for final rabies vaccine.  Patient has no questions or concerns.   ____________________________________________  FINAL CLINICAL IMPRESSION(S) / ED DIAGNOSES  Final diagnoses:  Need for rabies vaccination      NEW MEDICATIONS STARTED DURING THIS VISIT:  ED Discharge Orders    None           This chart was dictated using voice recognition software/Dragon. Despite best efforts to proofread, errors can occur which can change the meaning. Any change was purely unintentional.     Orvil Feil, PA-C 12/05/19 1805    Minna Antis, MD 12/05/19 2148

## 2019-12-20 DIAGNOSIS — Z419 Encounter for procedure for purposes other than remedying health state, unspecified: Secondary | ICD-10-CM | POA: Diagnosis not present

## 2019-12-25 ENCOUNTER — Ambulatory Visit: Payer: Worker's Compensation | Attending: Orthopedic Surgery | Admitting: Occupational Therapy

## 2019-12-25 ENCOUNTER — Encounter: Payer: Self-pay | Admitting: Occupational Therapy

## 2019-12-25 ENCOUNTER — Other Ambulatory Visit: Payer: Self-pay

## 2019-12-25 DIAGNOSIS — M79642 Pain in left hand: Secondary | ICD-10-CM | POA: Insufficient documentation

## 2019-12-25 DIAGNOSIS — M6281 Muscle weakness (generalized): Secondary | ICD-10-CM | POA: Diagnosis present

## 2019-12-25 DIAGNOSIS — L905 Scar conditions and fibrosis of skin: Secondary | ICD-10-CM | POA: Diagnosis present

## 2019-12-25 DIAGNOSIS — M25642 Stiffness of left hand, not elsewhere classified: Secondary | ICD-10-CM | POA: Diagnosis present

## 2019-12-25 NOTE — Patient Instructions (Signed)
Contrast - 3 x day  Scar massage   3rd digit  3 x day  desensitization - several times during day- massage , soft and rougher textures And then silicon sleeve on 3rd one for day time and one for night time   Tendon glides -AROM - but composite fist to one or 2 digits out of palm to prevent triggering or locking  10 reps 3 x day

## 2019-12-25 NOTE — Therapy (Signed)
[facility] [address] [place], [address] Phone: [phone]   Fax:  [phone]  Occupational Therapy Treatment  Patient Details  Name: Diamond Sutton MRN: [id number] Date of Birth: 1985-08-23 Referring Provider (OT): Dr Rosita Kea   Encounter Date: 12/25/2019   OT End of Session - 12/25/19 1509    Visit Number 1    Number of Visits 8    Date for OT Re-Evaluation 01/22/20    OT Start Time 1302    OT Stop Time 1351    OT Time Calculation (min) 49 min    Activity Tolerance Patient tolerated treatment well    Behavior During Therapy [facility] for tasks assessed/performed           Past Medical History:  Diagnosis Date  . Anemia   . Depression 2001  . Ectopic pregnancy   . History of urinary tract infection   . Hypertension   . Ovarian cyst     Past Surgical History:  Procedure Laterality Date  . DILATION AND CURETTAGE OF UTERUS     SAB  . TUBAL LIGATION N/A 05/09/2019   Procedure: POST PARTUM TUBAL LIGATION;  Surgeon: Hildred Laser, MD;  Location: [facility] ORS;  Service: Gynecology;  Laterality: N/A;    There were no vitals filed for this visit.   Subjective Assessment - 12/25/19 1354    Subjective  The dog bit me my 3rd day working at [employer] - bit my finger and thigh- pain when trying to make fist , squeeze or grip anything - scars still tender    Pertinent History 34 y.o. female who presents today for evaluation of dog bite to the left middle finger, date of injury 11/21/2019. Patient works for [employer], drives a truck and delivers packages. She was bit by a dog and evaluated in the emergency department where she had puncture wounds to the left middle finger and left thigh. Patient has remained out of work due to inability to grasp and grip as well as pain. She has been taking anti-inflammatory medications. Overall pain and swelling has been improving. She has completed oral antibiotics. She denies any warmth  redness drainage or swelling but does have some catching and locking of the left middle finger with tenderness along the A1 pulley. She also has some tenderness along the puncture sites along the volar and dorsal aspect of the left middle finger with scar tissue present. Left thigh is doing well, asymptomatic - pt had shot 12/20/19 for trigger finger and refer to OT /hand therapy for scar tissue and stiffness    Patient Stated Goals I want the pain better so I can grip and pick up objects without pain  like at work at [employer] , and my kids    Currently in Pain? Yes    Pain Score 7     Pain Location Finger (Comment which one)    Pain Orientation Left    Pain Descriptors / Indicators Aching;Tightness;Shooting;Sore;Tingling    Pain Type Acute pain    Pain Onset More than a month ago    Pain Frequency Intermittent              [facility] OT Assessment - 12/25/19 0001      Assessment   Medical Diagnosis Dog bite to L 3rd and trigger finger     Referring Provider (OT) Dr Rosita Kea    Onset Date/Surgical Date 11/21/19    Hand Dominance Right    Next MD  Visit --   end of Dec     Home  Environment   Lives With Family      Prior Function   Vocation Full time employment    Leisure [employer] since beginning of Nov , has 8 kids , house work -youngest 8 months       Edema   Edema R 3rd PIP 6.2 cm , L PIP 6.2 cm       Strength   Right Hand Grip (lbs) 70    Right Hand Lateral Pinch 19 lbs    Right Hand 3 Point Pinch 18 lbs    Left Hand Grip (lbs) 35    Left Hand Lateral Pinch 16 lbs    Left Hand 3 Point Pinch 15 lbs      Left Hand AROM   L Index  MCP 0-90 80 Degrees    L Index PIP 0-100 100 Degrees    L Long  MCP 0-90 80 Degrees    L Long PIP 0-100 100 Degrees    L Ring  MCP 0-90 80 Degrees    L Ring PIP 0-100 100 Degrees    L Little  MCP 0-90 80 Degrees    L Little PIP 0-100 100 Degrees          contrast done prior to review of HEP - pt had decrease pain and stiffness afterwards with AROM      Pt to do at home   Contrast - 3 x day  Scar massage   3rd digit  3 x day Demo pt scar massage and hand out provided   desensitization - several times during day- massage , soft and rougher textures - pt very sensitive - can do short periods but several times during day  And then silicon sleeve on 3rd one for day time and one for night time   Tendon glides -AROM - but composite fist to one or 2 digits out of palm to prevent triggering or locking  10 reps 3 x day               OT Education - 12/25/19 1400    Education Details Findings of eval and HEP    Person(s) Educated Patient    Methods Explanation;Demonstration;Tactile cues;Verbal cues;Handout    Comprehension Verbal cues required;Returned demonstration;Verbalized understanding            OT Short Term Goals - 12/25/19 1513      OT SHORT TERM GOAL #1   Title Pt to be indepedent in HEP to decrease scar tissue and pain on PRWHE by more than 20 points    Baseline scar tissue tender - more than 7/10 - PRWHE pain score 32/50 - no knowledge of HEP    Time 2    Period Weeks    Status New    Target Date 01/08/20      OT SHORT TERM GOAL #2   Title Pt able to tolerate massage and different texture and pressure over scars at L 3rd PIP    Baseline pain 7/10 with massage, to soft and rougher textures - pain with motion , cannot dry or wash finger    Time 3    Period Weeks    Status New    Target Date 01/15/20             OT Long Term Goals - 12/25/19 1514      OT LONG TERM GOAL #1   Title Pt AROM in 3rd  digits be able to touch palm and gripping broom, knife with pain less than 2/10    Baseline pain 7/10 increase with fexion or grip    Time 2    Period Weeks    Status New    Target Date 01/08/20      OT LONG TERM GOAL #2   Title L hand grip increase with more than 15 lbs to squeeze washcloth and pick up packages wiht no increase symptoms    Baseline grip R 70 , L 35 and increase pain - pain on PRWHE  32/50 and f7/10 with flexion or touching    Time 4    Period Weeks    Target Date 01/22/20      OT LONG TERM GOAL #3   Title PRWHE function score improve wiht more than 25 points    Baseline at eval score for function on PRWHE 36/50    Time 4    Period Weeks    Status New    Target Date 01/22/20                 Plan - 12/25/19 1510    Clinical Impression Statement Pt present 4 1/2 wks out from dog bite to L 3rd digit PIP joint -with trigger finger - pt show increase scar tissue, increase pain and decrease AROM and strength - limiting her functional use of L hand use in ADL's and IADL's    OT Occupational Profile and History Problem Focused Assessment - Including review of records relating to presenting problem    Occupational performance deficits (Please refer to evaluation for details): ADL's;IADL's;Work;Play;Leisure;Social Participation    Body Structure / Function / Physical Skills Edema;Dexterity;Flexibility;ROM;UE functional use;Scar mobility;Strength;Pain;IADL    Rehab Potential Good    Clinical Decision Making Limited treatment options, no task modification necessary    Comorbidities Affecting Occupational Performance: None    Modification or Assistance to Complete Evaluation  No modification of tasks or assist necessary to complete eval    OT Frequency --   2-3 x wk for 2 wks , then 1-2 x wk for 2 wks   OT Duration 4 weeks    OT Treatment/Interventions Self-care/ADL training;Moist Heat;Paraffin;Fluidtherapy;Contrast Bath;Therapeutic exercise;Manual Therapy;Passive range of motion;Patient/family education;Scar mobilization    Plan assess progress with HEP and change as needed    OT Home Exercise Plan see pt instruction    Consulted and Agree with Plan of Care Patient           Patient will benefit from skilled therapeutic intervention in order to improve the following deficits and impairments:   Body Structure / Function / Physical Skills: Edema, Dexterity,  Flexibility, ROM, UE functional use, Scar mobility, Strength, Pain, IADL       Visit Diagnosis: Pain in left hand - Plan: Ot plan of care cert/re-cert  Scar condition and fibrosis of skin - Plan: Ot plan of care cert/re-cert  Stiffness of left hand, not elsewhere classified - Plan: Ot plan of care cert/re-cert  Muscle weakness (generalized) - Plan: Ot plan of care cert/re-cert    Problem List Patient Active Problem List   Diagnosis Date Noted  . Smoker 09/19/2019  . History of bilateral tubal ligation 05/09/19 09/08/2019  . Heavy period 07/18/2019    Oletta Cohn OTR/L,CLT 12/25/2019, 3:21 PM   [facility] [address], [place], [address] Phone: [phone]   Fax:  [phone]  Name: Diamond Sutton MRN: [id number] Date of  Birth: 07/26/1985

## 2019-12-27 ENCOUNTER — Ambulatory Visit: Payer: Worker's Compensation | Admitting: Occupational Therapy

## 2019-12-29 ENCOUNTER — Ambulatory Visit: Payer: Worker's Compensation | Admitting: Occupational Therapy

## 2019-12-29 ENCOUNTER — Other Ambulatory Visit: Payer: Self-pay

## 2019-12-29 DIAGNOSIS — M25642 Stiffness of left hand, not elsewhere classified: Secondary | ICD-10-CM

## 2019-12-29 DIAGNOSIS — M6281 Muscle weakness (generalized): Secondary | ICD-10-CM

## 2019-12-29 DIAGNOSIS — M79642 Pain in left hand: Secondary | ICD-10-CM | POA: Diagnosis not present

## 2019-12-29 DIAGNOSIS — L905 Scar conditions and fibrosis of skin: Secondary | ICD-10-CM

## 2019-12-29 NOTE — Therapy (Signed)
[facility] [address] [place], [address] Phone: [phone]   Fax:  [phone]  Occupational Therapy Treatment  Patient Details  Name: JARIA CONWAY MRN: [id number] Date of Birth: 08-20-1985 Referring Provider (OT): Dr Rosita Kea   Encounter Date: 12/29/2019   OT End of Session - 12/29/19 1053    Visit Number 2    Number of Visits 8    Date for OT Re-Evaluation 01/22/20    OT Start Time 1045    OT Stop Time 1115    OT Time Calculation (min) 30 min    Activity Tolerance Patient tolerated treatment well    Behavior During Therapy [facility] for tasks assessed/performed           Past Medical History:  Diagnosis Date  . Anemia   . Depression 2001  . Ectopic pregnancy   . History of urinary tract infection   . Hypertension   . Ovarian cyst     Past Surgical History:  Procedure Laterality Date  . DILATION AND CURETTAGE OF UTERUS     SAB  . TUBAL LIGATION N/A 05/09/2019   Procedure: POST PARTUM TUBAL LIGATION;  Surgeon: Hildred Laser, MD;  Location: [facility] ORS;  Service: Gynecology;  Laterality: N/A;    There were no vitals filed for this visit.   Subjective Assessment - 12/29/19 1049    Subjective  Ache today with the cold weather - but tried to do my exercise 2 x - hard with the kids    Pertinent History 34 y.o. female who presents today for evaluation of dog bite to the left middle finger, date of injury 11/21/2019. Patient works for [employer], drives a truck and delivers packages. She was bit by a dog and evaluated in the emergency department where she had puncture wounds to the left middle finger and left thigh. Patient has remained out of work due to inability to grasp and grip as well as pain. She has been taking anti-inflammatory medications. Overall pain and swelling has been improving. She has completed oral antibiotics. She denies any warmth redness drainage or swelling but does have some catching and  locking of the left middle finger with tenderness along the A1 pulley. She also has some tenderness along the puncture sites along the volar and dorsal aspect of the left middle finger with scar tissue present. Left thigh is doing well, asymptomatic - pt had shot 12/20/19 for trigger finger and refer to OT /hand therapy for scar tissue and stiffness    Patient Stated Goals I want the pain better so I can grip and pick up objects without pain  like at work at [employer] , and my kids    Currently in Pain? Yes    Pain Score 8     Pain Location Finger (Comment which one)    Pain Orientation Left    Pain Descriptors / Indicators Tender    Pain Type Acute pain    Aggravating Factors  scar massage              [facility] OT Assessment - 12/29/19 0001      Left Hand AROM   L Index  MCP 0-90 90 Degrees    L Index PIP 0-100 95 Degrees    L Long  MCP 0-90 90 Degrees    L Long PIP 0-100 95 Degrees    L Ring  MCP 0-90 90 Degrees    L Ring PIP 0-100 100 Degrees  L Little  MCP 0-90 90 Degrees    L Little PIP 0-100 100 Degrees           AROM over MC's increase to WNL - pt arrive with pain reported with colder weather - 6-8/10 attempting scar massage on scars and rougher textures          OT Treatments/Exercises (OP) - 12/29/19 0001      LUE Paraffin   Number Minutes Paraffin 8 Minutes    LUE Paraffin Location Hand    Comments prior to scar massage- decrease pain           Pt to do at home moist heat or  Contrast - 3 x day  Scar massage review again with pt - she report could not do to herself - to ask oldest son to do volar and dorsal scar on   3rd digit  3 x day Demo pt scar massage and hand out provided -   desensitization - several times during day- massage , soft and rougher textures - pt very sensitive - can do short periods but several times during day  -REINFORCE And then silicon sleeve on 3rd one for day time and one for night time  - new ones provided  Fabricated MC block  splint for 3rd to wear night time and composite fist activities that can cause triggering   Tendon glides -AROM - but composite fist to one or 2 digits out of palm to prevent triggering or locking  10 reps 3 x day         OT Education - 12/29/19 1052    Education Details progress and what to focus on scar massage    Person(s) Educated Patient    Methods Explanation;Demonstration;Tactile cues;Verbal cues;Handout    Comprehension Verbal cues required;Returned demonstration;Verbalized understanding            OT Short Term Goals - 12/25/19 1513      OT SHORT TERM GOAL #1   Title Pt to be indepedent in HEP to decrease scar tissue and pain on PRWHE by more than 20 points    Baseline scar tissue tender - more than 7/10 - PRWHE pain score 32/50 - no knowledge of HEP    Time 2    Period Weeks    Status New    Target Date 01/08/20      OT SHORT TERM GOAL #2   Title Pt able to tolerate massage and different texture and pressure over scars at L 3rd PIP    Baseline pain 7/10 with massage, to soft and rougher textures - pain with motion , cannot dry or wash finger    Time 3    Period Weeks    Status New    Target Date 01/15/20             OT Long Term Goals - 12/25/19 1514      OT LONG TERM GOAL #1   Title Pt AROM in 3rd digits be able to touch palm and gripping broom, knife with pain less than 2/10    Baseline pain 7/10 increase with fexion or grip    Time 2    Period Weeks    Status New    Target Date 01/08/20      OT LONG TERM GOAL #2   Title L hand grip increase with more than 15 lbs to squeeze washcloth and pick up packages wiht no increase symptoms    Baseline grip R 70 ,  L 35 and increase pain - pain on PRWHE 32/50 and f7/10 with flexion or touching    Time 4    Period Weeks    Target Date 01/22/20      OT LONG TERM GOAL #3   Title PRWHE function score improve wiht more than 25 points    Baseline at eval score for function on PRWHE 36/50    Time 4    Period  Weeks    Status New    Target Date 01/22/20                 Plan - 12/29/19 1054    Clinical Impression Statement Pt is 5 wks out from dog bite to L 3rd digit PIP joint - pt cancelled Wed appt and rescheduled this appt but only had 30 min slot - pt show increase flexion of 3rd digit - scar still thick and tender , hyper sensitive volar worse than dorsal - reinforce for pt to do scar massage and mobs , desentitization several times during day -and if she cannot do it - ask somebody at home to help with with scar massage part- she do report digit locked this am getting up. Fabricate MC block splint to wear only with sleeping , and when doing activity that is composite flexion - to avoid triggering    OT Occupational Profile and History Problem Focused Assessment - Including review of records relating to presenting problem    Occupational performance deficits (Please refer to evaluation for details): ADL's;IADL's;Work;Play;Leisure;Social Participation    Body Structure / Function / Physical Skills Edema;Dexterity;Flexibility;ROM;UE functional use;Scar mobility;Strength;Pain;IADL    Rehab Potential Good    Clinical Decision Making Limited treatment options, no task modification necessary    Comorbidities Affecting Occupational Performance: None    Modification or Assistance to Complete Evaluation  No modification of tasks or assist necessary to complete eval    OT Frequency --   2-3 x wk for 2 wks and then 1-2 x wk   OT Duration 4 weeks    OT Treatment/Interventions Self-care/ADL training;Moist Heat;Paraffin;Fluidtherapy;Contrast Bath;Therapeutic exercise;Manual Therapy;Passive range of motion;Patient/family education;Scar mobilization    Plan assess progress with HEP and change as needed    OT Home Exercise Plan see pt instruction    Consulted and Agree with Plan of Care Patient           Patient will benefit from skilled therapeutic intervention in order to improve the following  deficits and impairments:   Body Structure / Function / Physical Skills: Edema,Dexterity,Flexibility,ROM,UE functional use,Scar mobility,Strength,Pain,IADL       Visit Diagnosis: Scar condition and fibrosis of skin  Stiffness of left hand, not elsewhere classified  Muscle weakness (generalized)    Problem List Patient Active Problem List   Diagnosis Date Noted  . Smoker 09/19/2019  . History of bilateral tubal ligation 05/09/19 09/08/2019  . Heavy period 07/18/2019    Oletta Cohn OTR/L,CLT 12/29/2019, 1:20 PM  [facility] PH[facility] SPORTS MEDICINE [address], [place], [address] Phone: [phone]   Fax:  [phone]  Name: LARENE ASCENCIO MRN: [id number] Date of Birth: [birth year]

## 2020-01-01 ENCOUNTER — Ambulatory Visit: Payer: Worker's Compensation | Admitting: Occupational Therapy

## 2020-01-01 ENCOUNTER — Other Ambulatory Visit: Payer: Self-pay

## 2020-01-01 DIAGNOSIS — M25642 Stiffness of left hand, not elsewhere classified: Secondary | ICD-10-CM

## 2020-01-01 DIAGNOSIS — M6281 Muscle weakness (generalized): Secondary | ICD-10-CM

## 2020-01-01 DIAGNOSIS — L905 Scar conditions and fibrosis of skin: Secondary | ICD-10-CM

## 2020-01-01 DIAGNOSIS — M79642 Pain in left hand: Secondary | ICD-10-CM

## 2020-01-01 NOTE — Therapy (Signed)
[place] [facility] [address] [place], [address] Phone: [phone]   Fax:  [phone]  Occupational Therapy Treatment  Patient Details  Name: Diamond Sutton MRN: [id number] Date of Birth: February 22, 1985 Referring Provider (OT): Dr Rosita Kea   Encounter Date: 01/01/2020   OT End of Session - 01/01/20 1423    Visit Number 3    Number of Visits 8    Date for OT Re-Evaluation 01/22/20    OT Start Time 1405    OT Stop Time 1440    OT Time Calculation (min) 35 min    Activity Tolerance Patient tolerated treatment well    Behavior During Therapy [facility] for tasks assessed/performed           Past Medical History:  Diagnosis Date  . Anemia   . Depression 2001  . Ectopic pregnancy   . History of urinary tract infection   . Hypertension   . Ovarian cyst     Past Surgical History:  Procedure Laterality Date  . DILATION AND CURETTAGE OF UTERUS     SAB  . TUBAL LIGATION N/A 05/09/2019   Procedure: POST PARTUM TUBAL LIGATION;  Surgeon: Hildred Laser, MD;  Location: [facility] ORS;  Service: Gynecology;  Laterality: N/A;    There were no vitals filed for this visit.   Subjective Assessment - 01/01/20 1422    Subjective  It is little better - the pain coming in - the scar on the top of my finger now more tender than the one underneath- I did the massage  myself    Pertinent History 34 y.o. female who presents today for evaluation of dog bite to the left middle finger, date of injury 11/21/2019. Patient works for [employer], drives a truck and delivers packages. She was bit by a dog and evaluated in the emergency department where she had puncture wounds to the left middle finger and left thigh. Patient has remained out of work due to inability to grasp and grip as well as pain. She has been taking anti-inflammatory medications. Overall pain and swelling has been improving. She has completed oral antibiotics. She denies any warmth  redness drainage or swelling but does have some catching and locking of the left middle finger with tenderness along the A1 pulley. She also has some tenderness along the puncture sites along the volar and dorsal aspect of the left middle finger with scar tissue present. Left thigh is doing well, asymptomatic - pt had shot 12/20/19 for trigger finger and refer to OT /hand therapy for scar tissue and stiffness    Patient Stated Goals I want the pain better so I can grip and pick up objects without pain  like at work at [employer] , and my kids    Currently in Pain? Yes    Pain Score 4     Pain Location Finger (Comment which one)    Pain Orientation Left    Pain Descriptors / Indicators Aching;Tender    Pain Type Acute pain              [facility] OT Assessment - 01/01/20 0001      Left Hand AROM   L Long  MCP 0-90 90 Degrees    L Long PIP 0-100 95 Degrees           Pt arrive with pain reported less - now 4/10   but do feel some shooting pain at night time And scar on dorsal PIP  more tender than dorsal one          OT Treatments/Exercises (OP) - 01/01/20 0001      Ultrasound   Ultrasound Location volar 5th A1pulley 3rd    Ultrasound Parameters 3.40mhz, 20% , 0.8 intensity 3 min    Ultrasound Goals Pain      LUE Paraffin   Number Minutes Paraffin 8 Minutes    LUE Paraffin Location Hand    Comments prior to scar massage            Pt to do at homemoist heat or Contrast - 3 x day  Scar massage done on dorsal hand - pt to do on sides - this OT done scar mobs all directions - appear not adhesions - but more tender than other - pt to do scar mobs against scar - stay until Wed off the scar - except for desentitization Scar massage review again with pt - 3rd digit  PIP volar scar - extend digit - and make sure scar is mobilize 3 x day Demo pt scar massage and hand out provided-  desensitization - several times during day- massage , soft and rougher textures - pt very sensitive  - can do short periods but several times during day -REINFORCE Pt could tolerate soft texture on volar scar this date without pain -but rougher still issues  able to do mini massage some on volar scar  And then silicon sleeve on 3rd one for day time and one for night time  - new ones provided  Fabricated last time [facility] block splint for 3rd to wear night time and composite fist activities that can cause triggering  Pt to bring splint on next visit - to apply velcro  Cont tendon glides - pain free -3 x day after contrast or moist heat         OT Education - 01/01/20 1423    Education Details progress and what to focus on scar massage    Person(s) Educated Patient    Methods Explanation;Demonstration;Tactile cues;Verbal cues;Handout    Comprehension Verbal cues required;Returned demonstration;Verbalized understanding            OT Short Term Goals - 12/25/19 1513      OT SHORT TERM GOAL #1   Title Pt to be indepedent in HEP to decrease scar tissue and pain on PRWHE by more than 20 points    Baseline scar tissue tender - more than 7/10 - PRWHE pain score 32/50 - no knowledge of HEP    Time 2    Period Weeks    Status New    Target Date 01/08/20      OT SHORT TERM GOAL #2   Title Pt able to tolerate massage and different texture and pressure over scars at L 3rd PIP    Baseline pain 7/10 with massage, to soft and rougher textures - pain with motion , cannot dry or wash finger    Time 3    Period Weeks    Status New    Target Date 01/15/20             OT Long Term Goals - 12/25/19 1514      OT LONG TERM GOAL #1   Title Pt AROM in 3rd digits be able to touch palm and gripping broom, knife with pain less than 2/10    Baseline pain 7/10 increase with fexion or grip    Time 2    Period Weeks  Status New    Target Date 01/08/20      OT LONG TERM GOAL #2   Title L hand grip increase with more than 15 lbs to squeeze washcloth and pick up packages wiht no increase  symptoms    Baseline grip R 70 , L 35 and increase pain - pain on PRWHE 32/50 and f7/10 with flexion or touching    Time 4    Period Weeks    Target Date 01/22/20      OT LONG TERM GOAL #3   Title PRWHE function score improve wiht more than 25 points    Baseline at eval score for function on PRWHE 36/50    Time 4    Period Weeks    Status New    Target Date 01/22/20                 Plan - 01/01/20 1423    Clinical Impression Statement Pt is 5 1/2 wks out from dog bite to L 3rd digit PIP joint - pt continue to show increase flexion to 95 degrees at PIP , this date able to tolerate soft textures and light rougher texture , massage on volar scar - but towel still tender - able to initiate this date mini massager on volar scar but not dorsal - pt to cont with scar mobs , dezentitization and ROM    OT Occupational Profile and History Problem Focused Assessment - Including review of records relating to presenting problem    Occupational performance deficits (Please refer to evaluation for details): ADL's;IADL's;Work;Play;Leisure;Social Participation    Body Structure / Function / Physical Skills Edema;Dexterity;Flexibility;ROM;UE functional use;Scar mobility;Strength;Pain;IADL    Rehab Potential Good    Clinical Decision Making Limited treatment options, no task modification necessary    Comorbidities Affecting Occupational Performance: None    Modification or Assistance to Complete Evaluation  No modification of tasks or assist necessary to complete eval    OT Frequency 2x / week    OT Duration 4 weeks    OT Treatment/Interventions Self-care/ADL training;Moist Heat;Paraffin;Fluidtherapy;Contrast Bath;Therapeutic exercise;Manual Therapy;Passive range of motion;Patient/family education;Scar mobilization    Plan assess progress with HEP and change as needed    OT Home Exercise Plan see pt instruction    Consulted and Agree with Plan of Care Patient           Patient will benefit  from skilled therapeutic intervention in order to improve the following deficits and impairments:   Body Structure / Function / Physical Skills: Edema,Dexterity,Flexibility,ROM,UE functional use,Scar mobility,Strength,Pain,IADL       Visit Diagnosis: Scar condition and fibrosis of skin  Stiffness of left hand, not elsewhere classified  Muscle weakness (generalized)  Pain in left hand    Problem List Patient Active Problem List   Diagnosis Date Noted  . Smoker 09/19/2019  . History of bilateral tubal ligation 05/09/19 09/08/2019  . Heavy period 07/18/2019    Oletta Cohn OTR/L,CLT 01/01/2020, 2:54 PM  [place] [facility] [address], [place], [address] Phone: [phone]   Fax:  [phone]  Name: GLENIS MUSOLF MRN: [id number] Date of Birth: 18-Nov-1985

## 2020-01-03 ENCOUNTER — Ambulatory Visit: Payer: Worker's Compensation | Admitting: Occupational Therapy

## 2020-01-05 ENCOUNTER — Ambulatory Visit: Payer: Worker's Compensation | Admitting: Occupational Therapy

## 2020-01-05 ENCOUNTER — Other Ambulatory Visit: Payer: Self-pay

## 2020-01-05 DIAGNOSIS — L905 Scar conditions and fibrosis of skin: Secondary | ICD-10-CM

## 2020-01-05 DIAGNOSIS — M25642 Stiffness of left hand, not elsewhere classified: Secondary | ICD-10-CM

## 2020-01-05 DIAGNOSIS — M79642 Pain in left hand: Secondary | ICD-10-CM | POA: Diagnosis not present

## 2020-01-05 DIAGNOSIS — M6281 Muscle weakness (generalized): Secondary | ICD-10-CM

## 2020-01-05 NOTE — Therapy (Signed)
[place] [facility] [address] [place], [address] Phone: [phone]   Fax:  [phone]  Occupational Therapy Treatment  Patient Details  Name: Diamond Sutton MRN: [id number] Date of Birth: [birth year] Referring Provider (OT): Dr Rosita Kea   Encounter Date: 01/05/2020   OT End of Session - 01/05/20 1300    Visit Number 4    Number of Visits 8    Date for OT Re-Evaluation 01/22/20    OT Start Time 1148    OT Stop Time 1240    OT Time Calculation (min) 52 min    Activity Tolerance Patient tolerated treatment well    Behavior During Therapy [facility] for tasks assessed/performed           Past Medical History:  Diagnosis Date  . Anemia   . Depression 2001  . Ectopic pregnancy   . History of urinary tract infection   . Hypertension   . Ovarian cyst     Past Surgical History:  Procedure Laterality Date  . DILATION AND CURETTAGE OF UTERUS     SAB  . TUBAL LIGATION N/A 05/09/2019   Procedure: POST PARTUM TUBAL LIGATION;  Surgeon: Hildred Laser, MD;  Location: [facility] ORS;  Service: Gynecology;  Laterality: N/A;    There were no vitals filed for this visit.   Subjective Assessment - 01/05/20 1258    Subjective  Doing okay - I lost my splint for my finger - just tired - no sleeping with my 70 months old    Pertinent History 34 y.o. female who presents today for evaluation of dog bite to the left middle finger, date of injury 11/21/2019. Patient works for [employer], drives a truck and delivers packages. She was bit by a dog and evaluated in the emergency department where she had puncture wounds to the left middle finger and left thigh. Patient has remained out of work due to inability to grasp and grip as well as pain. She has been taking anti-inflammatory medications. Overall pain and swelling has been improving. She has completed oral antibiotics. She denies any warmth redness drainage or swelling but does have some catching  and locking of the left middle finger with tenderness along the A1 pulley. She also has some tenderness along the puncture sites along the volar and dorsal aspect of the left middle finger with scar tissue present. Left thigh is doing well, asymptomatic - pt had shot 12/20/19 for trigger finger and refer to OT /hand therapy for scar tissue and stiffness    Patient Stated Goals I want the pain better so I can grip and pick up objects without pain  like at work at [employer] , and my kids    Currently in Pain? Yes    Pain Score 5     Pain Location Finger (Comment which one)    Pain Orientation Left    Pain Descriptors / Indicators Tightness;Tender    Pain Type Acute pain    Pain Onset More than a month ago    Pain Frequency Intermittent    Aggravating Factors  scar massage , sensitive              [facility] OT Assessment - 01/05/20 0001      Strength   Right Hand Grip (lbs) 70    Right Hand Lateral Pinch 19 lbs    Right Hand 3 Point Pinch 18 lbs    Left Hand Grip (lbs) 43    Left Hand  Lateral Pinch 16 lbs    Left Hand 3 Point Pinch 15 lbs           Pt show increase grip strength - some pain with end grip  - prehension about same but WNL compare to L hand  AROM 3rd digit MC 90 , PIP 95 - pain with intrinsic fist at 95         OT Treatments/Exercises (OP) - 01/05/20 0001      LUE Paraffin   Number Minutes Paraffin 8 Minutes    LUE Paraffin Location Hand    Comments prior to scar mobs           Pt to do at homemoist heat or Contrast - 3 x day  Scar massage done on dorsal hand -  scar mobs all directions - appear not adhesions - but more tender than volar scar  - pt to do scar mobs against scar but keep PIP in flexion  Ands cont to address desensitization - textures, massage and tapping   Scar massagereview again with pt -3rd digit  PIP volar scar - extend digit - and make sure scar is mobilize 3 x day Demo pt scar massage - no as hyper sensitive - can tolerate towel and  tapping   desensitization - several times during day- massage , soft and rougher textures - pt very sensitive - can do short periods but several times during day-REINFORCE  able to do mini massage some on volar scar this date and sides of dorsal scar  And then silicon sleeve on 3rd one for day time and one for night time- new ones provided  Fabricated replacement  MC block splint for 3rd to wear night time and composite fist activities that can cause triggering Pt lost her previous one  Cont tendon glides - pain free -3 x day after contrast or moist heat          OT Education - 01/05/20 1300    Education Details progress and what to focus on scar massage    Person(s) Educated Patient    Methods Explanation;Demonstration;Tactile cues;Verbal cues;Handout    Comprehension Verbal cues required;Returned demonstration;Verbalized understanding            OT Short Term Goals - 12/25/19 1513      OT SHORT TERM GOAL #1   Title Pt to be indepedent in HEP to decrease scar tissue and pain on PRWHE by more than 20 points    Baseline scar tissue tender - more than 7/10 - PRWHE pain score 32/50 - no knowledge of HEP    Time 2    Period Weeks    Status New    Target Date 01/08/20      OT SHORT TERM GOAL #2   Title Pt able to tolerate massage and different texture and pressure over scars at L 3rd PIP    Baseline pain 7/10 with massage, to soft and rougher textures - pain with motion , cannot dry or wash finger    Time 3    Period Weeks    Status New    Target Date 01/15/20             OT Long Term Goals - 12/25/19 1514      OT LONG TERM GOAL #1   Title Pt AROM in 3rd digits be able to touch palm and gripping broom, knife with pain less than 2/10    Baseline pain 7/10 increase with fexion or grip  Time 2    Period Weeks    Status New    Target Date 01/08/20      OT LONG TERM GOAL #2   Title L hand grip increase with more than 15 lbs to squeeze washcloth and pick up  packages wiht no increase symptoms    Baseline grip R 70 , L 35 and increase pain - pain on PRWHE 32/50 and f7/10 with flexion or touching    Time 4    Period Weeks    Target Date 01/22/20      OT LONG TERM GOAL #3   Title PRWHE function score improve wiht more than 25 points    Baseline at eval score for function on PRWHE 36/50    Time 4    Period Weeks    Status New    Target Date 01/22/20                 Plan - 01/05/20 1300    Clinical Impression Statement Pt is 6 wks out from dog bite to L 3rd digit PIP joint - pt show increase AROM and grip strength - making progress in scar tissue on dorsal PIP but hyper sensitive - but volar scar more adhere and thick but less sensitive - pt to cont with scar mobs, AROM , desentitization    OT Occupational Profile and History Problem Focused Assessment - Including review of records relating to presenting problem    Occupational performance deficits (Please refer to evaluation for details): ADL's;IADL's;Work;Play;Leisure;Social Participation    Body Structure / Function / Physical Skills Edema;Dexterity;Flexibility;ROM;UE functional use;Scar mobility;Strength;Pain;IADL    Rehab Potential Good    Clinical Decision Making Limited treatment options, no task modification necessary    Comorbidities Affecting Occupational Performance: None    Modification or Assistance to Complete Evaluation  No modification of tasks or assist necessary to complete eval    OT Frequency 2x / week    OT Duration 4 weeks    OT Treatment/Interventions Self-care/ADL training;Moist Heat;Paraffin;Fluidtherapy;Contrast Bath;Therapeutic exercise;Manual Therapy;Passive range of motion;Patient/family education;Scar mobilization    Plan assess progress with HEP and change as needed    OT Home Exercise Plan see pt instruction    Consulted and Agree with Plan of Care Patient           Patient will benefit from skilled therapeutic intervention in order to improve the  following deficits and impairments:   Body Structure / Function / Physical Skills: Edema,Dexterity,Flexibility,ROM,UE functional use,Scar mobility,Strength,Pain,IADL       Visit Diagnosis: Scar condition and fibrosis of skin  Stiffness of left hand, not elsewhere classified  Muscle weakness (generalized)  Pain in left hand    Problem List Patient Active Problem List   Diagnosis Date Noted  . Smoker 09/19/2019  . History of bilateral tubal ligation 05/09/19 09/08/2019  . Heavy period 07/18/2019    Oletta Cohn OTR/L,CLT 01/05/2020, 1:04 PM  [place] [facility] [address] [place], [address] Phone: [phone]   Fax:  [phone]  Name: Diamond Sutton MRN: [id number] Date of Birth: [birth year]

## 2020-01-08 ENCOUNTER — Other Ambulatory Visit: Payer: Self-pay

## 2020-01-08 ENCOUNTER — Ambulatory Visit: Payer: Worker's Compensation | Admitting: Occupational Therapy

## 2020-01-08 DIAGNOSIS — L905 Scar conditions and fibrosis of skin: Secondary | ICD-10-CM

## 2020-01-08 DIAGNOSIS — M25642 Stiffness of left hand, not elsewhere classified: Secondary | ICD-10-CM

## 2020-01-08 DIAGNOSIS — M79642 Pain in left hand: Secondary | ICD-10-CM | POA: Diagnosis not present

## 2020-01-08 DIAGNOSIS — M6281 Muscle weakness (generalized): Secondary | ICD-10-CM

## 2020-01-08 NOTE — Therapy (Signed)
[facility] [address] [place], [address] Phone: [phone]   Fax:  [phone]  Occupational Therapy Treatment  Patient Details  Name: Diamond Sutton MRN: [id number] Date of Birth: 04-22-1985 Referring Provider (OT): Dr Rosita Kea   Encounter Date: 01/08/2020   OT End of Session - 01/08/20 1259    Visit Number 5    Number of Visits 8    Date for OT Re-Evaluation 01/22/20    OT Start Time 1300    OT Stop Time 1332    OT Time Calculation (min) 32 min    Activity Tolerance Patient tolerated treatment well    Behavior During Therapy [facility] for tasks assessed/performed           Past Medical History:  Diagnosis Date  . Anemia   . Depression 2001  . Ectopic pregnancy   . History of urinary tract infection   . Hypertension   . Ovarian cyst     Past Surgical History:  Procedure Laterality Date  . DILATION AND CURETTAGE OF UTERUS     SAB  . TUBAL LIGATION N/A 05/09/2019   Procedure: POST PARTUM TUBAL LIGATION;  Surgeon: Hildred Laser, MD;  Location: [facility] ORS;  Service: Gynecology;  Laterality: N/A;    There were no vitals filed for this visit.   Subjective Assessment - 01/08/20 1300    Subjective  Doing better with scars - not as tender- I can use it with less pain -except when gripping something thin when I have to make tight fist    Pertinent History 34 y.o. female who presents today for evaluation of dog bite to the left middle finger, date of injury 11/21/2019. Patient works for [employer], drives a truck and delivers packages. She was bit by a dog and evaluated in the emergency department where she had puncture wounds to the left middle finger and left thigh. Patient has remained out of work due to inability to grasp and grip as well as pain. She has been taking anti-inflammatory medications. Overall pain and swelling has been improving. She has completed oral antibiotics. She denies any warmth redness  drainage or swelling but does have some catching and locking of the left middle finger with tenderness along the A1 pulley. She also has some tenderness along the puncture sites along the volar and dorsal aspect of the left middle finger with scar tissue present. Left thigh is doing well, asymptomatic - pt had shot 12/20/19 for trigger finger and refer to OT /hand therapy for scar tissue and stiffness    Patient Stated Goals I want the pain better so I can grip and pick up objects without pain  like at work at [employer] , and my kids    Currently in Pain? Yes    Pain Score 2     Pain Location Finger (Comment which one)    Pain Orientation Left    Pain Descriptors / Indicators Tender;Tightness    Pain Type Acute pain    Pain Onset More than a month ago            Flexion 95 at 3rd PIP - full extention - pain only with end range 5 -10 degrees            OT Treatments/Exercises (OP) - 01/08/20 0001      LUE Paraffin   Number Minutes Paraffin 8 Minutes    LUE Paraffin Location Hand    Comments prior to scar mobs  Pt to do at homemoist heat or Contrast - 3 x day Scar massage done on dorsal hand -  scar mobs all directions - appear not adhesions - but more tender than volar scar  - pt to do scar mobs against scar but keep PIP in flexion 90 degrees Ands cont to address desensitization - tapping and vibration    3rd digitPIP volar scar - extend digit - and make sure scar is mobilize3 x day  scar massage - not as hyper sensitive - can tolerate towel and tapping - but scar still adhere and thick in middle Able to tolerate mini massager on both and pain 5/10 during - but decrease if stopped  Responded great on volar scar - softer  desensitization - several times during day- scar mobs, rougher textures. Tapping e - can do short periods but several times during day-REINFORCE  And then silicon sleeve on 3rd one for day time and one for night time- new ones  provided  Cont with  MC block splint for 3rd to wear night time and composite fist activities that can cause triggering   Cont tendon glides - pain free -3 x day after contrast or moist heat        OT Education - 01/08/20 1259    Education Details progress and what to focus on scar massage    Person(s) Educated Patient    Methods Explanation;Demonstration;Tactile cues;Verbal cues;Handout    Comprehension Verbal cues required;Returned demonstration;Verbalized understanding            OT Short Term Goals - 12/25/19 1513      OT SHORT TERM GOAL #1   Title Pt to be indepedent in HEP to decrease scar tissue and pain on PRWHE by more than 20 points    Baseline scar tissue tender - more than 7/10 - PRWHE pain score 32/50 - no knowledge of HEP    Time 2    Period Weeks    Status New    Target Date 01/08/20      OT SHORT TERM GOAL #2   Title Pt able to tolerate massage and different texture and pressure over scars at L 3rd PIP    Baseline pain 7/10 with massage, to soft and rougher textures - pain with motion , cannot dry or wash finger    Time 3    Period Weeks    Status New    Target Date 01/15/20             OT Long Term Goals - 12/25/19 1514      OT LONG TERM GOAL #1   Title Pt AROM in 3rd digits be able to touch palm and gripping broom, knife with pain less than 2/10    Baseline pain 7/10 increase with fexion or grip    Time 2    Period Weeks    Status New    Target Date 01/08/20      OT LONG TERM GOAL #2   Title L hand grip increase with more than 15 lbs to squeeze washcloth and pick up packages wiht no increase symptoms    Baseline grip R 70 , L 35 and increase pain - pain on PRWHE 32/50 and f7/10 with flexion or touching    Time 4    Period Weeks    Target Date 01/22/20      OT LONG TERM GOAL #3   Title PRWHE function score improve wiht more than 25 points    Baseline  at eval score for function on PRWHE 36/50    Time 4    Period Weeks    Status  New    Target Date 01/22/20                 Plan - 01/08/20 1259    Clinical Impression Statement Pt is 6 1/2 wks out form dog bite L 3rd digit PIP joint - pt making progress in scar tissue adhesions, pain and sensititivy - pain decrease to 2/10 - mostly now at 90 degrees flexion , - focus scar massage volar more than dorsal, tapping and vibration for desentitization -tolerating rougher textures better - working on end range for flexion - pain only at 90 to 100 degrees PIP flexion    OT Occupational Profile and History Problem Focused Assessment - Including review of records relating to presenting problem    Occupational performance deficits (Please refer to evaluation for details): ADL's;IADL's;Work;Play;Leisure;Social Participation    Body Structure / Function / Physical Skills Edema;Dexterity;Flexibility;ROM;UE functional use;Scar mobility;Strength;Pain;IADL    Rehab Potential Good    Clinical Decision Making Limited treatment options, no task modification necessary    Comorbidities Affecting Occupational Performance: None    Modification or Assistance to Complete Evaluation  No modification of tasks or assist necessary to complete eval    OT Frequency 2x / week    OT Duration 4 weeks    OT Treatment/Interventions Self-care/ADL training;Moist Heat;Paraffin;Fluidtherapy;Contrast Bath;Therapeutic exercise;Manual Therapy;Passive range of motion;Patient/family education;Scar mobilization    Plan assess progress with HEP and change as needed    OT Home Exercise Plan see pt instruction    Consulted and Agree with Plan of Care Patient           Patient will benefit from skilled therapeutic intervention in order to improve the following deficits and impairments:   Body Structure / Function / Physical Skills: Edema,Dexterity,Flexibility,ROM,UE functional use,Scar mobility,Strength,Pain,IADL       Visit Diagnosis: Scar condition and fibrosis of skin  Stiffness of left hand, not  elsewhere classified  Muscle weakness (generalized)  Pain in left hand    Problem List Patient Active Problem List   Diagnosis Date Noted  . Smoker 09/19/2019  . History of bilateral tubal ligation 05/09/19 09/08/2019  . Heavy period 07/18/2019    Oletta Cohn OTR/L,CLT 01/08/2020, 1:40 PM  [facility] [address], [place], [address] Phone: [phone]   Fax:  [phone]  Name: Diamond Sutton MRN: [id number] Date of Birth: Apr 16, 1985

## 2020-01-16 ENCOUNTER — Ambulatory Visit: Payer: Medicaid Other | Admitting: Physician Assistant

## 2020-01-16 ENCOUNTER — Encounter: Payer: Self-pay | Admitting: Physician Assistant

## 2020-01-16 ENCOUNTER — Other Ambulatory Visit: Payer: Self-pay

## 2020-01-16 DIAGNOSIS — N760 Acute vaginitis: Secondary | ICD-10-CM

## 2020-01-16 DIAGNOSIS — Z113 Encounter for screening for infections with a predominantly sexual mode of transmission: Secondary | ICD-10-CM

## 2020-01-16 DIAGNOSIS — B9689 Other specified bacterial agents as the cause of diseases classified elsewhere: Secondary | ICD-10-CM | POA: Diagnosis not present

## 2020-01-16 LAB — WET PREP FOR TRICH, YEAST, CLUE
Trichomonas Exam: NEGATIVE
Yeast Exam: NEGATIVE

## 2020-01-16 MED ORDER — METRONIDAZOLE 0.75 % VA GEL: 1.0000 | Freq: Every day | VAGINAL | 0 refills | Status: DC

## 2020-01-16 NOTE — Progress Notes (Signed)
  [facility] Department STI clinic/screening visit  Subjective:  Diamond Sutton is a 34 y.o. female being seen today for an STI screening visit. The patient reports they do have symptoms.  Patient reports that they do not desire a pregnancy in the next year.   They reported they are not interested in discussing contraception today.  Patient's last menstrual period was 12/28/2019.   Patient has the following medical conditions:   Patient Active Problem List   Diagnosis Date Noted  . Smoker 09/19/2019  . History of bilateral tubal ligation 05/09/19 09/08/2019  . Heavy period 07/18/2019    Chief Complaint  Patient presents with  . SEXUALLY TRANSMITTED DISEASE    screening    HPI  Patient reports that she has noticed some "cottage cheese" discharge for about 1 week.  Denies other symptoms.  States that she takes iron pills and medicine for her HTN as directed.  Reports she has had BTL for [facility], had her last HIV test earlier this year and her last pap was in 2020.   See flowsheet for further details and programmatic requirements.    The following portions of the patient's history were reviewed and updated as appropriate: allergies, current medications, past medical history, past social history, past surgical history and problem list.  Objective:  There were no vitals filed for this visit.  Physical Exam Constitutional:      General: She is not in acute distress.    Appearance: Normal appearance.  HENT:     Head: Normocephalic and atraumatic.  Eyes:     Conjunctiva/sclera: Conjunctivae normal.  Pulmonary:     Effort: Pulmonary effort is normal.  Skin:    General: Skin is warm and dry.     Findings: No bruising, erythema, lesion or rash.  Neurological:     Mental Status: She is alert and oriented to person, place, and time.  Psychiatric:        Mood and Affect: Mood normal.        Behavior: Behavior normal.        Thought Content: Thought content normal.         Judgment: Judgment normal.      Assessment and Plan:  Diamond Sutton is a 34 y.o. female presenting to [facility] Department for STI screening  1. Screening for STD (sexually transmitted disease) Patient into clinic with symptoms. Patient opts to self-collect vaginal samples for testing today.  Counseled patient how to collect for accurate results. Reviewed wet mount with patient and will treat for BV. Rec condoms with all sex. Await test results.  Counseled that RN will call if needs to RTC for treatment once results are back. - WET PREP FOR TRICH, YEAST, CLUE - Chlamydia/Gonorrhea [place] [facility] - HIV [place] [facility] - Syphilis Serology,  Lab  2. BV (bacterial vaginosis) Will treat for BV with Vandazole gel 1 app qhs for 5-7 days. No sex for 10 days. Enc to use OTC antifungal cream if has some itching during or just after treatment with antibiotic gel. - metroNIDAZOLE (VANDAZOLE) 0.75 % vaginal gel; Place 1 Applicatorful vaginally at bedtime.  Dispense: 70 g; Refill: 0     No follow-ups on file.  Future Appointments  Date Time Provider Department Center  01/17/2020  1:15 PM Oletta Cohn, OT [facility] None  01/29/2020 10:20 AM Malfi, Jodelle Gross, FNP [facility]    Matt Holmes, [place]

## 2020-01-17 ENCOUNTER — Ambulatory Visit: Payer: Worker's Compensation | Admitting: Occupational Therapy

## 2020-01-20 DIAGNOSIS — Z419 Encounter for procedure for purposes other than remedying health state, unspecified: Secondary | ICD-10-CM | POA: Diagnosis not present

## 2020-01-21 NOTE — Progress Notes (Signed)
Chart reviewed by Pharmacist  Suzanne Walker PharmD, Contract Pharmacist at [facility]  

## 2020-01-24 ENCOUNTER — Other Ambulatory Visit: Payer: Self-pay

## 2020-01-24 ENCOUNTER — Encounter: Payer: Self-pay | Admitting: Student

## 2020-01-24 LAB — HM HIV SCREENING LAB: HM HIV Screening: NEGATIVE

## 2020-01-26 ENCOUNTER — Ambulatory Visit: Payer: Worker's Compensation | Admitting: Occupational Therapy

## 2020-01-29 ENCOUNTER — Ambulatory Visit: Payer: Medicaid Other | Admitting: Family Medicine

## 2020-02-01 ENCOUNTER — Other Ambulatory Visit: Payer: Self-pay

## 2020-02-01 ENCOUNTER — Ambulatory Visit: Payer: Worker's Compensation | Attending: Orthopedic Surgery | Admitting: Occupational Therapy

## 2020-02-01 DIAGNOSIS — M6281 Muscle weakness (generalized): Secondary | ICD-10-CM | POA: Diagnosis present

## 2020-02-01 DIAGNOSIS — M79642 Pain in left hand: Secondary | ICD-10-CM | POA: Diagnosis present

## 2020-02-01 DIAGNOSIS — M25642 Stiffness of left hand, not elsewhere classified: Secondary | ICD-10-CM | POA: Insufficient documentation

## 2020-02-01 DIAGNOSIS — L905 Scar conditions and fibrosis of skin: Secondary | ICD-10-CM | POA: Diagnosis not present

## 2020-02-01 NOTE — Therapy (Signed)
[facility] [address], [place], [address] Phone: [phone]   Fax:  [phone]  Occupational Therapy Treatment  Patient Details  Name: JONIA TUROWSKI MRN: [id number] Date of Birth: [birth year] Referring Provider (OT): Dr Rosita Kea   Encounter Date: 02/01/2020   OT End of Session - 02/01/20 1127    Visit Number 6    Number of Visits 9    Date for OT Re-Evaluation 02/22/20    OT Start Time 1110    OT Stop Time 1153    OT Time Calculation (min) 43 min    Activity Tolerance Patient tolerated treatment well    Behavior During Therapy [facility] for tasks assessed/performed           Past Medical History:  Diagnosis Date  . Anemia   . Depression 2001  . Ectopic pregnancy   . History of urinary tract infection   . Hypertension   . Ovarian cyst     Past Surgical History:  Procedure Laterality Date  . DILATION AND CURETTAGE OF UTERUS     SAB  . TUBAL LIGATION N/A 05/09/2019   Procedure: POST PARTUM TUBAL LIGATION;  Surgeon: Hildred Laser, MD;  Location: [facility] ORS;  Service: Gynecology;  Laterality: N/A;    There were no vitals filed for this visit.   Subjective Assessment - 02/01/20 1124    Subjective  My finger and hand cramps when I try and sweep or mop - or doing my girsl hair - it cramps and stay sore for the day - but it also is the colder weather worse - otherwise doing better - I did not see you my Dad had COVID and we stayed week longer up with them    Pertinent History 35 y.o. female who presents today for evaluation of dog bite to the left middle finger, date of injury 11/21/2019. Patient works for [employer], drives a truck and delivers packages. She was bit by a dog and evaluated in the emergency department where she had puncture wounds to the left middle finger and left thigh. Patient has remained out of work due to inability to grasp and grip as well as pain. She has been taking anti-inflammatory  medications. Overall pain and swelling has been improving. She has completed oral antibiotics. She denies any warmth redness drainage or swelling but does have some catching and locking of the left middle finger with tenderness along the A1 pulley. She also has some tenderness along the puncture sites along the volar and dorsal aspect of the left middle finger with scar tissue present. Left thigh is doing well, asymptomatic - pt had shot 12/20/19 for trigger finger and refer to OT /hand therapy for scar tissue and stiffness    Patient Stated Goals I want the pain better so I can grip and pick up objects without pain  like at work at [employer] , and my kids    Currently in Pain? Yes    Pain Score 8    with cramping when gripping something for while   Pain Location Finger (Comment which one)    Pain Orientation Left    Pain Descriptors / Indicators Aching;Cramping;Tender    Pain Type Acute pain    Pain Onset More than a month ago    Pain Frequency Occasional    Aggravating Factors  with gripping mop or broom - and cold weather              [facility]  OT Assessment - 02/01/20 0001      Strength   Right Hand Grip (lbs) 70    Right Hand Lateral Pinch 19 lbs    Right Hand 3 Point Pinch 19 lbs    Left Hand Grip (lbs) 52    Left Hand Lateral Pinch 17 lbs    Left Hand 3 Point Pinch 14 lbs      Left Hand AROM   L Long  MCP 0-90 90 Degrees    L Long PIP 0-100 100 Degrees           AROM at PIP of 3rd WNL now and no pain with only AROM -but with fisting / gripping , pinching  objects- finger and hand cramps And worse per pt now with colder weather  The worse with braiding her daughters hair - and holding mop and broom  Tenderness better in volar and dorsal scars - still thicker and deeper volar scar at PIP        OT Treatments/Exercises (OP) - 02/01/20 0001      LUE Paraffin   Number Minutes Paraffin 8 Minutes    LUE Paraffin Location Hand    Comments prior scar mobs            Scar  massage done- scar mobs all directions -  Not as hyper sensitive anymore- prior to today could not really do a lot of aggressive scar mobs  Had to do some desensitization before    3rd digitPIP volar scar - Able to tolerate mini massager on both this date- responded great to end of session - with volar scar fibrosis Also done xtractor - small  On both with mobs and some AROM  Pt to cont to do scar massage now that can tolerate - and enlarge grip in the meantime and use heat or gloves it hand cold  Cont with MC block splint for 3rd to wear night time and composite fist activities that can cause triggering        OT Education - 02/01/20 1127    Education Details progress and what to focus on scar massage    Person(s) Educated Patient    Methods Explanation;Demonstration;Tactile cues;Verbal cues;Handout    Comprehension Verbal cues required;Returned demonstration;Verbalized understanding            OT Short Term Goals - 02/01/20 1205      OT SHORT TERM GOAL #1   Title Pt to be indepedent in HEP to decrease scar tissue and pain on PRWHE by more than 20 points    Baseline scar tissue tender - more than 7/10 - PRWHE pain score 32/50 - can tolerate now mini massager- pain still 20/50    Time 2    Period Weeks    Status On-going    Target Date 02/15/20      OT SHORT TERM GOAL #2   Title Pt able to tolerate massage and different texture and pressure over scars at L 3rd PIP    Status Achieved             OT Long Term Goals - 02/01/20 1207      OT LONG TERM GOAL #1   Title Pt AROM in 3rd digits be able to touch palm and gripping broom, knife with pain less than 2/10    Baseline pain 7/10 increase with fexion or grip - NOW AROM WNL - and grip and prehension strength increase - but pain still 6-8/10 with prolonged flexion /grip/pinch  Time 3    Period Weeks    Status On-going    Target Date 02/22/20      OT LONG TERM GOAL #2   Title L hand grip increase with more  than 15 lbs to squeeze washcloth and pick up packages wiht no increase symptoms    Baseline grip R 70 , L 52 now was 40 lbs  - pain on PRWHE 32/50 and 7/10 with flexion or touching - pain still with prolonged flexion or grip - 6-8/10 = cramping - PRWHE decrease to 20/50    Time 3    Period Weeks    Status On-going    Target Date 02/22/20      OT LONG TERM GOAL #3   Title PRWHE function score improve wiht more than 25 points    Baseline at eval score for function on PRWHE 36/50 increase functional use - but cramping with prolonged flexion or grip    Time 3    Period Weeks    Status On-going    Target Date 02/22/20                 Plan - 02/01/20 1128    Clinical Impression Statement Pt is 10 1/2 wks out from dog bite L 3rd digit PIP - pt was not seen since prior to Christmas- was out of town and then Dad had COVID- pt can tolerate now different textures and massage - able to do this date mini massager to volar scar - was not able to do that before - volar scar thicker and causing some discomfort with prolonged flexion to PIP - AROM in PIP WNL - and grip and prehension increased - would recommend 2-3 more session with focus on volar scar tissue to decrease pain with prolonged flexion or grip    OT Occupational Profile and History Problem Focused Assessment - Including review of records relating to presenting problem    Occupational performance deficits (Please refer to evaluation for details): ADL's;IADL's;Work;Play;Leisure;Social Participation    Body Structure / Function / Physical Skills Edema;Dexterity;Flexibility;ROM;UE functional use;Scar mobility;Strength;Pain;IADL    Rehab Potential Good    Clinical Decision Making Limited treatment options, no task modification necessary    Comorbidities Affecting Occupational Performance: None    Modification or Assistance to Complete Evaluation  No modification of tasks or assist necessary to complete eval    OT Frequency 1x / week    OT  Duration --   3 wks   OT Treatment/Interventions Self-care/ADL training;Moist Heat;Paraffin;Fluidtherapy;Contrast Bath;Therapeutic exercise;Manual Therapy;Passive range of motion;Patient/family education;Scar mobilization    Plan assess progress with HEP and change as needed    OT Home Exercise Plan see pt instruction    Consulted and Agree with Plan of Care Patient           Patient will benefit from skilled therapeutic intervention in order to improve the following deficits and impairments:   Body Structure / Function / Physical Skills: Edema,Dexterity,Flexibility,ROM,UE functional use,Scar mobility,Strength,Pain,IADL       Visit Diagnosis: Scar condition and fibrosis of skin  Stiffness of left hand, not elsewhere classified  Muscle weakness (generalized)  Pain in left hand    Problem List Patient Active Problem List   Diagnosis Date Noted  . Smoker 09/19/2019  . History of bilateral tubal ligation 05/09/19 09/08/2019  . Heavy period 07/18/2019    Oletta Cohn OTR/L,CLT 02/01/2020, 12:10 PM  [place] [facility] [address] [place], [place],  [address] Phone: [phone]   Fax:  [phone]  Name: CHERITH LILEY MRN: [id number] Date of Birth: 1985/11/20

## 2020-02-09 ENCOUNTER — Other Ambulatory Visit: Payer: Self-pay

## 2020-02-09 ENCOUNTER — Ambulatory Visit: Payer: Worker's Compensation | Admitting: Occupational Therapy

## 2020-02-09 DIAGNOSIS — M25642 Stiffness of left hand, not elsewhere classified: Secondary | ICD-10-CM

## 2020-02-09 DIAGNOSIS — M79642 Pain in left hand: Secondary | ICD-10-CM

## 2020-02-09 DIAGNOSIS — L905 Scar conditions and fibrosis of skin: Secondary | ICD-10-CM | POA: Diagnosis not present

## 2020-02-09 DIAGNOSIS — M6281 Muscle weakness (generalized): Secondary | ICD-10-CM

## 2020-02-09 NOTE — Therapy (Signed)
[facility] [address] [place], [address] Phone: [phone]   Fax:  [phone]  Occupational Therapy Treatment  Patient Details  Name: Diamond Sutton MRN: [id number] Date of Birth: May 20, 1985 Referring Provider (OT): Dr Rosita Kea   Encounter Date: 02/09/2020   OT End of Session - 02/09/20 1413    Visit Number 7    Number of Visits 9    Date for OT Re-Evaluation 02/22/20    OT Start Time 1030    OT Stop Time 1058    OT Time Calculation (min) 28 min    Activity Tolerance Patient tolerated treatment well    Behavior During Therapy [facility] for tasks assessed/performed           Past Medical History:  Diagnosis Date  . Anemia   . Depression 2001  . Ectopic pregnancy   . History of urinary tract infection   . Hypertension   . Ovarian cyst     Past Surgical History:  Procedure Laterality Date  . DILATION AND CURETTAGE OF UTERUS     SAB  . TUBAL LIGATION N/A 05/09/2019   Procedure: POST PARTUM TUBAL LIGATION;  Surgeon: Hildred Laser, MD;  Location: [facility] ORS;  Service: Gynecology;  Laterality: N/A;    There were no vitals filed for this visit.   Subjective Assessment - 02/09/20 1409    Subjective  Pain is better- it is just worse in this cold weather - cramping or spasm - but pain is now about 5/10 at the worse - mostly in that middle and sometimes the ring finger wants to go with - seen the Dr    Pertinent History 35 y.o. female who presents today for evaluation of dog bite to the left middle finger, date of injury 11/21/2019. Patient works for [employer], drives a truck and delivers packages. She was bit by a dog and evaluated in the emergency department where she had puncture wounds to the left middle finger and left thigh. Patient has remained out of work due to inability to grasp and grip as well as pain. She has been taking anti-inflammatory medications. Overall pain and swelling has been improving. She has  completed oral antibiotics. She denies any warmth redness drainage or swelling but does have some catching and locking of the left middle finger with tenderness along the A1 pulley. She also has some tenderness along the puncture sites along the volar and dorsal aspect of the left middle finger with scar tissue present. Left thigh is doing well, asymptomatic - pt had shot 12/20/19 for trigger finger and refer to OT /hand therapy for scar tissue and stiffness    Patient Stated Goals I want the pain better so I can grip and pick up objects without pain  like at work at [employer] , and my kids    Currently in Pain? Yes    Pain Score 5     Pain Location Finger (Comment which one)    Pain Orientation Left    Pain Descriptors / Indicators Aching;Cramping    Pain Type Acute pain    Pain Onset More than a month ago    Pain Frequency Occasional              [facility] OT Assessment - 02/09/20 0001      Strength   Right Hand Grip (lbs) 70    Right Hand Lateral Pinch 19 lbs    Right Hand 3 Point Pinch 19 lbs  Left Hand Grip (lbs) 60    Left Hand Lateral Pinch 18 lbs    Left Hand 3 Point Pinch 17 lbs      Left Hand AROM   L Long  MCP 0-90 90 Degrees    L Long PIP 0-100 100 Degrees             AROM at PIP of 3rd WNL and no pain with only AROM - report her pain decrease from 8/10 to 5/10 now with fisting / gripping , pinching  objects- finger and hand cramps And worse per pt still with colder weather  The worse with braiding her daughters hair - and holding mop and broom but improving Tenderness better in volar and dorsal scars - still thicker and deeper volar scar at PIP but smaller than last time          OT Treatments/Exercises (OP) - 02/09/20 0001      LUE Paraffin   Number Minutes Paraffin 8 Minutes    LUE Paraffin Location Hand    Comments prior to scar mobs             Scar massage done- scar mobs all directions -  3rd digitPIP volar scar - Able to tolerate mini  massager on both this date- responded great to end of session - with volar scar fibrosis Also done xtractor - small  On both with mobs and some AROM  Pt to cont to do scar massage now that can tolerate - and enlarge grip in the meantime and use heat or gloves it hand cold Tender or pain with dorsal scar only when mobs done distally             OT Education - 02/09/20 1413    Education Details progress and what to focus on scar massage    Person(s) Educated Patient    Methods Explanation;Demonstration;Tactile cues;Verbal cues;Handout    Comprehension Verbal cues required;Returned demonstration;Verbalized understanding            OT Short Term Goals - 02/01/20 1205      OT SHORT TERM GOAL #1   Title Pt to be indepedent in HEP to decrease scar tissue and pain on PRWHE by more than 20 points    Baseline scar tissue tender - more than 7/10 - PRWHE pain score 32/50 - can tolerate now mini massager- pain still 20/50    Time 2    Period Weeks    Status On-going    Target Date 02/15/20      OT SHORT TERM GOAL #2   Title Pt able to tolerate massage and different texture and pressure over scars at L 3rd PIP    Status Achieved             OT Long Term Goals - 02/01/20 1207      OT LONG TERM GOAL #1   Title Pt AROM in 3rd digits be able to touch palm and gripping broom, knife with pain less than 2/10    Baseline pain 7/10 increase with fexion or grip - NOW AROM WNL - and grip and prehension strength increase - but pain still 6-8/10 with prolonged flexion /grip/pinch    Time 3    Period Weeks    Status On-going    Target Date 02/22/20      OT LONG TERM GOAL #2   Title L hand grip increase with more than 15 lbs to squeeze washcloth and pick up packages wiht no increase  symptoms    Baseline grip R 70 , L 52 now was 40 lbs  - pain on PRWHE 32/50 and 7/10 with flexion or touching - pain still with prolonged flexion or grip - 6-8/10 = cramping - PRWHE decrease to 20/50    Time  3    Period Weeks    Status On-going    Target Date 02/22/20      OT LONG TERM GOAL #3   Title PRWHE function score improve wiht more than 25 points    Baseline at eval score for function on PRWHE 36/50 increase functional use - but cramping with prolonged flexion or grip    Time 3    Period Weeks    Status On-going    Target Date 02/22/20                 Plan - 02/09/20 1414    Clinical Impression Statement Pt is about 11 1/2 wks out from dog bite to L 3rd digit PIP - pt since last week show decrease pain from 8/10 to 5/10 with gripping with L hand and in cold weather - scar tissue improving - still thicker on volar PIP but decreasing in size -and dorsal scar only painfull with mobs distall y - showed increase grip and prehension strength - AROM and strength WNL now - only pain and scar tissue - possibly 2 more sessions    OT Occupational Profile and History Problem Focused Assessment - Including review of records relating to presenting problem    Occupational performance deficits (Please refer to evaluation for details): ADL's;IADL's;Work;Play;Leisure;Social Participation    Body Structure / Function / Physical Skills Edema;Dexterity;Flexibility;ROM;UE functional use;Scar mobility;Strength;Pain;IADL    Rehab Potential Good    Clinical Decision Making Limited treatment options, no task modification necessary    Comorbidities Affecting Occupational Performance: None    Modification or Assistance to Complete Evaluation  No modification of tasks or assist necessary to complete eval    OT Frequency 1x / week    OT Duration 2 weeks    OT Treatment/Interventions Self-care/ADL training;Moist Heat;Paraffin;Fluidtherapy;Contrast Bath;Therapeutic exercise;Manual Therapy;Passive range of motion;Patient/family education;Scar mobilization    Plan assess progress with HEP and change as needed    OT Home Exercise Plan see pt instruction    Consulted and Agree with Plan of Care Patient            Patient will benefit from skilled therapeutic intervention in order to improve the following deficits and impairments:   Body Structure / Function / Physical Skills: Edema,Dexterity,Flexibility,ROM,UE functional use,Scar mobility,Strength,Pain,IADL       Visit Diagnosis: Scar condition and fibrosis of skin  Stiffness of left hand, not elsewhere classified  Muscle weakness (generalized)  Pain in left hand    Problem List Patient Active Problem List   Diagnosis Date Noted  . Smoker 09/19/2019  . History of bilateral tubal ligation 05/09/19 09/08/2019  . Heavy period 07/18/2019    Oletta Cohn OTR/L,CLT 02/09/2020, 2:18 PM   [facility] [address] [place], [address] Phone: [phone]   Fax:  [phone]  Name: Diamond Sutton MRN: [id number] Date of Birth: October 22, 1985

## 2020-02-15 ENCOUNTER — Encounter: Payer: Medicaid Other | Admitting: Certified Nurse Midwife

## 2020-02-16 ENCOUNTER — Ambulatory Visit: Payer: Worker's Compensation | Admitting: Occupational Therapy

## 2020-02-20 ENCOUNTER — Ambulatory Visit: Payer: Worker's Compensation | Attending: Orthopedic Surgery | Admitting: Occupational Therapy

## 2020-02-20 DIAGNOSIS — Z419 Encounter for procedure for purposes other than remedying health state, unspecified: Secondary | ICD-10-CM | POA: Diagnosis not present

## 2020-02-29 ENCOUNTER — Other Ambulatory Visit: Payer: Self-pay | Admitting: Physician Assistant

## 2020-02-29 ENCOUNTER — Other Ambulatory Visit: Payer: Self-pay

## 2020-02-29 DIAGNOSIS — B9689 Other specified bacterial agents as the cause of diseases classified elsewhere: Secondary | ICD-10-CM

## 2020-03-01 ENCOUNTER — Encounter: Payer: Medicaid Other | Admitting: Certified Nurse Midwife

## 2020-03-06 ENCOUNTER — Other Ambulatory Visit: Payer: Self-pay

## 2020-03-06 ENCOUNTER — Ambulatory Visit: Payer: Medicaid Other | Admitting: Physician Assistant

## 2020-03-06 ENCOUNTER — Encounter: Payer: Self-pay | Admitting: Physician Assistant

## 2020-03-06 DIAGNOSIS — Z113 Encounter for screening for infections with a predominantly sexual mode of transmission: Secondary | ICD-10-CM | POA: Diagnosis not present

## 2020-03-06 DIAGNOSIS — N760 Acute vaginitis: Secondary | ICD-10-CM | POA: Diagnosis not present

## 2020-03-06 LAB — WET PREP FOR TRICH, YEAST, CLUE
Trichomonas Exam: NEGATIVE
Yeast Exam: NEGATIVE

## 2020-03-06 MED ORDER — METRONIDAZOLE 0.75 % VA GEL: 1.0000 | Freq: Every day | VAGINAL | 0 refills | Status: AC

## 2020-03-06 NOTE — Progress Notes (Signed)
  [facility] Department STI clinic/screening visit  Subjective:  Diamond Sutton is a 35 y.o. female being seen today for an STI screening visit. The patient reports they do have symptoms.  Patient reports that they do not desire a pregnancy in the next year.   They reported they are not interested in discussing contraception today.  Patient's last menstrual period was 02/27/2020 (exact date).   Patient has the following medical conditions:   Patient Active Problem List   Diagnosis Date Noted  . Smoker 09/19/2019  . History of bilateral tubal ligation 05/09/19 09/08/2019  . Heavy period 07/18/2019    Chief Complaint  Patient presents with  . SEXUALLY TRANSMITTED DISEASE    STD screening including bloodwork    HPI  Patient reports that she has been having vaginal odor and irritation for 1 week.  Denies other symptoms,chronic conditions and regular medicines.  States last HIV test was in 12/2019 and last pap was in 10/2018.  See flowsheet for further details and programmatic requirements.    The following portions of the patient's history were reviewed and updated as appropriate: allergies, current medications, past medical history, past social history, past surgical history and problem list.  Objective:  There were no vitals filed for this visit.  Physical Exam Constitutional:      General: She is not in acute distress.    Appearance: Normal appearance.  HENT:     Head: Normocephalic and atraumatic.  Eyes:     Conjunctiva/sclera: Conjunctivae normal.  Pulmonary:     Effort: Pulmonary effort is normal.  Skin:    General: Skin is warm and dry.     Findings: No bruising, erythema, lesion or rash.  Neurological:     Mental Status: She is alert and oriented to person, place, and time.  Psychiatric:        Mood and Affect: Mood normal.        Behavior: Behavior normal.        Thought Content: Thought content normal.        Judgment: Judgment normal.       Assessment and Plan:  [name] is a 35 y.o. female presenting to the [facility] Department for STI screening  1. Screening for STD (sexually transmitted disease) Patient into clinic with symptoms. Patient declines pelvic exam by provider and opts to self-collect vaginal samples.  Counseled how to collect samples for accurate results. Rec condoms with all sex. Await test results.  Counseled that RN will call if needs to RTC for treatment once results are back. - WET PREP FOR TRICH, YEAST, CLUE - Chlamydia/Gonorrhea [facility] - HIV [place] [facility] - Syphilis Serology, [place] [facility]  2. BV (bacterial vaginosis) Treat for BV with Vandazole 0.75% gel 1 app qhs for 5-7 days. No sex for 10 days. Enc to use OTC antifungal cream if has itching during or just after use of antibiotic gel. - metroNIDAZOLE (VANDAZOLE) 0.75 % vaginal gel; Place 1 Applicatorful vaginally at bedtime for 7 days.  Dispense: 70 g; Refill: 0     No follow-ups on file.  No future appointments.  Matt Holmes, PA

## 2020-03-06 NOTE — Progress Notes (Signed)
Wet mount reviewed by provider. Per provider order, dispensed Vandazole/Metronidazole Gel. Provider orders completed.

## 2020-03-19 DIAGNOSIS — Z419 Encounter for procedure for purposes other than remedying health state, unspecified: Secondary | ICD-10-CM | POA: Diagnosis not present

## 2020-04-03 ENCOUNTER — Ambulatory Visit: Payer: Medicaid Other

## 2020-04-18 ENCOUNTER — Ambulatory Visit: Payer: Medicaid Other

## 2020-04-19 DIAGNOSIS — Z419 Encounter for procedure for purposes other than remedying health state, unspecified: Secondary | ICD-10-CM | POA: Diagnosis not present

## 2020-04-24 ENCOUNTER — Ambulatory Visit: Payer: Medicaid Other

## 2020-04-30 ENCOUNTER — Encounter: Payer: Self-pay | Admitting: Physician Assistant

## 2020-04-30 ENCOUNTER — Other Ambulatory Visit: Payer: Self-pay

## 2020-04-30 ENCOUNTER — Ambulatory Visit: Payer: Medicaid Other | Admitting: Physician Assistant

## 2020-04-30 DIAGNOSIS — N760 Acute vaginitis: Secondary | ICD-10-CM | POA: Diagnosis not present

## 2020-04-30 DIAGNOSIS — Z113 Encounter for screening for infections with a predominantly sexual mode of transmission: Secondary | ICD-10-CM | POA: Diagnosis not present

## 2020-04-30 DIAGNOSIS — B9689 Other specified bacterial agents as the cause of diseases classified elsewhere: Secondary | ICD-10-CM

## 2020-04-30 LAB — WET PREP FOR TRICH, YEAST, CLUE
Trichomonas Exam: NEGATIVE
Yeast Exam: NEGATIVE

## 2020-04-30 MED ORDER — METRONIDAZOLE 0.75 % VA GEL: 1.0000 | Freq: Two times a day (BID) | VAGINAL | 0 refills | Status: DC

## 2020-04-30 MED ORDER — METRONIDAZOLE 0.75 % VA GEL: 1.0000 | Freq: Every day | VAGINAL | 0 refills | Status: DC

## 2020-04-30 NOTE — Progress Notes (Signed)
  [facility] Department STI clinic/screening visit  Subjective:  Diamond Sutton is a 35 y.o. female being seen today for an STI screening visit. The patient reports they do have symptoms.  Patient reports that they do not desire a pregnancy in the next year.   They reported they are not interested in discussing contraception today.  No LMP recorded.   Patient has the following medical conditions:   Patient Active Problem List   Diagnosis Date Noted  . Smoker 09/19/2019  . History of bilateral tubal ligation 05/09/19 09/08/2019  . Heavy period 07/18/2019    Chief Complaint  Patient presents with  . SEXUALLY TRANSMITTED DISEASE    screening    HPI  Patient reports that she has had abdominal cramping, vaginal irritation and odor for 2 weeks.  Denies other symptoms, chronic conditions and regular medicines.  States last HIV test was at her last visit in 02/2020 and last pap was in 2021.  LMP was 04/22/2020 and is using BTL as her BCM.   See flowsheet for further details and programmatic requirements.    The following portions of the patient's history were reviewed and updated as appropriate: allergies, current medications, past medical history, past social history, past surgical history and problem list.  Objective:  There were no vitals filed for this visit.  Physical Exam Constitutional:      General: She is not in acute distress.    Appearance: Normal appearance.  HENT:     Head: Normocephalic and atraumatic.  Eyes:     Conjunctiva/sclera: Conjunctivae normal.  Pulmonary:     Effort: Pulmonary effort is normal.  Skin:    General: Skin is warm and dry.  Neurological:     Mental Status: She is alert and oriented to person, place, and time.  Psychiatric:        Mood and Affect: Mood normal.        Behavior: Behavior normal.        Thought Content: Thought content normal.        Judgment: Judgment normal.      Assessment and Plan:  Diamond Sutton  is a 35 y.o. female presenting to the [facility] Department for STI screening  1. Screening for STD (sexually transmitted disease) Patient into clinic with symptoms. Patient declines pelvic exam and opts to self-collect samples for testing today.  Counseled how to collect for accurate results. Rec condoms with all sex. Await test results.  Counseled that RN will call if needs to RTC for treatment once results are back. - WET PREP FOR TRICH, YEAST, CLUE - Chlamydia/Gonorrhea [place] [facility] - HIV [facility] - Syphilis Serology,  Lab  2. BV (bacterial vaginosis) Will treat BV with Vandazole 0.75% vaginal gel 1 app qhs for 5-7 days. No sex for 10 days. Enc to use OTC antifungal cream if has itching during or just after treatment with antibiotic. - metroNIDAZOLE (VANDAZOLE) 0.75 % vaginal gel; Place 1 Applicatorful vaginally at bedtime.  Dispense: 70 g; Refill: 0     No follow-ups on file.  No future appointments.  Matt Holmes, PA

## 2020-05-04 NOTE — Progress Notes (Signed)
Chart reviewed by Pharmacist  Suzanne Walker PharmD, Contract Pharmacist at [facility]  

## 2020-05-06 LAB — HM HIV SCREENING LAB: HM HIV Screening: NEGATIVE

## 2020-06-13 ENCOUNTER — Ambulatory Visit: Payer: Medicaid Other

## 2020-06-19 ENCOUNTER — Ambulatory Visit: Payer: Medicaid Other

## 2020-06-23 ENCOUNTER — Other Ambulatory Visit: Payer: Self-pay | Admitting: Physician Assistant

## 2020-06-23 DIAGNOSIS — N760 Acute vaginitis: Secondary | ICD-10-CM

## 2020-06-23 DIAGNOSIS — B9689 Other specified bacterial agents as the cause of diseases classified elsewhere: Secondary | ICD-10-CM

## 2020-06-24 NOTE — Telephone Encounter (Signed)
Refill request sent by pharmacy.  Request is declined.

## 2020-06-25 ENCOUNTER — Other Ambulatory Visit: Payer: Self-pay

## 2020-06-25 ENCOUNTER — Ambulatory Visit: Payer: Medicaid Other | Admitting: Advanced Practice Midwife

## 2020-06-25 DIAGNOSIS — Z62810 Personal history of physical and sexual abuse in childhood: Secondary | ICD-10-CM | POA: Insufficient documentation

## 2020-06-25 DIAGNOSIS — Z113 Encounter for screening for infections with a predominantly sexual mode of transmission: Secondary | ICD-10-CM

## 2020-06-25 LAB — WET PREP FOR TRICH, YEAST, CLUE
Trichomonas Exam: NEGATIVE
Yeast Exam: NEGATIVE

## 2020-06-25 NOTE — Progress Notes (Signed)
[facility] Department STI clinic/screening visit  Subjective:  Diamond Sutton is a 35 y.o. SBF G10P8 smoker female being seen today for an STI screening visit. The patient reports they do have symptoms.  Patient reports that they do not desire a pregnancy in the next year.   They reported they are not interested in discussing contraception today.  No LMP recorded.   Patient has the following medical conditions:   Patient Active Problem List   Diagnosis Date Noted  . Smoker 2 cpd 09/19/2019  . History of bilateral tubal ligation 05/09/19 09/08/2019  . Heavy period 07/18/2019    Chief Complaint  Patient presents with  . SEXUALLY TRANSMITTED DISEASE    STD screening except bloodwork    HPI  Patient reports malodor x 1.5 weeks. Last sex 05/30/20 without condom; with current partner x 8 years;1 sex partner in last 3 mo.  LMP 06/17/20. Last ETOH 6/5/222 (7 shots liquor) 2x/month. Smoker 2 cpd. BTL 2021.  Last HIV test per patient/review of record was 05/06/20 Patient reports last pap was 11/15/2018 neg HPV neg  See flowsheet for further details and programmatic requirements.    The following portions of the patient's history were reviewed and updated as appropriate: allergies, current medications, past medical history, past social history, past surgical history and problem list.  Objective:  There were no vitals filed for this visit.  Physical Exam Vitals and nursing note reviewed.  Constitutional:      Appearance: Normal appearance. She is obese.  HENT:     Head: Normocephalic and atraumatic.     Mouth/Throat:     Mouth: Mucous membranes are moist.     Pharynx: Oropharynx is clear. No oropharyngeal exudate or posterior oropharyngeal erythema.  Neck:     Comments: Negative cervical lymphadenopathy Pulmonary:     Effort: Pulmonary effort is normal.  Chest:  Breasts:     Right: No axillary adenopathy or supraclavicular adenopathy.     Left: No axillary  adenopathy or supraclavicular adenopathy.    Abdominal:     Palpations: Abdomen is soft. There is no mass.     Tenderness: There is no abdominal tenderness. There is no rebound.     Comments: Soft without masses or tenderness, poor tone  Genitourinary:    General: Normal vulva.     Exam position: Lithotomy position.     Pubic Area: No rash or pubic lice.      Labia:        Right: No rash or lesion.        Left: No rash or lesion.      Vagina: Normal. No vaginal discharge (white creamy leukorrhea, ph>4.5), erythema, bleeding or lesions.     Cervix: Normal.     Uterus: Normal.      Adnexa: Right adnexa normal and left adnexa normal.     Rectum: Normal.  Lymphadenopathy:     Head:     Right side of head: No preauricular or posterior auricular adenopathy.     Left side of head: No preauricular or posterior auricular adenopathy.     Cervical: No cervical adenopathy.     Upper Body:     Right upper body: No supraclavicular or axillary adenopathy.     Left upper body: No supraclavicular or axillary adenopathy.     Lower Body: No right inguinal adenopathy. No left inguinal adenopathy.  Skin:    General: Skin is warm and dry.     Findings: No rash.  Neurological:     Mental Status: She is alert and oriented to person, place, and time.      Assessment and Plan:  Diamond Sutton is a 35 y.o. female presenting to the [facility] Department for STI screening  1. Screening examination for venereal disease Treat wet mount per standing orders Immunization nurse consult - WET PREP FOR TRICH, YEAST, CLUE - Chlamydia/Gonorrhea [facility]     No follow-ups on file.  No future appointments.  Alberteen Spindle, CNM

## 2020-06-25 NOTE — Progress Notes (Signed)
Wet mount reviewed, no tx per standing order. Provider orders completed. 

## 2020-07-25 ENCOUNTER — Other Ambulatory Visit: Payer: Self-pay | Admitting: Physician Assistant

## 2020-07-25 DIAGNOSIS — N760 Acute vaginitis: Secondary | ICD-10-CM

## 2020-07-25 DIAGNOSIS — B9689 Other specified bacterial agents as the cause of diseases classified elsewhere: Secondary | ICD-10-CM

## 2020-07-25 NOTE — Telephone Encounter (Signed)
Patient last seen about 1 month ago.  Will need a visit prior to prescribing medicine.

## 2020-07-29 ENCOUNTER — Encounter: Payer: Self-pay | Admitting: Physician Assistant

## 2020-07-29 ENCOUNTER — Other Ambulatory Visit: Payer: Self-pay | Admitting: Physician Assistant

## 2020-07-29 DIAGNOSIS — B9689 Other specified bacterial agents as the cause of diseases classified elsewhere: Secondary | ICD-10-CM

## 2020-07-30 ENCOUNTER — Other Ambulatory Visit: Payer: Self-pay

## 2020-07-30 ENCOUNTER — Ambulatory Visit: Payer: Medicaid Other | Admitting: Physician Assistant

## 2020-07-30 DIAGNOSIS — Z113 Encounter for screening for infections with a predominantly sexual mode of transmission: Secondary | ICD-10-CM

## 2020-07-30 DIAGNOSIS — Z299 Encounter for prophylactic measures, unspecified: Secondary | ICD-10-CM

## 2020-07-30 LAB — WET PREP FOR TRICH, YEAST, CLUE
Trichomonas Exam: NEGATIVE
Yeast Exam: NEGATIVE

## 2020-07-30 MED ORDER — METRONIDAZOLE 500 MG PO TABS: 2000.0000 mg | ORAL_TABLET | Freq: Once | ORAL | 0 refills | Status: AC

## 2020-07-30 MED ORDER — CLOTRIMAZOLE 1 % VA CREA: 1.0000 | TOPICAL_CREAM | Freq: Every day | VAGINAL | 0 refills | Status: AC

## 2020-07-31 ENCOUNTER — Encounter: Payer: Self-pay | Admitting: Physician Assistant
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
## 2020-08-03 NOTE — Progress Notes (Signed)
Chart reviewed by Pharmacist  Suzanne Walker PharmD, Contract Pharmacist at [facility]  

## 2020-08-13 ENCOUNTER — Ambulatory Visit: Payer: Self-pay

## 2020-08-13 ENCOUNTER — Ambulatory Visit: Payer: Medicaid Other

## 2020-08-13 NOTE — Progress Notes (Deleted)
Pt was a no show today.  The lab was unable to test her swab for Gonorrhea/Chlamydia, from last visit.  Pt needs to self collect or have a Provider swab her for Gonorrhea/Chlamydia

## 2020-08-17 ENCOUNTER — Other Ambulatory Visit: Payer: Self-pay | Admitting: Physician Assistant

## 2020-08-17 DIAGNOSIS — B9689 Other specified bacterial agents as the cause of diseases classified elsewhere: Secondary | ICD-10-CM

## 2020-08-17 IMAGING — US US OB COMP LESS 14 WK
1 series · 13 of 28 positions shown · non-contrast
Comparison: None available.

CLINICAL DATA: Initial evaluation for acute right lower quadrant
pain, early pregnancy.

EXAM:
OBSTETRIC <14 WK ULTRASOUND
TECHNIQUE: Transabdominal ultrasound was performed for evaluation of the
gestation as well as the maternal uterus and adnexal regions.

[Series 1: us ob comp less 14 wk · 13 of 56 slices shown]
[im 3/56]
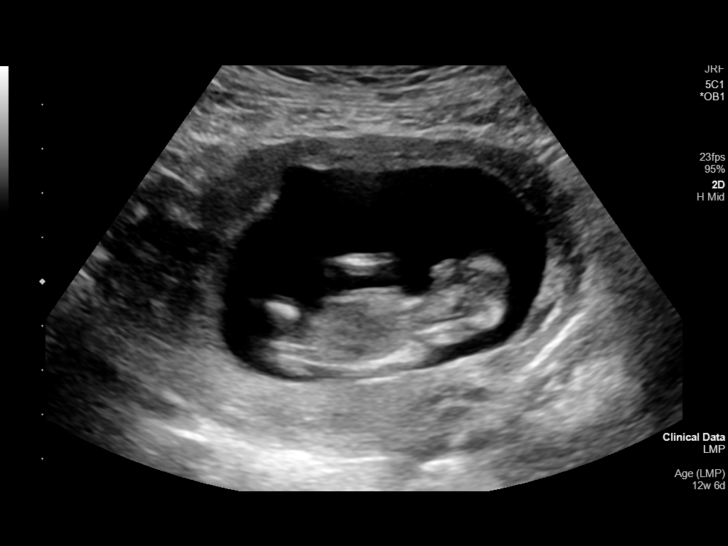
[im 7/56]
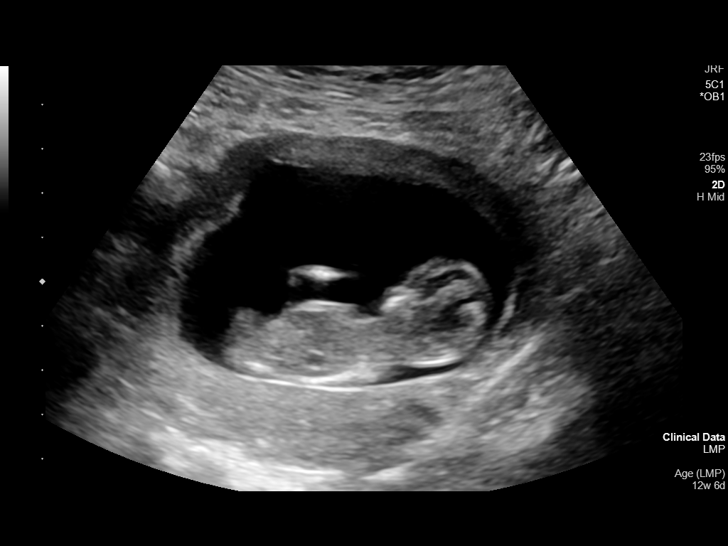
[im 11/56]
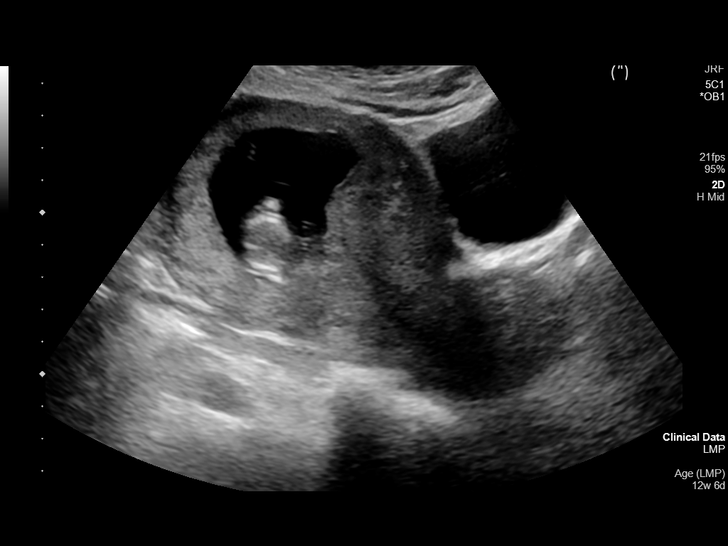
[im 15/56]
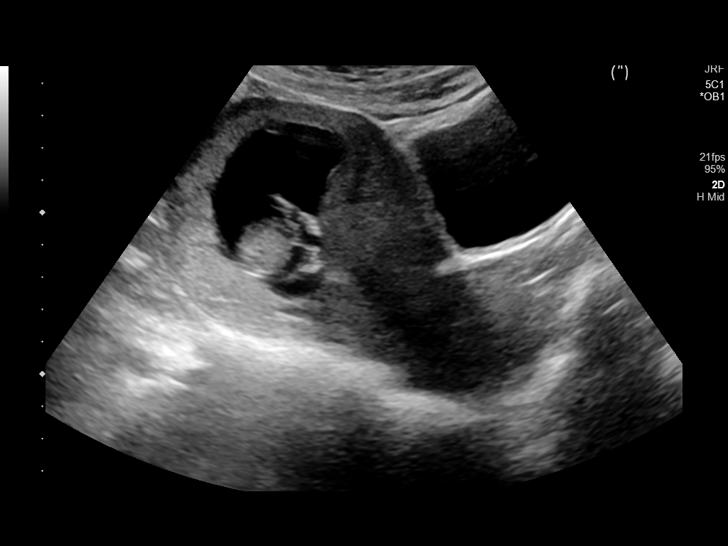
[im 19/56]
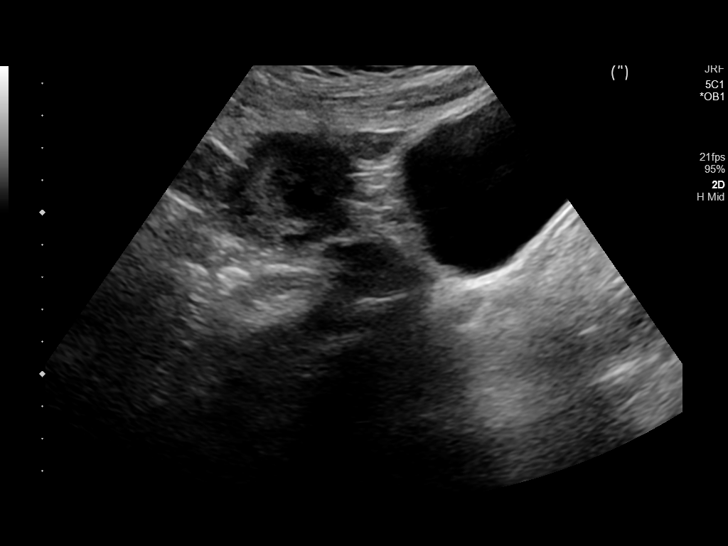
[im 23/56]
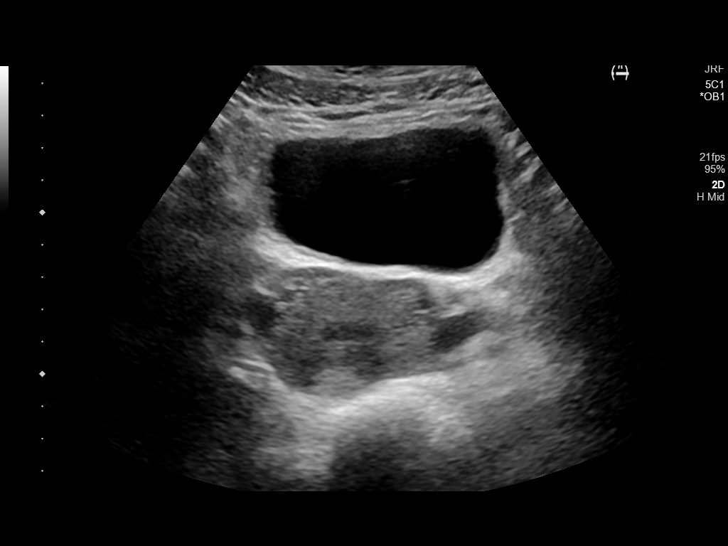
[im 29/56]
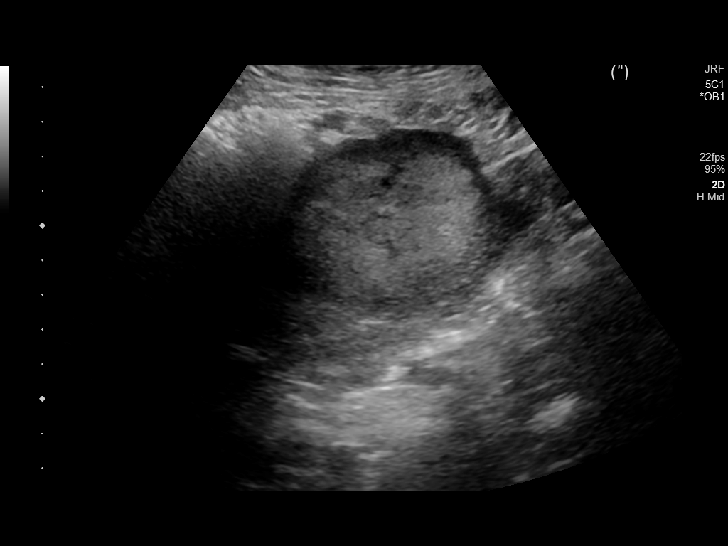
[im 33/56]
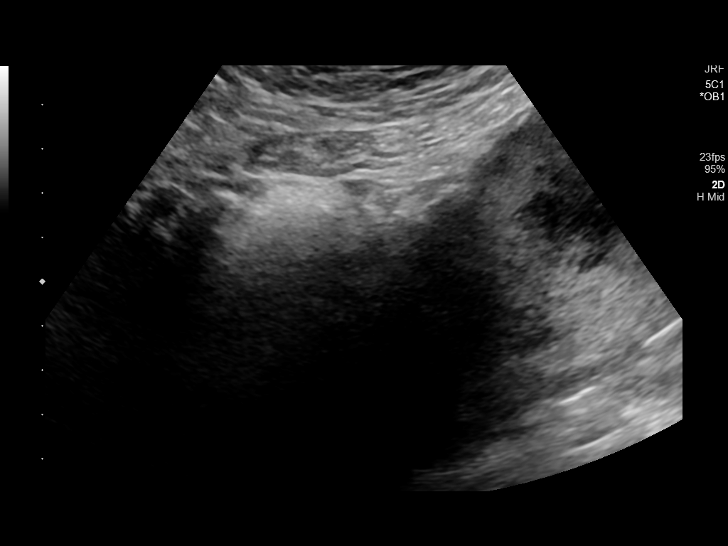
[im 37/56]
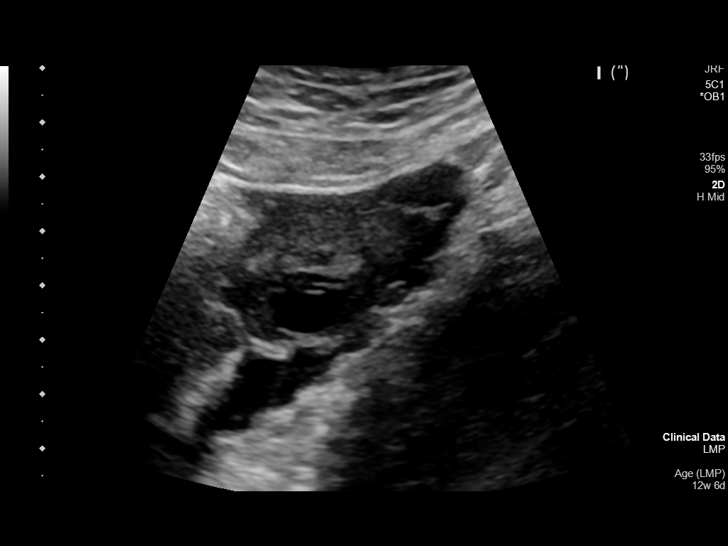
[im 41/56]
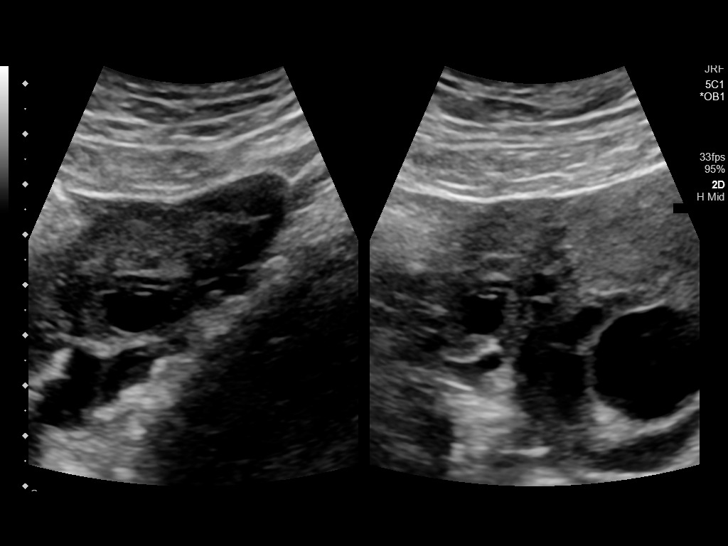
[im 45/56]
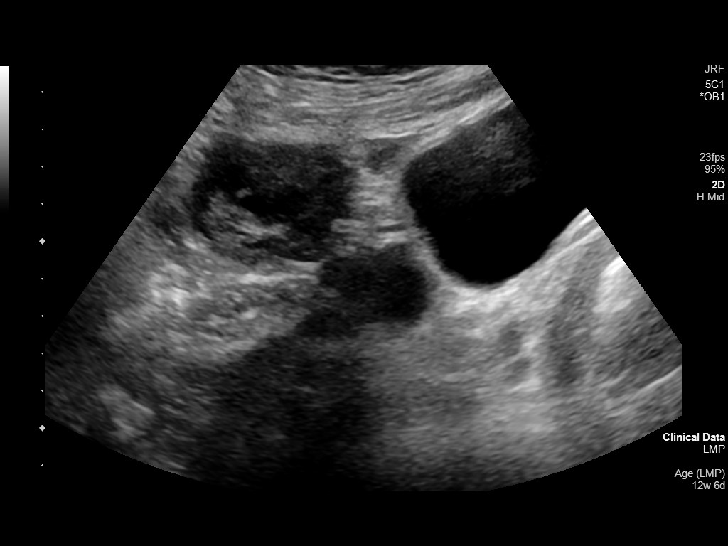
[im 49/56]
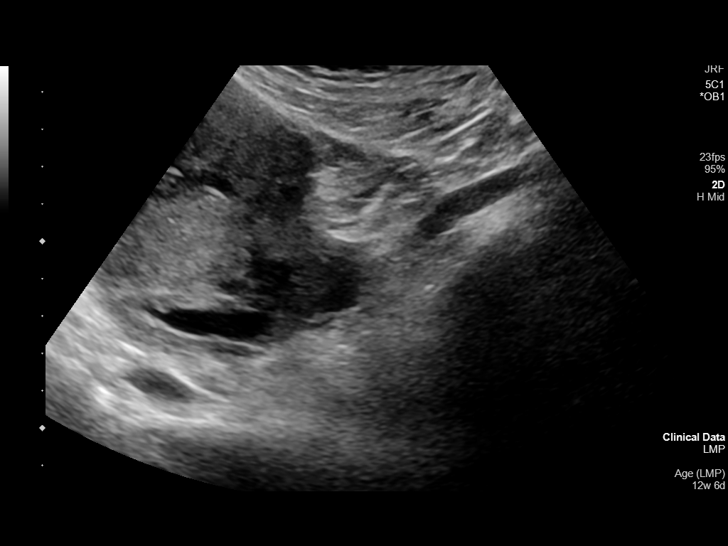
[im 53/56]
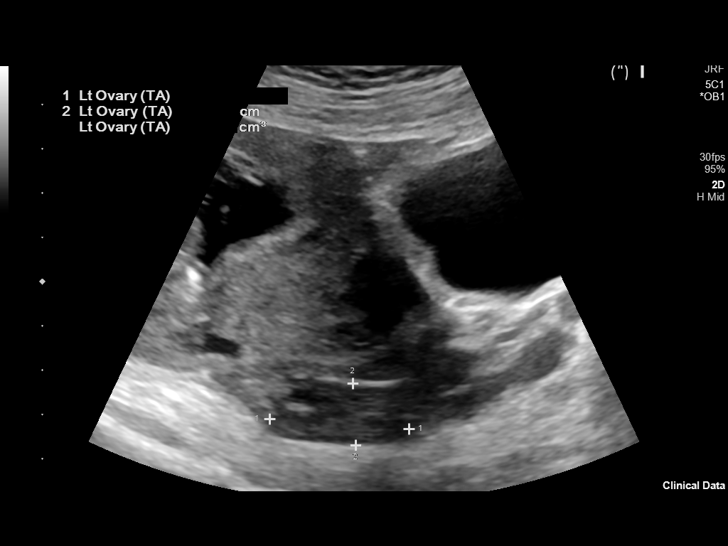

[13 of 28 positions shown; findings below may reference images not displayed]

FINDINGS: Intrauterine gestational sac: Single

Yolk sac:  Not visualized.

Embryo:  Present

Cardiac Activity: Present

Heart Rate: 162 bpm

CRL: 62.7 mm   12 w 5 d                  US EDC: 06/02/2019

Subchorionic hemorrhage:  None visualized.

Maternal uterus/adnexae: Left ovary normal in appearance. 2.2 x
x 1.5 cm complex cystic lesion present within the right ovary,
indeterminate, but suspected to reflect a small corpus luteal cyst.
No free fluid within the pelvis.
IMPRESSION: 1. Single viable intrauterine pregnancy as above, estimated
gestational age 12 weeks and 5 days by crown-rump length, with
ultrasound EDC of 06/02/2019. No complication.
2. 2.2 cm complex right ovarian cyst, indeterminate, but likely a
small degenerating corpus luteal cyst. Finding could contribute to
right lower quadrant pain.
3. No other acute maternal uterine or adnexal abnormality
identified.

## 2020-08-17 IMAGING — MR MR ABDOMEN W/O CM
6 of 8 series · 31 of 48 positions shown · non-contrast
Comparison: 08/20/2017 CT abdomen/pelvis. 11/23/2018 obstetric
scan.

CLINICAL DATA: 33-year-old pregnant female in the first trimester
with right lower quadrant pain.

EXAM:
MRI ABDOMEN AND PELVIS WITHOUT CONTRAST
TECHNIQUE: Multiplanar multisequence MR imaging of the abdomen and pelvis was
performed. No intravenous contrast was administered.

[Series 4: cor haste · coronal · 5.0mm · 1.25mm/px · 5 of 40 slices shown]
[im 1/40]
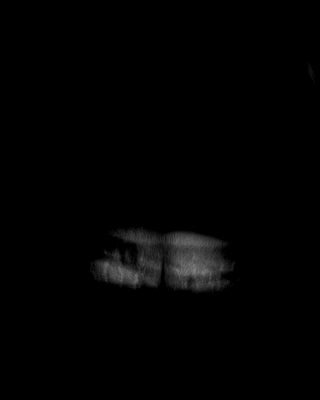
[im 10/40]
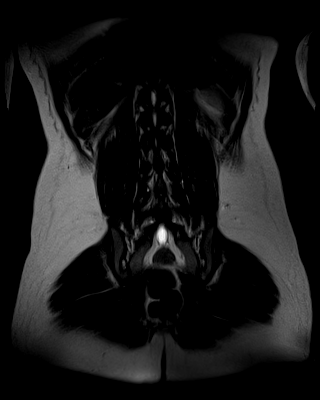
[im 20/40]
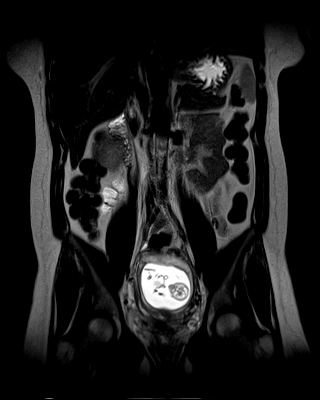
[im 30/40]
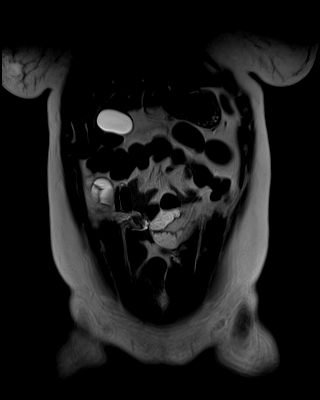
[im 40/40]
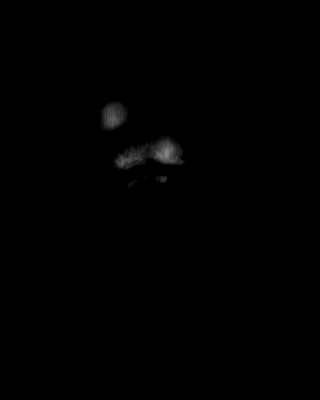

[Series 5: cor haste fs · coronal · 5.0mm · 1.25mm/px · 4 of 39 slices shown]
[im 1/39]
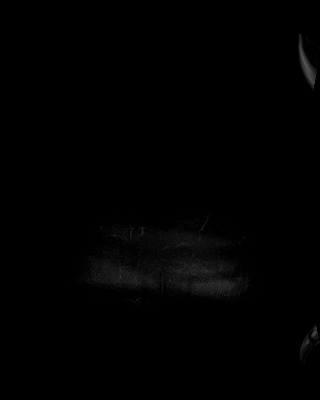
[im 13/39]
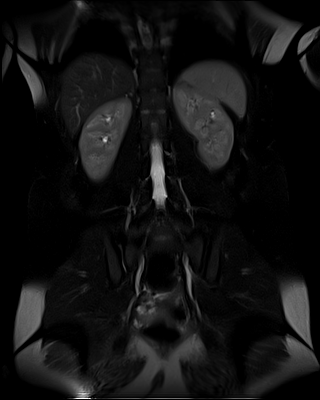
[im 26/39]
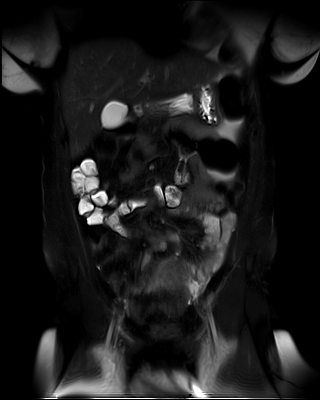
[im 39/39]
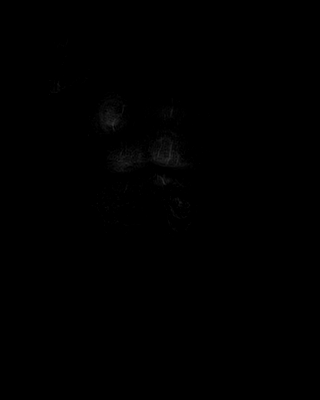

[Series 6: bSSFP · coronal · 5.0mm · 0.62mm/px · 4 of 40 slices shown (1 of 2)]
[im 1/40]
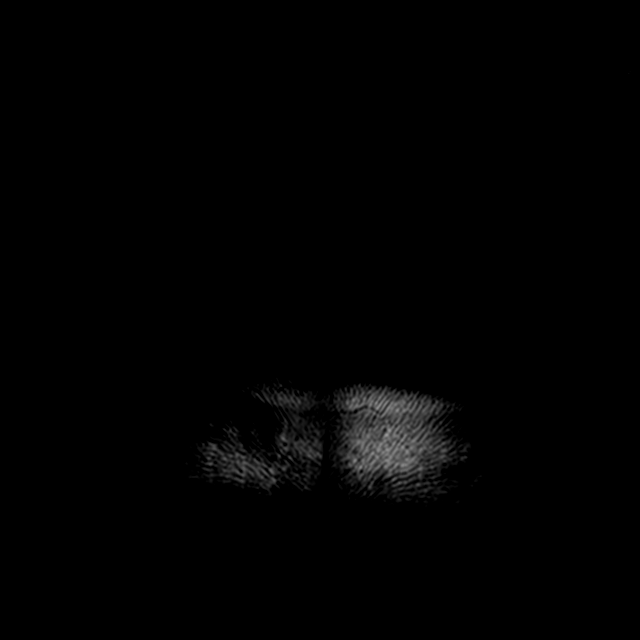
[im 14/40]
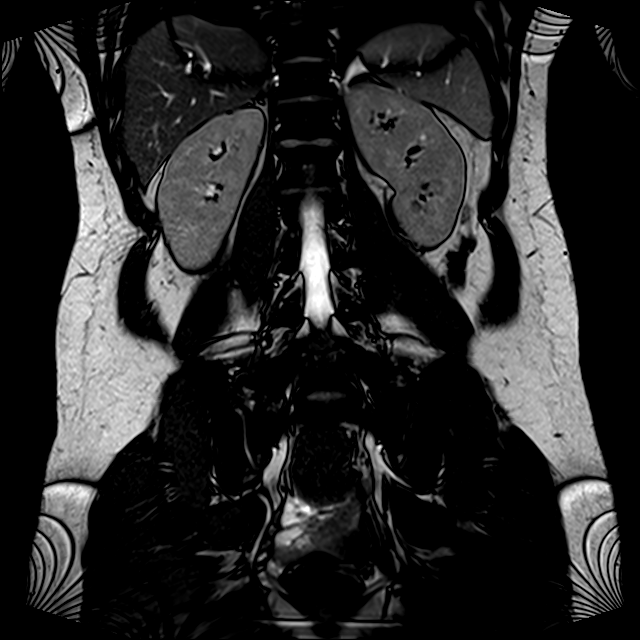
[im 27/40]
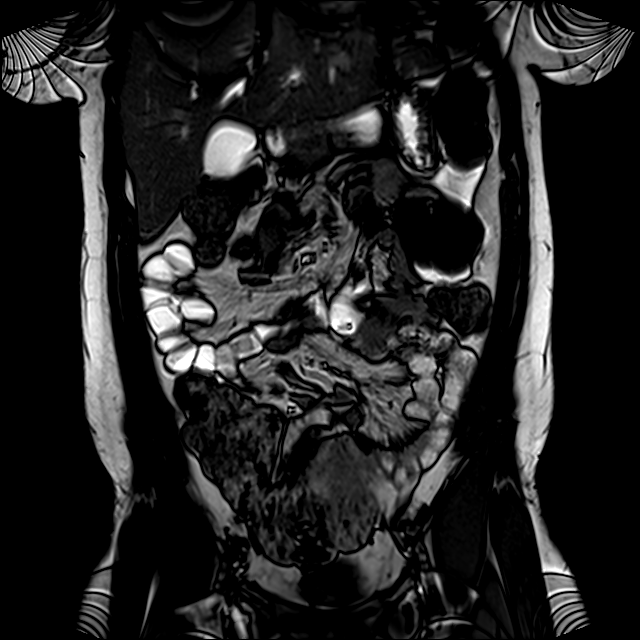
[im 40/40]
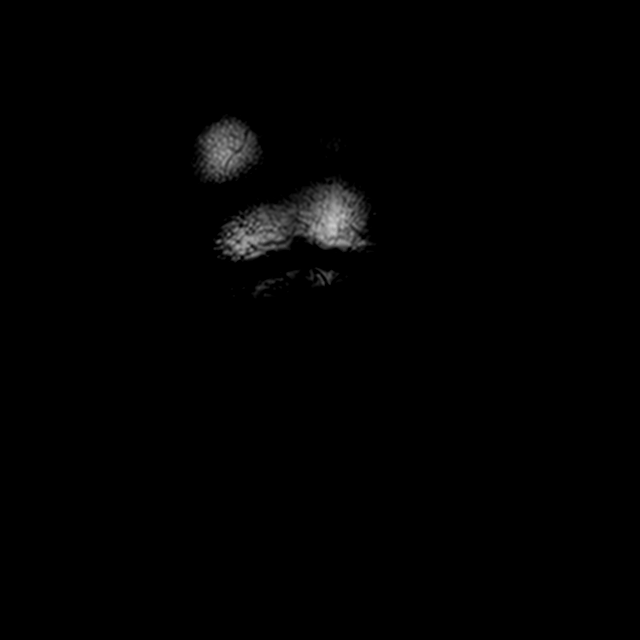

[Series 9: axial haste_comp · axial · 5.0mm · 1.19mm/px · z∈[-201,+273]mm · 7 of 80 slices shown]
[im 1/80]
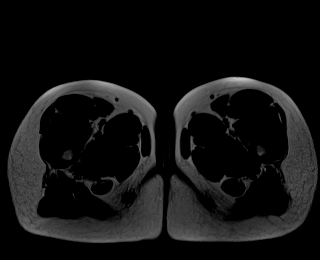
[im 14/80]
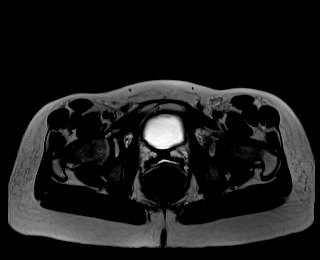
[im 27/80]
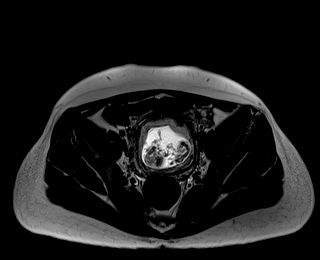
[im 40/80]
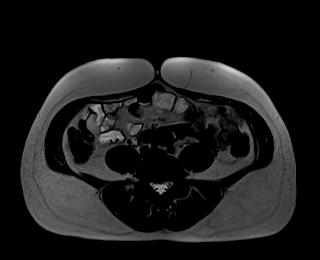
[im 53/80]
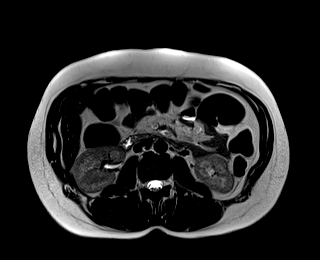
[im 66/80]
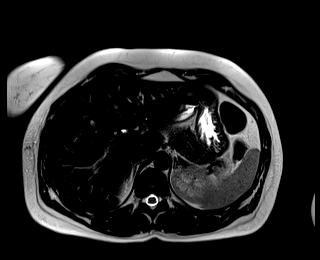
[im 80/80]
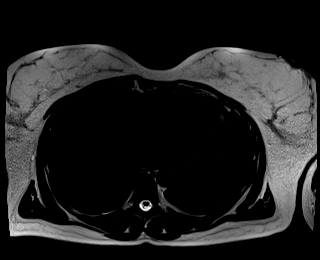

[Series 12: axial haste fs_comp · axial · 5.0mm · 1.19mm/px · z∈[-201,+273]mm · 7 of 80 slices shown]
[im 1/80]
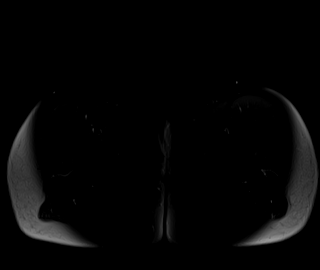
[im 14/80]
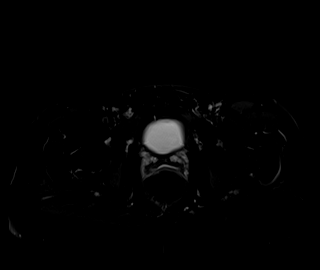
[im 27/80]
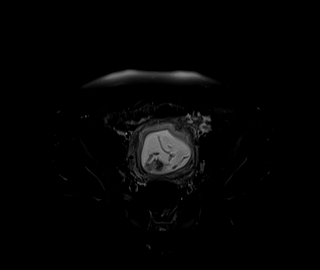
[im 40/80]
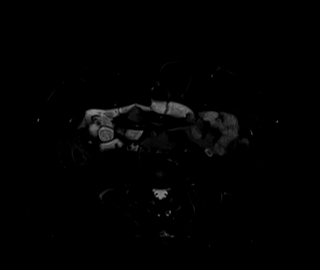
[im 53/80]
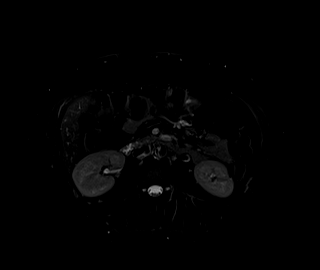
[im 66/80]
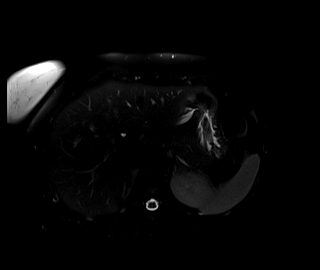
[im 80/80]
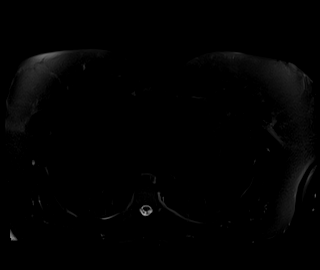

[Series 15: bSSFP · axial · 5.0mm · 0.68mm/px · z∈[-201,+33]mm · 4 of 80 slices shown (2 of 2)]
[im 1/80]
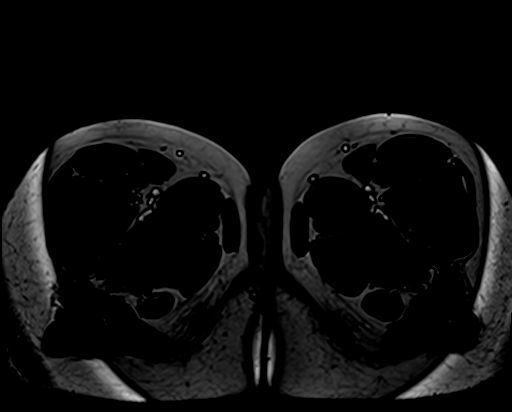
[im 14/80]
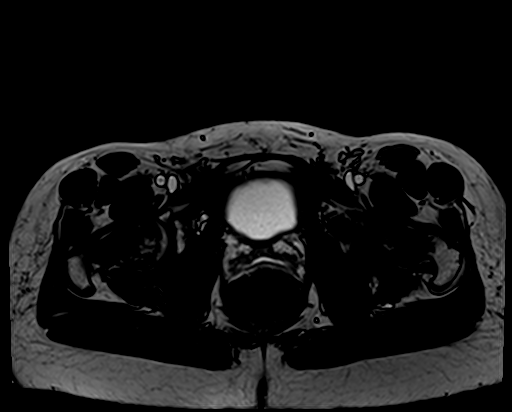
[im 27/80]
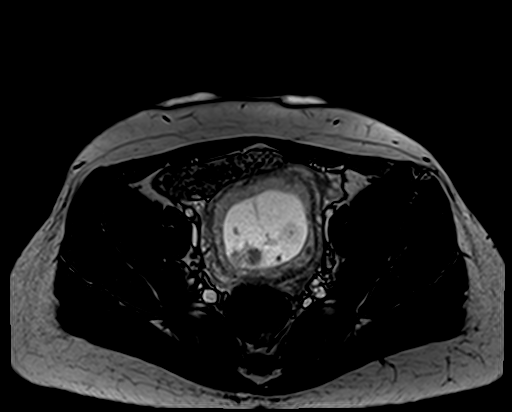
[im 40/80]
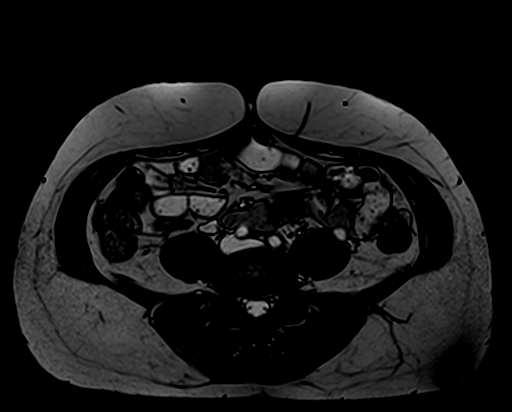

[31 of 48 positions shown; findings below may reference images not displayed]

FINDINGS: COMBINED FINDINGS FOR BOTH MR ABDOMEN AND PELVIS

Lower chest: No acute abnormality at the lung bases.

Hepatobiliary: Normal liver size and configuration. No liver mass.
Normal gallbladder with no cholelithiasis. No biliary ductal
dilatation. Common bile duct diameter 5 mm. No evidence of
choledocholithiasis.

Pancreas: No pancreatic mass or duct dilation.  No pancreas divisum.

Spleen: Normal size. No mass.

Adrenals/Urinary Tract: Normal adrenals. No hydronephrosis. Normal
kidneys with no renal mass. Normal nondistended bladder. Normal
urethra.

Stomach/Bowel: Normal non-distended stomach. Normal caliber small
and large bowel with no bowel wall thickening. Normal appendix
(series 9/image 47).

Vascular/Lymphatic: Normal caliber abdominal aorta. No
pathologically enlarged lymph nodes in the abdomen.

Reproductive: Enlarged gravid uterus with single intrauterine
gestation. The scan is not tailored for fetal anatomic evaluation.
Amniotic fluid volume appears subjectively normal. No uterine
fibroids. Small right ovarian 1.3 cm corpus luteal cyst (series
9/image 59). Normal left ovary. No adnexal masses.

Other: No ascites or focal fluid collection.

Musculoskeletal: No aggressive appearing focal osseous lesions.
IMPRESSION: No acute abnormality.  No evidence of acute appendicitis.

Enlarged gravid uterus with single intrauterine gestation.

Small 1.3 cm right ovarian corpus luteal cyst.  No adnexal masses.

## 2020-08-19 NOTE — Telephone Encounter (Signed)
Patient needs appointment to be prescribed medicine.

## 2020-09-03 ENCOUNTER — Ambulatory Visit: Payer: Medicaid Other

## 2020-09-04 ENCOUNTER — Ambulatory Visit: Payer: Medicaid Other

## 2020-09-06 ENCOUNTER — Ambulatory Visit: Payer: Medicaid Other

## 2020-09-26 ENCOUNTER — Ambulatory Visit: Payer: Medicaid Other

## 2020-09-27 ENCOUNTER — Ambulatory Visit: Payer: Medicaid Other | Admitting: Physician Assistant

## 2020-09-27 ENCOUNTER — Other Ambulatory Visit: Payer: Self-pay

## 2020-09-27 DIAGNOSIS — B9689 Other specified bacterial agents as the cause of diseases classified elsewhere: Secondary | ICD-10-CM

## 2020-09-27 DIAGNOSIS — Z113 Encounter for screening for infections with a predominantly sexual mode of transmission: Secondary | ICD-10-CM | POA: Diagnosis not present

## 2020-09-27 LAB — WET PREP FOR TRICH, YEAST, CLUE
Trichomonas Exam: NEGATIVE
Yeast Exam: NEGATIVE

## 2020-09-27 MED ORDER — METRONIDAZOLE 500 MG PO TABS: 500.0000 mg | ORAL_TABLET | Freq: Two times a day (BID) | ORAL | 0 refills | Status: AC

## 2020-09-27 NOTE — Progress Notes (Signed)
Wet mount reviewed by provider, tx per standing orders. Providers orders completed.

## 2020-09-28 ENCOUNTER — Encounter: Payer: Self-pay | Admitting: Physician Assistant
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
## 2020-10-28 ENCOUNTER — Other Ambulatory Visit: Payer: Self-pay

## 2020-10-28 ENCOUNTER — Ambulatory Visit: Payer: Medicaid Other | Admitting: Physician Assistant

## 2020-10-28 DIAGNOSIS — B3731 Acute candidiasis of vulva and vagina: Secondary | ICD-10-CM

## 2020-10-28 DIAGNOSIS — Z0389 Encounter for observation for other suspected diseases and conditions ruled out: Secondary | ICD-10-CM | POA: Diagnosis not present

## 2020-10-28 DIAGNOSIS — Z113 Encounter for screening for infections with a predominantly sexual mode of transmission: Secondary | ICD-10-CM

## 2020-10-28 LAB — WET PREP FOR TRICH, YEAST, CLUE
Trichomonas Exam: NEGATIVE
Yeast Exam: NEGATIVE

## 2020-10-28 MED ORDER — CLOTRIMAZOLE 1 % VA CREA: 1.0000 | TOPICAL_CREAM | Freq: Every day | VAGINAL | 0 refills | Status: DC

## 2020-10-28 NOTE — Progress Notes (Signed)
Wet mount reviewed.Vaginal cream given. Condoms declined. No further questions or concerns during visit. Providers orders completed.

## 2020-10-29 ENCOUNTER — Encounter: Payer: Self-pay | Admitting: Physician Assistant
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
## 2020-12-24 ENCOUNTER — Ambulatory Visit: Payer: Medicaid Other

## 2020-12-31 ENCOUNTER — Ambulatory Visit: Payer: Medicaid Other

## 2021-01-07 ENCOUNTER — Other Ambulatory Visit: Payer: Self-pay

## 2021-01-07 ENCOUNTER — Ambulatory Visit: Payer: Medicaid Other | Admitting: Family Medicine

## 2021-01-07 ENCOUNTER — Encounter: Payer: Self-pay | Admitting: Family Medicine

## 2021-01-07 DIAGNOSIS — B9689 Other specified bacterial agents as the cause of diseases classified elsewhere: Secondary | ICD-10-CM

## 2021-01-07 DIAGNOSIS — Z113 Encounter for screening for infections with a predominantly sexual mode of transmission: Secondary | ICD-10-CM

## 2021-01-07 DIAGNOSIS — Z0389 Encounter for observation for other suspected diseases and conditions ruled out: Secondary | ICD-10-CM | POA: Diagnosis not present

## 2021-01-07 DIAGNOSIS — B3731 Acute candidiasis of vulva and vagina: Secondary | ICD-10-CM

## 2021-01-07 LAB — WET PREP FOR TRICH, YEAST, CLUE
Trichomonas Exam: NEGATIVE
Yeast Exam: NEGATIVE

## 2021-01-07 MED ORDER — CLOTRIMAZOLE 1 % VA CREA: 1.0000 | TOPICAL_CREAM | Freq: Every day | VAGINAL | 0 refills | Status: AC

## 2021-01-07 MED ORDER — METRONIDAZOLE 500 MG PO TABS: 500.0000 mg | ORAL_TABLET | Freq: Two times a day (BID) | ORAL | 0 refills | Status: AC
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
## 2021-01-07 NOTE — Progress Notes (Signed)
Here for STD screening.Jacquline Terrill Brewer-Jensen, RN 

## 2021-01-15 ENCOUNTER — Ambulatory Visit: Payer: Medicaid Other

## 2021-01-28 ENCOUNTER — Encounter: Payer: Self-pay | Admitting: Physician Assistant

## 2021-02-04 ENCOUNTER — Other Ambulatory Visit: Payer: Self-pay | Admitting: Family Medicine

## 2021-02-04 DIAGNOSIS — N760 Acute vaginitis: Secondary | ICD-10-CM

## 2021-02-04 DIAGNOSIS — B9689 Other specified bacterial agents as the cause of diseases classified elsewhere: Secondary | ICD-10-CM

## 2021-02-04 MED ORDER — METRONIDAZOLE 500 MG PO TABS: 500.0000 mg | ORAL_TABLET | Freq: Two times a day (BID) | ORAL | 0 refills | Status: AC

## 2021-02-04 NOTE — Progress Notes (Signed)
Pt sent message that she still has s/sx after drinking ETOH while taking metronidazole.    Rx sent to pharmacy listed   Wendi Snipes, FNP

## 2021-02-20 ENCOUNTER — Encounter: Payer: Self-pay | Admitting: Advanced Practice Midwife

## 2021-02-20 ENCOUNTER — Ambulatory Visit: Payer: Medicaid Other | Admitting: Advanced Practice Midwife

## 2021-02-20 ENCOUNTER — Other Ambulatory Visit: Payer: Self-pay

## 2021-02-20 DIAGNOSIS — Z113 Encounter for screening for infections with a predominantly sexual mode of transmission: Secondary | ICD-10-CM | POA: Diagnosis not present

## 2021-02-20 DIAGNOSIS — Z91199 Patient's noncompliance with other medical treatment and regimen due to unspecified reason: Secondary | ICD-10-CM | POA: Insufficient documentation

## 2021-02-20 DIAGNOSIS — Z0389 Encounter for observation for other suspected diseases and conditions ruled out: Secondary | ICD-10-CM | POA: Diagnosis not present

## 2021-02-20 DIAGNOSIS — B9689 Other specified bacterial agents as the cause of diseases classified elsewhere: Secondary | ICD-10-CM

## 2021-02-20 LAB — WET PREP FOR TRICH, YEAST, CLUE
Trichomonas Exam: NEGATIVE
Yeast Exam: NEGATIVE

## 2021-02-20 MED ORDER — METRONIDAZOLE 500 MG PO TABS: 500.0000 mg | ORAL_TABLET | Freq: Two times a day (BID) | ORAL | 0 refills | Status: AC
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
## 2021-02-20 NOTE — Progress Notes (Signed)
Pt here for STD screening.  Wet mount results reviewed.  Medication dispensed per Provider orders. Pt declined condoms. Madina Galati M Tibor Lemmons, RN  

## 2021-03-19 ENCOUNTER — Telehealth: Payer: Medicaid Other | Admitting: Physician Assistant

## 2021-03-19 ENCOUNTER — Telehealth: Payer: Medicaid Other

## 2021-03-19 DIAGNOSIS — B9689 Other specified bacterial agents as the cause of diseases classified elsewhere: Secondary | ICD-10-CM

## 2021-03-19 DIAGNOSIS — B379 Candidiasis, unspecified: Secondary | ICD-10-CM | POA: Diagnosis not present

## 2021-03-19 DIAGNOSIS — N760 Acute vaginitis: Secondary | ICD-10-CM | POA: Diagnosis not present

## 2021-03-19 MED ORDER — FLUCONAZOLE 150 MG PO TABS: 150.0000 mg | ORAL_TABLET | Freq: Once | ORAL | 0 refills | Status: AC

## 2021-03-19 MED ORDER — METRONIDAZOLE 0.75 % VA GEL: 1.0000 | Freq: Two times a day (BID) | VAGINAL | 0 refills | Status: DC

## 2021-03-19 NOTE — Patient Instructions (Signed)
[name], thank you for joining Diamond Loveless, PA-C for today's virtual visit.  While this provider is not your primary care provider (PCP), if your PCP is located in our provider database this encounter information will be shared with them immediately following your visit.  Consent: (Patient) Diamond Sutton provided verbal consent for this virtual visit at the beginning of the encounter.  Current Medications:  Current Outpatient Medications:    fluconazole (DIFLUCAN) 150 MG tablet, Take 1 tablet (150 mg total) by mouth once for 1 dose., Disp: 1 tablet, Rfl: 0   metroNIDAZOLE (METROGEL) 0.75 % vaginal gel, Place 1 Applicatorful vaginally 2 (two) times daily., Disp: 70 g, Rfl: 0   glucose blood test strip, Test blood sugar 4 times per day and PRN. (Patient not taking: Reported on 01/16/2020), Disp: 100 each, Rfl: 12   medroxyPROGESTERone (DEPO-PROVERA) 150 MG/ML injection, Inject 1 mL (150 mg total) into the muscle once for 1 dose., Disp: 1 mL, Rfl: 3   NIFEdipine (ADALAT CC) 30 MG 24 hr tablet, TAKE 1 TABLET(30 MG) BY MOUTH DAILY, Disp: 30 tablet, Rfl: 1   Prenatal Vit-Fe Fumarate-FA (PRENATAL VITAMIN) 27-0.8 MG TABS, Take 1 tablet by mouth daily at 6 (six) AM., Disp: 100 tablet, Rfl: 0   traMADol (ULTRAM) 50 MG tablet, Take 1 tablet (50 mg total) by mouth every 12 (twelve) hours as needed. (Patient not taking: Reported on 01/16/2020), Disp: 10 tablet, Rfl: 0   Medications ordered in this encounter:  Meds ordered this encounter  Medications   metroNIDAZOLE (METROGEL) 0.75 % vaginal gel    Sig: Place 1 Applicatorful vaginally 2 (two) times daily.    Dispense:  70 g    Refill:  0    Order Specific Question:   Supervising Provider    Answer:   MILLER, BRIAN [[id number]]   fluconazole (DIFLUCAN) 150 MG tablet    Sig: Take 1 tablet (150 mg total) by mouth once for 1 dose.    Dispense:  1 tablet    Refill:  0    Order Specific Question:   Supervising Provider    Answer:    Hyacinth Meeker, BRIAN [[id number]]     *If you need refills on other medications prior to your next appointment, please contact your pharmacy*  Follow-Up: Call back or seek an in-person evaluation if the symptoms worsen or if the condition fails to improve as anticipated.  Other Instructions Bacterial Vaginosis Bacterial vaginosis is an infection of the vagina. It happens when too many normal germs (healthy bacteria) grow in the vagina. This infection can make it easier to get other infections from sex (STIs). It is very important for pregnant women to get treated. This infection can cause babies to be born early or at a low birth weight. What are the causes? This infection is caused by an increase in certain germs that grow in the vagina. You cannot get this infection from toilet seats, bedsheets, swimming pools, or things that touch your vagina. What increases the risk? Having sex with a new person or more than one person. Having sex without protection. Douching. Having an intrauterine device (IUD). Smoking. Using drugs or drinking alcohol. These can lead you to do things that are risky. Taking certain antibiotic medicines. Being pregnant. What are the signs or symptoms? Some women have no symptoms. Symptoms may include: A discharge from your vagina. It may be gray or white. It can be watery or foamy. A fishy smell. This can happen after sex or  during your menstrual period. Itching in and around your vagina. A feeling of burning or pain when you pee (urinate). How is this treated? This infection is treated with antibiotic medicines. These may be given to you as: A pill. A cream for your vagina. A medicine that you put into your vagina (suppository). If the infection comes back after treatment, you may need more antibiotics. Follow these instructions at home: Medicines Take over-the-counter and prescription medicines as told by your doctor. Take or use your antibiotic medicine as told by  your doctor. Do not stop taking or using it, even if you start to feel better. General instructions If the person you have sex with is a woman, tell her that you have this infection. She will need to follow up with her doctor. If you have a female partner, he does not need to be treated. Do not have sex until you finish treatment. Drink enough fluid to keep your pee pale yellow. Keep your vagina and butt clean. Wash the area with warm water each day. Wipe from front to back after you use the toilet. If you are breastfeeding a baby, ask your doctor if you should keep doing so during treatment. Keep all follow-up visits. How is this prevented? Self-care Do not douche. Use only warm water to wash around your vagina. Wear underwear that is cotton or lined with cotton. Do not wear tight pants and pantyhose, especially in the summer. Safe sex Use protection when you have sex. This includes: Use condoms. Use dental dams. This is a thin layer that protects the mouth during oral sex. Limit how many people you have sex with. To prevent this infection, it is best to have sex with just one person. Get tested for STIs. The person you have sex with should also get tested. Drugs and alcohol Do not smoke or use any products that contain nicotine or tobacco. If you need help quitting, ask your doctor. Do not use drugs. Do not drink alcohol if: Your doctor tells you not to drink. You are pregnant, may be pregnant, or are planning to become pregnant. If you drink alcohol: Limit how much you have to 0-1 drink a day. Know how much alcohol is in your drink. In the [place] one drink equals one 12 oz bottle of beer (355 mL), one 5 oz glass of wine (148 mL), or one 1 oz glass of hard liquor (44 mL). Where to find more information Centers for Disease Control and Prevention: FootballExhibition.com.br American Sexual Health Association: www.ashastd.org Office on [employer] Health: http://hoffman.com/ Contact a doctor if: Your  symptoms do not get better, even after you are treated. You have more discharge or pain when you pee. You have a fever or chills. You have pain in your belly (abdomen) or in the area between your hips. You have pain with sex. You bleed from your vagina between menstrual periods. Summary This infection can happen when too many germs (bacteria) grow in the vagina. This infection can make it easier to get infections from sex (STIs). Treating this can lower that chance. Get treated if you are pregnant. This infection can cause babies to be born early. Do not stop taking or using your antibiotic medicine, even if you start to feel better. This information is not intended to replace advice given to you by your health care provider. Make sure you discuss any questions you have with your health care provider. Document Revised: 07/06/2019 Document Reviewed: 07/06/2019 Elsevier Patient Education  2022 Elsevier  Inc.  Vaginal Yeast Infection, Adult Vaginal yeast infection is a condition that causes vaginal discharge as well as soreness, swelling, and redness (inflammation) of the vagina. This is a common condition. Some women get this infection frequently. What are the causes? This condition is caused by a change in the normal balance of the yeast (Candida) and normal bacteria that live in the vagina. This change causes an overgrowth of yeast, which causes the inflammation. What increases the risk? The condition is more likely to develop in women who: Take antibiotic medicines. Have diabetes. Take birth control pills. Are pregnant. Douche often. Have a weak body defense system (immune system). Have been taking steroid medicines for a long time. Frequently wear tight clothing. What are the signs or symptoms? Symptoms of this condition include: White, thick, creamy vaginal discharge. Swelling, itching, redness, and irritation of the vagina. The lips of the vagina (labia) may be affected as  well. Pain or a burning feeling while urinating. Pain during sex. How is this diagnosed? This condition is diagnosed based on: Your medical history. A physical exam. A pelvic exam. Your health care provider will examine a sample of your vaginal discharge under a microscope. Your health care provider may send this sample for testing to confirm the diagnosis. How is this treated? This condition is treated with medicine. Medicines may be over-the-counter or prescription. You may be told to use one or more of the following: Medicine that is taken by mouth (orally). Medicine that is applied as a cream (topically). Medicine that is inserted directly into the vagina (suppository). Follow these instructions at home: Take or apply over-the-counter and prescription medicines only as told by your health care provider. Do not use tampons until your health care provider approves. Do not have sex until your infection has cleared. Sex can prolong or worsen your symptoms of infection. Ask your health care provider when it is safe to resume sexual activity. Keep all follow-up visits. This is important. How is this prevented?  Do not wear tight clothes, such as pantyhose or tight pants. Wear breathable cotton underwear. Do not use douches, perfumed soap, creams, or powders. Wipe from front to back after using the toilet. If you have diabetes, keep your blood sugar levels under control. Ask your health care provider for other ways to prevent yeast infections. Contact a health care provider if: You have a fever. Your symptoms go away and then return. Your symptoms do not get better with treatment. Your symptoms get worse. You have new symptoms. You develop blisters in or around your vagina. You have blood coming from your vagina and it is not your menstrual period. You develop pain in your abdomen. Summary Vaginal yeast infection is a condition that causes discharge as well as soreness, swelling, and  redness (inflammation) of the vagina. This condition is treated with medicine. Medicines may be over-the-counter or prescription. Take or apply over-the-counter and prescription medicines only as told by your health care provider. Do not douche. Resume sexual activity or use of tampons as instructed by your health care provider. Contact a health care provider if your symptoms do not get better with treatment or your symptoms go away and then return. This information is not intended to replace advice given to you by your health care provider. Make sure you discuss any questions you have with your health care provider. Document Revised: 03/25/2020 Document Reviewed: 03/25/2020 Elsevier Patient Education  2022 [employer].    If you have been instructed to have  an in-person evaluation today at a local Urgent Care facility, please use the link below. It will take you to a list of all of our available [facility], including address, phone number and hours of operation. Please do not delay care.  [facility]  If you or a family member do not have a primary care provider, use the link below to schedule a visit and establish care. When you choose a [facility] primary care physician or advanced practice provider, you gain a long-term partner in health. Find a Primary Care Provider  Learn more about [facility]'s in-office and virtual care options: [facility]
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
## 2021-03-28 ENCOUNTER — Other Ambulatory Visit: Payer: Medicaid Other

## 2021-04-01 ENCOUNTER — Encounter: Payer: Medicaid Other | Admitting: Certified Nurse Midwife

## 2021-04-08 ENCOUNTER — Encounter: Payer: Medicaid Other | Admitting: Certified Nurse Midwife

## 2021-04-12 ENCOUNTER — Telehealth: Payer: Medicaid Other | Admitting: Family Medicine

## 2021-04-12 DIAGNOSIS — B3731 Acute candidiasis of vulva and vagina: Secondary | ICD-10-CM | POA: Diagnosis not present

## 2021-04-12 MED ORDER — FLUCONAZOLE 150 MG PO TABS: 150.0000 mg | ORAL_TABLET | Freq: Once | ORAL | 0 refills | Status: AC

## 2021-04-12 NOTE — Progress Notes (Signed)

## 2021-04-13 ENCOUNTER — Other Ambulatory Visit: Payer: Self-pay

## 2021-04-13 ENCOUNTER — Ambulatory Visit
Admission: RE | Admit: 2021-04-13 | Discharge: 2021-04-13 | Disposition: A | Discharge status: Home / Self Care | Payer: Medicaid Other | Source: Ambulatory Visit | Attending: Emergency Medicine | Admitting: Emergency Medicine

## 2021-04-13 VITALS — BP 152/86 | HR 82 | Temp 98.6°F | Resp 18

## 2021-04-13 DIAGNOSIS — N898 Other specified noninflammatory disorders of vagina: Secondary | ICD-10-CM | POA: Diagnosis not present

## 2021-04-13 DIAGNOSIS — Z113 Encounter for screening for infections with a predominantly sexual mode of transmission: Secondary | ICD-10-CM | POA: Insufficient documentation

## 2021-04-13 NOTE — Discharge Instructions (Addendum)
Your vaginal tests are pending.  If your test results are positive, we will call you.  You and your sexual partner(s) may require treatment at that time.  Do not have sexual activity for at least 7 days.     

## 2021-04-13 NOTE — ED Triage Notes (Signed)
Pt here to be tested for STDs and possible yeast infection. ?
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
## 2021-04-14 ENCOUNTER — Telehealth (HOSPITAL_COMMUNITY): Payer: Self-pay | Admitting: Emergency Medicine

## 2021-04-14 LAB — CERVICOVAGINAL ANCILLARY ONLY
Bacterial Vaginitis (gardnerella): POSITIVE — AB
Candida Glabrata: NEGATIVE
Candida Vaginitis: NEGATIVE
Chlamydia: NEGATIVE
Comment: NEGATIVE
Comment: NEGATIVE
Comment: NEGATIVE
Comment: NEGATIVE
Comment: NEGATIVE
Comment: NORMAL
Neisseria Gonorrhea: NEGATIVE
Trichomonas: NEGATIVE

## 2021-04-14 MED ORDER — CLINDAMYCIN HCL 150 MG PO CAPS: 300.0000 mg | ORAL_CAPSULE | Freq: Two times a day (BID) | ORAL | 0 refills | Status: AC

## 2021-04-26 ENCOUNTER — Telehealth: Payer: Medicaid Other | Admitting: Nurse Practitioner

## 2021-04-26 DIAGNOSIS — B9689 Other specified bacterial agents as the cause of diseases classified elsewhere: Secondary | ICD-10-CM | POA: Diagnosis not present

## 2021-04-26 DIAGNOSIS — N760 Acute vaginitis: Secondary | ICD-10-CM | POA: Diagnosis not present

## 2021-04-26 MED ORDER — METRONIDAZOLE 0.75 % VA GEL: 1.0000 | Freq: Two times a day (BID) | VAGINAL | 0 refills | Status: DC
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
## 2021-04-26 NOTE — Patient Instructions (Signed)
?  Diamond Sutton, thank you for joining Claiborne Rigg, NP for today's virtual visit.  While this provider is not your primary care provider (PCP), if your PCP is located in our provider database this encounter information will be shared with them immediately following your visit. ? ?Consent: ?(Patient) DELANO SCARDINO provided verbal consent for this virtual visit at the beginning of the encounter. ? ?Current Medications: ? ?Current Outpatient Medications:  ?  glucose blood test strip, Test blood sugar 4 times per day and PRN. (Patient not taking: Reported on 01/16/2020), Disp: 100 each, Rfl: 12 ?  medroxyPROGESTERone (DEPO-PROVERA) 150 MG/ML injection, Inject 1 mL (150 mg total) into the muscle once for 1 dose., Disp: 1 mL, Rfl: 3 ?  metroNIDAZOLE (METROGEL) 0.75 % vaginal gel, Place 1 Applicatorful vaginally 2 (two) times daily., Disp: 70 g, Rfl: 0 ?  NIFEdipine (ADALAT CC) 30 MG 24 hr tablet, TAKE 1 TABLET(30 MG) BY MOUTH DAILY, Disp: 30 tablet, Rfl: 1 ?  Prenatal Vit-Fe Fumarate-FA (PRENATAL VITAMIN) 27-0.8 MG TABS, Take 1 tablet by mouth daily at 6 (six) AM., Disp: 100 tablet, Rfl: 0 ?  traMADol (ULTRAM) 50 MG tablet, Take 1 tablet (50 mg total) by mouth every 12 (twelve) hours as needed. (Patient not taking: Reported on 01/16/2020), Disp: 10 tablet, Rfl: 0  ? ?Medications ordered in this encounter:  ?Meds ordered this encounter  ?Medications  ? metroNIDAZOLE (METROGEL) 0.75 % vaginal gel  ?  Sig: Place 1 Applicatorful vaginally 2 (two) times daily.  ?  Dispense:  70 g  ?  Refill:  0  ?  Order Specific Question:   Supervising Provider  ?  Answer:   Eber Hong [[id number]]  ?  ? ?*If you need refills on other medications prior to your next appointment, please contact your pharmacy* ? ?Follow-Up: ?Call back or seek an in-person evaluation if the symptoms worsen or if the condition fails to improve as anticipated. ? ?Other Instructions ?Take medication as prescribed. Complete course  ? ? ?If you have been  instructed to have an in-person evaluation today at a local Urgent Care facility, please use the link below. It will take you to a list of all of our available [facility], including address, phone number and hours of operation. Please do not delay care.  ?[facility] ? ?If you or a family member do not have a primary care provider, use the link below to schedule a visit and establish care. When you choose a [facility] primary care physician or advanced practice provider, you gain a long-term partner in health. ?Find a Primary Care Provider ? ?Learn more about [facility]'s in-office and virtual care options: ?[facility] - Get Care Now  ?

## 2021-05-03 ENCOUNTER — Ambulatory Visit: Payer: Medicaid Other

## 2021-05-09 ENCOUNTER — Telehealth: Payer: Medicaid Other | Admitting: Physician Assistant

## 2021-05-09 ENCOUNTER — Encounter: Payer: Self-pay | Admitting: Nurse Practitioner

## 2021-05-09 DIAGNOSIS — N760 Acute vaginitis: Secondary | ICD-10-CM | POA: Diagnosis not present

## 2021-05-09 MED ORDER — CLINDAMYCIN HCL 300 MG PO CAPS: 300.0000 mg | ORAL_CAPSULE | Freq: Two times a day (BID) | ORAL | 0 refills | Status: AC

## 2021-05-09 NOTE — Patient Instructions (Signed)
?  [name], thank you for joining Piedad Climes, PA-C for today's virtual visit.  While this provider is not your primary care provider (PCP), if your PCP is located in our provider database this encounter information will be shared with them immediately following your visit. ? ?Consent: ?(Patient) Diamond Sutton provided verbal consent for this virtual visit at the beginning of the encounter. ? ?Current Medications: ? ?Current Outpatient Medications:  ?  clindamycin (CLEOCIN) 300 MG capsule, Take 1 capsule (300 mg total) by mouth in the morning and at bedtime for 7 days., Disp: 14 capsule, Rfl: 0 ?  NIFEdipine (ADALAT CC) 30 MG 24 hr tablet, TAKE 1 TABLET(30 MG) BY MOUTH DAILY, Disp: 30 tablet, Rfl: 1  ? ?Medications ordered in this encounter:  ?Meds ordered this encounter  ?Medications  ? clindamycin (CLEOCIN) 300 MG capsule  ?  Sig: Take 1 capsule (300 mg total) by mouth in the morning and at bedtime for 7 days.  ?  Dispense:  14 capsule  ?  Refill:  0  ?  Order Specific Question:   Supervising Provider  ?  Answer:   Eber Hong [[id number]]  ?  ? ?*If you need refills on other medications prior to your next appointment, please contact your pharmacy* ? ?Follow-Up: ?Call back or seek an in-person evaluation if the symptoms worsen or if the condition fails to improve as anticipated. ? ?Other Instructions ?Please take the medication as directed. ?Start a daily probiotic. ?You need to schedule a follow-up with your regular provider to talk about these ongoing issues. They may want to consider boric acid suppositories to help prevent frequent flares but will be at their discretion. ? ?If current episode if not resolving with what you were given, you will have to be re-evaluated in person.  ? ? ?If you have been instructed to have an in-person evaluation today at a local Urgent Care facility, please use the link below. It will take you to a list of all of our available [facility],  including address, phone number and hours of operation. Please do not delay care.  ?[facility] ? ?If you or a family member do not have a primary care provider, use the link below to schedule a visit and establish care. When you choose a [facility] primary care physician or advanced practice provider, you gain a long-term partner in health. ?Find a Primary Care Provider ? ?Learn more about [facility]'s in-office and virtual care options: ?[place] [facility] - Get Care Now  ?
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
## 2021-05-11 ENCOUNTER — Ambulatory Visit
Admission: RE | Admit: 2021-05-11 | Discharge: 2021-05-11 | Disposition: A | Discharge status: Home / Self Care | Payer: Medicaid Other | Source: Ambulatory Visit | Attending: Emergency Medicine | Admitting: Emergency Medicine

## 2021-05-11 VITALS — BP 146/96 | HR 76 | Temp 97.9°F | Resp 18

## 2021-05-11 DIAGNOSIS — Z113 Encounter for screening for infections with a predominantly sexual mode of transmission: Secondary | ICD-10-CM | POA: Diagnosis not present

## 2021-05-11 DIAGNOSIS — N898 Other specified noninflammatory disorders of vagina: Secondary | ICD-10-CM | POA: Diagnosis not present

## 2021-05-11 HISTORY — DX: Other specified bacterial agents as the cause of diseases classified elsewhere: B96.89

## 2021-05-11 HISTORY — DX: Other specified bacterial agents as the cause of diseases classified elsewhere: N76.0
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
## 2021-05-11 NOTE — Discharge Instructions (Addendum)
Your vaginal tests are pending.  If your test results are positive, we will call you.  You and your sexual partner(s) may require treatment at that time.  Do not have sexual activity for at least 7 days.   ? ?Please schedule an appointment with your gynecologist for evaluation of your recurrent vaginal symptoms.   ? ? ? ? ?
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
## 2021-05-12 LAB — CERVICOVAGINAL ANCILLARY ONLY
Bacterial Vaginitis (gardnerella): NEGATIVE
Candida Glabrata: NEGATIVE
Candida Vaginitis: NEGATIVE
Chlamydia: NEGATIVE
Comment: NEGATIVE
Comment: NEGATIVE
Comment: NEGATIVE
Comment: NEGATIVE
Comment: NEGATIVE
Comment: NORMAL
Neisseria Gonorrhea: NEGATIVE
Trichomonas: NEGATIVE

## 2021-05-30 ENCOUNTER — Telehealth: Payer: Medicaid Other

## 2021-06-09 ENCOUNTER — Ambulatory Visit: Payer: Medicaid Other

## 2021-06-10 ENCOUNTER — Ambulatory Visit
Admission: RE | Admit: 2021-06-10 | Discharge: 2021-06-10 | Disposition: A | Discharge status: Home / Self Care | Payer: Medicaid Other | Source: Ambulatory Visit | Attending: Family Medicine | Admitting: Family Medicine

## 2021-06-10 VITALS — BP 139/94 | HR 80 | Temp 98.2°F | Resp 18

## 2021-06-10 DIAGNOSIS — R300 Dysuria: Secondary | ICD-10-CM | POA: Insufficient documentation

## 2021-06-10 DIAGNOSIS — N760 Acute vaginitis: Secondary | ICD-10-CM | POA: Insufficient documentation

## 2021-06-10 DIAGNOSIS — Z113 Encounter for screening for infections with a predominantly sexual mode of transmission: Secondary | ICD-10-CM | POA: Diagnosis not present

## 2021-06-10 LAB — POCT URINALYSIS DIP (MANUAL ENTRY)
Bilirubin, UA: NEGATIVE
Glucose, UA: NEGATIVE mg/dL
Ketones, POC UA: NEGATIVE mg/dL
Leukocytes, UA: NEGATIVE
Nitrite, UA: NEGATIVE
Protein Ur, POC: 30 mg/dL — AB
Spec Grav, UA: 1.025 (ref 1.010–1.025)
Urobilinogen, UA: 1 E.U./dL
pH, UA: 6 (ref 5.0–8.0)

## 2021-06-10 LAB — POCT URINE PREGNANCY: Preg Test, Ur: NEGATIVE

## 2021-06-10 MED ORDER — FLUCONAZOLE 150 MG PO TABS: 150.0000 mg | ORAL_TABLET | Freq: Every day | ORAL | 0 refills | Status: DC

## 2021-06-10 MED ORDER — METRONIDAZOLE 500 MG PO TABS: 500.0000 mg | ORAL_TABLET | Freq: Two times a day (BID) | ORAL | 0 refills | Status: DC
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
## 2021-06-10 NOTE — ED Triage Notes (Signed)
Pt presents with vaginal discharge, abdominal pain and dysuria x 1 week.

## 2021-06-10 NOTE — Discharge Instructions (Signed)
Vaginal cytology should result within 24 hours and blood work typically takes up to 48 to 72 hours to result.  Our office will contact you if any additional treatment is warranted.

## 2021-06-11 ENCOUNTER — Ambulatory Visit: Payer: Medicaid Other

## 2021-06-11 LAB — RPR: RPR Ser Ql: NONREACTIVE

## 2021-06-11 LAB — CERVICOVAGINAL ANCILLARY ONLY
Bacterial Vaginitis (gardnerella): NEGATIVE
Candida Glabrata: NEGATIVE
Candida Vaginitis: POSITIVE — AB
Chlamydia: NEGATIVE
Comment: NEGATIVE
Comment: NEGATIVE
Comment: NEGATIVE
Comment: NEGATIVE
Comment: NEGATIVE
Comment: NORMAL
Neisseria Gonorrhea: NEGATIVE
Trichomonas: NEGATIVE

## 2021-06-11 LAB — URINE CULTURE
Culture: 10000 — AB
Special Requests: NORMAL

## 2021-06-11 LAB — HIV ANTIBODY (ROUTINE TESTING W REFLEX): HIV Screen 4th Generation wRfx: NONREACTIVE

## 2021-06-25 ENCOUNTER — Telehealth: Payer: Medicaid Other

## 2021-06-25 ENCOUNTER — Telehealth: Payer: Medicaid Other | Admitting: Physician Assistant

## 2021-06-25 DIAGNOSIS — A084 Viral intestinal infection, unspecified: Secondary | ICD-10-CM

## 2021-06-25 MED ORDER — ONDANSETRON 4 MG PO TBDP: 4.0000 mg | ORAL_TABLET | Freq: Three times a day (TID) | ORAL | 0 refills | Status: DC | PRN

## 2021-06-25 NOTE — Progress Notes (Signed)
I have spent 5 minutes in review of e-visit questionnaire, review and updating patient chart, medical decision making and response to patient.   Aronda Burford Cody Ninel Abdella, PA-C    

## 2021-06-25 NOTE — Progress Notes (Signed)

## 2021-06-27 ENCOUNTER — Telehealth: Payer: Medicaid Other | Admitting: Family Medicine

## 2021-06-27 ENCOUNTER — Other Ambulatory Visit: Payer: Self-pay | Admitting: Family Medicine

## 2021-06-27 ENCOUNTER — Telehealth: Payer: Medicaid Other | Admitting: Physician Assistant

## 2021-06-27 DIAGNOSIS — N760 Acute vaginitis: Secondary | ICD-10-CM

## 2021-06-27 DIAGNOSIS — B9689 Other specified bacterial agents as the cause of diseases classified elsewhere: Secondary | ICD-10-CM

## 2021-06-27 MED ORDER — CLINDAMYCIN HCL 300 MG PO CAPS: 300.0000 mg | ORAL_CAPSULE | Freq: Two times a day (BID) | ORAL | 0 refills | Status: DC

## 2021-06-27 NOTE — Patient Instructions (Signed)
Diamond Sutton, thank you for joining [name], PA-C for today's virtual visit.  While this provider is not your primary care provider (PCP), if your PCP is located in our provider database this encounter information will be shared with them immediately following your visit.  Consent: (Patient) Diamond Sutton provided verbal consent for this virtual visit at the beginning of the encounter.  Current Medications:  Current Outpatient Medications:    clindamycin (CLEOCIN) 300 MG capsule, Take 1 capsule (300 mg total) by mouth in the morning and at bedtime., Disp: 14 capsule, Rfl: 0   fluconazole (DIFLUCAN) 150 MG tablet, Take 1 tablet (150 mg total) by mouth daily., Disp: 2 tablet, Rfl: 0   NIFEdipine (ADALAT CC) 30 MG 24 hr tablet, TAKE 1 TABLET(30 MG) BY MOUTH DAILY, Disp: 30 tablet, Rfl: 1   ondansetron (ZOFRAN-ODT) 4 MG disintegrating tablet, Take 1 tablet (4 mg total) by mouth every 8 (eight) hours as needed for nausea or vomiting., Disp: 20 tablet, Rfl: 0   Medications ordered in this encounter:  Meds ordered this encounter  Medications   clindamycin (CLEOCIN) 300 MG capsule    Sig: Take 1 capsule (300 mg total) by mouth in the morning and at bedtime.    Dispense:  14 capsule    Refill:  0    Order Specific Question:   Supervising Provider    Answer:   Hyacinth Meeker, BRIAN [[id number]]     *If you need refills on other medications prior to your next appointment, please contact your pharmacy*  Follow-Up: Call back or seek an in-person evaluation if the symptoms worsen or if the condition fails to improve as anticipated.  Other Instructions Vaginal Probiotics: AZO vaginal probiotic OLLY Happy Hoo-Ha RAW Vaginal Care RenewLife Women's vaginal probiotic RepHresh Pro-B  Vaginal washes: Honey Pot Summer's Eve Vagisil Feminine cleanser  Vaginitis  Vaginitis is irritation and swelling of the vagina. Treatment will depend on the cause. What are the causes? It can be  caused by: Bacteria. Yeast. A parasite. A virus. Low hormone levels. Bubble baths, scented tampons, and feminine sprays. Other things can change the balance of the yeast and bacteria that live in the vagina. These include: Antibiotic medicines. Not being clean enough. Some birth control methods. Sex. Infection. Diabetes. A weakened body defense system (immune system). What increases the risk? Smoking or being around someone who smokes. Using washes (douches), scented tampons, or scented pads. Wearing tight pants or thong underwear. Using birth control pills or an IUD. Having sex without a condom or having a lot of partners. Having an STI. Using a certain product to kill sperm (nonoxynol-9). Eating foods that are high in sugar. Having diabetes. Having low levels of a female hormone. Having a weakened body defense system. Being pregnant or breastfeeding. What are the signs or symptoms? Fluid coming from the vagina that is not normal. A bad smell. Itching, pain, or swelling. Pain with sex. Pain or burning when you pee (urinate). Sometimes there are no symptoms. How is this treated? Treatment may include: Antibiotic creams or pills. Antifungal medicines. Medicines to ease symptoms if you have a virus. Your sex partner should also be treated. Estrogen medicines. Avoiding scented soaps, sprays, or douches. Stopping use of products that caused irritation and then using a cream to treat symptoms. Follow these instructions at home: Lifestyle Keep the area around your vagina clean and dry. Avoid using soap. Rinse the area with water. Until your doctor says it is okay: Do not use  washes for the vagina. Do not use tampons. Do not have sex. Wipe from front to back after going to the bathroom. When your doctor says it is okay, practice safe sex and use condoms. General instructions Take over-the-counter and prescription medicines only as told by your doctor. If you were  prescribed an antibiotic medicine, take or use it as told by your doctor. Do not stop taking or using it even if you start to feel better. Keep all follow-up visits. How is this prevented? Do not use things that can irritate the vagina, such as fabric softeners. Avoid these products if they are scented: Sprays. Detergents. Tampons. Products for cleaning the vagina. Soaps or bubble baths. Let air reach your vagina. To do this: Wear cotton underwear. Do not wear: Underwear while you sleep. Tight pants. Thong underwear. Underwear or nylons without a cotton panel. Take off any wet clothing, such as bathing suits, as soon as you can. Practice safe sex and use condoms. Contact a doctor if: You have pain in your belly or in the area between your hips. You have a fever or chills. Your symptoms last for more than 2-3 days. Get help right away if: You have a fever and your symptoms get worse all of a sudden. Summary Vaginitis is irritation and swelling of the vagina. Treatment will depend on the cause of the condition. Do not use washes or tampons or have sex until your doctor says it is okay. This information is not intended to replace advice given to you by your health care provider. Make sure you discuss any questions you have with your health care provider. Document Revised: 07/06/2019 Document Reviewed: 07/06/2019 Elsevier Patient Education  2023 Elsevier Inc.    If you have been instructed to have an in-person evaluation today at a local Urgent Care facility, please use the link below. It will take you to a list of all of our available [facility], including address, phone number and hours of operation. Please do not delay care.  [facility]  If you or a family member do not have a primary care provider, use the link below to schedule a visit and establish care. When you choose a [facility] primary care physician or advanced practice provider, you gain a  long-term partner in health. Find a Primary Care Provider  Learn more about [facility]'s in-office and virtual care options: [facility] - Get Care Now
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
## 2021-06-27 NOTE — Progress Notes (Signed)
Because of your persistent BV unresponsive to medications or not tolerated,  I feel your condition warrants further evaluation and I recommend that you be seen in a face to face visit.   NOTE: There will be NO CHARGE for this eVisit   If you are having a true medical emergency please call 911.      For an urgent face to face visit, [facility] has six urgent care centers for your convenience:     [facility] Health Urgent Care Center at [facility] Directions [phone] [address] [place], [place] [address]    [facility] Health Urgent Care Center [facility]) Get Driving Directions [phone] [address] [place], [place] [address]  [facility] Health Urgent Care Center [facility]) Get Driving Directions [phone] [address] [place],  [place]  [address]  [facility] Health Urgent Care at [facility] Get Driving Directions [phone] [address] [place], [place] [address]   [facility] Health Urgent Care at [facility] Get Driving Directions  [phone] [address] [place], [place] [address]   [facility] Health Urgent Care at [facility] Directions [phone] [address] [place], [place] [address]  Your MyChart E-visit questionnaire answers were reviewed by a board certified advanced clinical practitioner to complete your personal care plan based on your specific symptoms.  Thank you for using e-Visits. I have provided 5 minutes of non face to face time during this encounter for chart review and documentation.

## 2021-07-09 ENCOUNTER — Telehealth: Payer: Medicaid Other | Admitting: Physician Assistant

## 2021-07-09 DIAGNOSIS — B379 Candidiasis, unspecified: Secondary | ICD-10-CM | POA: Diagnosis not present

## 2021-07-09 DIAGNOSIS — T3695XA Adverse effect of unspecified systemic antibiotic, initial encounter: Secondary | ICD-10-CM

## 2021-07-09 MED ORDER — NYSTATIN 100000 UNIT/GM EX CREA: 1.0000 | TOPICAL_CREAM | Freq: Two times a day (BID) | CUTANEOUS | 0 refills | Status: DC

## 2021-07-09 MED ORDER — FLUCONAZOLE 150 MG PO TABS: 150.0000 mg | ORAL_TABLET | Freq: Once | ORAL | 0 refills | Status: AC

## 2021-07-09 NOTE — Progress Notes (Signed)

## 2021-07-09 NOTE — Progress Notes (Signed)
I have spent 5 minutes in review of e-visit questionnaire, review and updating patient chart, medical decision making and response to patient.   Treyten Monestime Cody Peja Allender, PA-C    

## 2021-07-09 NOTE — Addendum Note (Signed)
Addended by: Waldon Merl on: 07/09/2021 01:24 PM   Modules accepted: Orders

## 2021-07-14 ENCOUNTER — Telehealth: Payer: Medicaid Other | Admitting: Physician Assistant

## 2021-07-14 DIAGNOSIS — N760 Acute vaginitis: Secondary | ICD-10-CM

## 2021-07-18 ENCOUNTER — Ambulatory Visit
Admission: RE | Admit: 2021-07-18 | Discharge: 2021-07-18 | Disposition: A | Discharge status: Home / Self Care | Payer: Medicaid Other | Source: Ambulatory Visit | Attending: Emergency Medicine | Admitting: Emergency Medicine

## 2021-07-18 VITALS — BP 150/90 | HR 82 | Temp 98.0°F | Resp 16

## 2021-07-18 DIAGNOSIS — N760 Acute vaginitis: Secondary | ICD-10-CM | POA: Diagnosis not present

## 2021-07-18 DIAGNOSIS — N898 Other specified noninflammatory disorders of vagina: Secondary | ICD-10-CM

## 2021-07-18 DIAGNOSIS — R1030 Lower abdominal pain, unspecified: Secondary | ICD-10-CM | POA: Diagnosis not present

## 2021-07-18 LAB — POCT URINALYSIS DIP (MANUAL ENTRY)
Bilirubin, UA: NEGATIVE
Glucose, UA: NEGATIVE mg/dL
Ketones, POC UA: NEGATIVE mg/dL
Leukocytes, UA: NEGATIVE
Nitrite, UA: NEGATIVE
Spec Grav, UA: >=1.03 — AB (ref 1.010–1.025)
Urobilinogen, UA: 2 E.U./dL — AB
pH, UA: 6 (ref 5.0–8.0)

## 2021-07-18 LAB — POCT URINE PREGNANCY: Preg Test, Ur: NEGATIVE
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
## 2021-07-18 NOTE — Discharge Instructions (Addendum)
Please schedule an appointment with a gynecologist as soon as possible.    Your vaginal tests are pending.  If your test results are positive, we will call you.  Do not have sexual activity for at least 7 days.    Go to the emergency department if you have worsening symptoms.

## 2021-07-18 NOTE — ED Triage Notes (Signed)
Patient presents to Urgent Care with complaints of abdominal pain and discharge x 2 weeks. Requests STD check.

## 2021-07-21 ENCOUNTER — Telehealth (HOSPITAL_COMMUNITY): Payer: Self-pay | Admitting: Emergency Medicine

## 2021-07-21 LAB — CERVICOVAGINAL ANCILLARY ONLY
Bacterial Vaginitis (gardnerella): POSITIVE — AB
Candida Glabrata: NEGATIVE
Candida Vaginitis: NEGATIVE
Chlamydia: NEGATIVE
Comment: NEGATIVE
Comment: NEGATIVE
Comment: NEGATIVE
Comment: NEGATIVE
Comment: NEGATIVE
Comment: NORMAL
Neisseria Gonorrhea: NEGATIVE
Trichomonas: NEGATIVE

## 2021-07-21 MED ORDER — METRONIDAZOLE 500 MG PO TABS: 500.0000 mg | ORAL_TABLET | Freq: Two times a day (BID) | ORAL | 0 refills | Status: DC

## 2021-08-19 ENCOUNTER — Ambulatory Visit: Payer: Medicaid Other

## 2021-08-21 ENCOUNTER — Telehealth: Payer: Medicaid Other | Admitting: Physician Assistant

## 2021-08-21 DIAGNOSIS — N760 Acute vaginitis: Secondary | ICD-10-CM | POA: Diagnosis not present

## 2021-08-21 DIAGNOSIS — B9689 Other specified bacterial agents as the cause of diseases classified elsewhere: Secondary | ICD-10-CM | POA: Diagnosis not present

## 2021-08-21 DIAGNOSIS — T3695XA Adverse effect of unspecified systemic antibiotic, initial encounter: Secondary | ICD-10-CM | POA: Diagnosis not present

## 2021-08-21 DIAGNOSIS — B379 Candidiasis, unspecified: Secondary | ICD-10-CM | POA: Diagnosis not present

## 2021-08-21 MED ORDER — CLINDAMYCIN HCL 300 MG PO CAPS: 300.0000 mg | ORAL_CAPSULE | Freq: Two times a day (BID) | ORAL | 0 refills | Status: DC

## 2021-08-21 MED ORDER — FLUCONAZOLE 150 MG PO TABS: ORAL_TABLET | ORAL | 0 refills | Status: DC
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
## 2021-08-21 NOTE — Patient Instructions (Signed)
[name], thank you for joining Piedad Climes, PA-C for today's virtual visit.  While this provider is not your primary care provider (PCP), if your PCP is located in our provider database this encounter information will be shared with them immediately following your visit.  Consent: (Patient) Diamond Sutton provided verbal consent for this virtual visit at the beginning of the encounter.  Current Medications:  Current Outpatient Medications:    clindamycin (CLEOCIN) 300 MG capsule, Take 1 capsule (300 mg total) by mouth 2 (two) times daily., Disp: 14 capsule, Rfl: 0   fluconazole (DIFLUCAN) 150 MG tablet, Take 1 tablet PO once. Repeat in 3 days if needed., Disp: 2 tablet, Rfl: 0   Medications ordered in this encounter:  Meds ordered this encounter  Medications   fluconazole (DIFLUCAN) 150 MG tablet    Sig: Take 1 tablet PO once. Repeat in 3 days if needed.    Dispense:  2 tablet    Refill:  0    Order Specific Question:   Supervising Provider    Answer:   MILLER, BRIAN [[id number]]   clindamycin (CLEOCIN) 300 MG capsule    Sig: Take 1 capsule (300 mg total) by mouth 2 (two) times daily.    Dispense:  14 capsule    Refill:  0    Order Specific Question:   Supervising Provider    Answer:   Hyacinth Meeker, BRIAN [[id number]]     *If you need refills on other medications prior to your next appointment, please contact your pharmacy*  Follow-Up: Call back or seek an in-person evaluation if the symptoms worsen or if the condition fails to improve as anticipated.  Other Instructions Bacterial Vaginosis  Bacterial vaginosis is an infection of the vagina. It happens when too many normal germs (healthy bacteria) grow in the vagina. This infection can make it easier to get other infections from sex (STIs). It is very important for pregnant women to get treated. This infection can cause babies to be born early or at a low birth weight. What are the causes? This infection is caused by an  increase in certain germs that grow in the vagina. You cannot get this infection from toilet seats, bedsheets, swimming pools, or things that touch your vagina. What increases the risk? Having sex with a new person or more than one person. Having sex without protection. Douching. Having an intrauterine device (IUD). Smoking. Using drugs or drinking alcohol. These can lead you to do things that are risky. Taking certain antibiotic medicines. Being pregnant. What are the signs or symptoms? Some women have no symptoms. Symptoms may include: A discharge from your vagina. It may be gray or white. It can be watery or foamy. A fishy smell. This can happen after sex or during your menstrual period. Itching in and around your vagina. A feeling of burning or pain when you pee (urinate). How is this treated? This infection is treated with antibiotic medicines. These may be given to you as: A pill. A cream for your vagina. A medicine that you put into your vagina (suppository). If the infection comes back after treatment, you may need more antibiotics. Follow these instructions at home: Medicines Take over-the-counter and prescription medicines as told by your doctor. Take or use your antibiotic medicine as told by your doctor. Do not stop taking or using it, even if you start to feel better. General instructions If the person you have sex with is a woman, tell her that you have this  infection. She will need to follow up with her doctor. If you have a female partner, he does not need to be treated. Do not have sex until you finish treatment. Drink enough fluid to keep your pee pale yellow. Keep your vagina and butt clean. Wash the area with warm water each day. Wipe from front to back after you use the toilet. If you are breastfeeding a baby, ask your doctor if you should keep doing so during treatment. Keep all follow-up visits. How is this prevented? Self-care Do not douche. Use only warm  water to wash around your vagina. Wear underwear that is cotton or lined with cotton. Do not wear tight pants and pantyhose, especially in the summer. Safe sex Use protection when you have sex. This includes: Use condoms. Use dental dams. This is a thin layer that protects the mouth during oral sex. Limit how many people you have sex with. To prevent this infection, it is best to have sex with just one person. Get tested for STIs. The person you have sex with should also get tested. Drugs and alcohol Do not smoke or use any products that contain nicotine or tobacco. If you need help quitting, ask your doctor. Do not use drugs. Do not drink alcohol if: Your doctor tells you not to drink. You are pregnant, may be pregnant, or are planning to become pregnant. If you drink alcohol: Limit how much you have to 0-1 drink a day. Know how much alcohol is in your drink. In the [place] one drink equals one 12 oz bottle of beer (355 mL), one 5 oz glass of wine (148 mL), or one 1 oz glass of hard liquor (44 mL). Where to find more information Centers for Disease Control and Prevention: FootballExhibition.com.br American Sexual Health Association: www.ashastd.org Office on [employer] Health: http://hoffman.com/ Contact a doctor if: Your symptoms do not get better, even after you are treated. You have more discharge or pain when you pee. You have a fever or chills. You have pain in your belly (abdomen) or in the area between your hips. You have pain with sex. You bleed from your vagina between menstrual periods. Summary This infection can happen when too many germs (bacteria) grow in the vagina. This infection can make it easier to get infections from sex (STIs). Treating this can lower that chance. Get treated if you are pregnant. This infection can cause babies to be born early. Do not stop taking or using your antibiotic medicine, even if you start to feel better. This information is not intended to replace  advice given to you by your health care provider. Make sure you discuss any questions you have with your health care provider. Document Revised: 07/06/2019 Document Reviewed: 07/06/2019 Elsevier Patient Education  2023 Elsevier Inc.    If you have been instructed to have an in-person evaluation today at a local Urgent Care facility, please use the link below. It will take you to a list of all of our available [facility], including address, phone number and hours of operation. Please do not delay care.  [facility]  If you or a family member do not have a primary care provider, use the link below to schedule a visit and establish care. When you choose a [facility] primary care physician or advanced practice provider, you gain a long-term partner in health. Find a Primary Care Provider  Learn more about [facility]'s in-office and virtual care options: [facility] - Get  Care Now

## 2021-09-14 ENCOUNTER — Ambulatory Visit
Admission: RE | Admit: 2021-09-14 | Discharge: 2021-09-14 | Disposition: A | Discharge status: Home / Self Care | Payer: Medicaid Other | Source: Ambulatory Visit | Attending: Emergency Medicine | Admitting: Emergency Medicine

## 2021-09-14 ENCOUNTER — Ambulatory Visit: Payer: Medicaid Other

## 2021-09-14 VITALS — BP 143/88 | HR 75 | Temp 98.0°F | Resp 17 | Ht 65.0 in | Wt 155.0 lb

## 2021-09-14 DIAGNOSIS — R1030 Lower abdominal pain, unspecified: Secondary | ICD-10-CM | POA: Diagnosis not present

## 2021-09-14 DIAGNOSIS — R109 Unspecified abdominal pain: Secondary | ICD-10-CM | POA: Diagnosis not present

## 2021-09-14 DIAGNOSIS — N898 Other specified noninflammatory disorders of vagina: Secondary | ICD-10-CM | POA: Diagnosis not present

## 2021-09-14 DIAGNOSIS — Z113 Encounter for screening for infections with a predominantly sexual mode of transmission: Secondary | ICD-10-CM

## 2021-09-14 DIAGNOSIS — R300 Dysuria: Secondary | ICD-10-CM

## 2021-09-14 LAB — POCT URINALYSIS DIP (MANUAL ENTRY)
Bilirubin, UA: NEGATIVE
Glucose, UA: NEGATIVE mg/dL
Ketones, POC UA: NEGATIVE mg/dL
Nitrite, UA: NEGATIVE
Protein Ur, POC: 30 mg/dL — AB
Spec Grav, UA: >=1.03 — AB (ref 1.010–1.025)
Urobilinogen, UA: 1 E.U./dL
pH, UA: 6 (ref 5.0–8.0)

## 2021-09-14 LAB — POCT URINE PREGNANCY: Preg Test, Ur: NEGATIVE
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
## 2021-09-14 NOTE — Discharge Instructions (Addendum)
The urine culture is pending.  We will call you if it shows the need for treatment.    Your other tests are pending.  If your test results are positive, we will call you.  You and your sexual partner(s) may require treatment at that time.  Do not have sexual activity for at least 7 days.    Go to the emergency department if you have acute worsening symptoms.    Follow up with your primary care provider if your symptoms are not improving.

## 2021-09-14 NOTE — ED Triage Notes (Signed)
Patient to Urgent Care with complaints of bilateral flank pain and vaginal discharge. Reports flank pain radiates into abdomen, describes it as a sharp and aching . Denies dysuria but reports some urinary frequency. Symptoms started 3 days ago.   Also requests STD testing

## 2021-09-15 LAB — CERVICOVAGINAL ANCILLARY ONLY
Bacterial Vaginitis (gardnerella): POSITIVE — AB
Candida Glabrata: NEGATIVE
Candida Vaginitis: NEGATIVE
Chlamydia: NEGATIVE
Comment: NEGATIVE
Comment: NEGATIVE
Comment: NEGATIVE
Comment: NEGATIVE
Comment: NEGATIVE
Comment: NORMAL
Neisseria Gonorrhea: NEGATIVE
Trichomonas: NEGATIVE

## 2021-09-16 ENCOUNTER — Telehealth (HOSPITAL_COMMUNITY): Payer: Self-pay | Admitting: Emergency Medicine

## 2021-09-16 LAB — URINE CULTURE: Culture: >=100000 — AB

## 2021-09-16 LAB — RPR: RPR Ser Ql: NONREACTIVE

## 2021-09-16 LAB — HIV ANTIBODY (ROUTINE TESTING W REFLEX): HIV Screen 4th Generation wRfx: NONREACTIVE

## 2021-09-16 MED ORDER — NITROFURANTOIN MONOHYD MACRO 100 MG PO CAPS: 100.0000 mg | ORAL_CAPSULE | Freq: Two times a day (BID) | ORAL | 0 refills | Status: DC

## 2021-09-16 MED ORDER — METRONIDAZOLE 500 MG PO TABS: 500.0000 mg | ORAL_TABLET | Freq: Two times a day (BID) | ORAL | 0 refills | Status: DC

## 2021-10-01 ENCOUNTER — Encounter: Payer: Self-pay | Admitting: Physician Assistant

## 2021-10-02 ENCOUNTER — Telehealth: Payer: Medicaid Other | Admitting: Family Medicine

## 2021-10-02 DIAGNOSIS — K0889 Other specified disorders of teeth and supporting structures: Secondary | ICD-10-CM

## 2021-10-02 NOTE — Progress Notes (Signed)
[facility]   Face to face needed has several issues that look like they need to be addressed including on going pain related to previous vaginal discharge.

## 2021-10-19 ENCOUNTER — Telehealth: Payer: Medicaid Other | Admitting: Nurse Practitioner

## 2021-10-19 DIAGNOSIS — T3695XA Adverse effect of unspecified systemic antibiotic, initial encounter: Secondary | ICD-10-CM | POA: Diagnosis not present

## 2021-10-19 DIAGNOSIS — B9689 Other specified bacterial agents as the cause of diseases classified elsewhere: Secondary | ICD-10-CM

## 2021-10-19 DIAGNOSIS — B379 Candidiasis, unspecified: Secondary | ICD-10-CM | POA: Diagnosis not present

## 2021-10-19 DIAGNOSIS — N760 Acute vaginitis: Secondary | ICD-10-CM

## 2021-10-19 MED ORDER — FLUCONAZOLE 150 MG PO TABS: ORAL_TABLET | ORAL | 0 refills | Status: DC

## 2021-10-19 MED ORDER — METRONIDAZOLE 0.75 % VA GEL: VAGINAL | 0 refills | Status: DC
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
## 2021-10-19 NOTE — Patient Instructions (Signed)
  If you or a family member do not have a primary care provider, use the link below to schedule a visit and establish care. When you choose a [facility] primary care physician or advanced practice provider, you gain a long-term partner in health. Find a Primary Care Provider       Warnell Bureau, thank you for joining Gildardo Pounds, NP for today's virtual visit.  While this provider is not your primary care provider (PCP), if your PCP is located in our provider database this encounter information will be shared with them immediately following your visit.  Consent: (Patient) Diamond Sutton provided verbal consent for this virtual visit at the beginning of the encounter.  Current Medications:  Current Outpatient Medications:    metroNIDAZOLE (METROGEL VAGINAL) 0.75 % vaginal gel, Place 1 applicatorful at bedtime for 5 days, Disp: 70 g, Rfl: 0   fluconazole (DIFLUCAN) 150 MG tablet, Take 1 tablet PO once. Repeat in 3 days if needed., Disp: 2 tablet, Rfl: 0   nitrofurantoin, macrocrystal-monohydrate, (MACROBID) 100 MG capsule, Take 1 capsule (100 mg total) by mouth 2 (two) times daily., Disp: 10 capsule, Rfl: 0   Medications ordered in this encounter:  Meds ordered this encounter  Medications   metroNIDAZOLE (METROGEL VAGINAL) 0.75 % vaginal gel    Sig: Place 1 applicatorful at bedtime for 5 days    Dispense:  70 g    Refill:  0    Order Specific Question:   Supervising Provider    Answer:   MILLER, BRIAN [[id number]]   fluconazole (DIFLUCAN) 150 MG tablet    Sig: Take 1 tablet PO once. Repeat in 3 days if needed.    Dispense:  2 tablet    Refill:  0    Order Specific Question:   Supervising Provider    Answer:   Sabra Heck, BRIAN [[id number]]     *If you need refills on other medications prior to your next appointment, please contact your pharmacy*    Learn more about [facility]'s in-office and virtual care options: [facility]

## 2021-10-28 ENCOUNTER — Encounter: Payer: Self-pay | Admitting: Certified Nurse Midwife

## 2021-11-28 ENCOUNTER — Emergency Department
Admission: EM | Admit: 2021-11-28 | Discharge: 2021-11-28 | Disposition: A | Discharge status: Home / Self Care | Payer: Medicaid Other | Attending: Emergency Medicine | Admitting: Emergency Medicine

## 2021-11-28 ENCOUNTER — Other Ambulatory Visit: Payer: Self-pay

## 2021-11-28 ENCOUNTER — Encounter: Payer: Self-pay | Admitting: Emergency Medicine

## 2021-11-28 DIAGNOSIS — Z1152 Encounter for screening for COVID-19: Secondary | ICD-10-CM | POA: Insufficient documentation

## 2021-11-28 DIAGNOSIS — J069 Acute upper respiratory infection, unspecified: Secondary | ICD-10-CM | POA: Diagnosis not present

## 2021-11-28 DIAGNOSIS — Z87891 Personal history of nicotine dependence: Secondary | ICD-10-CM | POA: Diagnosis not present

## 2021-11-28 DIAGNOSIS — R0981 Nasal congestion: Secondary | ICD-10-CM | POA: Diagnosis present

## 2021-11-28 LAB — RESP PANEL BY RT-PCR (FLU A&B, COVID) ARPGX2
Influenza A by PCR: NEGATIVE
Influenza B by PCR: NEGATIVE
SARS Coronavirus 2 by RT PCR: NEGATIVE

## 2021-11-28 LAB — GROUP A STREP BY PCR: Group A Strep by PCR: NOT DETECTED

## 2021-11-28 MED ORDER — ACETAMINOPHEN 325 MG PO TABS
650.0000 mg | ORAL_TABLET | Freq: Once | ORAL | Status: AC
  Administered 2021-11-28: 650 mg via ORAL
  Filled 2021-11-28: qty 2
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
## 2021-11-28 NOTE — Discharge Instructions (Signed)
Continue to take Tylenol/ibuprofen per package instructions to help with your symptoms.  Please return for any new, worsening, or change in symptoms or other concerns.   

## 2021-11-28 NOTE — ED Triage Notes (Signed)
Pt here with chills, headache, and general illness for a few days. Pt states she was in contact with someone that was ill. Pt also states she has been having a fever at home.

## 2021-11-28 NOTE — ED Notes (Signed)
See triage note  Presents with some body aches and cough  States she has been around someone sick over the past few days  Afebrile on arrival

## 2022-01-14 ENCOUNTER — Telehealth: Payer: Medicaid Other | Admitting: Physician Assistant

## 2022-01-14 DIAGNOSIS — N760 Acute vaginitis: Secondary | ICD-10-CM

## 2022-01-14 DIAGNOSIS — B9689 Other specified bacterial agents as the cause of diseases classified elsewhere: Secondary | ICD-10-CM | POA: Diagnosis not present

## 2022-01-14 MED ORDER — METRONIDAZOLE 500 MG PO TABS: 500.0000 mg | ORAL_TABLET | Freq: Two times a day (BID) | ORAL | 0 refills | Status: DC

## 2022-01-14 NOTE — Progress Notes (Signed)
I have spent 5 minutes in review of e-visit questionnaire, review and updating patient chart, medical decision making and response to patient.   Lindzee Gouge Cody Nedim Oki, PA-C    

## 2022-01-14 NOTE — Progress Notes (Signed)

## 2022-01-21 ENCOUNTER — Telehealth: Payer: Medicaid Other

## 2022-01-21 ENCOUNTER — Telehealth: Payer: Medicaid Other | Admitting: Physician Assistant

## 2022-01-21 DIAGNOSIS — A084 Viral intestinal infection, unspecified: Secondary | ICD-10-CM | POA: Diagnosis not present

## 2022-01-21 MED ORDER — ONDANSETRON HCL 4 MG PO TABS: 4.0000 mg | ORAL_TABLET | Freq: Three times a day (TID) | ORAL | 0 refills | Status: DC | PRN

## 2022-01-21 NOTE — Progress Notes (Signed)
E-Visit for Nausea and Vomiting   We are sorry that you are not feeling well. Here is how we plan to help!  Based on what you have shared with me it looks like you have a Virus that is irritating your GI tract.  Vomiting is the forceful emptying of a portion of the stomach's content through the mouth.  Although nausea and vomiting can make you feel miserable, it's important to remember that these are not diseases, but rather symptoms of an underlying illness.  When we treat short term symptoms, we always caution that any symptoms that persist should be fully evaluated in a medical office.  I have prescribed a medication that will help alleviate your symptoms and allow you to stay hydrated:  Zofran 4 mg 1 tablet every 8 hours as needed for nausea and vomiting  HOME CARE: Drink clear liquids.  This is very important! Dehydration (the lack of fluid) can lead to a serious complication.  Start off with 1 tablespoon every 5 minutes for 8 hours. You may begin eating bland foods after 8 hours without vomiting.  Start with saltine crackers, white bread, rice, mashed potatoes, applesauce. After 48 hours on a bland diet, you may resume a normal diet. Try to go to sleep.  Sleep often empties the stomach and relieves the need to vomit.  GET HELP RIGHT AWAY IF:  Your symptoms do not improve or worsen within 2 days after treatment. You have a fever for over 3 days. You cannot keep down fluids after trying the medication.  MAKE SURE YOU:  Understand these instructions. Will watch your condition. Will get help right away if you are not doing well or get worse.    Thank you for choosing an e-visit.  Your e-visit answers were reviewed by a board certified advanced clinical practitioner to complete your personal care plan. Depending upon the condition, your plan could have included both over the counter or prescription medications.  Please review your pharmacy choice. Make sure the pharmacy is open so  you can pick up prescription now. If there is a problem, you may contact your provider through MyChart messaging and have the prescription routed to another pharmacy.  Your safety is important to us. If you have drug allergies check your prescription carefully.   For the next 24 hours you can use MyChart to ask questions about today's visit, request a non-urgent call back, or ask for a work or school excuse. You will get an email in the next two days asking about your experience. I hope that your e-visit has been valuable and will speed your recovery.  I have spent 5 minutes in review of e-visit questionnaire, review and updating patient chart, medical decision making and response to patient.   Ziya Coonrod M Jonovan Boedecker, PA-C  

## 2022-01-23 ENCOUNTER — Telehealth: Payer: Medicaid Other | Admitting: Emergency Medicine

## 2022-01-23 ENCOUNTER — Telehealth: Payer: Medicaid Other | Admitting: Family Medicine

## 2022-01-23 DIAGNOSIS — R112 Nausea with vomiting, unspecified: Secondary | ICD-10-CM

## 2022-01-23 DIAGNOSIS — R197 Diarrhea, unspecified: Secondary | ICD-10-CM

## 2022-01-23 NOTE — Progress Notes (Signed)
[place]   Needs to follow up in person for on going symptoms and out of work needs. Did two EVs this morning, was treated on 01/21/22 and given work note- reports not any better.

## 2022-01-23 NOTE — Progress Notes (Signed)
[place]   Needs to be seen in person- still having symptoms two days after treatment via another EV Additionally has submitted two EVs will cancel the other one.

## 2022-01-30 ENCOUNTER — Telehealth: Payer: Medicaid Other | Admitting: Nurse Practitioner

## 2022-01-30 DIAGNOSIS — J069 Acute upper respiratory infection, unspecified: Secondary | ICD-10-CM

## 2022-01-30 MED ORDER — FLUTICASONE PROPIONATE 50 MCG/ACT NA SUSP: 2.0000 | Freq: Every day | NASAL | 6 refills | Status: DC

## 2022-01-30 NOTE — Progress Notes (Signed)
E-Visit for Upper Respiratory Infection   We are sorry you are not feeling well.  Here is how we plan to help!  Based on what you have shared with me, it looks like you may have a viral upper respiratory infection.  Upper respiratory infections are caused by a large number of viruses; however, rhinovirus is the most common cause. These symptoms are consistent with the symptoms of COVID-19 and you may want to consider taking a home COVID test.  Symptoms vary from person to person, with common symptoms including sore throat, cough, fatigue or lack of energy and feeling of general discomfort.  A low-grade fever of up to 100.4 may present, but is often uncommon.  Symptoms vary however, and are closely related to a person's age or underlying illnesses.  The most common symptoms associated with an upper respiratory infection are nasal discharge or congestion, cough, sneezing, headache and pressure in the ears and face.  These symptoms usually persist for about 3 to 10 days, but can last up to 2 weeks.  It is important to know that upper respiratory infections do not cause serious illness or complications in most cases.    Upper respiratory infections can be transmitted from person to person, with the most common method of transmission being a person's hands.  The virus is able to live on the skin and can infect other persons for up to 2 hours after direct contact.  Also, these can be transmitted when someone coughs or sneezes; thus, it is important to cover the mouth to reduce this risk.  To keep the spread of the illness at [place], good hand hygiene is very important.  This is an infection that is most likely caused by a virus. There are no specific treatments other than to help you with the symptoms until the infection runs its course.  We are sorry you are not feeling well.  Here is how we plan to help!   For nasal congestion, you may use an oral decongestants such as Mucinex D or if you have glaucoma or high  blood pressure use plain Mucinex.  Saline nasal spray or nasal drops can help and can safely be used as often as needed for congestion.  For your congestion, I have prescribed Fluticasone nasal spray one spray in each nostril twice a day  If you do not have a history of heart disease, hypertension, diabetes or thyroid disease, prostate/bladder issues or glaucoma, you may also use Sudafed to treat nasal congestion.  It is highly recommended that you consult with a pharmacist or your primary care physician to ensure this medication is safe for you to take.     If you have a cough, you may use cough suppressants such as Delsym and Robitussin.  If you have glaucoma or high blood pressure, you can also use Coricidin HBP.     If you have a sore or scratchy throat, use a saltwater gargle-  to  teaspoon of salt dissolved in a 4-ounce to 8-ounce glass of warm water.  Gargle the solution for approximately 15-30 seconds and then spit.  It is important not to swallow the solution.  You can also use throat lozenges/cough drops and Chloraseptic spray to help with throat pain or discomfort.  Warm or cold liquids can also be helpful in relieving throat pain.  For headache, pain or general discomfort, you can use Ibuprofen or Tylenol as directed.   Some authorities believe that zinc sprays or the use of Echinacea  may shorten the course of your symptoms.   HOME CARE Only take medications as instructed by your medical team. Be sure to drink plenty of fluids. Water is fine as well as fruit juices, sodas and electrolyte beverages. You may want to stay away from caffeine or alcohol. If you are nauseated, try taking small sips of liquids. How do you know if you are getting enough fluid? Your urine should be a pale yellow or almost colorless. Get rest. Taking a steamy shower or using a humidifier may help nasal congestion and ease sore throat pain. You can place a towel over your head and breathe in the steam from hot  water coming from a faucet. Using a saline nasal spray works much the same way. Cough drops, hard candies and sore throat lozenges may ease your cough. Avoid close contacts especially the very young and the elderly Cover your mouth if you cough or sneeze Always remember to wash your hands.   GET HELP RIGHT AWAY IF: You develop worsening fever. If your symptoms do not improve within 10 days You develop yellow or green discharge from your nose over 3 days. You have coughing fits You develop a severe head ache or visual changes. You develop shortness of breath, difficulty breathing or start having chest pain Your symptoms persist after you have completed your treatment plan  MAKE SURE YOU  Understand these instructions. Will watch your condition. Will get help right away if you are not doing well or get worse.  Thank you for choosing an e-visit.  Your e-visit answers were reviewed by a board certified advanced clinical practitioner to complete your personal care plan. Depending upon the condition, your plan could have included both over the counter or prescription medications.  Please review your pharmacy choice. Make sure the pharmacy is open so you can pick up prescription now. If there is a problem, you may contact your provider through [employer] and have the prescription routed to another pharmacy.  Your safety is important to [place]. If you have drug allergies check your prescription carefully.   For the next 24 hours you can use MyChart to ask questions about today's visit, request a non-urgent call back, or ask for a work or school excuse. You will get an email in the next two days asking about your experience. I hope that your e-visit has been valuable and will speed your recovery.  Meds ordered this encounter  Medications   fluticasone (FLONASE) 50 MCG/ACT nasal spray    Sig: Place 2 sprays into both nostrils daily.    Dispense:  16 g    Refill:  6    I spent  approximately 5 minutes reviewing the patient's history, current symptoms and coordinating their care today.

## 2022-02-03 ENCOUNTER — Telehealth: Payer: Medicaid Other | Admitting: Physician Assistant

## 2022-02-03 ENCOUNTER — Telehealth: Payer: Medicaid Other

## 2022-02-03 DIAGNOSIS — N760 Acute vaginitis: Secondary | ICD-10-CM

## 2022-02-03 NOTE — Progress Notes (Signed)
Because of the belly pain which is not common with run of the mill vaginal infections (yeast or BV), I would really recommend being seen in person so they can do a swab to verify if this is BV, yeast, combination of the two or something else so we can make sure you are getting the correct treatment, especially with how your body reacted to the previous medication.    NOTE: There will be NO CHARGE for this eVisit   If you are having a true medical emergency please call 911.      For an urgent face to face visit, [place] has eight urgent care centers for your convenience:   NEW!! [facility] [place] [facility] [place] Get Driving Directions [phone] [address] [place], [place] [facility] [place] [facility] [place] Get Driving Directions [phone] [place], [place] [address]   [place] [facility]) Get Driving Directions [phone] [address], [place] [address]  [facility] [place] ([place]) Get Driving Directions [phone] [address] [place],  [place]  [address]  [place] [facility] [facility] - at [address] [place] Get Driving Directions  [phone] [address] [place],  [place] [address]   [facility] Get Driving Directions [phone] [address] [place], [place], [place] [address]   [place] [facility] Get Driving Directions  [phone] [address] [place], [place] [address]   [facility] [place] Get Driving Directions [phone] [address], [place], [place] [address]  Your MyChart E-visit questionnaire answers were reviewed by a board certified advanced clinical practitioner to complete your personal care plan based on your specific symptoms.  Thank you for  using e-Visits.

## 2022-02-04 ENCOUNTER — Ambulatory Visit
Admission: RE | Admit: 2022-02-04 | Discharge: 2022-02-04 | Disposition: A | Discharge status: Home / Self Care | Payer: Medicaid Other | Source: Ambulatory Visit | Attending: Emergency Medicine | Admitting: Emergency Medicine

## 2022-02-04 VITALS — BP 163/111 | HR 85 | Temp 97.8°F | Resp 18 | Ht 66.0 in | Wt 165.0 lb

## 2022-02-04 DIAGNOSIS — I10 Essential (primary) hypertension: Secondary | ICD-10-CM

## 2022-02-04 DIAGNOSIS — R1030 Lower abdominal pain, unspecified: Secondary | ICD-10-CM | POA: Diagnosis not present

## 2022-02-04 DIAGNOSIS — N898 Other specified noninflammatory disorders of vagina: Secondary | ICD-10-CM

## 2022-02-04 DIAGNOSIS — Z113 Encounter for screening for infections with a predominantly sexual mode of transmission: Secondary | ICD-10-CM | POA: Diagnosis not present

## 2022-02-04 LAB — POCT URINALYSIS DIP (MANUAL ENTRY)
Bilirubin, UA: NEGATIVE
Glucose, UA: NEGATIVE mg/dL
Ketones, POC UA: NEGATIVE mg/dL
Nitrite, UA: NEGATIVE
Spec Grav, UA: 1.025 (ref 1.010–1.025)
Urobilinogen, UA: 0.2 E.U./dL
pH, UA: 5.5 (ref 5.0–8.0)

## 2022-02-04 LAB — POCT URINE PREGNANCY: Preg Test, Ur: NEGATIVE

## 2022-02-04 MED ORDER — METRONIDAZOLE 1.3 % VA GEL: 1.0000 | Freq: Every day | VAGINAL | 0 refills | Status: DC

## 2022-02-04 MED ORDER — METRONIDAZOLE 0.75 % VA GEL: 1.0000 | Freq: Every day | VAGINAL | 0 refills | Status: AC

## 2022-02-04 NOTE — ED Notes (Signed)
New PCP appointment set up for patient. Appointment made for January 31 at [facility].
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
## 2022-02-04 NOTE — ED Triage Notes (Signed)
Patient to Urgent Care with complaints of vaginal itching and vaginal discharge. States she also has an "aching pain" in her vagina. Symptoms started x3 days ago.   Reports that she frequently has BV. States she was recently treated for BV but was unable to complete treatment because the medication caused her to be nauseated and she was unable to keep it down.

## 2022-02-04 NOTE — Discharge Instructions (Addendum)
Use the metronidazole vaginal gel as directed.     Your vaginal tests are pending.  If your test results are positive, we will call you.  You and your sexual partner(s) may require treatment at that time.  Do not have sexual activity for at least 7 days.    Go to the emergency department if you have abdominal pain or other concerning symptoms.    Your blood pressure is elevated today at 160/90.  Please have this r/echecked by your primary care provider in 2-4 weeks.

## 2022-02-05 LAB — CERVICOVAGINAL ANCILLARY ONLY
Bacterial Vaginitis (gardnerella): POSITIVE — AB
Candida Glabrata: NEGATIVE
Candida Vaginitis: NEGATIVE
Chlamydia: NEGATIVE
Comment: NEGATIVE
Comment: NEGATIVE
Comment: NEGATIVE
Comment: NEGATIVE
Comment: NEGATIVE
Comment: NORMAL
Neisseria Gonorrhea: NEGATIVE
Trichomonas: NEGATIVE

## 2022-02-06 ENCOUNTER — Telehealth (HOSPITAL_COMMUNITY): Payer: Self-pay | Admitting: Emergency Medicine

## 2022-02-06 LAB — URINE CULTURE: Culture: >=100000 — AB

## 2022-02-06 MED ORDER — NITROFURANTOIN MONOHYD MACRO 100 MG PO CAPS: 100.0000 mg | ORAL_CAPSULE | Freq: Two times a day (BID) | ORAL | 0 refills | Status: DC

## 2022-02-18 ENCOUNTER — Ambulatory Visit: Payer: Medicaid Other | Admitting: Family Medicine

## 2022-02-21 ENCOUNTER — Telehealth: Payer: Medicaid Other | Admitting: Physician Assistant

## 2022-02-21 DIAGNOSIS — F419 Anxiety disorder, unspecified: Secondary | ICD-10-CM

## 2022-02-21 MED ORDER — HYDROXYZINE HCL 10 MG PO TABS: 10.0000 mg | ORAL_TABLET | Freq: Three times a day (TID) | ORAL | 0 refills | Status: DC | PRN
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
## 2022-02-22 ENCOUNTER — Encounter: Payer: Self-pay | Admitting: Physician Assistant

## 2022-02-27 ENCOUNTER — Other Ambulatory Visit: Payer: Self-pay

## 2022-02-27 ENCOUNTER — Emergency Department
Admission: EM | Admit: 2022-02-27 | Discharge: 2022-02-27 | Disposition: A | Discharge status: Home / Self Care | Payer: Medicaid Other | Attending: Emergency Medicine | Admitting: Emergency Medicine

## 2022-02-27 DIAGNOSIS — J069 Acute upper respiratory infection, unspecified: Secondary | ICD-10-CM | POA: Insufficient documentation

## 2022-02-27 DIAGNOSIS — B9789 Other viral agents as the cause of diseases classified elsewhere: Secondary | ICD-10-CM | POA: Diagnosis not present

## 2022-02-27 DIAGNOSIS — R0981 Nasal congestion: Secondary | ICD-10-CM | POA: Diagnosis present

## 2022-02-27 DIAGNOSIS — Z20822 Contact with and (suspected) exposure to covid-19: Secondary | ICD-10-CM | POA: Diagnosis not present

## 2022-02-27 LAB — RESP PANEL BY RT-PCR (RSV, FLU A&B, COVID)  RVPGX2
Influenza A by PCR: NEGATIVE
Influenza B by PCR: NEGATIVE
Resp Syncytial Virus by PCR: NEGATIVE
SARS Coronavirus 2 by RT PCR: NEGATIVE

## 2022-02-27 NOTE — Discharge Instructions (Addendum)
Your viral swab came back negative for influenza, COVID, and RSV.  However, with your children both testing positive for influenza, it is very likely you will have the flu as well.  Continue taking ibuprofen as needed and focus on drinking fluids and staying hydrated.

## 2022-02-27 NOTE — ED Provider Notes (Signed)
   [facility] Provider Note    Event Date/Time   First MD Initiated Contact with Patient 02/27/22 [phone]     (approximate)   History   Nasal Congestion   HPI  CATALAYA GARR is a 37 y.o. female with no significant past medical history who comes to the ED with nasal congestion and nonproductive cough that started last night.  Her son and daughter have both recently tested positive for the flu.  No vomiting or diarrhea.  Eating and drinking normally.  No fever.     Physical Exam   Triage Vital Signs: ED Triage Vitals  Enc Vitals Group     BP 02/27/22 0733 (!) 163/99     Pulse Rate 02/27/22 0733 93     Resp 02/27/22 0733 18     Temp 02/27/22 0733 98.7 F (37.1 C)     Temp Source 02/27/22 0733 Oral     SpO2 02/27/22 0733 99 %     Weight --      Height --      Head Circumference --      Peak Flow --      Pain Score 02/27/22 0734 0     Pain Loc --      Pain Edu? --      Excl. in [place]? --     Most recent vital signs: Vitals:   02/27/22 0733  BP: (!) 163/99  Pulse: 93  Resp: 18  Temp: 98.7 F (37.1 C)  SpO2: 99%    General: Awake, no distress.  CV:  Good peripheral perfusion.  Resp:  Normal effort.  Abd:  No distention.  Other:  No oropharyngeal erythema or swelling.  Moist oral mucosa.   ED Results / Procedures / Treatments   Labs (all labs ordered are listed, but only abnormal results are displayed) Labs Reviewed  RESP PANEL BY RT-PCR (RSV, FLU A&B, COVID)  [id number]     RADIOLOGY    PROCEDURES:  Procedures   MEDICATIONS ORDERED IN ED: Medications - No data to display   IMPRESSION / MDM / [place] / ED COURSE  I reviewed the triage vital signs and the nursing notes.                             Patient presents with clinically apparent viral URI.  No distress.  Normal vitals.  Anticipatory guidance provided.      FINAL CLINICAL IMPRESSION(S) / ED DIAGNOSES   Final diagnoses:  Viral upper respiratory  tract infection     Rx / DC Orders   ED Discharge Orders     None        Note:  This document was prepared using Dragon voice recognition software and may include unintentional dictation errors.   Carrie Mew, MD 02/27/22 [phone]

## 2022-02-27 NOTE — ED Triage Notes (Signed)
Pt presents the ED via POV due to flu exposure. Pt states he son was diagnosed with the flu yesterday and she just wants to be tested. Pt A&Ox4

## 2022-03-02 ENCOUNTER — Encounter: Payer: Self-pay | Admitting: Physician Assistant

## 2022-03-03 ENCOUNTER — Telehealth: Payer: Medicaid Other | Admitting: Physician Assistant

## 2022-03-03 DIAGNOSIS — B9689 Other specified bacterial agents as the cause of diseases classified elsewhere: Secondary | ICD-10-CM

## 2022-03-03 DIAGNOSIS — N760 Acute vaginitis: Secondary | ICD-10-CM | POA: Diagnosis not present

## 2022-03-03 MED ORDER — METRONIDAZOLE 0.75 % EX GEL: CUTANEOUS | 0 refills | Status: DC
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
## 2022-03-03 NOTE — Patient Instructions (Signed)
  Warnell Bureau, thank you for joining Leeanne Rio, PA-C for today's virtual visit.  While this provider is not your primary care provider (PCP), if your PCP is located in our provider database this encounter information will be shared with them immediately following your visit.   [facility] account gives you access to today's visit and all your visits, tests, and labs performed at [facility] " click here if you don't have a [place] account or go to [email or web address]  Consent: (Patient) Diamond Sutton provided verbal consent for this virtual visit at the beginning of the encounter.  Current Medications:  Current Outpatient Medications:    metroNIDAZOLE (METROGEL) 0.75 % gel, Insert one applicatorful (5g) of medicine into the vagina once nightly x 5 days, Disp: 25 g, Rfl: 0   fluticasone (FLONASE) 50 MCG/ACT nasal spray, Place 2 sprays into both nostrils daily., Disp: 16 g, Rfl: 6   hydrOXYzine (ATARAX) 10 MG tablet, Take 1 tablet (10 mg total) by mouth 3 (three) times daily as needed., Disp: 30 tablet, Rfl: 0   nitrofurantoin, macrocrystal-monohydrate, (MACROBID) 100 MG capsule, Take 1 capsule (100 mg total) by mouth 2 (two) times daily., Disp: 10 capsule, Rfl: 0   ondansetron (ZOFRAN) 4 MG tablet, Take 1 tablet (4 mg total) by mouth every 8 (eight) hours as needed for nausea or vomiting., Disp: 20 tablet, Rfl: 0   Medications ordered in this encounter:  Meds ordered this encounter  Medications   metroNIDAZOLE (METROGEL) 0.75 % gel    Sig: Insert one applicatorful (5g) of medicine into the vagina once nightly x 5 days    Dispense:  25 g    Refill:  0    Order Specific Question:   Supervising Provider    Answer:   Chase Picket [id number]     *If you need refills on other medications prior to your next appointment, please contact your pharmacy*  Follow-Up: Call back or seek an in-person evaluation if the symptoms worsen or  if the condition fails to improve as anticipated.  [place] [phone]  Other Instructions Recommend breathable undergarments and loose-fitting clothing. Recommend trial of LUME deoderant OTC. Take the Metrogel as directed. Use link below to help get set up with a new PCP/GYN.   If you have been instructed to have an in-person evaluation today at a local Urgent Care facility, please use the link below. It will take you to a list of all of our available [facility], including address, phone number and hours of operation. Please do not delay care.  [facility]  If you or a family member do not have a primary care provider, use the link below to schedule a visit and establish care. When you choose a [facility] primary care physician or advanced practice provider, you gain a long-term partner in health. Find a Primary Care Provider  Learn more about [facility]'s in-office and virtual care options: [facility]

## 2022-03-25 ENCOUNTER — Ambulatory Visit: Payer: Self-pay | Admitting: *Deleted

## 2022-03-25 ENCOUNTER — Encounter: Payer: Self-pay | Admitting: Family Medicine

## 2022-03-25 NOTE — Telephone Encounter (Signed)
Chief Complaint: requesting new patient appt. Anxiety  Symptoms: no anxiety now. Has episodes occurring more often in the last 2 months . Causes sx chest discomfort, rapid heart beat, difficulty breathing, nausea.,sweating. Frequency: 2 months  Pertinent Negatives: Patient denies anxiety or sx now due to just took medication  Disposition: '[]'$ ED /'[x]'$ Urgent Care (no appt availability in office) / '[]'$ Appointment(In office/virtual)/ '[]'$  [facility]/ '[]'$ Home Care/ '[]'$ Refused Recommended Disposition /'[]'$ [place]/ '[]'$  Follow-up with PCP Additional Notes:   Requesting new patient appt sooner than 08/25/22. Please advise if earlier appt can be established. Patient would like a call back. Recommended if sx return or medication needed to seek assistance UC/ED. Please advise. Attempted to contact [facility] no answer. Will send message via electronically to review if earlier appt can be established to [facility].   Reason for Disposition  MODERATE anxiety (e.g., persistent or frequent anxiety symptoms; interferes with sleep, school, or work)  Answer Assessment - Initial Assessment Questions 1. CONCERN: "Did anything happen that prompted you to call today?"      Requesting new patient appt due to anxiety becoming more often 2. ANXIETY SYMPTOMS: "Can you describe how you (your loved one; patient) have been feeling?" (e.g., tense, restless, panicky, anxious, keyed up, overwhelmed, sense of impending doom).      Overwhelmed nausea  3. ONSET: "How long have you been feeling this way?" (e.g., hours, days, weeks)     2 months 4. SEVERITY: "How would you rate the level of anxiety?" (e.g., 0 - 10; or mild, moderate, severe).    No anxiety at this time since taking medication  5. FUNCTIONAL IMPAIRMENT: "How have these feelings affected your ability to do daily activities?" "Have you had more difficulty than usual doing your normal daily activities?" (e.g., getting better, same, worse; self-care, school,  work, interactions)     Had to leave job due to anxiety episodes 6. HISTORY: "Have you felt this way before?" "Have you ever been diagnosed with an anxiety problem in the past?" (e.g., generalized anxiety disorder, panic attacks, PTSD). If Yes, ask: "How was this problem treated?" (e.g., medicines, counseling, etc.)     Yes  7. RISK OF HARM - SUICIDAL IDEATION: "Do you ever have thoughts of hurting or killing yourself?" If Yes, ask:  "Do you have these feelings now?" "Do you have a plan on how you would do this?"     no 8. TREATMENT:  "What has been done so far to treat this anxiety?" (e.g., medicines, relaxation strategies). "What has helped?"     Medications -  9. TREATMENT - THERAPIST: "Do you have a counselor or therapist? Name?"     na 10. POTENTIAL TRIGGERS: "Do you drink caffeinated beverages (e.g., coffee, colas, teas), and how much daily?" "Do you drink alcohol or use any drugs?" "Have you started any new medicines recently?"       na 11. PATIENT SUPPORT: "Who is with you now?" "Who do you live with?" "Do you have family or friends who you can talk to?"        Few  12. OTHER SYMPTOMS: "Do you have any other symptoms?" (e.g., feeling depressed, trouble concentrating, trouble sleeping, trouble breathing, palpitations or fast heartbeat, chest pain, sweating, nausea, or diarrhea)       No anxiety now. With episodes feels chest discomfort, sweating , nausea, heart racing.  13. PREGNANCY: "Is there any chance you are pregnant?" "When was your last menstrual period?"       na  Protocols used: Anxiety and Panic Attack-A-AH

## 2022-03-26 ENCOUNTER — Other Ambulatory Visit: Payer: Self-pay | Admitting: Physician Assistant

## 2022-03-31 ENCOUNTER — Other Ambulatory Visit: Payer: Self-pay | Admitting: Physician Assistant

## 2022-05-05 ENCOUNTER — Telehealth: Payer: Medicaid Other | Admitting: Family Medicine

## 2022-05-05 DIAGNOSIS — N760 Acute vaginitis: Secondary | ICD-10-CM | POA: Diagnosis not present

## 2022-05-05 DIAGNOSIS — B9689 Other specified bacterial agents as the cause of diseases classified elsewhere: Secondary | ICD-10-CM

## 2022-05-05 MED ORDER — METRONIDAZOLE 500 MG PO TABS: 500.0000 mg | ORAL_TABLET | Freq: Two times a day (BID) | ORAL | 0 refills | Status: AC

## 2022-05-05 NOTE — Progress Notes (Signed)

## 2022-05-25 ENCOUNTER — Other Ambulatory Visit: Payer: Self-pay | Admitting: Physician Assistant

## 2022-05-26 ENCOUNTER — Telehealth: Payer: 59 | Admitting: Family Medicine

## 2022-05-26 ENCOUNTER — Telehealth: Payer: 59 | Admitting: Physician Assistant

## 2022-05-26 DIAGNOSIS — F411 Generalized anxiety disorder: Secondary | ICD-10-CM

## 2022-05-26 MED ORDER — HYDROXYZINE HCL 10 MG PO TABS: 10.0000 mg | ORAL_TABLET | Freq: Three times a day (TID) | ORAL | 0 refills | Status: DC | PRN

## 2022-05-26 NOTE — Progress Notes (Signed)
   Thank you for the details you included in the comment boxes. Those details are very helpful in determining the best course of treatment for you and help [place] to provide the best care.Because we cannot prescribe anxiety medications via e-visit alone (requires at least a face-to-face video visit), we recommend that you convert this visit to a video visit in order for the provider to better assess what is going on.  The provider will be able to give you a more accurate diagnosis and treatment plan if we can more freely discuss your symptoms and with the addition of a virtual examination.   If you convert to a video visit, we will bill your insurance (similar to an office visit) and you will not be charged for this e-Visit. You will be able to stay at home and speak with the first available [facility] advanced practice provider. The link to do a video visit is in the drop down Menu tab of your Welcome screen in MyChart.

## 2022-05-26 NOTE — Patient Instructions (Signed)
[name], thank you for joining Freddy Finner, NP for today's virtual visit.  While this provider is not your primary care provider (PCP), if your PCP is located in our provider database this encounter information will be shared with them immediately following your visit.   A [facility] MyChart account gives you access to today's visit and all your visits, tests, and labs performed at [facility] " click here if you don't have a [facility] MyChart account or go to [email or web address]  Consent: (Patient) Diamond Sutton provided verbal consent for this virtual visit at the beginning of the encounter.  Current Medications:  Current Outpatient Medications:    fluticasone (FLONASE) 50 MCG/ACT nasal spray, Place 2 sprays into both nostrils daily., Disp: 16 g, Rfl: 6   hydrOXYzine (ATARAX) 10 MG tablet, Take 1 tablet (10 mg total) by mouth 3 (three) times daily as needed., Disp: 30 tablet, Rfl: 0   Medications ordered in this encounter:  Meds ordered this encounter  Medications   hydrOXYzine (ATARAX) 10 MG tablet    Sig: Take 1 tablet (10 mg total) by mouth 3 (three) times daily as needed.    Dispense:  30 tablet    Refill:  0    Order Specific Question:   Supervising Provider    Answer:   Merrilee Jansky [id number]     *If you need refills on other medications prior to your next appointment, please contact your pharmacy*  Follow-Up: Call back or seek an in-person evaluation if the symptoms worsen or if the condition fails to improve as anticipated.  [facility] [phone]  Other Instructions  Managing Anxiety, Adult After being diagnosed with anxiety, you may be relieved to know why you have felt or behaved a certain way. You may also feel overwhelmed about the treatment ahead and what it will mean for your life. With care and support, you can manage your anxiety. How to manage lifestyle changes Understanding the difference between  stress and anxiety Although stress can play a role in anxiety, it is not the same as anxiety. Stress is your body's reaction to life changes and events, both good and bad. Stress is often caused by something external, such as a deadline, test, or competition. It normally goes away after the event has ended and will last just a few hours. But, stress can be ongoing and can lead to more than just stress. Anxiety is caused by something internal, such as imagining a terrible outcome or worrying that something will go wrong that will greatly upset you. Anxiety often does not go away even after the event is over, and it can become a long-term (chronic) worry. Lowering stress and anxiety Talk with your health care provider or a counselor to learn more about lowering anxiety and stress. They may suggest tension-reduction techniques, such as: Music. Spend time creating or listening to music that you enjoy and that inspires you. Mindfulness-based meditation. Practice being aware of your normal breaths while not trying to control your breathing. It can be done while sitting or walking. Centering prayer. Focus on a word, phrase, or sacred image that means something to you and brings you peace. Deep breathing. Expand your stomach and inhale slowly through your nose. Hold your breath for 3-5 seconds. Then breathe out slowly, letting your stomach muscles relax. Self-talk. Learn to notice and spot thought patterns that lead to anxiety reactions. Change those patterns to thoughts that feel peaceful. Muscle relaxation. Take time  to tense muscles and then relax them. Choose a tension-reduction technique that fits your lifestyle and personality. These techniques take time and practice. Set aside 5-15 minutes a day to do them. Specialized therapists can offer counseling and training in these techniques. The training to help with anxiety may be covered by some insurance plans. Other things you can do to manage stress and  anxiety include: Keeping a stress diary. This can help you learn what triggers your reaction and then learn ways to manage your response. Thinking about how you react to certain situations. You may not be able to control everything, but you can control your response. Making time for activities that help you relax and not feeling guilty about spending your time in this way. Doing visual imagery. This involves imagining or creating mental pictures to help you relax. Practicing yoga. Through yoga poses, you can lower tension and relax.  Medicines Medicines for anxiety include: Antidepressant medicines. These are usually prescribed for long-term daily control. Anti-anxiety medicines. These may be added in severe cases, especially when panic attacks occur. When used together, medicines, psychotherapy, and tension-reduction techniques may be the most effective treatment. Relationships Relationships can play a big part in helping you recover. Spend more time connecting with trusted friends and family members. Think about going to couples counseling if you have a partner, taking family education classes, or going to family therapy. Therapy can help you and others better understand your anxiety. How to recognize changes in your anxiety Everyone responds differently to treatment for anxiety. Recovery from anxiety happens when symptoms lessen and stop interfering with your daily life at home or work. This may mean that you will start to: Have better concentration and focus. Worry will interfere less in your daily thinking. Sleep better. Be less irritable. Have more energy. Have improved memory. Try to recognize when your condition is getting worse. Contact your provider if your symptoms interfere with home or work and you feel like your condition is not improving. Follow these instructions at home: Activity Exercise. Adults should: Exercise for at least 150 minutes each week. The exercise should  increase your heart rate and make you sweat (moderate-intensity exercise). Do strengthening exercises at least twice a week. Get the right amount and quality of sleep. Most adults need 7-9 hours of sleep each night. Lifestyle  Eat a healthy diet that includes plenty of vegetables, fruits, whole grains, low-fat dairy products, and lean protein. Do not eat a lot of foods that are high in fats, added sugars, or salt (sodium). Make choices that simplify your life. Do not use any products that contain nicotine or tobacco. These products include cigarettes, chewing tobacco, and vaping devices, such as e-cigarettes. If you need help quitting, ask your provider. Avoid caffeine, alcohol, and certain over-the-counter cold medicines. These may make you feel worse. Ask your pharmacist which medicines to avoid. General instructions Take over-the-counter and prescription medicines only as told by your provider. Keep all follow-up visits. This is to make sure you are managing your anxiety well or if you need more support. Where to find support You can get help and support from: Self-help groups. Online and [employer]. A trusted spiritual leader. Couples counseling. Family education classes. Family therapy. Where to find more information You may find that joining a support group helps you deal with your anxiety. The following sources can help you find counselors or support groups near you: Mental Health America: mentalhealthamerica.net Anxiety and Depression Association of [place] (ADAA): adaa[employer]  on Mental Illness (NAMI): nami.org Contact a health care provider if: You have a hard time staying focused or finishing tasks. You spend many hours a day feeling worried about everyday life. You are very tired because you cannot stop worrying. You start to have headaches or often feel tense. You have chronic nausea or diarrhea. Get help right away if: Your heart feels like it  is racing. You have shortness of breath. You have thoughts of hurting yourself or others. Get help right away if you feel like you may hurt yourself or others, or have thoughts about taking your own life. Go to your nearest emergency room or: Call 911. Call the National Suicide Prevention Lifeline at (628)120-2931 or 988. This is open 24 hours a day. Text the Crisis Text Line at (407) 819-5646. This information is not intended to replace advice given to you by your health care provider. Make sure you discuss any questions you have with your health care provider. Document Revised: 10/14/2021 Document Reviewed: 04/28/2020 Elsevier Patient Education  2023 Elsevier Inc.   If you have been instructed to have an in-person evaluation today at a local Urgent Care facility, please use the link below. It will take you to a list of all of our available [facility], including address, phone number and hours of operation. Please do not delay care.  [facility]  If you or a family member do not have a primary care provider, use the link below to schedule a visit and establish care. When you choose a [facility] primary care physician or advanced practice provider, you gain a long-term partner in health. Find a Primary Care Provider  Learn more about [facility]'s in-office and virtual care options:  - Get Care Now
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
## 2022-05-26 NOTE — Progress Notes (Signed)
The patient no-showed for appointment despite this provider sending direct link, reaching out via phone with no response and waiting for at least 10 minutes from appointment time for patient to join. They will be marked as a NS for this appointment/time.   Diamond Claud M Taliana Mersereau, NP    

## 2022-07-30 ENCOUNTER — Telehealth: Payer: 59 | Admitting: Physician Assistant

## 2022-07-30 DIAGNOSIS — B9689 Other specified bacterial agents as the cause of diseases classified elsewhere: Secondary | ICD-10-CM

## 2022-07-30 MED ORDER — CLINDAMYCIN HCL 300 MG PO CAPS: 300.0000 mg | ORAL_CAPSULE | Freq: Two times a day (BID) | ORAL | 0 refills | Status: DC

## 2022-07-30 NOTE — Progress Notes (Signed)

## 2022-08-03 ENCOUNTER — Telehealth: Payer: 59 | Admitting: Physician Assistant

## 2022-08-03 ENCOUNTER — Telehealth: Payer: 59 | Admitting: Family Medicine

## 2022-08-03 DIAGNOSIS — F419 Anxiety disorder, unspecified: Secondary | ICD-10-CM

## 2022-08-03 NOTE — Progress Notes (Signed)
Diamond Sutton   Needs to be seen in person or by PCP for medication adjustment and or change.

## 2022-08-04 ENCOUNTER — Encounter: Payer: Self-pay | Admitting: Physician Assistant

## 2022-08-04 ENCOUNTER — Ambulatory Visit: Payer: Self-pay

## 2022-08-04 ENCOUNTER — Other Ambulatory Visit (INDEPENDENT_AMBULATORY_CARE_PROVIDER_SITE_OTHER): Payer: Self-pay | Admitting: Family Medicine

## 2022-08-04 ENCOUNTER — Encounter: Payer: Self-pay | Admitting: Family Medicine

## 2022-08-04 ENCOUNTER — Telehealth: Payer: Self-pay | Admitting: Licensed Clinical Social Worker

## 2022-08-04 DIAGNOSIS — F411 Generalized anxiety disorder: Secondary | ICD-10-CM

## 2022-08-04 NOTE — Telephone Encounter (Signed)
LCSW received message from clerical staff with [facility] that patient was looking for mental health services. LCSW called and spoke with patient, patient reported that this message was left before talking with LCSW earlier.

## 2022-08-04 NOTE — Telephone Encounter (Signed)
Patient called requesting info about therapy and medication management. LCSW provided information about [employer] and also encouraged her to contact her insurance provider to find out all her options for both therapy and med management and encouraged her to call LCSW back if she was not able to find therapy. Patient voiced appreciation for information.

## 2022-08-10 ENCOUNTER — Telehealth: Payer: 59 | Admitting: Nurse Practitioner

## 2022-08-10 DIAGNOSIS — F419 Anxiety disorder, unspecified: Secondary | ICD-10-CM

## 2022-08-10 NOTE — Progress Notes (Signed)
I feel your condition warrants further evaluation and I recommend that you be seen in a face to face visit.   NOTE: There will be NO CHARGE for this eVisit   If you are having a true medical emergency please call 911.      For an urgent face to face visit, [facility] has eight urgent care centers for your convenience:   NEW!! [facility] [place] Get Driving Directions [phone] [address] [place], [address]    [facility] [place] Get Driving Directions [phone] [address] [place], [place] [address]   [facility] ([place]) Get Driving Directions [phone] [address] [place], [place] [address]  [facility] ([place]) Get Driving Directions [phone] [address] [place],  [place]  [address]  [facility] ([place] [facility] [place] Get Driving Directions  [phone] [address] [place],  [place] [address]   [facility] Get Driving Directions [phone] [address] [place], [place] [address]   [facility] [place] Get Driving Directions  [phone] [address] [place], [place] [address]   [facility] [place] Get Driving Directions [phone] [address] [place], [place] [address]  Your MyChart E-visit questionnaire answers were reviewed by a board certified advanced clinical practitioner to complete your personal care plan based on your specific symptoms.  Thank you for using e-Visits.    

## 2022-08-11 ENCOUNTER — Ambulatory Visit: Payer: 59

## 2022-08-19 ENCOUNTER — Ambulatory Visit: Payer: 59

## 2022-08-19 ENCOUNTER — Other Ambulatory Visit: Payer: Self-pay | Admitting: Nurse Practitioner

## 2022-08-19 ENCOUNTER — Telehealth: Payer: 59 | Admitting: Nurse Practitioner

## 2022-08-19 DIAGNOSIS — N760 Acute vaginitis: Secondary | ICD-10-CM

## 2022-08-19 DIAGNOSIS — N761 Subacute and chronic vaginitis: Secondary | ICD-10-CM

## 2022-08-19 MED ORDER — CLINDAMYCIN HCL 300 MG PO CAPS: 300.0000 mg | ORAL_CAPSULE | Freq: Two times a day (BID) | ORAL | 0 refills | Status: DC

## 2022-08-19 NOTE — Progress Notes (Signed)
Refilled lost Rx (Clindamycin) at patient's request.  Stated she left her prescription on vacation and only took 2 doses  Symptoms continue  See visit from 07/30/22  Meds ordered this encounter  Medications   clindamycin (CLEOCIN) 300 MG capsule    Sig: Take 1 capsule (300 mg total) by mouth in the morning and at bedtime.    Dispense:  14 capsule    Refill:  0

## 2022-08-19 NOTE — Progress Notes (Signed)
Requesting medication replacement for lost medication.  No record of E-visit in the past month prescribing medication for BV Requested patient contact treating office for correct medication.  No recent Rx listed in medication reconciliation

## 2022-08-20 ENCOUNTER — Ambulatory Visit: Payer: 59

## 2022-08-24 ENCOUNTER — Ambulatory Visit: Payer: 59

## 2022-08-25 ENCOUNTER — Ambulatory Visit: Payer: Medicaid Other | Admitting: Family Medicine

## 2022-09-08 ENCOUNTER — Encounter: Payer: Self-pay | Admitting: Family Medicine

## 2022-09-08 ENCOUNTER — Ambulatory Visit (INDEPENDENT_AMBULATORY_CARE_PROVIDER_SITE_OTHER): Payer: 59 | Admitting: Family Medicine

## 2022-09-08 VITALS — BP 136/82 | HR 73 | Ht 66.0 in | Wt 169.0 lb

## 2022-09-08 DIAGNOSIS — Z7689 Persons encountering health services in other specified circumstances: Secondary | ICD-10-CM | POA: Diagnosis not present

## 2022-09-08 DIAGNOSIS — F5104 Psychophysiologic insomnia: Secondary | ICD-10-CM | POA: Diagnosis not present

## 2022-09-08 DIAGNOSIS — F411 Generalized anxiety disorder: Secondary | ICD-10-CM

## 2022-09-08 DIAGNOSIS — F410 Panic disorder [episodic paroxysmal anxiety] without agoraphobia: Secondary | ICD-10-CM

## 2022-09-08 DIAGNOSIS — F331 Major depressive disorder, recurrent, moderate: Secondary | ICD-10-CM

## 2022-09-08 MED ORDER — BUSPIRONE HCL 5 MG PO TABS: 5.0000 mg | ORAL_TABLET | Freq: Two times a day (BID) | ORAL | 2 refills | Status: DC | PRN

## 2022-09-08 MED ORDER — ESCITALOPRAM OXALATE 10 MG PO TABS: 10.0000 mg | ORAL_TABLET | Freq: Every day | ORAL | 2 refills | Status: DC

## 2022-09-08 MED ORDER — TRAZODONE HCL 50 MG PO TABS: 25.0000 mg | ORAL_TABLET | Freq: Every evening | ORAL | 2 refills | Status: DC | PRN

## 2022-09-08 NOTE — Patient Instructions (Addendum)
Thank you for coming to the office today.  For the Mood and Anxiety Start Escitalopram (lexapro) 10mg  daily with meal, may take 2-4 weeks to take effect. It should help all of these issues. We can adjust dose next time if needed to 20mg .  For anxiety only Take Buspar (buspirone) 5mg  tablet, take one if needed for anxiety or panic attack, it isn't instant. It can be taken once every day, and then save the other pill for if you truly need it, with panic or something else. It only lasts 4-8 hours per pill, so plan ahead what time of day you prefer to take it.  For insomnia sleep Start taking Trazodone 50mg , this is a mood medicine that fixes sleep. It can be taken every night or can be skipped and taken intermittently. I would suggest, hold this one for about 3-7 days first for the other medicines to take effect, then you can add this to the mix. If you prefer to take nightly for now and then phase down on it that is fine.   These offices have both PSYCHIATRY doctors and THERAPISTS  [facility] (Virtual Available) [place] [address] [place], [place] [address] Phone: [phone]  [facility] Address: [address] [place], [place] [address] [email or web address] Phone: [phone]  [facility]) Address: [address], [place], [place] [address] Hours: 8:30AM-5PM Phone: [phone]  [facility] (Adult, Peds, Geriatric, Counseling) [address] [place], [place] [address] Phone: [phone] Fax: [phone]  [facility] at [facility] [address] [place], [place] [address] Phone: [phone]  [facility] (All ages) [address] Ervin Knack [employer] [place], [id number] Phone: [phone] (Option  1) [email or web address]  ----------------------------------------------------------------- THERAPIST ONLY  (No Psychiatry)  [facility] [address] [place], [place] [address] [place] P: [phone]  [facility]) [facility] [address] [place], [place] [address] [phone]  [facility]   Address: [address] [place], [place] [address] Hours: Open today  9AM-7PM Phone: [phone]  [facility] [address] [facility]  - [facility] Address: [address] [place], [place] [address] Phone: [phone]  [facility] [place], [place] [address] Phone: [phone]  --------------------------------------------------------------  Sleep Hygiene Recommendations to promote healthy sleep in all patients, especially if symptoms of insomnia are worsening. Due to the nature of sleep rhythms, if your body gets "out of rhythm", it may take some time before your sleep cycle can be "reset".  Please try to follow as many of the following tips as you can, usually there are only a few of these are the primary cause of the problem.  ?To reset your sleep rhythm, go to bed and get up at the same time every day ?Sleep only long enough to feel rested and then get out of bed ?Do not try to force yourself to sleep. If you can't sleep, get out of bed and try again later. ?Avoid naps during the day, unless excessively tired. The more sleeping during the day, then the less sleep your body needs at night.  ?Have coffee, tea, and other foods that have caffeine only in the morning ?Exercise several days a week, but not right before bed ?If you drink alcohol, prefer to have appropriate drink with one meal, but prefer to avoid  alcohol in the evening, and bedtime ?If you smoke, avoid smoking, especially in the evening  ?Avoid watching TV or looking at phones, computers, or reading devices  ("e-books") that give off light at least 30 minutes before bed. This artificial light sends "awake signals" to your brain and can make it harder to fall asleep. ?Make your bedroom a comfortable place where it is easy to fall asleep: Put up shades or special blackout curtains to block light from outside. Use a white noise machine to block noise. Keep the temperature cool. ?Try your best to solve or at least address your problems before you go to bed ?Use relaxation techniques to manage stress. Ask your health care provider to suggest some techniques that may work well for you. These may include: Breathing exercises. Routines to release muscle tension. Visualizing peaceful scenes.    Please schedule a Follow-up Appointment to: Return in about 4 weeks (around 10/06/2022) for 4-6 week follow-up Mood, Anxiety, Insomnia med adjust.  If you have any other questions or concerns, please feel free to call the office or send a message through MyChart. You may also schedule an earlier appointment if necessary.  Additionally, you may be receiving a survey about your experience at our office within a few days to 1 week by e-mail or mail. We value your feedback.  Saralyn Pilar, DO [facility], [place]

## 2022-09-08 NOTE — Progress Notes (Signed)
Subjective:    Patient ID: Diamond Sutton, female    DOB: April 25, 1985, 37 y.o.   MRN: [id number]  Diamond Sutton is a 37 y.o. female presenting on 09/08/2022 for New Patient (Initial Visit) and Hypertension  She is establishing care with new PCP, previously with [facility]  HPI  Generalized Anxiety Disorder with Panic Attacks Major Depression recurrent, moderate active Insomnia  Previously working for [employer] but no longer employed. 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 Her father has moved in with her. - She has been triaged and seen by virtual providers while waiting to establish and given trial on Hydroxyzine 10mg  AS NEEDED for anxiety without good success. - She has tried Tylenol PM temporary, help her sleep 4 hours approx only. She can feel groggy next day. - History of Postpartum Depression. Not diagnosed previously but still has. - She lost her mother and brother passed similar time 2018 approx, 2 brothers passed in 2020. - She has episodes of panic that occur and can flare up with shortness of breath cannot breath, nauseas, sweating, chest pressure      09/08/2022    2:50 PM 07/18/2019    4:02 PM 03/29/2019    4:44 PM  Depression screen PHQ 2/9  Decreased Interest 2 0 0  Down, Depressed, Hopeless 2 0 0  PHQ - 2 Score 4 0 0  Altered sleeping 3 0   Tired, decreased energy 2 0   Change in appetite 2 0   Feeling bad or failure about yourself  2 0   Trouble concentrating 1 0   Moving slowly or fidgety/restless 0 0   Suicidal thoughts 0 0   PHQ-9 Score 14 0   Difficult doing work/chores Somewhat difficult        09/08/2022    2:51 PM 05/26/2022    3:21 PM  GAD 7 : Generalized Anxiety Score  Nervous, Anxious, on Edge 3 2  Control/stop worrying 3 2  Worry too much - different things 3 2  Trouble relaxing 3 1  Restless 3 2  Easily annoyed or irritable 1 2  Afraid -  awful might happen 3 1  Total GAD 7 Score 19 12  Anxiety Difficulty Somewhat difficult Very difficult      Past Medical History:  Diagnosis Date   Anemia    Bacterial vaginal infection    Depression 2001   Ectopic pregnancy    History of urinary tract infection    Hypertension    Ovarian cyst    Past Surgical History:  Procedure Laterality Date   DILATION AND CURETTAGE OF UTERUS     SAB   TUBAL LIGATION N/A 05/09/2019   Procedure: POST PARTUM TUBAL LIGATION;  Surgeon: Hildred Laser, MD;  Location: [facility];  Service: Gynecology;  Laterality: N/A;   Social History   Socioeconomic History   Marital status: Single    Spouse name: Not on file   Number of children: 8   Years of education: Not on file   Highest education level: Not on file  Occupational History   Not on file  Tobacco Use   Smoking status: Every Day    Current packs/day: 0.05    Average packs/day: 0.1 packs/day for 15.0 years (0.8 ttl pk-yrs)    Types: Cigarettes   Smokeless tobacco: Never  Vaping Use   Vaping status: Never Used  Substance and  Sexual Activity   Alcohol use: Yes    Alcohol/week: 6.0 standard drinks of alcohol    Types: 4 Shots of liquor, 2 Standard drinks or equivalent per week    Comment: ocassional   Drug use: Never   Sexual activity: Yes    Partners: Male    Birth control/protection: Surgical  Other Topics Concern   Not on file  Social History Narrative   Not on file   Social Determinants of Health   Financial Resource Strain: Not on file  Food Insecurity: Not on file  Transportation Needs: Not on file  Physical Activity: Not on file  Stress: Not on file  Social Connections: Not on file  Intimate Partner Violence: Not At Risk (01/07/2021)   Humiliation, Afraid, Rape, and Kick questionnaire    Fear of Current or Ex-Partner: No    Emotionally Abused: No    Physically Abused: No    Sexually Abused: No   Family History  Problem Relation Age of Onset   Diabetes Mother     Hypertension Mother    Heart murmur Half-Brother    Breast cancer Neg Hx    Ovarian cancer Neg Hx    Colon cancer Neg Hx    Heart disease Neg Hx    Current Outpatient Medications on File Prior to Visit  Medication Sig   fluticasone (FLONASE) 50 MCG/ACT nasal spray Place 2 sprays into both nostrils daily.   hydrOXYzine (ATARAX) 10 MG tablet Take 1 tablet (10 mg total) by mouth 3 (three) times daily as needed.   No current facility-administered medications on file prior to visit.    Review of Systems Per HPI unless specifically indicated above     Objective:    BP 136/82   Pulse 73   Ht 5\' 6"  (1.676 m)   Wt 169 lb (76.7 kg)   LMP 09/06/2022   SpO2 97%   BMI 27.28 kg/m   Wt Readings from Last 3 Encounters:  09/08/22 169 lb (76.7 kg)  02/04/22 165 lb (74.8 kg)  11/28/21 154 lb 15.7 oz (70.3 kg)    Physical Exam Vitals and nursing note reviewed.  Constitutional:      General: She is not in acute distress.    Appearance: Normal appearance. She is well-developed. She is not diaphoretic.     Comments: Well-appearing, comfortable, cooperative  HENT:     Head: Normocephalic and atraumatic.  Eyes:     General:        Right eye: No discharge.        Left eye: No discharge.     Conjunctiva/sclera: Conjunctivae normal.  Cardiovascular:     Rate and Rhythm: Normal rate.  Pulmonary:     Effort: Pulmonary effort is normal.  Skin:    General: Skin is warm and dry.     Findings: No erythema or rash.  Neurological:     Mental Status: She is alert and oriented to person, place, and time.  Psychiatric:        Mood and Affect: Mood normal.        Behavior: Behavior normal.        Thought Content: Thought content normal.     Comments: Well groomed, good eye contact, normal speech and thoughts. Mild anxious      Results for orders placed or performed during the hospital encounter of 02/27/22  Resp panel by RT-PCR (RSV, Flu A&B, Covid) Anterior Nasal Swab   Specimen: Anterior  Nasal Swab  Result Value Ref  Range   SARS Coronavirus 2 by RT PCR NEGATIVE NEGATIVE   Influenza A by PCR NEGATIVE NEGATIVE   Influenza B by PCR NEGATIVE NEGATIVE   Resp Syncytial Virus by PCR NEGATIVE NEGATIVE      Assessment & Plan:   Problem List Items Addressed This Visit     Generalized anxiety disorder with panic attacks - Primary   Relevant Medications   busPIRone (BUSPAR) 5 MG tablet   traZODone (DESYREL) 50 MG tablet   escitalopram (LEXAPRO) 10 MG tablet   Major depressive disorder, recurrent, moderate (HCC)   Relevant Medications   busPIRone (BUSPAR) 5 MG tablet   traZODone (DESYREL) 50 MG tablet   escitalopram (LEXAPRO) 10 MG tablet   Psychophysiologic insomnia   Relevant Medications   traZODone (DESYREL) 50 MG tablet   escitalopram (LEXAPRO) 10 MG tablet   Other Visit Diagnoses     Encounter to establish care with new doctor          Establish care with new PCP  Major depression recurrent moderate No prior treatment on SSRI Not w/ Psych or formal diagnosis recently History not consistent with mania or bipolar  Start Escitalopram (lexapro) 10mg  daily with meal, may take 2-4 weeks to take effect. It should help all of these issues. We can adjust dose next time if needed to 20mg .  Generalized Anxiety w/ Panic  Take Buspar (buspirone) 5mg  tablet, take one if needed for anxiety or panic attack, it isn't instant. It can be taken once every day, and then save the other pill for if you truly need it, with panic or something else. It only lasts 4-8 hours per pill, so plan ahead what time of day you prefer to take it.  For insomnia sleep, seems secondary to the above MDD GAD  Start taking Trazodone 50mg , this is a mood medicine that fixes sleep. It can be taken every night or can be skipped and taken intermittently. I would suggest, hold this one for about 3-7 days first for the other medicines to take effect, then you can add this to the mix. If you prefer to take  nightly for now and then phase down on it that is fine.   Meds ordered this encounter  Medications   busPIRone (BUSPAR) 5 MG tablet    Sig: Take 1 tablet (5 mg total) by mouth 2 (two) times daily as needed (anxiety).    Dispense:  60 tablet    Refill:  2   traZODone (DESYREL) 50 MG tablet    Sig: Take 0.5-1 tablets (25-50 mg total) by mouth at bedtime as needed for sleep.    Dispense:  30 tablet    Refill:  2   escitalopram (LEXAPRO) 10 MG tablet    Sig: Take 1 tablet (10 mg total) by mouth daily.    Dispense:  30 tablet    Refill:  2      Follow up plan: Return in about 4 weeks (around 10/06/2022) for 4-6 week follow-up Mood, Anxiety, Insomnia med adjust.  Saralyn Pilar, DO [facility] 09/08/2022, 2:53 PM

## 2022-09-30 ENCOUNTER — Ambulatory Visit: Payer: 59 | Admitting: Family Medicine

## 2022-10-20 ENCOUNTER — Ambulatory Visit (INDEPENDENT_AMBULATORY_CARE_PROVIDER_SITE_OTHER): Payer: 59 | Admitting: Family Medicine

## 2022-10-20 VITALS — BP 130/80 | HR 101 | Ht 66.0 in | Wt 171.0 lb

## 2022-10-20 DIAGNOSIS — F331 Major depressive disorder, recurrent, moderate: Secondary | ICD-10-CM | POA: Diagnosis not present

## 2022-10-20 DIAGNOSIS — F411 Generalized anxiety disorder: Secondary | ICD-10-CM | POA: Diagnosis not present

## 2022-10-20 DIAGNOSIS — N760 Acute vaginitis: Secondary | ICD-10-CM

## 2022-10-20 DIAGNOSIS — F5104 Psychophysiologic insomnia: Secondary | ICD-10-CM

## 2022-10-20 DIAGNOSIS — Z124 Encounter for screening for malignant neoplasm of cervix: Secondary | ICD-10-CM

## 2022-10-20 DIAGNOSIS — B9689 Other specified bacterial agents as the cause of diseases classified elsewhere: Secondary | ICD-10-CM | POA: Diagnosis not present

## 2022-10-20 DIAGNOSIS — F410 Panic disorder [episodic paroxysmal anxiety] without agoraphobia: Secondary | ICD-10-CM

## 2022-10-20 MED ORDER — CLINDAMYCIN HCL 300 MG PO CAPS: 300.0000 mg | ORAL_CAPSULE | Freq: Two times a day (BID) | ORAL | 2 refills | Status: DC

## 2022-10-20 MED ORDER — CLONAZEPAM 0.5 MG PO TABS: 0.2500 mg | ORAL_TABLET | Freq: Two times a day (BID) | ORAL | 2 refills | Status: DC | PRN

## 2022-10-20 MED ORDER — ESCITALOPRAM OXALATE 20 MG PO TABS: 20.0000 mg | ORAL_TABLET | Freq: Every day | ORAL | 2 refills | Status: DC

## 2022-10-20 NOTE — Progress Notes (Signed)
Subjective:    Patient ID: Diamond Sutton, female    DOB: [birth year], 37 y.o.   MRN: [id number]  Diamond Sutton is a 37 y.o. female presenting on 10/20/2022 for Mood and Anxiety   HPI  Discussed the use of AI scribe software for clinical note transcription with the patient, who gave verbal consent to proceed.    Generalized Anxiety Disorder with Panic Attacks Major Depression recurrent, moderate active Insomnia   Last visit 09/08/22, reviewed her history with regards to mental health conditions. Previously working for [employer] but no longer employed. 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 Her father has moved in with her. - History of Postpartum Depression. Not diagnosed previously but still has. - She lost her mother and brother passed similar time 2018 approx, 2 brothers passed in 2020. - She has episodes of panic that occur and can flare up with shortness of breath cannot breath, nauseas, sweating, chest pressure  She was initiated on SSRI Escitalopram 10mg  daily + Buspar 5mg  TWICE A DAY AS NEEDED anxiety.  Today she reports no significant improvement despite adherence to prescribed Lexapro and Buspar. The patient describes persistent daily anxiety, which has been exacerbated by a new part-time job, leading to frequent panic attacks at work. Despite taking Buspar twice daily, the patient reports no noticeable relief.  In addition to anxiety and panic attacks, the patient reports sleep disturbances. The patient has been taking Trazodone for sleep, but reports feeling groggy and tired the following day, leading to a desire to discontinue this medication.  The patient has not previously tried other medications for anxiety and panic attacks, but is open to trying new treatments. The patient has a brother who had taken Klonopin, but the patient has not personally tried this medication. The  patient expresses a willingness to try Klonopin, but also expresses concern about potential dependence on this medication. The patient denies significant alcohol consumption, which could interact with Klonopin.  The patient has expressed interest in therapy and would like a referral to a mental health provider.  The patient also reports a history of recurrent bacterial vaginosis (BV), which they attribute to having their tubes tied. The patient describes needing treatment for BV approximately once a month. The patient has previously been treated with Metronidazole, but reports experiencing nausea and vomiting with this medication. The patient prefers Clindamycin for treatment of BV. - She previously had a Pap smear in 2020 and is due for another in 2025. The patient has expressed interest in seeing a gynecologist for ongoing issues with recurrent BV.       10/20/2022    3:57 PM 09/08/2022    2:50 PM 07/18/2019    4:02 PM  Depression screen PHQ 2/9  Decreased Interest 3 2 0  Down, Depressed, Hopeless 2 2 0  PHQ - 2 Score 5 4 0  Altered sleeping 3 3 0  Tired, decreased energy 3 2 0  Change in appetite 3 2 0  Feeling bad or failure about yourself  1 2 0  Trouble concentrating 2 1 0  Moving slowly or fidgety/restless 2 0 0  Suicidal thoughts 0 0 0  PHQ-9 Score 19 14 0  Difficult doing work/chores Somewhat difficult Somewhat difficult       10/20/2022    3:58 PM 09/08/2022    2:51 PM 05/26/2022    3:21 PM  GAD 7 : Generalized  Anxiety Score  Nervous, Anxious, on Edge 3 3 2   Control/stop worrying 3 3 2   Worry too much - different things 3 3 2   Trouble relaxing 2 3 1   Restless 1 3 2   Easily annoyed or irritable 3 1 2   Afraid - awful might happen 3 3 1   Total GAD 7 Score 18 19 12   Anxiety Difficulty  Somewhat difficult Very difficult      Social History   Tobacco Use   Smoking status: Every Day    Current packs/day: 0.05    Average packs/day: 0.1 packs/day for 15.0 years (0.8 ttl  pk-yrs)    Types: Cigarettes   Smokeless tobacco: Never  Vaping Use   Vaping status: Never Used  Substance Use Topics   Alcohol use: Yes    Alcohol/week: 6.0 standard drinks of alcohol    Types: 4 Shots of liquor, 2 Standard drinks or equivalent per week    Comment: ocassional   Drug use: Never    Review of Systems Per HPI unless specifically indicated above     Objective:    BP 130/80   Pulse (!) 101   Ht 5\' 6"  (1.676 m)   Wt 171 lb (77.6 kg)   SpO2 100%   BMI 27.60 kg/m   Wt Readings from Last 3 Encounters:  10/20/22 171 lb (77.6 kg)  09/08/22 169 lb (76.7 kg)  02/04/22 165 lb (74.8 kg)    Physical Exam Vitals and nursing note reviewed.  Constitutional:      General: She is not in acute distress.    Appearance: Normal appearance. She is well-developed. She is not diaphoretic.     Comments: Well-appearing, comfortable, cooperative  HENT:     Head: Normocephalic and atraumatic.  Eyes:     General:        Right eye: No discharge.        Left eye: No discharge.     Conjunctiva/sclera: Conjunctivae normal.  Cardiovascular:     Rate and Rhythm: Normal rate.  Pulmonary:     Effort: Pulmonary effort is normal.  Skin:    General: Skin is warm and dry.     Findings: No erythema or rash.  Neurological:     Mental Status: She is alert and oriented to person, place, and time.  Psychiatric:        Mood and Affect: Mood normal.        Behavior: Behavior normal.        Thought Content: Thought content normal.     Comments: Well groomed, good eye contact, normal speech and thoughts. Mildly Anxious today    Results for orders placed or performed during the hospital encounter of 02/27/22  Resp panel by RT-PCR (RSV, Flu A&B, Covid) Anterior Nasal Swab   Specimen: Anterior Nasal Swab  Result Value Ref Range   SARS Coronavirus 2 by RT PCR NEGATIVE NEGATIVE   Influenza A by PCR NEGATIVE NEGATIVE   Influenza B by PCR NEGATIVE NEGATIVE   Resp Syncytial Virus by PCR  NEGATIVE NEGATIVE      Assessment & Plan:   Problem List Items Addressed This Visit     Generalized anxiety disorder with panic attacks - Primary   Relevant Medications   escitalopram (LEXAPRO) 20 MG tablet   clonazePAM (KLONOPIN) 0.5 MG tablet   Major depressive disorder, recurrent, moderate (HCC)   Relevant Medications   escitalopram (LEXAPRO) 20 MG tablet   Psychophysiologic insomnia   Relevant Medications   escitalopram (LEXAPRO)  20 MG tablet   Other Visit Diagnoses     BV (bacterial vaginosis)       Relevant Medications   clindamycin (CLEOCIN) 300 MG capsule       Assessment and Plan    Anxiety and Panic Disorder Persistent symptoms despite daily use of Lexapro and Buspar. No noticeable improvement or side effects reported. Patient experiences daily panic attacks, impacting work performance.  -Increase Lexapro from 10mg  to 20mg  daily. -Discontinue Buspar due to lack of efficacy. -Initiate Clonazepam 0.5mg  tabs, can take HALF tab up to max WHOLE tab up to twice daily as needed, with caution regarding potential dependence BDZ and interaction with alcohol.  Consider referral to Therapist CBT for anxiety  Insomnia Trazodone causing next-day grogginess, impacting patient's quality of life. -Discontinue Trazodone.  -Consider Clonazepam for sleep as well, as it may help improve sleep. -Avoid new sleep agents while on this new treatment plan. Can reconsider sleep med once anxiety is stable.  Recurrent Bacterial Vaginosis Patient reports frequent episodes of BV since having tubes tied, requiring monthly treatment. -Prescribe Clindamycin 300mg  twice daily for 7 days, with two refills. Refer to GYN for pap smear + BV recurrent concerns.        Orders Placed This Encounter  Procedures   Ambulatory referral to Obstetrics / Gynecology    Referral Priority:   Routine    Referral Type:   Consultation    Referral Reason:   Specialty Services Required    Requested  Specialty:   Obstetrics and Gynecology    Number of Visits Requested:   1     Meds ordered this encounter  Medications   escitalopram (LEXAPRO) 20 MG tablet    Sig: Take 1 tablet (20 mg total) by mouth daily.    Dispense:  30 tablet    Refill:  2    Dose increase from 10 to 20mg    clonazePAM (KLONOPIN) 0.5 MG tablet    Sig: Take 0.5-1 tablets (0.25-0.5 mg total) by mouth 2 (two) times daily as needed for anxiety.    Dispense:  30 tablet    Refill:  2   clindamycin (CLEOCIN) 300 MG capsule    Sig: Take 1 capsule (300 mg total) by mouth 2 (two) times daily. For BV episode    Dispense:  14 capsule    Refill:  2      Follow up plan: Return in about 3 months (around 01/20/2023) for 3 month follow-up Anxiety, Mood, Sleep - meds (any day PM only).   Saralyn Pilar, DO [facility] 10/20/2022, 4:16 PM

## 2022-10-20 NOTE — Patient Instructions (Addendum)
Thank you for coming to the office today.  Discontinue Buspar for anxiety, since ineffective.  Dose increase Escitalopram (lexapro) from 10mg  daily to 20mg  daily, new rx sent. If after 4+ weeks still no benefit, message or call back and we can switch to Venlafaxine Effexor.  -------------------------  Add anxiety medicine Clonazepam (Klonopin) 0.5mg  tab you can take HALF or WHOLE tab, up to 1 to 2 times per day as needed for anxiety and panic.  It can work fairly quickly. And goal is to take it not every day but a few days as needed.  If you take it every day it can cause dependence, and require you to keep taking it or higher doses.  If you stop cold [place] it can cause withdrawal that can be dangerous.  -------------  Clonazepam can help improve sleep as well.  We can refer to Mental Health provider if you find a place in network that you like. Let me know message or call.   These offices have both PSYCHIATRY doctors and THERAPISTS   [facility] (Virtual Available) [place] [address] [place], [place] [address] Phone: [phone]   [facility] Address: [address] [place], [place] [address] [email or web address] Phone: [phone]   [facility]) Address: [address], [place], [place] [address] Hours: 8:30AM-5PM Phone: [phone]   [facility] (Adult, Peds, Geriatric, Counseling) [address] [place], [place] [address] Phone: [phone] Fax: [phone]   [facility] [address] [place], [place] [address] Phone: [phone]   [facility] (All ages) [address] [place], [id number] Phone: [phone] (Option 1) [email or web address]   ----------------------------------------------------------------- THERAPIST  ONLY  (No Psychiatry)   [facility] [address] [place], [place] [address] [place] P: [phone]   [facility]) [facility], [facility] [address] [place], [place] [address] [phone]   [facility]   Address: [address] [place], [place] [address] Hours: Open today  9AM-7PM Phone: [phone]   [facility] [address], [facility]  - [facility] Address: [address] [place], [place] [address] Phone: [phone]   [facility] [place], [place] [address] Phone: [phone]  Please schedule a Follow-up Appointment to: Return in about 3 months (around 01/20/2023) for 3 month follow-up Anxiety, Mood, Sleep - meds (any day PM only).  If you have any other questions or concerns, please feel free to call the office or send a message through MyChart. You may also schedule an earlier appointment if necessary.  Additionally, you may be receiving a survey about your experience at our office within a few days to 1 week by e-mail or mail. We value your feedback.  Saralyn Pilar, DO [facility], [place]

## 2022-10-24 ENCOUNTER — Telehealth: Payer: 59 | Admitting: Nurse Practitioner

## 2022-10-24 DIAGNOSIS — K529 Noninfective gastroenteritis and colitis, unspecified: Secondary | ICD-10-CM

## 2022-10-24 MED ORDER — ONDANSETRON HCL 4 MG PO TABS: 4.0000 mg | ORAL_TABLET | Freq: Three times a day (TID) | ORAL | 0 refills | Status: DC | PRN

## 2022-10-24 MED ORDER — LOPERAMIDE HCL 2 MG PO TABS: 2.0000 mg | ORAL_TABLET | Freq: Four times a day (QID) | ORAL | 0 refills | Status: DC | PRN
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
## 2022-10-24 NOTE — Patient Instructions (Signed)
  [name], thank you for joining Claiborne Rigg, NP for today's virtual visit.  While this provider is not your primary care provider (PCP), if your PCP is located in our provider database this encounter information will be shared with them immediately following your visit.   A [facility] MyChart account gives you access to today's visit and all your visits, tests, and labs performed at [facility] " click here if you don't have a [facility] MyChart account or go to [email or web address]  Consent: (Patient) Diamond Sutton provided verbal consent for this virtual visit at the beginning of the encounter.  Current Medications:  Current Outpatient Medications:    loperamide (IMODIUM A-D) 2 MG tablet, Take 1 tablet (2 mg total) by mouth 4 (four) times daily as needed for diarrhea or loose stools., Disp: 30 tablet, Rfl: 0   ondansetron (ZOFRAN) 4 MG tablet, Take 1 tablet (4 mg total) by mouth every 8 (eight) hours as needed for nausea or vomiting., Disp: 20 tablet, Rfl: 0   clindamycin (CLEOCIN) 300 MG capsule, Take 1 capsule (300 mg total) by mouth 2 (two) times daily. For BV episode, Disp: 14 capsule, Rfl: 2   clonazePAM (KLONOPIN) 0.5 MG tablet, Take 0.5-1 tablets (0.25-0.5 mg total) by mouth 2 (two) times daily as needed for anxiety., Disp: 30 tablet, Rfl: 2   escitalopram (LEXAPRO) 20 MG tablet, Take 1 tablet (20 mg total) by mouth daily., Disp: 30 tablet, Rfl: 2   fluticasone (FLONASE) 50 MCG/ACT nasal spray, Place 2 sprays into both nostrils daily., Disp: 16 g, Rfl: 6   Medications ordered in this encounter:  Meds ordered this encounter  Medications   loperamide (IMODIUM A-D) 2 MG tablet    Sig: Take 1 tablet (2 mg total) by mouth 4 (four) times daily as needed for diarrhea or loose stools.    Dispense:  30 tablet    Refill:  0    Order Specific Question:   Supervising Provider    Answer:   LAMPTEY, PHILIP O [[id number]]   ondansetron (ZOFRAN) 4 MG tablet     Sig: Take 1 tablet (4 mg total) by mouth every 8 (eight) hours as needed for nausea or vomiting.    Dispense:  20 tablet    Refill:  0    Order Specific Question:   Supervising Provider    Answer:   Merrilee Jansky [id number]     *If you need refills on other medications prior to your next appointment, please contact your pharmacy*  Follow-Up: Call back or seek an in-person evaluation if the symptoms worsen or if the condition fails to improve as anticipated.  [facility] [phone]    If you have been instructed to have an in-person evaluation today at a local Urgent Care facility, please use the link below. It will take you to a list of all of our available [facility], including address, phone number and hours of operation. Please do not delay care.  [facility]  If you or a family member do not have a primary care provider, use the link below to schedule a visit and establish care. When you choose a [facility] primary care physician or advanced practice provider, you gain a long-term partner in health. Find a Primary Care Provider  Learn more about [facility] in-office and virtual care options: [facility] - Get Care Now

## 2022-12-03 ENCOUNTER — Encounter: Payer: Self-pay | Admitting: Family Medicine

## 2022-12-03 DIAGNOSIS — F331 Major depressive disorder, recurrent, moderate: Secondary | ICD-10-CM

## 2022-12-03 DIAGNOSIS — F410 Panic disorder [episodic paroxysmal anxiety] without agoraphobia: Secondary | ICD-10-CM

## 2022-12-03 NOTE — Telephone Encounter (Signed)
Reviewed last note from 10/2022.  Buspirone discontinued at that time due to ineffectiveness.  She continues to take escitalopram and clonazepam.  I did not see any additional medications mentioned that they were considering trying.  Will defer to PCP upon his return

## 2022-12-08 MED ORDER — VENLAFAXINE HCL ER 37.5 MG PO CP24: 37.5000 mg | ORAL_CAPSULE | Freq: Every day | ORAL | 0 refills | Status: DC

## 2022-12-08 NOTE — Addendum Note (Signed)
Addended by: Smitty Cords on: 12/08/2022 06:03 PM   Modules accepted: Orders

## 2022-12-19 ENCOUNTER — Other Ambulatory Visit: Payer: Self-pay | Admitting: Family Medicine

## 2022-12-19 DIAGNOSIS — F331 Major depressive disorder, recurrent, moderate: Secondary | ICD-10-CM

## 2022-12-19 DIAGNOSIS — F410 Panic disorder [episodic paroxysmal anxiety] without agoraphobia: Secondary | ICD-10-CM

## 2022-12-22 NOTE — Telephone Encounter (Signed)
Requested medication (s) are due for refill today: yes  Requested medication (s) are on the active medication list: yes  Last refill:  12/08/22 #30/0  Future visit scheduled: yes  Notes to clinic:  Unable to refill per protocol due to failed labs, no updated results.      Requested Prescriptions  Pending Prescriptions Disp Refills   venlafaxine XR (EFFEXOR-XR) 37.5 MG 24 hr capsule [Pharmacy Med Name: VENLAFAXINE HCL ER 37.5 MG CAP] 30 capsule 0    Sig: TAKE 1 CAPSULE BY MOUTH DAILY WITH BREAKFAST.     Psychiatry: Antidepressants - SNRI - desvenlafaxine & venlafaxine Failed - 12/19/2022  4:47 PM      Failed - Cr in normal range and within 360 days    Creatinine  Date Value Ref Range Status  03/27/2013 0.73 0.60 - 1.30 mg/dL Final   Creatinine, Ser  Date Value Ref Range Status  05/09/2019 0.49 0.44 - 1.00 mg/dL Final         Failed - Lipid Panel in normal range within the last 12 months    No results found for: "CHOL", "POCCHOL", "CHOLTOT" No results found for: "LDLCALC", "LDLC", "HIRISKLDL", "POCLDL", "LDLDIRECT", "REALLDLC", "TOTLDLC" No results found for: "HDL", "POCHDL" No results found for: "TRIG", "POCTRIG"       Passed - Completed PHQ-2 or PHQ-9 in the last 360 days      Passed - Last BP in normal range    BP Readings from Last 1 Encounters:  10/20/22 130/80         Passed - Valid encounter within last 6 months    Recent Outpatient Visits           2 months ago Generalized anxiety disorder with panic attacks   [facility] Smitty Cords, DO   3 months ago Generalized anxiety disorder with panic attacks   [facility] [place], Netta Neat, DO       Future Appointments             In 1 month Althea Charon, Netta Neat, DO [facility], [facility]

## 2022-12-24 ENCOUNTER — Encounter: Payer: Self-pay | Admitting: Family Medicine

## 2022-12-24 ENCOUNTER — Telehealth: Payer: 59 | Admitting: Physician Assistant

## 2022-12-24 DIAGNOSIS — N760 Acute vaginitis: Secondary | ICD-10-CM | POA: Diagnosis not present

## 2022-12-24 MED ORDER — FLUCONAZOLE 150 MG PO TABS: ORAL_TABLET | ORAL | 0 refills | Status: DC

## 2022-12-24 MED ORDER — CLINDAMYCIN HCL 300 MG PO CAPS: 300.0000 mg | ORAL_CAPSULE | Freq: Two times a day (BID) | ORAL | 0 refills | Status: DC

## 2022-12-24 NOTE — Patient Instructions (Signed)
[name], thank you for joining Gilberto Better, PA-C for today's virtual visit.  While this provider is not your primary care provider (PCP), if your PCP is located in our provider database this encounter information will be shared with them immediately following your visit.   A [facility] MyChart account gives you access to today's visit and all your visits, tests, and labs performed at [facility] " click here if you don't have a [facility] MyChart account or go to [email or web address]  Consent: (Patient) Diamond Sutton provided verbal consent for this virtual visit at the beginning of the encounter.  Current Medications:  Current Outpatient Medications:    clindamycin (CLEOCIN) 300 MG capsule, Take 1 capsule (300 mg total) by mouth 2 (two) times daily., Disp: 14 capsule, Rfl: 0   fluconazole (DIFLUCAN) 150 MG tablet, Take one tablet by mouth x 1 day. May repeat in 72 hours if symptoms don't improve., Disp: 2 tablet, Rfl: 0   clonazePAM (KLONOPIN) 0.5 MG tablet, Take 0.5-1 tablets (0.25-0.5 mg total) by mouth 2 (two) times daily as needed for anxiety., Disp: 30 tablet, Rfl: 2   fluticasone (FLONASE) 50 MCG/ACT nasal spray, Place 2 sprays into both nostrils daily., Disp: 16 g, Rfl: 6   loperamide (IMODIUM A-D) 2 MG tablet, Take 1 tablet (2 mg total) by mouth 4 (four) times daily as needed for diarrhea or loose stools., Disp: 30 tablet, Rfl: 0   ondansetron (ZOFRAN) 4 MG tablet, Take 1 tablet (4 mg total) by mouth every 8 (eight) hours as needed for nausea or vomiting., Disp: 20 tablet, Rfl: 0   venlafaxine XR (EFFEXOR-XR) 37.5 MG 24 hr capsule, TAKE 1 CAPSULE BY MOUTH DAILY WITH BREAKFAST., Disp: 30 capsule, Rfl: 2   Medications ordered in this encounter:  Meds ordered this encounter  Medications   clindamycin (CLEOCIN) 300 MG capsule    Sig: Take 1 capsule (300 mg total) by mouth 2 (two) times daily.    Dispense:  14 capsule    Refill:  0    Order Specific  Question:   Supervising Provider    Answer:   Merrilee Jansky [[id number]]   fluconazole (DIFLUCAN) 150 MG tablet    Sig: Take one tablet by mouth x 1 day. May repeat in 72 hours if symptoms don't improve.    Dispense:  2 tablet    Refill:  0    Order Specific Question:   Supervising Provider    Answer:   Merrilee Jansky [id number]     *If you need refills on other medications prior to your next appointment, please contact your pharmacy*  Follow-Up: Call back or seek an in-person evaluation if the symptoms worsen or if the condition fails to improve as anticipated.  [facility] [phone]  Other Instructions Remove triggers causing recurrent vaginal discomfort and discharge Take Fluconazole initially as prescribed, but if no improvement then start Clindamycin.    If you have been instructed to have an in-person evaluation today at a local Urgent Care facility, please use the link below. It will take you to a list of all of our available [facility], including address, phone number and hours of operation. Please do not delay care.  [facility]  If you or a family member do not have a primary care provider, use the link below to schedule a visit and establish care. When you choose a [facility] primary care physician or advanced practice provider,  you gain a long-term partner in health. Find a Primary Care Provider  Learn more about [facility] in-office and virtual care options: [facility] - Get Care Now
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
## 2023-01-06 ENCOUNTER — Ambulatory Visit
Admission: RE | Admit: 2023-01-06 | Discharge: 2023-01-06 | Disposition: A | Discharge status: Home / Self Care | Payer: 59 | Source: Ambulatory Visit | Attending: Emergency Medicine | Admitting: Emergency Medicine

## 2023-01-06 ENCOUNTER — Ambulatory Visit: Payer: 59

## 2023-01-06 VITALS — BP 122/80 | HR 83 | Temp 97.6°F | Resp 18

## 2023-01-06 DIAGNOSIS — Z8619 Personal history of other infectious and parasitic diseases: Secondary | ICD-10-CM | POA: Insufficient documentation

## 2023-01-06 DIAGNOSIS — Z8742 Personal history of other diseases of the female genital tract: Secondary | ICD-10-CM | POA: Insufficient documentation

## 2023-01-06 DIAGNOSIS — Z113 Encounter for screening for infections with a predominantly sexual mode of transmission: Secondary | ICD-10-CM | POA: Diagnosis not present

## 2023-01-06 NOTE — ED Triage Notes (Signed)
Patient to Urgent Care for STD check. Denies any exposure.  Denies any current symptoms.
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
## 2023-01-07 LAB — CERVICOVAGINAL ANCILLARY ONLY
Bacterial Vaginitis (gardnerella): NEGATIVE
Candida Glabrata: NEGATIVE
Candida Vaginitis: NEGATIVE
Chlamydia: NEGATIVE
Comment: NEGATIVE
Comment: NEGATIVE
Comment: NEGATIVE
Comment: NEGATIVE
Comment: NEGATIVE
Comment: NORMAL
Neisseria Gonorrhea: NEGATIVE
Trichomonas: NEGATIVE

## 2023-01-07 LAB — HIV ANTIBODY (ROUTINE TESTING W REFLEX): HIV Screen 4th Generation wRfx: NONREACTIVE

## 2023-01-07 LAB — RPR: RPR Ser Ql: NONREACTIVE

## 2023-01-08 ENCOUNTER — Ambulatory Visit: Payer: 59 | Admitting: Family Medicine

## 2023-01-11 ENCOUNTER — Other Ambulatory Visit: Payer: Self-pay

## 2023-01-11 ENCOUNTER — Encounter: Payer: Self-pay | Admitting: Family Medicine

## 2023-01-11 DIAGNOSIS — F333 Major depressive disorder, recurrent, severe with psychotic symptoms: Secondary | ICD-10-CM | POA: Diagnosis not present

## 2023-01-11 DIAGNOSIS — F410 Panic disorder [episodic paroxysmal anxiety] without agoraphobia: Secondary | ICD-10-CM | POA: Diagnosis not present

## 2023-01-11 DIAGNOSIS — F411 Generalized anxiety disorder: Secondary | ICD-10-CM | POA: Diagnosis not present

## 2023-01-22 ENCOUNTER — Encounter: Payer: Self-pay | Admitting: Family Medicine

## 2023-01-22 ENCOUNTER — Ambulatory Visit (INDEPENDENT_AMBULATORY_CARE_PROVIDER_SITE_OTHER): Payer: 59 | Admitting: Family Medicine

## 2023-01-22 VITALS — BP 150/90 | HR 93 | Ht 66.0 in | Wt 171.0 lb

## 2023-01-22 DIAGNOSIS — F5104 Psychophysiologic insomnia: Secondary | ICD-10-CM

## 2023-01-22 DIAGNOSIS — F411 Generalized anxiety disorder: Secondary | ICD-10-CM | POA: Diagnosis not present

## 2023-01-22 DIAGNOSIS — F331 Major depressive disorder, recurrent, moderate: Secondary | ICD-10-CM | POA: Diagnosis not present

## 2023-01-22 DIAGNOSIS — R030 Elevated blood-pressure reading, without diagnosis of hypertension: Secondary | ICD-10-CM

## 2023-01-22 DIAGNOSIS — F410 Panic disorder [episodic paroxysmal anxiety] without agoraphobia: Secondary | ICD-10-CM | POA: Diagnosis not present

## 2023-01-22 NOTE — Patient Instructions (Addendum)
 Thank you for coming to the office today.  Keep on current medications and follow with [facility] and eventually with therapist.   DUE for FASTING BLOOD WORK (no food or drink after midnight before the lab appointment, only water or coffee without cream/sugar on the morning of)  SCHEDULE Lab Only visit in the morning at the clinic for lab draw in 6 MONTHS   - Make sure Lab Only appointment is at about 1 week before your next appointment, so that results will be available  For Lab Results, once available within 2-3 days of blood draw, you can can log in to MyChart online to view your results and a brief explanation. Also, we can discuss results at next follow-up visit.   Please schedule a Follow-up Appointment to: Return in about 6 months (around 07/22/2023) for 6 month fasting lab > 1 week later Annual Physical.  If you have any other questions or concerns, please feel free to call the office or send a message through MyChart. You may also schedule an earlier appointment if necessary.  Additionally, you may be receiving a survey about your experience at our office within a few days to 1 week by e-mail or mail. We value your feedback.  Marsa Officer, DO [facility], [place]

## 2023-01-22 NOTE — Progress Notes (Signed)
 Subjective:    Patient ID: Diamond Sutton, female    DOB: September 25, 1985, 38 y.o.   MRN: [id number]  Diamond Sutton is a 38 y.o. female presenting on 01/22/2023 for Medical Management of Chronic Issues   HPI  Discussed the use of AI scribe software for clinical note transcription with the patient, who gave verbal consent to proceed.  History of Present Illness        Generalized Anxiety Disorder with Panic Attacks Major Depression recurrent, moderate active Insomnia  Now established [facility]. They are taking over medications Venlafaxine  75 + 150 = 225mg  daily Quetiapine 100mg  nightly Clonazepam  0.5mg  twice a day as needed dosage change med started 01/15/23 Waiting to see therapist, they are trying to schedule  Elevated BP without HYPERTENSION dx The patient also reports an increase in salt intake, which is unusual for her typical diet. This dietary change coincides with elevated blood pressure readings, which the patient has been monitoring at home. The patient attributes the high blood pressure to her current state of aggravation.     01/22/2023    4:01 PM 10/20/2022    3:57 PM 09/08/2022    2:50 PM  Depression screen PHQ 2/9  Decreased Interest 3 3 2   Down, Depressed, Hopeless 2 2 2   PHQ - 2 Score 5 5 4   Altered sleeping 3 3 3   Tired, decreased energy 1 3 2   Change in appetite 3 3 2   Feeling bad or failure about yourself  3 1 2   Trouble concentrating 3 2 1   Moving slowly or fidgety/restless 2 2 0  Suicidal thoughts 0 0 0  PHQ-9 Score 20 19 14   Difficult doing work/chores  Somewhat difficult Somewhat difficult       01/22/2023    4:01 PM 10/20/2022    3:58 PM 09/08/2022    2:51 PM 05/26/2022    3:21 PM  GAD 7 : Generalized Anxiety Score  Nervous, Anxious, on Edge 3 3 3 2   Control/stop worrying 3 3 3 2   Worry too much - different things 3 3 3 2   Trouble relaxing 2 2 3 1   Restless 1 1 3 2   Easily annoyed or irritable 3 3 1 2   Afraid - awful might  happen 3 3 3 1   Total GAD 7 Score 18 18 19 12   Anxiety Difficulty   Somewhat difficult Very difficult    Social History   Tobacco Use   Smoking status: Every Day    Current packs/day: 0.05    Average packs/day: 0.1 packs/day for 15.0 years (0.8 ttl pk-yrs)    Types: Cigarettes   Smokeless tobacco: Never  Vaping Use   Vaping status: Never Used  Substance Use Topics   Alcohol use: Yes    Alcohol/week: 6.0 standard drinks of alcohol    Types: 4 Shots of liquor, 2 Standard drinks or equivalent per week    Comment: ocassional   Drug use: Never    Review of Systems Per HPI unless specifically indicated above     Objective:    BP (!) 150/90 (BP Location: Left Arm, Cuff Size: Normal)   Pulse 93   Ht 5' 6 (1.676 m)   Wt 171 lb (77.6 kg)   LMP 12/26/2022   SpO2 98%   BMI 27.60 kg/m   Wt Readings from Last 3 Encounters:  01/22/23 171 lb (77.6 kg)  10/20/22 171 lb (77.6 kg)  09/08/22 169 lb (76.7 kg)    Physical Exam Vitals and  nursing note reviewed.  Constitutional:      General: She is not in acute distress.    Appearance: Normal appearance. She is well-developed. She is not diaphoretic.     Comments: Well-appearing, comfortable, cooperative  HENT:     Head: Normocephalic and atraumatic.  Eyes:     General:        Right eye: No discharge.        Left eye: No discharge.     Conjunctiva/sclera: Conjunctivae normal.  Cardiovascular:     Rate and Rhythm: Normal rate.  Pulmonary:     Effort: Pulmonary effort is normal.  Skin:    General: Skin is warm and dry.     Findings: No erythema or rash.  Neurological:     Mental Status: She is alert and oriented to person, place, and time.  Psychiatric:        Mood and Affect: Mood normal.        Behavior: Behavior normal.        Thought Content: Thought content normal.     Comments: Well groomed, good eye contact, normal speech and thoughts     Results for orders placed or performed during the hospital encounter of  01/06/23  RPR   Collection Time: 01/06/23  4:31 PM  Result Value Ref Range   RPR Ser Ql Non Reactive Non Reactive  HIV Antibody (routine testing w rflx)   Collection Time: 01/06/23  4:31 PM  Result Value Ref Range   HIV Screen 4th Generation wRfx Non Reactive Non Reactive  Cervicovaginal ancillary only   Collection Time: 01/06/23  4:31 PM  Result Value Ref Range   Neisseria Gonorrhea Negative    Chlamydia Negative    Trichomonas Negative    Bacterial Vaginitis (gardnerella) Negative    Candida Vaginitis Negative    Candida Glabrata Negative    Comment Normal Reference Range Candida Species - Negative    Comment Normal Reference Range Candida Galbrata - Negative    Comment Normal Reference Range Trichomonas - Negative    Comment Normal Reference Ranger Chlamydia - Negative    Comment      Normal Reference Range Neisseria Gonorrhea - Negative   Comment      Normal Reference Range Bacterial Vaginosis - Negative      Assessment & Plan:   Problem List Items Addressed This Visit     Elevated BP without diagnosis of hypertension   Generalized anxiety disorder with panic attacks - Primary   Major depressive disorder, recurrent, moderate (HCC)   Psychophysiologic insomnia      GAD with panic attacks Major Depression recurrent moderate insomnia Mental Health Management Established now with [facility] on med management. Pending therapist CBT Recently started on Venlafaxine  225mg  daily, Quetiapine 100mg  at bedtime, and Clonazepam  0.5mg  twice daily -Continue current medications as prescribed by psychiatrist. -Encouraged to follow up with [facility] for therapy. -Keep primary care provider updated on mental health status and medication changes.  Elevated BP without HTN Elevated blood pressure readings noted during today's visit, possibly related to current mental health status and recent dietary changes. -Monitor blood pressure at home. -Consider lifestyle  modifications, including reducing sodium intake. -Plan to reassess blood pressure at next visit in 6 months, or sooner if consistently elevated readings are noted at home.  General Health Maintenance / Followup Plans -Schedule annual physical and basic blood panel in 6 months. -If blood pressure remains consistently elevated, consider earlier follow-up.  No orders of the defined types were placed in this encounter.   No orders of the defined types were placed in this encounter.   Follow up plan: Return in about 6 months (around 07/22/2023) for 6 month fasting lab > 1 week later Annual Physical.  Future labs 07/26/23  Marsa Officer, DO [facility] 01/23/2023, 4:21 PM

## 2023-01-23 ENCOUNTER — Other Ambulatory Visit: Payer: Self-pay | Admitting: Family Medicine

## 2023-01-23 DIAGNOSIS — R7309 Other abnormal glucose: Secondary | ICD-10-CM

## 2023-01-23 DIAGNOSIS — Z13220 Encounter for screening for lipoid disorders: Secondary | ICD-10-CM

## 2023-01-23 DIAGNOSIS — F331 Major depressive disorder, recurrent, moderate: Secondary | ICD-10-CM

## 2023-01-23 DIAGNOSIS — Z0000 Encounter for general adult medical examination without abnormal findings: Secondary | ICD-10-CM

## 2023-01-23 DIAGNOSIS — F411 Generalized anxiety disorder: Secondary | ICD-10-CM

## 2023-01-23 DIAGNOSIS — R030 Elevated blood-pressure reading, without diagnosis of hypertension: Secondary | ICD-10-CM

## 2023-01-29 ENCOUNTER — Other Ambulatory Visit: Payer: Self-pay | Admitting: Physician Assistant

## 2023-01-29 ENCOUNTER — Encounter: Payer: Self-pay | Admitting: Family Medicine

## 2023-01-29 DIAGNOSIS — N760 Acute vaginitis: Secondary | ICD-10-CM

## 2023-01-29 MED ORDER — CLINDAMYCIN HCL 300 MG PO CAPS: 300.0000 mg | ORAL_CAPSULE | Freq: Two times a day (BID) | ORAL | 2 refills | Status: DC

## 2023-02-01 DIAGNOSIS — F411 Generalized anxiety disorder: Secondary | ICD-10-CM | POA: Diagnosis not present

## 2023-02-08 DIAGNOSIS — F411 Generalized anxiety disorder: Secondary | ICD-10-CM | POA: Diagnosis not present

## 2023-02-10 DIAGNOSIS — F411 Generalized anxiety disorder: Secondary | ICD-10-CM | POA: Diagnosis not present

## 2023-02-10 DIAGNOSIS — F410 Panic disorder [episodic paroxysmal anxiety] without agoraphobia: Secondary | ICD-10-CM | POA: Diagnosis not present

## 2023-02-10 DIAGNOSIS — F333 Major depressive disorder, recurrent, severe with psychotic symptoms: Secondary | ICD-10-CM | POA: Diagnosis not present

## 2023-02-11 ENCOUNTER — Other Ambulatory Visit: Payer: Self-pay

## 2023-04-27 ENCOUNTER — Telehealth: Payer: MEDICAID | Admitting: Physician Assistant

## 2023-04-27 ENCOUNTER — Telehealth: Payer: MEDICAID

## 2023-04-27 DIAGNOSIS — A084 Viral intestinal infection, unspecified: Secondary | ICD-10-CM | POA: Diagnosis not present

## 2023-04-27 MED ORDER — ONDANSETRON 4 MG PO TBDP
4.0000 mg | ORAL_TABLET | Freq: Three times a day (TID) | ORAL | 0 refills | Status: DC | PRN
Start: 2023-04-27 — End: 2023-10-01

## 2023-04-27 NOTE — Progress Notes (Signed)
 E-Visit for Nausea and Vomiting   We are sorry that you are not feeling well. Here is how we plan to help!  Based on what you have shared with me it looks like you have a Virus that is irritating your GI tract.  Vomiting is the forceful emptying of a portion of the stomach's content through the mouth.  Although nausea and vomiting can make you feel miserable, it's important to remember that these are not diseases, but rather symptoms of an underlying illness.  When we treat short term symptoms, we always caution that any symptoms that persist should be fully evaluated in a medical office.  I have prescribed a medication that will help alleviate your symptoms and allow you to stay hydrated:  Zofran 4 mg 1 tablet every 8 hours as needed for nausea and vomiting  For your symptoms of diarrhea you may take Imodium 2 mg tablets that are over the counter at your local pharmacy. Take two tablet now and then one after each loose stool up to 6 a day.    HOME CARE: Drink clear liquids.  This is very important! Dehydration (the lack of fluid) can lead to a serious complication.  Start off with 1 tablespoon every 5 minutes for 8 hours. You may begin eating bland foods after 8 hours without vomiting.  Start with saltine crackers, white bread, rice, mashed potatoes, applesauce. After 48 hours on a bland diet, you may resume a normal diet. Try to go to sleep.  Sleep often empties the stomach and relieves the need to vomit.  GET HELP RIGHT AWAY IF:  Your symptoms do not improve or worsen within 2 days after treatment. You have a fever for over 3 days. You cannot keep down fluids after trying the medication.  MAKE SURE YOU:  Understand these instructions. Will watch your condition. Will get help right away if you are not doing well or get worse.    Thank you for choosing an e-visit.  Your e-visit answers were reviewed by a board certified advanced clinical practitioner to complete your personal care  plan. Depending upon the condition, your plan could have included both over the counter or prescription medications.  Please review your pharmacy choice. Make sure the pharmacy is open so you can pick up prescription now. If there is a problem, you may contact your provider through [employer] and have the prescription routed to another pharmacy.  Your safety is important to [place]. If you have drug allergies check your prescription carefully.   For the next 24 hours you can use MyChart to ask questions about today's visit, request a non-urgent call back, or ask for a work or school excuse. You will get an email in the next two days asking about your experience. I hope that your e-visit has been valuable and will speed your recovery.  I have spent 5 minutes in review of e-visit questionnaire, review and updating patient chart, medical decision making and response to patient.   [name], PA-C

## 2023-04-28 ENCOUNTER — Encounter: Payer: Self-pay | Admitting: Family Medicine

## 2023-04-28 ENCOUNTER — Telehealth: Payer: MEDICAID | Admitting: Family Medicine

## 2023-04-28 DIAGNOSIS — B9689 Other specified bacterial agents as the cause of diseases classified elsewhere: Secondary | ICD-10-CM

## 2023-04-28 MED ORDER — METRONIDAZOLE 500 MG PO TABS: 500.0000 mg | ORAL_TABLET | Freq: Two times a day (BID) | ORAL | 0 refills | Status: DC

## 2023-04-28 MED ORDER — CLINDAMYCIN HCL 300 MG PO CAPS: 300.0000 mg | ORAL_CAPSULE | Freq: Two times a day (BID) | ORAL | 0 refills | Status: DC

## 2023-04-28 NOTE — Addendum Note (Signed)
 Addended by: Waldon Merl on: 04/28/2023 10:47 AM   Modules accepted: Orders

## 2023-04-28 NOTE — Progress Notes (Signed)

## 2023-05-10 ENCOUNTER — Encounter: Payer: Self-pay | Admitting: Family Medicine

## 2023-05-10 DIAGNOSIS — F411 Generalized anxiety disorder: Secondary | ICD-10-CM

## 2023-05-10 DIAGNOSIS — F331 Major depressive disorder, recurrent, moderate: Secondary | ICD-10-CM

## 2023-05-11 NOTE — Addendum Note (Signed)
 Addended by: Raina Bunting on: 05/11/2023 01:15 PM   Modules accepted: Orders

## 2023-05-14 ENCOUNTER — Telehealth: Payer: MEDICAID | Admitting: Physician Assistant

## 2023-05-14 ENCOUNTER — Other Ambulatory Visit: Payer: Self-pay | Admitting: Family Medicine

## 2023-05-14 DIAGNOSIS — N760 Acute vaginitis: Secondary | ICD-10-CM

## 2023-05-14 DIAGNOSIS — B9689 Other specified bacterial agents as the cause of diseases classified elsewhere: Secondary | ICD-10-CM | POA: Diagnosis not present

## 2023-05-14 MED ORDER — CLINDAMYCIN HCL 300 MG PO CAPS: 300.0000 mg | ORAL_CAPSULE | Freq: Two times a day (BID) | ORAL | 0 refills | Status: AC

## 2023-05-14 MED ORDER — CLINDAMYCIN HCL 300 MG PO CAPS: 300.0000 mg | ORAL_CAPSULE | Freq: Two times a day (BID) | ORAL | 0 refills | Status: DC

## 2023-05-14 NOTE — Progress Notes (Signed)
 Pt will need to schedule another visit for concerns. DWB

## 2023-05-14 NOTE — Progress Notes (Signed)
 E-Visit for Vaginal Symptoms  We are sorry that you are not feeling well. Here is how we plan to help! Based on what you shared with me it looks like you: May have a vaginosis due to bacteria  Vaginosis is an inflammation of the vagina that can result in discharge, itching and pain. The cause is usually a change in the normal balance of vaginal bacteria or an infection. Vaginosis can also result from reduced estrogen levels after menopause.  The most common causes of vaginosis are:   Bacterial vaginosis which results from an overgrowth of one on several organisms that are normally present in your vagina.   Yeast infections which are caused by a naturally occurring fungus called candida.   Vaginal atrophy (atrophic vaginosis) which results from the thinning of the vagina from reduced estrogen levels after menopause.   Trichomoniasis which is caused by a parasite and is commonly transmitted by sexual intercourse.  Factors that increase your risk of developing vaginosis include: Medications, such as antibiotics and steroids Uncontrolled diabetes Use of hygiene products such as bubble bath, vaginal spray or vaginal deodorant Douching Wearing damp or tight-fitting clothing Using an intrauterine device (IUD) for birth control Hormonal changes, such as those associated with pregnancy, birth control pills or menopause Sexual activity Having a sexually transmitted infection  Your treatment plan is Clindamycin  300mg  twice a day for 7 days.  I have electronically sent this prescription into the pharmacy that you have chosen.  Be sure to take all of the medication as directed. Stop taking any medication if you develop a rash, tongue swelling or shortness of breath. Mothers who are breast feeding should consider pumping and discarding their breast milk while on these antibiotics. However, there is no consensus that infant exposure at these doses would be harmful.  Remember that medication creams  can weaken latex condoms. Aaron Aas   HOME CARE:  Good hygiene may prevent some types of vaginosis from recurring and may relieve some symptoms:  Avoid baths, hot tubs and whirlpool spas. Rinse soap from your outer genital area after a shower, and dry the area well to prevent irritation. Don't use scented or harsh soaps, such as those with deodorant or antibacterial action. Avoid irritants. These include scented tampons and pads. Wipe from front to back after using the toilet. Doing so avoids spreading fecal bacteria to your vagina.  Other things that may help prevent vaginosis include:  Don't douche. Your vagina doesn't require cleansing other than normal bathing. Repetitive douching disrupts the normal organisms that reside in the vagina and can actually increase your risk of vaginal infection. Douching won't clear up a vaginal infection. Use a latex condom. Both female and female latex condoms may help you avoid infections spread by sexual contact. Wear cotton underwear. Also wear pantyhose with a cotton crotch. If you feel comfortable without it, skip wearing underwear to bed. Yeast thrives in [employer] Your symptoms should improve in the next day or two.  GET HELP RIGHT AWAY IF:  You have pain in your lower abdomen ( pelvic area or over your ovaries) You develop nausea or vomiting You develop a fever Your discharge changes or worsens You have persistent pain with intercourse You develop shortness of breath, a rapid pulse, or you faint.  These symptoms could be signs of problems or infections that need to be evaluated by a medical provider now.  MAKE SURE YOU   Understand these instructions. Will watch your condition. Will get help right away if  you are not doing well or get worse.  Thank you for choosing an e-visit.  Your e-visit answers were reviewed by a board certified advanced clinical practitioner to complete your personal care plan. Depending upon the condition, your  plan could have included both over the counter or prescription medications.  Please review your pharmacy choice. Make sure the pharmacy is open so you can pick up prescription now. If there is a problem, you may contact your provider through [employer] and have the prescription routed to another pharmacy.  Your safety is important to us . If you have drug allergies check your prescription carefully.   For the next 24 hours you can use MyChart to ask questions about today's visit, request a non-urgent call back, or ask for a work or school excuse. You will get an email in the next two days asking about your experience. I hope that your e-visit has been valuable and will speed your recovery.   I have spent 5 minutes in review of e-visit questionnaire, review and updating patient chart, medical decision making and response to patient.   Angelia Kelp, PA-C

## 2023-05-17 ENCOUNTER — Ambulatory Visit (INDEPENDENT_AMBULATORY_CARE_PROVIDER_SITE_OTHER): Payer: MEDICAID | Admitting: Psychiatry

## 2023-05-17 ENCOUNTER — Ambulatory Visit
Admission: RE | Admit: 2023-05-17 | Discharge: 2023-05-17 | Disposition: A | Discharge status: Home / Self Care | Payer: MEDICAID | Source: Ambulatory Visit | Attending: Family Medicine | Admitting: Family Medicine

## 2023-05-17 VITALS — BP 153/98 | HR 100 | Temp 97.9°F | Resp 18

## 2023-05-17 DIAGNOSIS — F3181 Bipolar II disorder: Secondary | ICD-10-CM

## 2023-05-17 DIAGNOSIS — F99 Mental disorder, not otherwise specified: Secondary | ICD-10-CM

## 2023-05-17 DIAGNOSIS — R300 Dysuria: Secondary | ICD-10-CM | POA: Diagnosis not present

## 2023-05-17 DIAGNOSIS — F5105 Insomnia due to other mental disorder: Secondary | ICD-10-CM | POA: Diagnosis not present

## 2023-05-17 DIAGNOSIS — F3112 Bipolar disorder, current episode manic without psychotic features, moderate: Secondary | ICD-10-CM | POA: Diagnosis not present

## 2023-05-17 DIAGNOSIS — F410 Panic disorder [episodic paroxysmal anxiety] without agoraphobia: Secondary | ICD-10-CM | POA: Diagnosis not present

## 2023-05-17 DIAGNOSIS — Z113 Encounter for screening for infections with a predominantly sexual mode of transmission: Secondary | ICD-10-CM | POA: Insufficient documentation

## 2023-05-17 DIAGNOSIS — F411 Generalized anxiety disorder: Secondary | ICD-10-CM | POA: Diagnosis not present

## 2023-05-17 DIAGNOSIS — F514 Sleep terrors [night terrors]: Secondary | ICD-10-CM

## 2023-05-17 LAB — POCT URINALYSIS DIP (MANUAL ENTRY)
Bilirubin, UA: NEGATIVE
Glucose, UA: NEGATIVE mg/dL
Ketones, POC UA: NEGATIVE mg/dL
Leukocytes, UA: NEGATIVE
Nitrite, UA: NEGATIVE
Protein Ur, POC: 30 mg/dL — AB
Spec Grav, UA: 1.02 (ref 1.010–1.025)
Urobilinogen, UA: 0.2 U/dL
pH, UA: 6.5 (ref 5.0–8.0)

## 2023-05-17 LAB — POCT URINE PREGNANCY: Preg Test, Ur: NEGATIVE

## 2023-05-17 MED ORDER — HYDROXYZINE HCL 25 MG PO TABS: 25.0000 mg | ORAL_TABLET | Freq: Three times a day (TID) | ORAL | 0 refills | Status: DC | PRN

## 2023-05-17 MED ORDER — ARIPIPRAZOLE 5 MG PO TABS: 5.0000 mg | ORAL_TABLET | Freq: Every day | ORAL | 0 refills | Status: DC
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
## 2023-05-17 NOTE — ED Triage Notes (Signed)
 Patient presents to [facility] for lower abdominal pain x 1 week. Dysuria today. Requesting STD screening and blood work.
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
## 2023-05-17 NOTE — Discharge Instructions (Addendum)
 The clinic will contact you with results of the urine culture and STD testing done today if positive.  Lots of rest and fluids.  Follow-up with your gynecologist or PCP as needed.  Please go to the ER if you develop any worsening symptoms.  Hope you feel better soon!

## 2023-05-18 LAB — CERVICOVAGINAL ANCILLARY ONLY
Chlamydia: NEGATIVE
Comment: NEGATIVE
Comment: NEGATIVE
Comment: NORMAL
Neisseria Gonorrhea: NEGATIVE
Trichomonas: NEGATIVE

## 2023-05-18 LAB — URINE CULTURE: Culture: NO GROWTH

## 2023-05-18 LAB — HIV ANTIBODY (ROUTINE TESTING W REFLEX): HIV Screen 4th Generation wRfx: NONREACTIVE

## 2023-05-18 LAB — RPR: RPR Ser Ql: NONREACTIVE

## 2023-05-19 ENCOUNTER — Telehealth: Payer: Self-pay

## 2023-05-19 NOTE — Telephone Encounter (Signed)
 pt left a message that the abilify needed a prior auth

## 2023-05-19 NOTE — Telephone Encounter (Signed)
 went online to [email or web address] and submitted the prior auth . - pending

## 2023-05-19 NOTE — Telephone Encounter (Signed)
 called pharmacy to get insurance infomation   Id# [id number] [name] BIN# [id number] PCN# [id number]

## 2023-05-20 NOTE — Telephone Encounter (Signed)
 received fax that the abilify  was approved from 05-20-23 to 05-19-24

## 2023-05-20 NOTE — Telephone Encounter (Signed)
faxed and confirmed approval notice to the pharmacy

## 2023-06-01 ENCOUNTER — Telehealth: Payer: MEDICAID | Admitting: Psychiatry

## 2023-06-07 ENCOUNTER — Telehealth (INDEPENDENT_AMBULATORY_CARE_PROVIDER_SITE_OTHER): Payer: MEDICAID | Admitting: Psychiatry

## 2023-06-07 DIAGNOSIS — F99 Mental disorder, not otherwise specified: Secondary | ICD-10-CM

## 2023-06-07 DIAGNOSIS — F3112 Bipolar disorder, current episode manic without psychotic features, moderate: Secondary | ICD-10-CM

## 2023-06-07 DIAGNOSIS — F514 Sleep terrors [night terrors]: Secondary | ICD-10-CM

## 2023-06-07 DIAGNOSIS — F410 Panic disorder [episodic paroxysmal anxiety] without agoraphobia: Secondary | ICD-10-CM

## 2023-06-07 DIAGNOSIS — F5105 Insomnia due to other mental disorder: Secondary | ICD-10-CM

## 2023-06-07 DIAGNOSIS — F411 Generalized anxiety disorder: Secondary | ICD-10-CM

## 2023-06-07 NOTE — Progress Notes (Signed)
 No show

## 2023-06-08 ENCOUNTER — Telehealth (INDEPENDENT_AMBULATORY_CARE_PROVIDER_SITE_OTHER): Payer: MEDICAID | Admitting: Psychiatry

## 2023-06-08 DIAGNOSIS — F411 Generalized anxiety disorder: Secondary | ICD-10-CM

## 2023-06-08 DIAGNOSIS — F410 Panic disorder [episodic paroxysmal anxiety] without agoraphobia: Secondary | ICD-10-CM

## 2023-06-08 DIAGNOSIS — F3112 Bipolar disorder, current episode manic without psychotic features, moderate: Secondary | ICD-10-CM

## 2023-06-08 DIAGNOSIS — F514 Sleep terrors [night terrors]: Secondary | ICD-10-CM

## 2023-06-08 DIAGNOSIS — F99 Mental disorder, not otherwise specified: Secondary | ICD-10-CM

## 2023-06-08 DIAGNOSIS — F5105 Insomnia due to other mental disorder: Secondary | ICD-10-CM | POA: Diagnosis not present

## 2023-06-08 MED ORDER — ARIPIPRAZOLE 10 MG PO TABS: 10.0000 mg | ORAL_TABLET | Freq: Every day | ORAL | 0 refills | Status: DC

## 2023-06-08 MED ORDER — TRAZODONE HCL 50 MG PO TABS: 50.0000 mg | ORAL_TABLET | Freq: Every day | ORAL | 0 refills | Status: DC

## 2023-06-08 NOTE — Progress Notes (Signed)
 [facility] MD/PA/NP OP Progress Note  06/08/2023 3:01 PM Diamond Sutton  MRN:  [id number]  Chief Complaint: Routine Follow-up  Virtual Visit via Video Note  I connected with Diamond Sutton on 06/08/23 at  3:00 PM EDT by a video enabled telemedicine application and verified that I am speaking with the correct person using two identifiers.  Location: Patient: [address]  [place] [address]  Provider: [facility]   I discussed the limitations of evaluation and management by telemedicine and the availability of in person appointments. The patient expressed understanding and agreed to proceed.    I discussed the assessment and treatment plan with the patient. The patient was provided an opportunity to ask questions and all were answered. The patient agreed with the plan and demonstrated an understanding of the instructions.   The patient was advised to call back or seek an in-person evaluation if the symptoms worsen or if the condition fails to improve as anticipated.  I provided 20 minutes of non-face-to-face time during this encounter.   Arlana Labor, NP    HPI: 38 year old female presenting to [facility] for follow-up.  Patient reported by telehealth stating that she has not been sleeping for the last 5 days and is stating she needs help with sleep.  Patient reports that the mirtazapine and the Minipress she is taking is not helping and she would like to stop the medication.  Patient also reported that she is still experiencing hallucinations in which she tried to go out yesterday with her sister and felt and saw that the grass was breathing.  Patient states that she was not able to handle the hallucinations and asked for her sister to bring her back home.  Patient reports that she is trying to go to sleep at 10 but wakes up about an hour after, starts doing work around the house and will fall asleep around 4 AM and will wake back up at 6 AM.  Patient reporting that the sleep is a  major concern as well as the hallucinations.  Based on this assessment interview patient is recommended to continue taking Abilify  with an increase to 10 mg once daily.  Patient has stopped mirtazapine and Minipress.  Patient is also to start trazodone  50 mg once a day before bed.  Patient has been educated on sleep hygiene and also also been encouraged to get to bed at 1030 and tried to go to sleep despite feeling tired.  Patient has been instructed to take medication of trazodone  about an hour before her bedtime.  Patient has also been taking hydroxyzine  for anxiety management.  Patient will be assessed and followed to try to control hallucinations as well as to promote sleep.  Patient is in agreement with plan no other questions or concerns at this time and follow up in 1 week.  Patient denies SI, HI. Visit Diagnosis:    ICD-10-CM   1. Bipolar 1 disorder with moderate mania (HCC)  F31.12 ARIPiprazole  (ABILIFY ) 10 MG tablet    2. GAD (generalized anxiety disorder)  F41.1     3. Panic attacks  F41.0     4. Insomnia due to other mental disorder  F51.05 traZODone  (DESYREL ) 50 MG tablet   F99     5. Night terrors, adult  F51.4       Past Psychiatric History:  Previous Psych Hospitalizations: Denies   Outpatient treatment:  -Previously treated by Dr. Valaria Garland   Medications Current: - Duloxetine 60mg  once daily  -  Abilify  10mg  once daily.  -Trazadone 50mg  once daily   Medication Trials: - Seroquel - sedation and grogginess - Fluoxetine - poor response - Mirtazaipine, poor response - Minipress - poor response   Suicide & Violence: -Denies SI/HI, endorses auditory and visual hallucination with paranoia   Psychotherapy: - Requested therapist referral list.    Legal:  -  Denies  Past Medical History:  Past Medical History:  Diagnosis Date   Anemia    Bacterial vaginal infection    Depression 2001   Ectopic pregnancy    History of urinary tract infection    Hypertension     Ovarian cyst     Past Surgical History:  Procedure Laterality Date   DILATION AND CURETTAGE OF UTERUS     SAB   TUBAL LIGATION N/A 05/09/2019   Procedure: POST PARTUM TUBAL LIGATION;  Surgeon: Teresa Fender, MD;  Location: [facility] ORS;  Service: Gynecology;  Laterality: N/A;    Family Psychiatric History: Pt reports mother with possible bipolar, depression and anxiety.   Family History:  Family History  Problem Relation Age of Onset   Diabetes Mother    Hypertension Mother    Heart murmur Half-Brother    Breast cancer Neg Hx    Ovarian cancer Neg Hx    Colon cancer Neg Hx    Heart disease Neg Hx     Social History:  Social History   Socioeconomic History   Marital status: Single    Spouse name: Not on file   Number of children: 8   Years of education: Not on file   Highest education level: GED or equivalent  Occupational History   Not on file  Tobacco Use   Smoking status: Every Day    Current packs/day: 0.05    Average packs/day: 0.1 packs/day for 15.0 years (0.8 ttl pk-yrs)    Types: Cigarettes   Smokeless tobacco: Never  Vaping Use   Vaping status: Never Used  Substance and Sexual Activity   Alcohol use: Yes    Alcohol/week: 6.0 standard drinks of alcohol    Types: 4 Shots of liquor, 2 Standard drinks or equivalent per week    Comment: ocassional   Drug use: Never   Sexual activity: Yes    Partners: Male    Birth control/protection: Surgical  Other Topics Concern   Not on file  Social History Narrative   Not on file   Social Drivers of Health   Financial Resource Strain: Low Risk  (03/09/2023)   Received from [facility]   Overall Financial Resource Strain (CARDIA)    Difficulty of Paying Living Expenses: Not hard at all  Food Insecurity: No Food Insecurity (03/09/2023)   Received from [facility]   Hunger Vital Sign    Worried About Running Out of Food in the Last Year: Never true    Ran Out of Food in the Last  Year: Never true  Transportation Needs: No Transportation Needs (03/09/2023)   Received from [facility] - Transportation    In the past 12 months, has lack of transportation kept you from medical appointments or from getting medications?: No    Lack of Transportation (Non-Medical): No  Physical Activity: Unknown (10/20/2022)   Exercise Vital Sign    Days of Exercise per Week: 0 days    Minutes of Exercise per Session: Not on file  Stress: Stress Concern Present (10/20/2022)   [employer] of Occupational Health - Occupational  Stress Questionnaire    Feeling of Stress : Very much  Social Connections: Unknown (10/20/2022)   Social Connection and Isolation Panel [NHANES]    Frequency of Communication with Friends and Family: Once a week    Frequency of Social Gatherings with Friends and Family: Twice a week    Attends Religious Services: Never    Database administrator or Organizations: No    Attends Engineer, structural: Not on file    Marital Status: Patient declined    Allergies:  Allergies  Allergen Reactions   Penicillins Anaphylaxis    12/2014: received ancef  w/o complications  Has patient had a PCN reaction causing immediate rash, facial/tongue/throat swelling, SOB or lightheadedness with hypotension: Yes Has patient had a PCN reaction causing severe rash involving mucus membranes or skin necrosis: Yes Has patient had a PCN reaction that required hospitalization Yes Has patient had a PCN reaction occurring within the last 10 years: No If all of the above answers are "NO", then may proceed with Cephalosporin use.     Metabolic Disorder Labs: Lab Results  Component Value Date   HGBA1C 6.0 (H) 05/05/2019   No results found for: "PROLACTIN" No results found for: "CHOL", "TRIG", "HDL", "CHOLHDL", "VLDL", "LDLCALC" No results found for: "TSH"  Therapeutic Level Labs: No results found for: "LITHIUM" No results found for:  "VALPROATE" No results found for: "CBMZ"  Current Medications: Current Outpatient Medications  Medication Sig Dispense Refill   ARIPiprazole  (ABILIFY ) 5 MG tablet Take 1 tablet (5 mg total) by mouth daily. 30 tablet 0   clonazePAM  (KLONOPIN ) 1 MG tablet Take 1 mg by mouth 2 (two) times daily as needed.     DULoxetine (CYMBALTA) 30 MG capsule Take 30 mg by mouth daily.     fluticasone  (FLONASE ) 50 MCG/ACT nasal spray Place 2 sprays into both nostrils daily. 16 g 6   hydrOXYzine  (ATARAX ) 25 MG tablet Take 1 tablet (25 mg total) by mouth 3 (three) times daily as needed. 90 tablet 0   ibuprofen  (ADVIL ) 800 MG tablet Take 800 mg by mouth every 6 (six) hours as needed.     loperamide  (IMODIUM  A-D) 2 MG tablet Take 1 tablet (2 mg total) by mouth 4 (four) times daily as needed for diarrhea or loose stools. 30 tablet 0   ondansetron  (ZOFRAN -ODT) 4 MG disintegrating tablet Take 1-2 tablets (4-8 mg total) by mouth every 8 (eight) hours as needed. 20 tablet 0   No current facility-administered medications for this visit.     Musculoskeletal: Strength & Muscle Tone: within normal limits Gait & Station: normal Patient leans: N/A  Psychiatric Specialty Exam: Review of Systems  Constitutional: Negative.   HENT: Negative.    Eyes: Negative.   Respiratory: Negative.    Cardiovascular: Negative.   Gastrointestinal: Negative.   Endocrine: Negative.   Genitourinary: Negative.   Musculoskeletal: Negative.   Allergic/Immunologic: Negative.   Neurological: Negative.   Hematological: Negative.   Psychiatric/Behavioral:  Positive for decreased concentration, dysphoric mood and hallucinations. The patient is nervous/anxious.     Last menstrual period 04/28/2023.There is no height or weight on file to calculate BMI.  General Appearance: Fairly Groomed  Eye Contact:  Good  Speech:  Clear and Coherent  Volume:  Normal  Mood:  Anxious and Depressed  Affect:  Depressed  Thought Process:  Coherent   Orientation:  Full (Time, Place, and Person)  Thought Content: Hallucinations: Visual   Suicidal Thoughts:  No  Homicidal Thoughts:  No  Memory:  Immediate;   Good Recent;   Good Remote;   Good  Judgement:  Good  Insight:  Good  Psychomotor Activity:  Normal  Concentration:  Concentration: Good and Attention Span: Good  Recall:  Good  Fund of Knowledge: Good  Language: Good  Akathisia:  No  Handed:  Right  AIMS (if indicated):   Assets:  Desire for Improvement Financial Resources/Insurance Housing  ADL's:  Intact  Cognition: WNL  Sleep:  Good   Screenings: GAD-7    Flowsheet Row Office Visit from 01/22/2023 in [facility] Office Visit from 10/20/2022 in [facility] Office Visit from 09/08/2022 in [facility] Video Visit from 05/26/2022 in [facility]  Total GAD-7 Score 18 18 19 12       PHQ2-9    Flowsheet Row Office Visit from 01/22/2023 in [facility] Office Visit from 10/20/2022 in [facility] Office Visit from 09/08/2022 in [facility] Office Visit from 07/18/2019 in [facility] from 03/29/2019 in [facility] Total Score 5 5 4  0 0  PHQ-9 Total Score 20 19 14  0 --      Flowsheet Row UC from 05/17/2023 in [facility]  UC from 01/06/2023 in [facility]  ED from 02/27/2022 in [facility] Emergency Department at [facility]  C-SSRS RISK CATEGORY No Risk No Risk No Risk        Assessment and Plan:  Assessment - Diagnosis: Bipolar 1 disorder with moderate mania (HCC) [F31.12]  2. GAD (generalized anxiety disorder) [F41.1]  3. Panic attacks [F41.0]  4. Insomnia due to other mental disorder [F51.05, F99]  5. Night terrors, adult [F51.4]   Differential -Schizophrenia - Progress: Patient reports that no improvement in sleep and she has not been sleeping for the last 5 days.  Patient reports she still experiencing hallucinations throughout the day and is intensifying due to not getting sleep. - Risk Factors: Worsening symptoms  Plan - Medications:  Continue taking duloxetine 60 mg once a day, prescribed by the previous provider Stop taking mirtazapine  Stop taking Minipress  Start taking Abilify  10 mg once a day due to, bipolar symptoms with psychosis, she has been educated on monitoring for movements and symptoms of tardive dyskinesia and has been notified to contact the clinic should she experience symptoms of stop medication. Start taking hydroxyzine  25 mg 3 times a day as needed for anxiety, for panic attacks and anxiety.  Patient has been educated on sedation education and instructed not to take medication with vehicles. - Psychotherapy: The patient will be referred to psychotherapy, patient referral list will be sent to the patient. - Education: Patient was educated on panic disorder as well as anxiety, with education at the clinic does not provide benzodiazepines in which she was in agreement.  Patient also was educated on medication purpose, side effects and adverse reactions.   - Follow-Up: Patient will follow up in 2 weeks - Referrals: Patient was provided referral list for therapy - Safety Planning: The patient has been educated, if they should have suicidal thoughts with or without a plan to call 911, or go to the closest emergency department.  Pt verbalized understanding.  Pt denies firearms within the home.  Pt also agrees to call the clinic should they  have worsening symptoms before the next appointment.      Patient/Guardian was advised Release of Information must be obtained prior to any record release in order to collaborate their care with an outside provider. Patient/Guardian was advised if they have not already  done so to contact the registration department to sign all necessary forms in order for us  to release information regarding their care.   Consent: Patient/Guardian gives verbal consent for treatment and assignment of benefits for services provided during this visit. Patient/Guardian expressed understanding and agreed to proceed.    Arlana Labor, NP 06/08/2023, 3:01 PM

## 2023-06-16 ENCOUNTER — Telehealth: Payer: MEDICAID | Admitting: Psychiatry

## 2023-06-21 ENCOUNTER — Telehealth (INDEPENDENT_AMBULATORY_CARE_PROVIDER_SITE_OTHER): Payer: MEDICAID | Admitting: Psychiatry

## 2023-06-21 DIAGNOSIS — F411 Generalized anxiety disorder: Secondary | ICD-10-CM

## 2023-06-21 DIAGNOSIS — F3112 Bipolar disorder, current episode manic without psychotic features, moderate: Secondary | ICD-10-CM

## 2023-06-21 DIAGNOSIS — F5105 Insomnia due to other mental disorder: Secondary | ICD-10-CM

## 2023-06-21 DIAGNOSIS — F514 Sleep terrors [night terrors]: Secondary | ICD-10-CM

## 2023-06-21 DIAGNOSIS — F410 Panic disorder [episodic paroxysmal anxiety] without agoraphobia: Secondary | ICD-10-CM

## 2023-06-21 MED ORDER — OLANZAPINE 7.5 MG PO TABS: 7.5000 mg | ORAL_TABLET | Freq: Every day | ORAL | 0 refills | Status: DC

## 2023-06-21 NOTE — Progress Notes (Signed)
 BH MD/PA/NP OP Progress Note  06/21/2023 2:36 PM GEM CONKLE  MRN:  [id number]  Chief Complaint: Routine Follow-up  Virtual Visit via Video Note  I connected with Diamond Sutton on 06/21/23 at  2:30 PM EDT by a video enabled telemedicine application and verified that I am speaking with the correct person using two identifiers.  Location: Patient: [address]  [place] [address]  Provider: [facility] Office Provider   I discussed the limitations of evaluation and management by telemedicine and the availability of in person appointments. The patient expressed understanding and agreed to proceed.    I discussed the assessment and treatment plan with the patient. The patient was provided an opportunity to ask questions and all were answered. The patient agreed with the plan and demonstrated an understanding of the instructions.   The patient was advised to call back or seek an in-person evaluation if the symptoms worsen or if the condition fails to improve as anticipated.  I provided 30 minutes of non-face-to-face time during this encounter.   Arlana Labor, NP    HPI: [phone] female presenting to [facility] for medication management and follow-up.  Patient reports that she continues to have difficulty sleeping stating that the bags under her eyes are present because she is only sleeping 1.5 hours a night.  Patient endorses continued to have auditory and visual hallucinations that is preventing her from getting good sleep.  Patient also endorses that she has continued to have irritability and agitation with her family as she is "blowing up" and putting distressed on relationships.  Patient does state that she did establish a therapist to work on her irritability and agitation with family members while on medication management.  Patient reports that she has been continuing to take care of her family and with no significant changes on symptoms.  Based on this assessment and interview it  is recommended for the patient to be changed from Abilify  and trazodone  the to Zyprexa 10 mg once daily.  Patient will stop using Abilify  and trazodone .  Patient will also continue to use hydroxyzine  25 mg 3 times a day as needed for anxiety.  Patient reports that she was given clonazepam  from another provider in which she was recommended to not take the medication and to utilize hydroxyzine  as needed for medication management.  Patient will also continue taking duloxetine until auditory and visual hallucinations are under control.  Patient will follow up in 2 weeks.  Patient with no other questions or concerns patient is in agreement with treatment plan.  At this time patient denies SI, HI.   Visit Diagnosis:    ICD-10-CM   1. Bipolar 1 disorder with moderate mania (HCC)  F31.12     2. GAD (generalized anxiety disorder)  F41.1     3. Panic attacks  F41.0     4. Insomnia due to other mental disorder  F51.05    F99     5. Night terrors, adult  F51.4       Past Psychiatric History:  Previous Psych Hospitalizations: Denies   Outpatient treatment:  -Previously treated by Dr. Valaria Garland   Medications Current: - Duloxetine 60mg  once daily  - Zyprexa 10mg  once daily   Medication Trials: - Seroquel - sedation and grogginess - Fluoxetine - poor response - Mirtazaipine, poor response - Minipress - poor response -Abilify  06/21/23, poor response -Trazadone 06/21/23, poor repsonse   Suicide & Violence: -Denies SI/HI, endorses auditory and visual hallucination with paranoia  Psychotherapy: - Requested therapist referral list.    Legal:  -  Denies  Past Medical History:  Past Medical History:  Diagnosis Date   Anemia    Bacterial vaginal infection    Depression 2001   Ectopic pregnancy    History of urinary tract infection    Hypertension    Ovarian cyst     Past Surgical History:  Procedure Laterality Date   DILATION AND CURETTAGE OF UTERUS     SAB   TUBAL LIGATION N/A  05/09/2019   Procedure: POST PARTUM TUBAL LIGATION;  Surgeon: Teresa Fender, MD;  Location: [facility] ORS;  Service: Gynecology;  Laterality: N/A;    Family Psychiatric History: Pt reports mother with possible bipolar, depression and anxiety.   Family History:  Family History  Problem Relation Age of Onset   Diabetes Mother    Hypertension Mother    Heart murmur Half-Brother    Breast cancer Neg Hx    Ovarian cancer Neg Hx    Colon cancer Neg Hx    Heart disease Neg Hx     Social History:  Social History   Socioeconomic History   Marital status: Single    Spouse name: Not on file   Number of children: 8   Years of education: Not on file   Highest education level: GED or equivalent  Occupational History   Not on file  Tobacco Use   Smoking status: Every Day    Current packs/day: 0.05    Average packs/day: 0.1 packs/day for 15.0 years (0.8 ttl pk-yrs)    Types: Cigarettes   Smokeless tobacco: Never  Vaping Use   Vaping status: Never Used  Substance and Sexual Activity   Alcohol use: Yes    Alcohol/week: 6.0 standard drinks of alcohol    Types: 4 Shots of liquor, 2 Standard drinks or equivalent per week    Comment: ocassional   Drug use: Never   Sexual activity: Yes    Partners: Male    Birth control/protection: Surgical  Other Topics Concern   Not on file  Social History Narrative   Not on file   Social Drivers of Health   Financial Resource Strain: Low Risk  (03/09/2023)   Received from [facility]   Overall Financial Resource Strain (CARDIA)    Difficulty of Paying Living Expenses: Not hard at all  Food Insecurity: No Food Insecurity (03/09/2023)   Received from [facility]   Hunger Vital Sign    Worried About Running Out of Food in the Last Year: Never true    Ran Out of Food in the Last Year: Never true  Transportation Needs: No Transportation Needs (03/09/2023)   Received from [facility] -  Transportation    In the past 12 months, has lack of transportation kept you from medical appointments or from getting medications?: No    Lack of Transportation (Non-Medical): No  Physical Activity: Unknown (10/20/2022)   Exercise Vital Sign    Days of Exercise per Week: 0 days    Minutes of Exercise per Session: Not on file  Stress: Stress Concern Present (10/20/2022)   [employer] of Occupational Health - Occupational Stress Questionnaire    Feeling of Stress : Very much  Social Connections: Unknown (10/20/2022)   Social Connection and Isolation Panel [NHANES]    Frequency of Communication with Friends and Family: Once a week    Frequency of Social Gatherings with Friends and Family: Twice  a week    Attends Religious Services: Never    Active Member of Clubs or Organizations: No    Attends Banker Meetings: Not on file    Marital Status: Patient declined    Allergies:  Allergies  Allergen Reactions   Penicillins Anaphylaxis    12/2014: received ancef  w/o complications  Has patient had a PCN reaction causing immediate rash, facial/tongue/throat swelling, SOB or lightheadedness with hypotension: Yes Has patient had a PCN reaction causing severe rash involving mucus membranes or skin necrosis: Yes Has patient had a PCN reaction that required hospitalization Yes Has patient had a PCN reaction occurring within the last 10 years: No If all of the above answers are "NO", then may proceed with Cephalosporin use.     Metabolic Disorder Labs: Lab Results  Component Value Date   HGBA1C 6.0 (H) 05/05/2019   No results found for: "PROLACTIN" No results found for: "CHOL", "TRIG", "HDL", "CHOLHDL", "VLDL", "LDLCALC" No results found for: "TSH"  Therapeutic Level Labs: No results found for: "LITHIUM" No results found for: "VALPROATE" No results found for: "CBMZ"  Current Medications: Current Outpatient Medications  Medication Sig Dispense Refill   ARIPiprazole   (ABILIFY ) 10 MG tablet Take 1 tablet (10 mg total) by mouth daily. 30 tablet 0   clonazePAM  (KLONOPIN ) 1 MG tablet Take 1 mg by mouth 2 (two) times daily as needed.     DULoxetine (CYMBALTA) 30 MG capsule Take 30 mg by mouth daily.     fluticasone  (FLONASE ) 50 MCG/ACT nasal spray Place 2 sprays into both nostrils daily. 16 g 6   hydrOXYzine  (ATARAX ) 25 MG tablet Take 1 tablet (25 mg total) by mouth 3 (three) times daily as needed. 90 tablet 0   ibuprofen  (ADVIL ) 800 MG tablet Take 800 mg by mouth every 6 (six) hours as needed.     loperamide  (IMODIUM  A-D) 2 MG tablet Take 1 tablet (2 mg total) by mouth 4 (four) times daily as needed for diarrhea or loose stools. 30 tablet 0   ondansetron  (ZOFRAN -ODT) 4 MG disintegrating tablet Take 1-2 tablets (4-8 mg total) by mouth every 8 (eight) hours as needed. 20 tablet 0   traZODone  (DESYREL ) 50 MG tablet Take 1 tablet (50 mg total) by mouth at bedtime. 30 tablet 0   No current facility-administered medications for this visit.     Musculoskeletal: Strength & Muscle Tone: within normal limits Gait & Station: normal Patient leans: N/A  Psychiatric Specialty Exam: Review of Systems  Constitutional: Negative.   HENT: Negative.    Eyes: Negative.   Respiratory: Negative.    Cardiovascular: Negative.   Gastrointestinal: Negative.   Endocrine: Negative.   Genitourinary: Negative.   Musculoskeletal: Negative.   Skin: Negative.   Allergic/Immunologic: Negative.   Neurological: Negative.   Hematological: Negative.     There were no vitals taken for this visit.There is no height or weight on file to calculate BMI.  General Appearance: Well Groomed  Eye Contact:  Good  Speech:  Clear and Coherent  Volume:  Normal  Mood:  Depressed  Affect:  Congruent  Thought Process:  Coherent  Orientation:  Full (Time, Place, and Person)  Thought Content: Logical and Hallucinations: Auditory Visual   Suicidal Thoughts:  No  Homicidal Thoughts:  No   Memory:  Immediate;   Good Recent;   Good Remote;   Good  Judgement:  Good  Insight:  Good  Psychomotor Activity:  Normal  Concentration:  Concentration: Good and Attention Span: Good  Recall:  Good  Fund of Knowledge: Good  Language: Good  Akathisia:  No  Handed:  Right  AIMS (if indicated):   Assets:  Financial Resources/Insurance Housing  ADL's:  Intact  Cognition: WNL  Sleep:  Poor   Screenings: GAD-7    Flowsheet Row Video Visit from 06/08/2023 in [facility] Office Visit from 01/22/2023 in [facility] Office Visit from 10/20/2022 in [facility] Office Visit from 09/08/2022 in [facility] Video Visit from 05/26/2022 in [facility]  Total GAD-7 Score 21 18 18 19 12       PHQ2-9    Flowsheet Row Video Visit from 06/08/2023 in [facility] Office Visit from 01/22/2023 in [facility] Office Visit from 10/20/2022 in [facility] Office Visit from 09/08/2022 in [facility] Office Visit from 07/18/2019 in [facility]  PHQ-2 Total Score 6 5 5 4  0  PHQ-9 Total Score 24 20 19 14  0      Flowsheet Row Video Visit from 06/08/2023 in [facility] UC from 05/17/2023 in [facility]  UC from 01/06/2023 in [facility] at [facility]   C-SSRS RISK CATEGORY No Risk No Risk No Risk        Assessment and Plan:   Assessment - Diagnosis: Bipolar 1 disorder with moderate mania (HCC) [F31.12]  2. GAD (generalized anxiety disorder) [F41.1]  3. Panic attacks [F41.0]  4. Insomnia due to other mental disorder [F51.05, F99]  5. Night terrors, adult [F51.4]  Differential -Schizophrenia - Progress: Patient reports that no improvement in sleep and she has not been  sleeping for the last 5 days.  Patient reports she still experiencing hallucinations throughout the day and is intensifying due to not getting sleep. - Risk Factors: Worsening symptoms  Plan - Medications:  Continue taking duloxetine 60 mg once a day, prescribed by the previous provider Stop taking Abilify  Stop taking Trazadone Start Zyprexa 10mg  once daily.  Continue Hydroxyzine  25mg  TID as need for anxiety. - Psychotherapy: Currently seeing a therapist, with goals of irritability and anger with relationships.  - Education: Patient was educated on panic disorder as well as anxiety, with education at the clinic does not provide benzodiazepines in which she was in agreement.  Patient also was educated on medication purpose, side effects and adverse reactions.   - Follow-Up: Patient will follow up in 2 weeks - Referrals: Patient was provided referral list for therapy - Safety Planning: The patient has been educated, if they should have suicidal thoughts with or without a plan to call 911, or go to the closest emergency department.  Pt verbalized understanding.  Pt denies firearms within the home.  Pt also agrees to call the clinic should they have worsening symptoms before the next appointment.   Patient/Guardian was advised Release of Information must be obtained prior to any record release in order to collaborate their care with an outside provider. Patient/Guardian was advised if they have not already done so to contact the registration department to sign all necessary forms in order for us  to release information regarding their care.   Consent: Patient/Guardian gives verbal consent for treatment and assignment of benefits for services provided during this visit. Patient/Guardian expressed understanding and agreed to proceed.   This office note has been dictated.  This dictation was prepared using Air traffic controller. As a result, errors may occur. When identified, these  errors have been corrected. While every attempt is made to correct errors during dictation, errors may still exist.    Arlana Labor, NP 06/21/2023, 2:36 PM

## 2023-07-04 ENCOUNTER — Ambulatory Visit
Admission: RE | Admit: 2023-07-04 | Discharge: 2023-07-04 | Disposition: A | Discharge status: Home / Self Care | Payer: MEDICAID | Source: Ambulatory Visit | Attending: Emergency Medicine | Admitting: Emergency Medicine

## 2023-07-04 VITALS — BP 136/71 | HR 58 | Temp 97.7°F | Resp 18

## 2023-07-04 DIAGNOSIS — R1030 Lower abdominal pain, unspecified: Secondary | ICD-10-CM | POA: Diagnosis present

## 2023-07-04 DIAGNOSIS — N898 Other specified noninflammatory disorders of vagina: Secondary | ICD-10-CM | POA: Diagnosis present

## 2023-07-04 DIAGNOSIS — Z113 Encounter for screening for infections with a predominantly sexual mode of transmission: Secondary | ICD-10-CM | POA: Insufficient documentation

## 2023-07-04 LAB — POCT URINALYSIS DIP (MANUAL ENTRY)
Bilirubin, UA: NEGATIVE
Glucose, UA: NEGATIVE mg/dL
Ketones, POC UA: NEGATIVE mg/dL
Leukocytes, UA: NEGATIVE
Nitrite, UA: NEGATIVE
Protein Ur, POC: NEGATIVE mg/dL
Spec Grav, UA: >=1.03 — AB (ref 1.010–1.025)
Urobilinogen, UA: 0.2 U/dL
pH, UA: 5.5 (ref 5.0–8.0)

## 2023-07-04 LAB — POCT URINE PREGNANCY: Preg Test, Ur: NEGATIVE

## 2023-07-04 MED ORDER — CLINDAMYCIN HCL 300 MG PO CAPS: 300.0000 mg | ORAL_CAPSULE | Freq: Two times a day (BID) | ORAL | 0 refills | Status: AC

## 2023-07-04 NOTE — Discharge Instructions (Addendum)
 Take the clindamycin  as directed.    Your tests are pending.  If your test results are positive, we will call you.  You and your sexual partner(s) may require treatment at that time.  Do not have sexual activity for at least 7 days.    Follow-up with your primary care provider if your symptoms are not improving.
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
## 2023-07-04 NOTE — ED Triage Notes (Signed)
 Patient to Urgent Care for STD testing. Reports some intermittent abdominal cramping and feeling different down there. Some vaginal discharge.   Symptoms started 4 days ago.

## 2023-07-05 ENCOUNTER — Telehealth: Payer: MEDICAID | Admitting: Psychiatry

## 2023-07-05 ENCOUNTER — Ambulatory Visit (HOSPITAL_COMMUNITY): Payer: Self-pay

## 2023-07-05 LAB — CERVICOVAGINAL ANCILLARY ONLY
Bacterial Vaginitis (gardnerella): NEGATIVE
Candida Glabrata: NEGATIVE
Candida Vaginitis: POSITIVE — AB
Chlamydia: NEGATIVE
Comment: NEGATIVE
Comment: NEGATIVE
Comment: NEGATIVE
Comment: NEGATIVE
Comment: NEGATIVE
Comment: NORMAL
Neisseria Gonorrhea: NEGATIVE
Trichomonas: NEGATIVE

## 2023-07-05 MED ORDER — FLUCONAZOLE 150 MG PO TABS: 150.0000 mg | ORAL_TABLET | Freq: Once | ORAL | 0 refills | Status: AC

## 2023-07-06 LAB — HIV ANTIBODY (ROUTINE TESTING W REFLEX): HIV Screen 4th Generation wRfx: NONREACTIVE

## 2023-07-06 LAB — RPR: RPR Ser Ql: NONREACTIVE

## 2023-07-13 ENCOUNTER — Ambulatory Visit: Payer: MEDICAID | Admitting: Psychiatry

## 2023-07-14 ENCOUNTER — Encounter: Payer: Self-pay | Admitting: Psychiatry

## 2023-07-14 ENCOUNTER — Ambulatory Visit (INDEPENDENT_AMBULATORY_CARE_PROVIDER_SITE_OTHER): Payer: MEDICAID | Admitting: Psychiatry

## 2023-07-14 ENCOUNTER — Other Ambulatory Visit: Payer: Self-pay

## 2023-07-14 VITALS — BP 139/93 | HR 94 | Temp 98.2°F | Ht 66.0 in | Wt 175.4 lb

## 2023-07-14 DIAGNOSIS — F410 Panic disorder [episodic paroxysmal anxiety] without agoraphobia: Secondary | ICD-10-CM | POA: Diagnosis not present

## 2023-07-14 DIAGNOSIS — F514 Sleep terrors [night terrors]: Secondary | ICD-10-CM

## 2023-07-14 DIAGNOSIS — F411 Generalized anxiety disorder: Secondary | ICD-10-CM

## 2023-07-14 DIAGNOSIS — F5105 Insomnia due to other mental disorder: Secondary | ICD-10-CM

## 2023-07-14 DIAGNOSIS — F3112 Bipolar disorder, current episode manic without psychotic features, moderate: Secondary | ICD-10-CM

## 2023-07-14 DIAGNOSIS — F99 Mental disorder, not otherwise specified: Secondary | ICD-10-CM

## 2023-07-14 MED ORDER — ARIPIPRAZOLE 5 MG PO TABS: 5.0000 mg | ORAL_TABLET | Freq: Every day | ORAL | 0 refills | Status: DC

## 2023-07-14 NOTE — Progress Notes (Signed)
 BH MD/PA/NP OP Progress Note  07/14/2023 4:08 PM Diamond Sutton  MRN:  [id number]  Chief Complaint:  Chief Complaint  Patient presents with   Follow-up   HPI: 38 year old female presenting [facility] for follow-up.  Patient has missed multiple appointments and patient has been reminded of the no-show policy here at the facility and patient reports that she is just missing the appointments because she forgets.  Patient reports that she currently has symptoms of not being able to sleep as well as irritability and anger and auditory and visual hallucinations.  When asked about medication she is taking patient states she stopped taking Zyprexa  due to the fact she was getting side effects and stated that she did not feel the need to let the provider know.  Patient reports that she did not feel good on the medication and also states that she feels better if she was taking Abilify  but states that has not resolved her sleeping issues as well as her anxiety panic with panic attacks and states that she has been taking clonazepam  in which the clonazepam  which is not prescribed by this provider.  Patient currently states that she is not being addressed with the current management and based on the patient's symptoms as well as previous appointments patient is not consistent with appointments, patient symptoms are left underdressed and is recommended for the patient be overseen by higher level of care.  This case will be shared with Dr. Arfeen.  Patient has been requested to wait for recommendations for higher level of care.  Patient has been prescribed Abilify  5 mg once daily for 1 month until she is able to see another provider.  Patient is in agreement with this treatment plan.  Patient with no other questions or concerns at this time.  Patient denies SI, HI.  Patient does endorse audio and visual hallucinations stating that shadowy figures as well as noises that she cannot make out there and unintelligible.  Patient  has been requested to wait for called for her paperwork to be reviewed by her psychiatrist and to wait for follow-up call back from the front desk. Visit Diagnosis:    ICD-10-CM   1. Bipolar 1 disorder with moderate mania (HCC)  F31.12     2. GAD (generalized anxiety disorder)  F41.1     3. Panic attacks  F41.0     4. Insomnia due to other mental disorder  F51.05    F99     5. Night terrors, adult  F51.4       Past Psychiatric History:  Previous Psych Hospitalizations: Denies   Outpatient treatment:  -Previously treated by Dr. Karlene Polo   Medications Current: - Duloxetine 60mg  once daily  - Abilify  5 mg once daily   Medication Trials: - Seroquel - sedation and grogginess - Fluoxetine - poor response - Mirtazaipine, poor response - Minipress - poor response -Trazadone 06/21/23, poor repsonse -Zyprexa  07/14/2023, side effects   Suicide & Violence: -Denies SI/HI, endorses auditory and visual hallucination with paranoia   Psychotherapy: - Requested therapist referral list.    Legal:  -  Denies  Past Medical History:  Past Medical History:  Diagnosis Date   Anemia    Bacterial vaginal infection    Depression 2001   Ectopic pregnancy    History of urinary tract infection    Hypertension    Ovarian cyst     Past Surgical History:  Procedure Laterality Date   DILATION AND CURETTAGE OF UTERUS  SAB   TUBAL LIGATION N/A 05/09/2019   Procedure: POST PARTUM TUBAL LIGATION;  Surgeon: Connell Davies, MD;  Location: [facility] ORS;  Service: Gynecology;  Laterality: N/A;    Family Psychiatric History: No additional  Family History:  Family History  Problem Relation Age of Onset   Diabetes Mother    Hypertension Mother    Heart murmur Half-Brother    Breast cancer Neg Hx    Ovarian cancer Neg Hx    Colon cancer Neg Hx    Heart disease Neg Hx     Social History:  Social History   Socioeconomic History   Marital status: Single    Spouse name: Not on file    Number of children: 8   Years of education: Not on file   Highest education level: GED or equivalent  Occupational History   Not on file  Tobacco Use   Smoking status: Every Day    Current packs/day: 0.05    Average packs/day: 0.1 packs/day for 15.0 years (0.8 ttl pk-yrs)    Types: Cigarettes   Smokeless tobacco: Never  Vaping Use   Vaping status: Never Used  Substance and Sexual Activity   Alcohol use: Yes    Alcohol/week: 6.0 standard drinks of alcohol    Types: 4 Shots of liquor, 2 Standard drinks or equivalent per week    Comment: ocassional   Drug use: Never   Sexual activity: Yes    Partners: Male    Birth control/protection: Surgical  Other Topics Concern   Not on file  Social History Narrative   Not on file   Social Drivers of Health   Financial Resource Strain: Low Risk  (03/09/2023)   Received from [facility]   Overall Financial Resource Strain (CARDIA)    Difficulty of Paying Living Expenses: Not hard at all  Food Insecurity: No Food Insecurity (03/09/2023)   Received from [facility]   Hunger Vital Sign    Within the past 12 months, you worried that your food would run out before you got the money to buy more.: Never true    Within the past 12 months, the food you bought just didn't last and you didn't have money to get more.: Never true  Transportation Needs: No Transportation Needs (03/09/2023)   Received from [facility] - Transportation    In the past 12 months, has lack of transportation kept you from medical appointments or from getting medications?: No    Lack of Transportation (Non-Medical): No  Physical Activity: Unknown (10/20/2022)   Exercise Vital Sign    Days of Exercise per Week: 0 days    Minutes of Exercise per Session: Not on file  Stress: Stress Concern Present (10/20/2022)   [employer] of Occupational Health - Occupational Stress Questionnaire    Feeling of Stress : Very  much  Social Connections: Unknown (10/20/2022)   Social Connection and Isolation Panel    Frequency of Communication with Friends and Family: Once a week    Frequency of Social Gatherings with Friends and Family: Twice a week    Attends Religious Services: Never    Database administrator or Organizations: No    Attends Engineer, structural: Not on file    Marital Status: Patient declined    Allergies:  Allergies  Allergen Reactions   Penicillins Anaphylaxis    12/2014: received ancef  w/o complications  Has patient had a PCN reaction causing immediate rash,  facial/tongue/throat swelling, SOB or lightheadedness with hypotension: Yes Has patient had a PCN reaction causing severe rash involving mucus membranes or skin necrosis: Yes Has patient had a PCN reaction that required hospitalization Yes Has patient had a PCN reaction occurring within the last 10 years: No If all of the above answers are NO, then may proceed with Cephalosporin use.    Metronidazole  Nausea And Vomiting    Metabolic Disorder Labs: Lab Results  Component Value Date   HGBA1C 6.0 (H) 05/05/2019   No results found for: PROLACTIN No results found for: CHOL, TRIG, HDL, CHOLHDL, VLDL, LDLCALC No results found for: TSH  Therapeutic Level Labs: No results found for: LITHIUM No results found for: VALPROATE No results found for: CBMZ  Current Medications: Current Outpatient Medications  Medication Sig Dispense Refill   DULoxetine (CYMBALTA) 30 MG capsule Take 30 mg by mouth daily.     fluticasone  (FLONASE ) 50 MCG/ACT nasal spray Place 2 sprays into both nostrils daily. 16 g 6   hydrOXYzine  (ATARAX ) 25 MG tablet Take 1 tablet (25 mg total) by mouth 3 (three) times daily as needed. 90 tablet 0   ibuprofen  (ADVIL ) 800 MG tablet Take 800 mg by mouth every 6 (six) hours as needed.     loperamide  (IMODIUM  A-D) 2 MG tablet Take 1 tablet (2 mg total) by mouth 4 (four) times daily as  needed for diarrhea or loose stools. 30 tablet 0   OLANZapine  (ZYPREXA ) 7.5 MG tablet Take 1 tablet (7.5 mg total) by mouth at bedtime. 30 tablet 0   ondansetron  (ZOFRAN -ODT) 4 MG disintegrating tablet Take 1-2 tablets (4-8 mg total) by mouth every 8 (eight) hours as needed. 20 tablet 0   No current facility-administered medications for this visit.     Musculoskeletal: Strength & Muscle Tone: within normal limits Gait & Station: normal Patient leans: N/A  Psychiatric Specialty Exam: Review of Systems  Constitutional: Negative.   HENT: Negative.    Eyes: Negative.   Respiratory: Negative.    Cardiovascular: Negative.   Gastrointestinal: Negative.   Endocrine: Negative.   Genitourinary: Negative.   Musculoskeletal: Negative.   Skin: Negative.   Allergic/Immunologic: Negative.   Neurological: Negative.   Hematological: Negative.   Psychiatric/Behavioral:  Positive for decreased concentration, dysphoric mood, hallucinations and sleep disturbance. The patient is nervous/anxious.     Blood pressure (!) 139/93, pulse 94, temperature 98.2 F (36.8 C), temperature source Temporal, height 5' 6 (1.676 m), weight 175 lb 6.4 oz (79.6 kg), last menstrual period 06/13/2023.Body mass index is 28.31 kg/m.  General Appearance: Well Groomed  Eye Contact:  Good  Speech:  Clear and Coherent  Volume:  Normal  Mood:  Dysphoric  Affect:  Depressed  Thought Process:  Coherent  Orientation:  Full (Time, Place, and Person)  Thought Content: Hallucinations: Auditory Visual   Suicidal Thoughts:  No  Homicidal Thoughts:  No  Memory:  Immediate;   Good Recent;   Good Remote;   Good  Judgement:  Fair  Insight:  Fair  Psychomotor Activity:  Normal  Concentration:  Concentration: Good and Attention Span: Good  Recall:  Good  Fund of Knowledge: Good  Language: Good  Akathisia:  No  Handed:  Right  AIMS (if indicated):   Assets:  Desire for Improvement Financial Resources/Insurance Housing   ADL's:  Intact  Cognition: WNL  Sleep:  Good   Screenings: GAD-7    Flowsheet Row Video Visit from 06/21/2023 in [facility] Video Visit from  06/08/2023 in [facility] Office Visit from 01/22/2023 in [facility] Office Visit from 10/20/2022 in [facility] Office Visit from 09/08/2022 in [facility]  Total GAD-7 Score 20 21 18 18 19    EYV7-0    Flowsheet Row Video Visit from 06/21/2023 in [facility] Video Visit from 06/08/2023 in [facility] Office Visit from 01/22/2023 in [facility] Office Visit from 10/20/2022 in [facility] Office Visit from 09/08/2022 in [facility]  PHQ-2 Total Score 5 6 5 5 4   PHQ-9 Total Score 18 24 20 19 14    Flowsheet Row UC from 07/04/2023 in [facility]  Video Visit from 06/21/2023 in [facility] Video Visit from 06/08/2023 in [facility]  C-SSRS RISK CATEGORY No Risk No Risk No Risk     Assessment and Plan:  Assessment - Diagnosis: Bipolar 1 disorder with moderate mania (HCC) [F31.12]  2. GAD (generalized anxiety disorder) [F41.1]  3. Panic attacks [F41.0]  4. Insomnia due to other mental disorder [F51.05, F99]  5. Night terrors, adult [F51.4]  Differential -Schizophrenia - Progress: Patient reports that no improvement in sleep and she has not been sleeping for the last 5 days.  Patient reports she still experiencing hallucinations throughout the day and is intensifying due to not getting sleep. - Risk Factors: Worsening symptoms  Plan - Medications:  Continue taking duloxetine 60 mg once a day, prescribed by the previous  provider Stop Zyprexa , pt stoipped taking Start Abilify  5mg , once a day. Continue Hydroxyzine  25mg  TID as need for anxiety. Pt to be referred to higher level of care.  - Psychotherapy: Currently seeing a therapist, with goals of irritability and anger with relationships.  - Education: Patient was educated on panic disorder as well as anxiety, with education at the clinic does not provide benzodiazepines in which she was in agreement.  Patient also was educated on medication purpose, side effects and adverse reactions.   - Follow-Up: Patient referred to higher level of care.  - Referrals: Patient was provided referral list for therapy - Safety Planning: The patient has been educated, if they should have suicidal thoughts with or without a plan to call 911, or go to the closest emergency department.  Pt verbalized understanding.  Pt denies firearms within the home.  Pt also agrees to call the clinic should they have worsening symptoms before the next appointment.     Patient/Guardian was advised Release of Information must be obtained prior to any record release in order to collaborate their care with an outside provider. Patient/Guardian was advised if they have not already done so to contact the registration department to sign all necessary forms in order for us  to release information regarding their care.   Consent: Patient/Guardian gives verbal consent for treatment and assignment of benefits for services provided during this visit. Patient/Guardian expressed understanding and agreed to proceed.    Dorn Jama Der, NP 07/14/2023, 4:08 PM

## 2023-07-16 ENCOUNTER — Ambulatory Visit
Admission: RE | Admit: 2023-07-16 | Discharge: 2023-07-16 | Disposition: A | Discharge status: Home / Self Care | Payer: MEDICAID | Source: Ambulatory Visit | Attending: Physician Assistant | Admitting: Physician Assistant

## 2023-07-16 ENCOUNTER — Encounter: Payer: Self-pay | Admitting: Family Medicine

## 2023-07-16 ENCOUNTER — Telehealth: Payer: Self-pay | Admitting: Psychiatry

## 2023-07-16 VITALS — BP 148/90 | HR 100 | Temp 98.0°F | Resp 18

## 2023-07-16 DIAGNOSIS — N898 Other specified noninflammatory disorders of vagina: Secondary | ICD-10-CM | POA: Insufficient documentation

## 2023-07-16 DIAGNOSIS — Z113 Encounter for screening for infections with a predominantly sexual mode of transmission: Secondary | ICD-10-CM | POA: Insufficient documentation

## 2023-07-16 DIAGNOSIS — F410 Panic disorder [episodic paroxysmal anxiety] without agoraphobia: Secondary | ICD-10-CM

## 2023-07-16 DIAGNOSIS — F331 Major depressive disorder, recurrent, moderate: Secondary | ICD-10-CM

## 2023-07-16 NOTE — Discharge Instructions (Signed)
 Monitor your MyChart for results.  We will contact you if anything is positive you will need to arrange treatment.  Wear loosefitting cotton underwear and use hypoallergenic soaps and detergents.  If you develop any pelvic pain, abdominal pain, fever, nausea, vomiting, urinary symptoms you should return for reevaluation.  Abstain from sex until you receive the results.

## 2023-07-16 NOTE — ED Triage Notes (Signed)
 Patient to Urgent Care for STD testing.   Reports vaginal discharge and lower abdominal pain x2 days. Denies any urinary symptoms.   Taking ibuprofen .

## 2023-07-16 NOTE — Telephone Encounter (Signed)
 Patient called requesting disability paperwork. Discussed with patient she has to be established 6 months before any paperwork can be completed. Also advised her regarding transfer of care and she will need to look outside of [facility] for a new provider due to higher level of care and attendance. She voiced understanding.
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
## 2023-07-19 NOTE — Addendum Note (Signed)
 Addended by: EDMAN MARSA PARAS on: 07/19/2023 05:30 PM   Modules accepted: Orders

## 2023-07-20 ENCOUNTER — Ambulatory Visit (HOSPITAL_COMMUNITY): Payer: Self-pay

## 2023-07-20 LAB — CERVICOVAGINAL ANCILLARY ONLY
Bacterial Vaginitis (gardnerella): NEGATIVE
Candida Glabrata: NEGATIVE
Candida Vaginitis: POSITIVE — AB
Chlamydia: NEGATIVE
Comment: NEGATIVE
Comment: NEGATIVE
Comment: NEGATIVE
Comment: NEGATIVE
Comment: NEGATIVE
Comment: NORMAL
Neisseria Gonorrhea: NEGATIVE
Trichomonas: NEGATIVE

## 2023-07-20 MED ORDER — FLUCONAZOLE 150 MG PO TABS: 150.0000 mg | ORAL_TABLET | Freq: Once | ORAL | 0 refills | Status: AC

## 2023-07-26 ENCOUNTER — Other Ambulatory Visit: Payer: Self-pay

## 2023-07-26 DIAGNOSIS — Z0000 Encounter for general adult medical examination without abnormal findings: Secondary | ICD-10-CM

## 2023-07-26 DIAGNOSIS — R030 Elevated blood-pressure reading, without diagnosis of hypertension: Secondary | ICD-10-CM

## 2023-07-26 DIAGNOSIS — F331 Major depressive disorder, recurrent, moderate: Secondary | ICD-10-CM

## 2023-07-26 DIAGNOSIS — R7309 Other abnormal glucose: Secondary | ICD-10-CM

## 2023-07-26 DIAGNOSIS — F410 Panic disorder [episodic paroxysmal anxiety] without agoraphobia: Secondary | ICD-10-CM

## 2023-07-26 DIAGNOSIS — Z13220 Encounter for screening for lipoid disorders: Secondary | ICD-10-CM

## 2023-07-28 ENCOUNTER — Other Ambulatory Visit: Payer: Self-pay | Admitting: Psychiatry

## 2023-07-28 ENCOUNTER — Telehealth: Payer: Self-pay

## 2023-07-28 DIAGNOSIS — F411 Generalized anxiety disorder: Secondary | ICD-10-CM

## 2023-07-28 DIAGNOSIS — F410 Panic disorder [episodic paroxysmal anxiety] without agoraphobia: Secondary | ICD-10-CM

## 2023-07-28 MED ORDER — HYDROXYZINE HCL 25 MG PO TABS: 25.0000 mg | ORAL_TABLET | Freq: Three times a day (TID) | ORAL | 0 refills | Status: AC | PRN

## 2023-07-28 NOTE — Telephone Encounter (Signed)
 received fax requesting a refill on the hydroxyzine . pt was last seen on 6-25 no follow up appt made.

## 2023-07-28 NOTE — Telephone Encounter (Signed)
 Pt.notified

## 2023-08-03 ENCOUNTER — Ambulatory Visit: Payer: 59 | Admitting: Family Medicine

## 2023-08-04 ENCOUNTER — Ambulatory Visit: Payer: Self-pay | Admitting: Family Medicine

## 2023-08-10 ENCOUNTER — Ambulatory Visit (INDEPENDENT_AMBULATORY_CARE_PROVIDER_SITE_OTHER): Payer: MEDICAID | Admitting: Family Medicine

## 2023-08-10 VITALS — BP 130/80 | HR 88 | Ht 66.0 in | Wt 177.2 lb

## 2023-08-10 DIAGNOSIS — F410 Panic disorder [episodic paroxysmal anxiety] without agoraphobia: Secondary | ICD-10-CM

## 2023-08-10 DIAGNOSIS — Z13220 Encounter for screening for lipoid disorders: Secondary | ICD-10-CM

## 2023-08-10 DIAGNOSIS — R030 Elevated blood-pressure reading, without diagnosis of hypertension: Secondary | ICD-10-CM

## 2023-08-10 DIAGNOSIS — R7309 Other abnormal glucose: Secondary | ICD-10-CM

## 2023-08-10 DIAGNOSIS — Z1231 Encounter for screening mammogram for malignant neoplasm of breast: Secondary | ICD-10-CM

## 2023-08-10 DIAGNOSIS — F333 Major depressive disorder, recurrent, severe with psychotic symptoms: Secondary | ICD-10-CM | POA: Diagnosis not present

## 2023-08-10 DIAGNOSIS — Z0000 Encounter for general adult medical examination without abnormal findings: Secondary | ICD-10-CM

## 2023-08-10 DIAGNOSIS — F5104 Psychophysiologic insomnia: Secondary | ICD-10-CM

## 2023-08-10 DIAGNOSIS — F411 Generalized anxiety disorder: Secondary | ICD-10-CM | POA: Diagnosis not present

## 2023-08-10 NOTE — Progress Notes (Signed)
 Subjective:    Patient ID: Diamond Sutton, female    DOB: [birth year], 38 y.o.   MRN: [id number]  Diamond Sutton is a 38 y.o. female presenting on 08/10/2023 for Annual Exam   HPI  Discussed the use of AI scribe software for clinical note transcription with the patient, who gave verbal consent to proceed.  History of Present Illness   Diamond Sutton is a 38 year old female who presents for an annual physical exam. She is accompanied by her father.  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 - Anxiety and depression with ongoing symptoms - Difficulty focusing, jitteriness, paranoia, and panic attacks - Auditory and visual hallucinations - Symptoms impact ability to work and interact with others - Requires reminders from her father for appointments and medication - Currently without a psychiatrist; previous psychiatrist recommended higher level of care but did not provide referral - Upcoming appointment with new psychiatrist at [facility] in August On Duloxetine 30mg  daily, Abilify  5mg , Olanzapine  7.5mg , Hydroxyzine  PRN  Long Term Disability claim process - Mental health symptoms are the primary reason for disability claim - Requires input from healthcare providers for claim documentation  Preventive health maintenance - Present for annual physical examination - Has not completed blood work due to confusion with multiple appointments - Fasted since last night in preparation for blood work       Elevated BP without HYPERTENSION dx The patient also reports an increase in salt intake, which is unusual for her typical diet. This dietary change coincides with elevated blood pressure readings, which the patient has been monitoring at home. The patient attributes the high blood pressure to her current state of anxiety.  Psychiatry scheduled for August 2025 with [facility]  Maintenance: Order Mammogram initial age 63+ Updated pap smear per OBGYN     08/10/2023    3:03 PM 07/14/2023    4:35 PM 06/21/2023    2:39 PM  Depression screen PHQ 2/9  Decreased Interest 3    Down, Depressed, Hopeless 3    PHQ - 2 Score 6    Altered sleeping 3    Tired, decreased energy 3    Change in appetite 3    Feeling bad or failure about yourself  2    Trouble concentrating 3    Moving slowly or fidgety/restless 2    Suicidal thoughts 0    PHQ-9 Score 22    Difficult doing work/chores Extremely dIfficult       Information is confidential and restricted. Go to Review Flowsheets to unlock data.       08/10/2023    3:04 PM 07/14/2023    4:36 PM 06/21/2023    2:40 PM 06/08/2023    3:19 PM  GAD 7 : Generalized Anxiety Score  Nervous, Anxious, on Edge 3     Control/stop worrying 3     Worry too much - different things 3     Trouble relaxing 3     Restless 1     Easily annoyed or irritable 3     Afraid - awful might happen 3     Total GAD 7 Score 19     Anxiety Difficulty Extremely difficult        Information is confidential and restricted. Go to Review Flowsheets to unlock data.    Social History   Tobacco Use   Smoking status: Every Day    Current  packs/day: 0.05    Average packs/day: 0.1 packs/day for 15.0 years (0.8 ttl pk-yrs)    Types: Cigarettes   Smokeless tobacco: Never  Vaping Use   Vaping status: Never Used  Substance Use Topics   Alcohol use: Yes    Alcohol/week: 6.0 standard drinks of alcohol    Types: 4 Shots of liquor, 2 Standard drinks or equivalent per week    Comment: ocassional   Drug use: Never    Review of Systems  Constitutional:  Negative for activity change, appetite change, chills, diaphoresis, fatigue and fever.  HENT:  Negative for congestion and hearing loss.   Eyes:  Negative for visual disturbance.  Respiratory:  Negative for cough, chest tightness, shortness of breath and wheezing.   Cardiovascular:  Negative for chest  pain, palpitations and leg swelling.  Gastrointestinal:  Negative for abdominal pain, constipation, diarrhea, nausea and vomiting.  Genitourinary:  Negative for dysuria, frequency and hematuria.  Musculoskeletal:  Negative for arthralgias and neck pain.  Skin:  Negative for rash.  Neurological:  Negative for dizziness, weakness, light-headedness, numbness and headaches.  Hematological:  Negative for adenopathy.  Psychiatric/Behavioral:  Positive for decreased concentration, dysphoric mood, hallucinations and sleep disturbance. Negative for behavioral problems. The patient is nervous/anxious.    Per HPI unless specifically indicated above     Objective:    BP 130/80 (BP Location: Left Arm, Patient Position: Sitting, Cuff Size: Normal)   Pulse 88   Ht 5' 6 (1.676 m)   Wt 177 lb 4 oz (80.4 kg)   LMP 06/28/2023   BMI 28.61 kg/m   Wt Readings from Last 3 Encounters:  08/10/23 177 lb 4 oz (80.4 kg)  01/22/23 171 lb (77.6 kg)  10/20/22 171 lb (77.6 kg)    Physical Exam Vitals and nursing note reviewed.  Constitutional:      General: She is not in acute distress.    Appearance: She is well-developed. She is not diaphoretic.     Comments: Well-appearing, comfortable, cooperative  HENT:     Head: Normocephalic and atraumatic.  Eyes:     General:        Right eye: No discharge.        Left eye: No discharge.     Conjunctiva/sclera: Conjunctivae normal.     Pupils: Pupils are equal, round, and reactive to light.  Neck:     Thyroid: No thyromegaly.     Vascular: No carotid bruit.  Cardiovascular:     Rate and Rhythm: Normal rate and regular rhythm.     Pulses: Normal pulses.     Heart sounds: Normal heart sounds. No murmur heard. Pulmonary:     Effort: Pulmonary effort is normal. No respiratory distress.     Breath sounds: Normal breath sounds. No wheezing or rales.  Abdominal:     General: Bowel sounds are normal. There is no distension.     Palpations: Abdomen is soft. There  is no mass.     Tenderness: There is no abdominal tenderness.  Musculoskeletal:        General: No tenderness. Normal range of motion.     Cervical back: Normal range of motion and neck supple.     Right lower leg: No edema.     Left lower leg: No edema.     Comments: Upper / Lower Extremities: - Normal muscle tone, strength bilateral upper extremities 5/5, lower extremities 5/5  Lymphadenopathy:     Cervical: No cervical adenopathy.  Skin:    General:  Skin is warm and dry.     Findings: No erythema or rash.  Neurological:     Mental Status: She is alert and oriented to person, place, and time.     Comments: Distal sensation intact to light touch all extremities  Psychiatric:        Mood and Affect: Mood normal.        Behavior: Behavior normal.        Thought Content: Thought content normal.     Comments: Well groomed, good eye contact, normal speech and thoughts     Results for orders placed or performed during the hospital encounter of 07/16/23  Cervicovaginal ancillary only   Collection Time: 07/16/23  1:33 PM  Result Value Ref Range   Neisseria Gonorrhea Negative    Chlamydia Negative    Trichomonas Negative    Bacterial Vaginitis (gardnerella) Negative    Candida Vaginitis Positive (A)    Candida Glabrata Negative    Comment      Normal Reference Range Bacterial Vaginosis - Negative   Comment Normal Reference Range Candida Species - Negative    Comment Normal Reference Range Candida Galbrata - Negative    Comment Normal Reference Range Trichomonas - Negative    Comment Normal Reference Ranger Chlamydia - Negative    Comment      Normal Reference Range Neisseria Gonorrhea - Negative      Assessment & Plan:   Problem List Items Addressed This Visit     Elevated BP without diagnosis of hypertension   Relevant Orders   CBC with Differential/Platelet   Comprehensive metabolic panel with GFR   Generalized anxiety disorder with panic attacks   Relevant Orders    Comprehensive metabolic panel with GFR   Major depressive disorder, recurrent, moderate (HCC)   Psychophysiologic insomnia   Relevant Orders   Comprehensive metabolic panel with GFR   Other Visit Diagnoses       Annual physical exam    -  Primary   Relevant Orders   TSH   Lipid panel   Hemoglobin A1c   CBC with Differential/Platelet   Comprehensive metabolic panel with GFR     Abnormal glucose       Relevant Orders   Hemoglobin A1c     Screening, lipid       Relevant Orders   Lipid panel     Encounter for screening mammogram for malignant neoplasm of breast       Relevant Orders   MM 3D SCREENING MAMMOGRAM BILATERAL BREAST       Generalized Anxiety Disorder with Panic Attacks Severe Major Depression recurrent, 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 Previous psychiatric care was inadequate and she was advised to establish with other psychiatry location. Request her to email disability paperwork to our office so we can print and completed it. - Follow up with new psychiatrist in August [facility]  Annual Physical Examination Routine annual physical for a 38 year old female. Blood work was not completed previously. She is fasting and eligible for blood tests today. Vaccinations and screenings are current. - Order blood tests including cholesterol, glucose, renal function, thyroid function, and CBC.  General Health Maintenance Eligible for a mammogram at age 55. Pap smear completed in February by OBGYN.  Colon cancer screening due at age 13. No vaccines needed. - Order mammogram and provide contact information  to schedule it.       Orders Placed This Encounter  Procedures   MM 3D SCREENING MAMMOGRAM BILATERAL BREAST    Standing Status:   Future    Expiration Date:    08/09/2024    Reason for Exam (SYMPTOM  OR DIAGNOSIS REQUIRED):   Screening bilateral 3D Mammogram Tomo    Preferred imaging location?:   [facility]   TSH   Lipid panel    Has the patient fasted?:   Yes   Hemoglobin A1c   CBC with Differential/Platelet   Comprehensive metabolic panel with GFR    Has the patient fasted?:   Yes    No orders of the defined types were placed in this encounter.   Follow up plan: Return if symptoms worsen or fail to improve.   Marsa Officer, DO [facility] 08/10/2023, 3:16 PM

## 2023-08-10 NOTE — Patient Instructions (Addendum)
 Thank you for coming to the office today.  For Mammogram screening for breast cancer   Call the Imaging Center below anytime to schedule your own appointment now that order has been placed.  [facility] [address], [address] [place], [place] [address] Phone: [phone]  Labs today  I will complete the disability paperwork.  Please schedule a Follow-up Appointment to: Return if symptoms worsen or fail to improve.  If you have any other questions or concerns, please feel free to call the office or send a message through MyChart. You may also schedule an earlier appointment if necessary.  Additionally, you may be receiving a survey about your experience at our office within a few days to 1 week by e-mail or mail. We value your feedback.  Marsa Officer, DO [facility], [place]

## 2023-08-11 ENCOUNTER — Other Ambulatory Visit: Payer: Self-pay | Admitting: Medical Genetics

## 2023-08-11 LAB — CBC WITH DIFFERENTIAL/PLATELET
Absolute Lymphocytes: 2849 {cells}/uL (ref 850–3900)
Absolute Monocytes: 880 {cells}/uL (ref 200–950)
Basophils Absolute: 55 {cells}/uL (ref 0–200)
Basophils Relative: 0.5 %
Eosinophils Absolute: 110 {cells}/uL (ref 15–500)
Eosinophils Relative: 1 %
HCT: 36.4 % (ref 35.0–45.0)
Hemoglobin: 11.8 g/dL (ref 11.7–15.5)
MCH: 30 pg (ref 27.0–33.0)
MCHC: 32.4 g/dL (ref 32.0–36.0)
MCV: 92.6 fL (ref 80.0–100.0)
MPV: 10.6 fL (ref 7.5–12.5)
Monocytes Relative: 8 %
Neutro Abs: 7106 {cells}/uL (ref 1500–7800)
Neutrophils Relative %: 64.6 %
Platelets: 326 Thousand/uL (ref 140–400)
RBC: 3.93 Million/uL (ref 3.80–5.10)
RDW: 13.4 % (ref 11.0–15.0)
Total Lymphocyte: 25.9 %
WBC: 11 Thousand/uL — ABNORMAL HIGH (ref 3.8–10.8)

## 2023-08-11 LAB — LIPID PANEL
Cholesterol: 125 mg/dL (ref <200)
HDL: 56 mg/dL (ref >=50)
LDL Cholesterol (Calc): 53 mg/dL
Non-HDL Cholesterol (Calc): 69 mg/dL (ref <130)
Total CHOL/HDL Ratio: 2.2 (calc) (ref <5.0)
Triglycerides: 76 mg/dL (ref <150)

## 2023-08-11 LAB — COMPREHENSIVE METABOLIC PANEL WITH GFR
AG Ratio: 1.9 (calc) (ref 1.0–2.5)
ALT: 14 U/L (ref 6–29)
AST: 14 U/L (ref 10–30)
Albumin: 4.5 g/dL (ref 3.6–5.1)
Alkaline phosphatase (APISO): 57 U/L (ref 31–125)
BUN: 13 mg/dL (ref 7–25)
CO2: 26 mmol/L (ref 20–32)
Calcium: 9.4 mg/dL (ref 8.6–10.2)
Chloride: 107 mmol/L (ref 98–110)
Creat: 0.82 mg/dL (ref 0.50–0.99)
Globulin: 2.4 g/dL (ref 1.9–3.7)
Glucose, Bld: 86 mg/dL (ref 65–99)
Potassium: 4 mmol/L (ref 3.5–5.3)
Sodium: 142 mmol/L (ref 135–146)
Total Bilirubin: 0.3 mg/dL (ref 0.2–1.2)
Total Protein: 6.9 g/dL (ref 6.1–8.1)
eGFR: 93 mL/min/1.73m2 (ref >=60)

## 2023-08-11 LAB — HEMOGLOBIN A1C
Hgb A1c MFr Bld: 6 % — ABNORMAL HIGH (ref <5.7)
Mean Plasma Glucose: 126 mg/dL
eAG (mmol/L): 7 mmol/L

## 2023-08-11 LAB — TSH: TSH: 1.23 m[IU]/L

## 2023-08-12 ENCOUNTER — Ambulatory Visit: Payer: Self-pay | Admitting: Family Medicine

## 2023-08-14 ENCOUNTER — Telehealth: Payer: MEDICAID | Admitting: Family Medicine

## 2023-08-14 DIAGNOSIS — B3731 Acute candidiasis of vulva and vagina: Secondary | ICD-10-CM | POA: Diagnosis not present

## 2023-08-14 DIAGNOSIS — B9689 Other specified bacterial agents as the cause of diseases classified elsewhere: Secondary | ICD-10-CM

## 2023-08-14 DIAGNOSIS — N760 Acute vaginitis: Secondary | ICD-10-CM | POA: Diagnosis not present

## 2023-08-14 MED ORDER — FLUCONAZOLE 150 MG PO TABS: 150.0000 mg | ORAL_TABLET | Freq: Every day | ORAL | 0 refills | Status: AC

## 2023-08-14 MED ORDER — CLINDAMYCIN HCL 300 MG PO CAPS: 300.0000 mg | ORAL_CAPSULE | Freq: Two times a day (BID) | ORAL | 0 refills | Status: AC

## 2023-08-14 NOTE — Progress Notes (Signed)
 E-Visit for Vaginal Symptoms  We are sorry that you are not feeling well. Here is how we plan to help! Based on what you shared with me it looks like you: May have a vaginosis due to bacteria  Vaginosis is an inflammation of the vagina that can result in discharge, itching and pain. The cause is usually a change in the normal balance of vaginal bacteria or an infection. Vaginosis can also result from reduced estrogen levels after menopause.  The most common causes of vaginosis are:   Bacterial vaginosis which results from an overgrowth of one on several organisms that are normally present in your vagina.   Yeast infections which are caused by a naturally occurring fungus called candida.   Vaginal atrophy (atrophic vaginosis) which results from the thinning of the vagina from reduced estrogen levels after menopause.   Trichomoniasis which is caused by a parasite and is commonly transmitted by sexual intercourse.  Factors that increase your risk of developing vaginosis include: Medications, such as antibiotics and steroids Uncontrolled diabetes Use of hygiene products such as bubble bath, vaginal spray or vaginal deodorant Douching Wearing damp or tight-fitting clothing Using an intrauterine device (IUD) for birth control Hormonal changes, such as those associated with pregnancy, birth control pills or menopause Sexual activity Having a sexually transmitted infection  Your treatment plan is clindamycin  300 mg po bid for 7 days and a diflucan .   Be sure to take all of the medication as directed. Stop taking any medication if you develop a rash, tongue swelling or shortness of breath. Mothers who are breast feeding should consider pumping and discarding their breast milk while on these antibiotics. However, there is no consensus that infant exposure at these doses would be harmful.  Remember that medication creams can weaken latex condoms. SABRA   HOME CARE:  Good hygiene may prevent  some types of vaginosis from recurring and may relieve some symptoms:  Avoid baths, hot tubs and whirlpool spas. Rinse soap from your outer genital area after a shower, and dry the area well to prevent irritation. Don't use scented or harsh soaps, such as those with deodorant or antibacterial action. Avoid irritants. These include scented tampons and pads. Wipe from front to back after using the toilet. Doing so avoids spreading fecal bacteria to your vagina.  Other things that may help prevent vaginosis include:  Don't douche. Your vagina doesn't require cleansing other than normal bathing. Repetitive douching disrupts the normal organisms that reside in the vagina and can actually increase your risk of vaginal infection. Douching won't clear up a vaginal infection. Use a latex condom. Both female and female latex condoms may help you avoid infections spread by sexual contact. Wear cotton underwear. Also wear pantyhose with a cotton crotch. If you feel comfortable without it, skip wearing underwear to bed. Yeast thrives in [employer] Your symptoms should improve in the next day or two.  GET HELP RIGHT AWAY IF:  You have pain in your lower abdomen ( pelvic area or over your ovaries) You develop nausea or vomiting You develop a fever Your discharge changes or worsens You have persistent pain with intercourse You develop shortness of breath, a rapid pulse, or you faint.  These symptoms could be signs of problems or infections that need to be evaluated by a medical provider now.  MAKE SURE YOU   Understand these instructions. Will watch your condition. Will get help right away if you are not doing well or get worse.  Thank  you for choosing an e-visit.  Your e-visit answers were reviewed by a board certified advanced clinical practitioner to complete your personal care plan. Depending upon the condition, your plan could have included both over the counter or prescription  medications.  Please review your pharmacy choice. Make sure the pharmacy is open so you can pick up prescription now. If there is a problem, you may contact your provider through [employer] and have the prescription routed to another pharmacy.  Your safety is important to us . If you have drug allergies check your prescription carefully.   For the next 24 hours you can use MyChart to ask questions about today's visit, request a non-urgent call back, or ask for a work or school excuse. You will get an email in the next two days asking about your experience. I hope that your e-visit has been valuable and will speed your recovery.    have provided 5 minutes of non face to face time during this encounter for chart review and documentation.

## 2023-08-17 ENCOUNTER — Other Ambulatory Visit: Payer: MEDICAID

## 2023-09-16 ENCOUNTER — Other Ambulatory Visit: Payer: Self-pay

## 2023-09-16 ENCOUNTER — Emergency Department: Payer: MEDICAID

## 2023-09-16 ENCOUNTER — Emergency Department
Admission: EM | Admit: 2023-09-16 | Discharge: 2023-09-16 | Disposition: A | Discharge status: Home / Self Care | Payer: MEDICAID | Attending: Emergency Medicine | Admitting: Emergency Medicine

## 2023-09-16 ENCOUNTER — Encounter: Payer: Self-pay | Admitting: Emergency Medicine

## 2023-09-16 DIAGNOSIS — I10 Essential (primary) hypertension: Secondary | ICD-10-CM | POA: Diagnosis not present

## 2023-09-16 DIAGNOSIS — E876 Hypokalemia: Secondary | ICD-10-CM | POA: Diagnosis not present

## 2023-09-16 DIAGNOSIS — R0789 Other chest pain: Secondary | ICD-10-CM

## 2023-09-16 HISTORY — DX: Depression, unspecified: F32.A

## 2023-09-16 HISTORY — DX: Anxiety disorder, unspecified: F41.9

## 2023-09-16 LAB — POC URINE PREG, ED: Preg Test, Ur: NEGATIVE

## 2023-09-16 LAB — CBC
HCT: 37.8 % (ref 36.0–46.0)
Hemoglobin: 12.8 g/dL (ref 12.0–15.0)
MCH: 29.8 pg (ref 26.0–34.0)
MCHC: 33.9 g/dL (ref 30.0–36.0)
MCV: 87.9 fL (ref 80.0–100.0)
Platelets: 337 K/uL (ref 150–400)
RBC: 4.3 MIL/uL (ref 3.87–5.11)
RDW: 13.2 % (ref 11.5–15.5)
WBC: 9.7 K/uL (ref 4.0–10.5)
nRBC: 0 % (ref 0.0–0.2)

## 2023-09-16 LAB — COMPREHENSIVE METABOLIC PANEL WITH GFR
ALT: 19 U/L (ref 0–44)
AST: 17 U/L (ref 15–41)
Albumin: 4.2 g/dL (ref 3.5–5.0)
Alkaline Phosphatase: 58 U/L (ref 38–126)
Anion gap: 11 (ref 5–15)
BUN: 9 mg/dL (ref 6–20)
CO2: 24 mmol/L (ref 22–32)
Calcium: 8.9 mg/dL (ref 8.9–10.3)
Chloride: 104 mmol/L (ref 98–111)
Creatinine, Ser: 0.6 mg/dL (ref 0.44–1.00)
GFR, Estimated: >60 mL/min (ref >60)
Glucose, Bld: 102 mg/dL — ABNORMAL HIGH (ref 70–99)
Potassium: 2.9 mmol/L — ABNORMAL LOW (ref 3.5–5.1)
Sodium: 139 mmol/L (ref 135–145)
Total Bilirubin: 0.6 mg/dL (ref 0.0–1.2)
Total Protein: 7.6 g/dL (ref 6.5–8.1)

## 2023-09-16 LAB — LIPASE, BLOOD: Lipase: 32 U/L (ref 11–51)

## 2023-09-16 LAB — URINALYSIS, ROUTINE W REFLEX MICROSCOPIC
Bilirubin Urine: NEGATIVE
Glucose, UA: NEGATIVE mg/dL
Hgb urine dipstick: NEGATIVE
Ketones, ur: 20 mg/dL — AB
Leukocytes,Ua: NEGATIVE
Nitrite: NEGATIVE
Protein, ur: NEGATIVE mg/dL
Specific Gravity, Urine: 1.006 (ref 1.005–1.030)
pH: 6 (ref 5.0–8.0)

## 2023-09-16 LAB — TROPONIN I (HIGH SENSITIVITY): Troponin I (High Sensitivity): 5 ng/L (ref <18)

## 2023-09-16 MED ORDER — KETOROLAC TROMETHAMINE 30 MG/ML IJ SOLN
30.0000 mg | Freq: Once | INTRAMUSCULAR | Status: AC
  Administered 2023-09-16: 30 mg via INTRAMUSCULAR
  Filled 2023-09-16: qty 1

## 2023-09-16 MED ORDER — NAPROXEN 500 MG PO TABS: 500.0000 mg | ORAL_TABLET | Freq: Two times a day (BID) | ORAL | 2 refills | Status: DC

## 2023-09-16 MED ORDER — AMLODIPINE BESYLATE 5 MG PO TABS
5.0000 mg | ORAL_TABLET | Freq: Once | ORAL | Status: AC
  Administered 2023-09-16: 5 mg via ORAL
  Filled 2023-09-16: qty 1

## 2023-09-16 MED ORDER — AMLODIPINE BESYLATE 5 MG PO TABS: 5.0000 mg | ORAL_TABLET | Freq: Every day | ORAL | 11 refills | Status: AC

## 2023-09-16 MED ORDER — NAPROXEN 500 MG PO TABS
500.0000 mg | ORAL_TABLET | Freq: Once | ORAL | Status: AC
  Administered 2023-09-16: 500 mg via ORAL
  Filled 2023-09-16: qty 1

## 2023-09-16 NOTE — ED Triage Notes (Signed)
 Patient to ED via POV for left sided CP. Ongoing x2 days. Pain worse when laying down. Non-radiating. Denies cardiac hx.

## 2023-09-16 NOTE — ED Provider Notes (Signed)
 [facility] Provider Note    Event Date/Time   First MD Initiated Contact with Patient 09/16/23 1409     (approximate)   History   Chest Pain   HPI  Diamond Sutton is a 38 y.o. female who presents with complaints of chest discomfort which has been present for about 2 days, she denies shortness of breath.  No cough fevers or chills.  No recent travel.  No calf pain or swelling.  No hormone use, she does smoke cigarettes.  No history of heart disease reported     Physical Exam   Triage Vital Signs: ED Triage Vitals  Encounter Vitals Group     BP 09/16/23 1352 (!) 185/106     Girls Systolic BP Percentile --      Girls Diastolic BP Percentile --      Boys Systolic BP Percentile --      Boys Diastolic BP Percentile --      Pulse Rate 09/16/23 1352 72     Resp 09/16/23 1352 16     Temp 09/16/23 1352 98.8 F (37.1 C)     Temp src --      SpO2 09/16/23 1352 100 %     Weight 09/16/23 1350 79.8 kg (176 lb)     Height 09/16/23 1350 1.676 m (5' 6)     Head Circumference --      Peak Flow --      Pain Score 09/16/23 1349 8     Pain Loc --      Pain Education --      Exclude from Growth Chart --     Most recent vital signs: Vitals:   09/16/23 1700 09/16/23 1715  BP: (!) 159/104   Pulse: 77 71  Resp: (!) 21 18  Temp:    SpO2: 97% 98%     General: Awake, no distress.  CV:  Good peripheral perfusion.  Regular rate and rhythm Resp:  Normal effort.  Clear to auscultation bilaterally Abd:  No distention.  Other:  No calf pain swelling or edema   ED Results / Procedures / Treatments   Labs (all labs ordered are listed, but only abnormal results are displayed) Labs Reviewed  COMPREHENSIVE METABOLIC PANEL WITH GFR - Abnormal; Notable for the following components:      Result Value   Potassium 2.9 (*)    Glucose, Bld 102 (*)    All other components within normal limits  URINALYSIS, ROUTINE W REFLEX MICROSCOPIC - Abnormal; Notable for the  following components:   Color, Urine STRAW (*)    APPearance HAZY (*)    Ketones, ur 20 (*)    All other components within normal limits  LIPASE, BLOOD  CBC  POC URINE PREG, ED  TROPONIN I (HIGH SENSITIVITY)     EKG  ED ECG REPORT I, Lamar Price, the attending physician, personally viewed and interpreted this ECG.  Date: 09/16/2023  Rhythm: normal sinus rhythm QRS Axis: normal Intervals: normal ST/T Wave abnormalities: normal Narrative Interpretation: no evidence of acute ischemia    RADIOLOGY Chest x-ray viewed interpret by me, no acute abnormality    PROCEDURES:  Critical Care performed:   Procedures   MEDICATIONS ORDERED IN ED: Medications  ketorolac  (TORADOL ) 30 MG/ML injection 30 mg (30 mg Intramuscular Given 09/16/23 1504)  amLODipine  (NORVASC ) tablet 5 mg (5 mg Oral Given 09/16/23 1729)  naproxen  (NAPROSYN ) tablet 500 mg (500 mg Oral Given 09/16/23 1729)     IMPRESSION /  MDM / ASSESSMENT AND PLAN / ED COURSE  I reviewed the triage vital signs and the nursing notes. Patient's presentation is most consistent with acute presentation with potential threat to life or bodily function.  Patient presents with chest discomfort as detailed above, differential includes ACS, chest wall pain, pneumonia.  Not consistent with PE, PERC negative  Overall well-appearing and in no acute distress, she does have high blood pressure today but reports fluctuation, on review of records last office blood pressure was 130/80.  Lungs are clear to auscultation, she is afebrile  EKG is reassuring, pending high sensitive troponin, pending chest x-ray  Chest x-ray is unremarkable, high sensitive troponin is normal.  Patient is feeling improved, she remains hypertensive here, will start her on low-dose amlodipine , have her follow-up with her PCP given multiple elevated blood pressures, cardiology referral and strict return return precautions      FINAL CLINICAL IMPRESSION(S) / ED  DIAGNOSES   Final diagnoses:  Atypical chest pain  Uncontrolled hypertension     Rx / DC Orders   ED Discharge Orders          Ordered    amLODipine  (NORVASC ) 5 MG tablet  Daily        09/16/23 1700    naproxen  (NAPROSYN ) 500 MG tablet  2 times daily with meals        09/16/23 1700    Ambulatory referral to Cardiology       Comments: If you have not heard from the Cardiology office within the next 72 hours please call [phone].   09/16/23 1700             Note:  This document was prepared using Dragon voice recognition software and may include unintentional dictation errors.   Arlander Charleston, MD 09/16/23 [phone]

## 2023-09-16 NOTE — Discharge Instructions (Addendum)
 Please check your blood pressure with Dr. Edman to determine whether you needs to stay on antihypertensives

## 2023-09-16 NOTE — ED Notes (Signed)
 Lab called to run Troponin

## 2023-09-21 ENCOUNTER — Ambulatory Visit: Payer: MEDICAID | Admitting: Family Medicine

## 2023-09-21 ENCOUNTER — Encounter: Payer: Self-pay | Admitting: Family Medicine

## 2023-09-21 VITALS — BP 124/82 | HR 88 | Ht 66.0 in | Wt 169.0 lb

## 2023-09-21 DIAGNOSIS — F411 Generalized anxiety disorder: Secondary | ICD-10-CM | POA: Diagnosis not present

## 2023-09-21 DIAGNOSIS — F333 Major depressive disorder, recurrent, severe with psychotic symptoms: Secondary | ICD-10-CM | POA: Diagnosis not present

## 2023-09-21 DIAGNOSIS — B9689 Other specified bacterial agents as the cause of diseases classified elsewhere: Secondary | ICD-10-CM

## 2023-09-21 DIAGNOSIS — F410 Panic disorder [episodic paroxysmal anxiety] without agoraphobia: Secondary | ICD-10-CM

## 2023-09-21 DIAGNOSIS — N760 Acute vaginitis: Secondary | ICD-10-CM

## 2023-09-21 DIAGNOSIS — R030 Elevated blood-pressure reading, without diagnosis of hypertension: Secondary | ICD-10-CM | POA: Diagnosis not present

## 2023-09-21 MED ORDER — CLINDAMYCIN HCL 300 MG PO CAPS: 300.0000 mg | ORAL_CAPSULE | Freq: Two times a day (BID) | ORAL | 1 refills | Status: DC

## 2023-09-21 NOTE — Patient Instructions (Addendum)
 Thank you for coming to the office today.  Refilled Clindamycin  for BV and +1 refill  Keep on Amlodipine  5mg  daily for BP - watch out for lower extremity edema swelling.  Remain off Desvenlafaxine and discuss with Psychiatry.  Please schedule a Follow-up Appointment to: Return if symptoms worsen or fail to improve.  If you have any other questions or concerns, please feel free to call the office or send a message through MyChart. You may also schedule an earlier appointment if necessary.  Additionally, you may be receiving a survey about your experience at our office within a few days to 1 week by e-mail or mail. We value your feedback.  Marsa Officer, DO [facility], [place]

## 2023-09-21 NOTE — Progress Notes (Signed)
 Subjective:    Patient ID: Diamond Sutton, female    DOB: 1985-03-02, 38 y.o.   MRN: [id number]  Diamond Sutton is a 38 y.o. female presenting on 09/21/2023 for Hospitalization Follow-up   HPI  Discussed the use of AI scribe software for clinical note transcription with the patient, who gave verbal consent to proceed.  History of Present Illness   Diamond Sutton is a 38 year old female who presents with elevated blood pressure after starting a new medication.   ED FOLLOW-UP VISIT  Hospital/Location: [facility] Date of ED Visit: 09/16/23  Reason for Presenting to ED: Hypertension  FOLLOW-UP  - ED provider note and record have been reviewed - Patient presents today about 5 days after recent ED visit.   Hypertension and chest discomfort - Acute elevation in blood pressure to 185/106 mmHg on September 16, 2023, after starting desvenlafaxine 25 mg prescribed by psychiatrist - Associated chest discomfort during hypertensive episode - Cardiac evaluation in emergency room showed no evidence of cardiac pathology - Blood pressure improved to 124/82 mmHg after discontinuation of desvenlafaxine - Currently taking amlodipine  5 mg daily, initiated during emergency room visit - No fluid retention in ankles - Chest pain attributed to chest wall pain and treated with naproxen   Adverse reaction to desvenlafaxine - Significant increase in blood pressure temporally associated with initiation of desvenlafaxine 25 mg - No prior history of similar hypertensive episodes before starting desvenlafaxine - Discontinued desvenlafaxine with subsequent improvement in blood pressure - Continues to take Abilify  10 mg without issues  Upcoming Psychiatry on 10/04/23  BV Vaginal discharge and odor - Symptoms consistent with bacterial vaginosis - Previous positive response to clindamycin , taken twice daily for seven days - Intolerance to metronidazole  due to nausea   - Today reports overall has done well  after discharge from ED. Symptoms of BP headache have resolved  - New medications on discharge: Amlodipine  5mg  - Changes to current meds on discharge: Discontinued Desvenlafaxine 25mg   I have reviewed the discharge medication list, and have reconciled the current and discharge medications today.       09/21/2023    2:02 PM 08/10/2023    3:03 PM 07/14/2023    4:35 PM  Depression screen PHQ 2/9  Decreased Interest 3 3   Down, Depressed, Hopeless 3 3   PHQ - 2 Score 6 6   Altered sleeping 3 3   Tired, decreased energy 3 3   Change in appetite 3 3   Feeling bad or failure about yourself  3 2   Trouble concentrating 3 3   Moving slowly or fidgety/restless 1 2   Suicidal thoughts 0 0   PHQ-9 Score 22 22   Difficult doing work/chores Extremely dIfficult Extremely dIfficult      Information is confidential and restricted. Go to Review Flowsheets to unlock data.       09/21/2023    2:03 PM 08/10/2023    3:04 PM 07/14/2023    4:36 PM 06/21/2023    2:40 PM  GAD 7 : Generalized Anxiety Score  Nervous, Anxious, on Edge 3 3    Control/stop worrying 3 3    Worry too much - different things 3 3    Trouble relaxing 3 3    Restless 3 1    Easily annoyed or irritable 3 3    Afraid - awful might happen 3 3    Total GAD 7 Score 21 19    Anxiety Difficulty Extremely difficult Extremely  difficult       Information is confidential and restricted. Go to Review Flowsheets to unlock data.    Social History   Tobacco Use   Smoking status: Every Day    Current packs/day: 0.05    Average packs/day: 0.1 packs/day for 15.0 years (0.8 ttl pk-yrs)    Types: Cigarettes   Smokeless tobacco: Never  Vaping Use   Vaping status: Never Used  Substance Use Topics   Alcohol use: Yes    Alcohol/week: 6.0 standard drinks of alcohol    Types: 4 Shots of liquor, 2 Standard drinks or equivalent per week    Comment: ocassional   Drug use: Never    Review of Systems Per HPI unless specifically indicated  above     Objective:    BP 124/82 (BP Location: Left Arm, Patient Position: Sitting, Cuff Size: Normal)   Pulse 88   Ht 5' 6 (1.676 m)   Wt 169 lb (76.7 kg)   LMP 08/28/2023   SpO2 95%   BMI 27.28 kg/m   Wt Readings from Last 3 Encounters:  09/21/23 169 lb (76.7 kg)  09/16/23 176 lb (79.8 kg)  08/10/23 177 lb 4 oz (80.4 kg)    Physical Exam Vitals and nursing note reviewed.  Constitutional:      General: She is not in acute distress.    Appearance: She is well-developed. She is not diaphoretic.     Comments: Well-appearing, comfortable, cooperative  HENT:     Head: Normocephalic and atraumatic.  Eyes:     General:        Right eye: No discharge.        Left eye: No discharge.     Conjunctiva/sclera: Conjunctivae normal.  Neck:     Thyroid: No thyromegaly.  Cardiovascular:     Rate and Rhythm: Normal rate and regular rhythm.     Heart sounds: Normal heart sounds. No murmur heard. Pulmonary:     Effort: Pulmonary effort is normal. No respiratory distress.     Breath sounds: Normal breath sounds. No wheezing or rales.  Musculoskeletal:        General: Normal range of motion.     Cervical back: Normal range of motion and neck supple.     Right lower leg: No edema.     Left lower leg: No edema.  Lymphadenopathy:     Cervical: No cervical adenopathy.  Skin:    General: Skin is warm and dry.     Findings: No erythema or rash.  Neurological:     Mental Status: She is alert and oriented to person, place, and time.  Psychiatric:        Behavior: Behavior normal.     Comments: Well groomed, good eye contact, normal speech and thoughts     Results for orders placed or performed during the hospital encounter of 09/16/23  Urinalysis, Routine w reflex microscopic -Urine, Clean Catch   Collection Time: 09/16/23  1:51 PM  Result Value Ref Range   Color, Urine STRAW (A) YELLOW   APPearance HAZY (A) CLEAR   Specific Gravity, Urine 1.006 1.005 - 1.030   pH 6.0 5.0 - 8.0    Glucose, UA NEGATIVE NEGATIVE mg/dL   Hgb urine dipstick NEGATIVE NEGATIVE   Bilirubin Urine NEGATIVE NEGATIVE   Ketones, ur 20 (A) NEGATIVE mg/dL   Protein, ur NEGATIVE NEGATIVE mg/dL   Nitrite NEGATIVE NEGATIVE   Leukocytes,Ua NEGATIVE NEGATIVE  Lipase, blood   Collection Time: 09/16/23  1:52 PM  Result Value Ref Range   Lipase 32 11 - 51 U/L  Comprehensive metabolic panel   Collection Time: 09/16/23  1:52 PM  Result Value Ref Range   Sodium 139 135 - 145 mmol/L   Potassium 2.9 (L) 3.5 - 5.1 mmol/L   Chloride 104 98 - 111 mmol/L   CO2 24 22 - 32 mmol/L   Glucose, Bld 102 (H) 70 - 99 mg/dL   BUN 9 6 - 20 mg/dL   Creatinine, Ser 9.39 0.44 - 1.00 mg/dL   Calcium 8.9 8.9 - 89.6 mg/dL   Total Protein 7.6 6.5 - 8.1 g/dL   Albumin 4.2 3.5 - 5.0 g/dL   AST 17 15 - 41 U/L   ALT 19 0 - 44 U/L   Alkaline Phosphatase 58 38 - 126 U/L   Total Bilirubin 0.6 0.0 - 1.2 mg/dL   GFR, Estimated >39 >39 mL/min   Anion gap 11 5 - 15  CBC   Collection Time: 09/16/23  1:52 PM  Result Value Ref Range   WBC 9.7 4.0 - 10.5 K/uL   RBC 4.30 3.87 - 5.11 MIL/uL   Hemoglobin 12.8 12.0 - 15.0 g/dL   HCT 62.1 63.9 - 53.9 %   MCV 87.9 80.0 - 100.0 fL   MCH 29.8 26.0 - 34.0 pg   MCHC 33.9 30.0 - 36.0 g/dL   RDW 86.7 88.4 - 84.4 %   Platelets 337 150 - 400 K/uL   nRBC 0.0 0.0 - 0.2 %  Troponin I (High Sensitivity)   Collection Time: 09/16/23  1:52 PM  Result Value Ref Range   Troponin I (High Sensitivity) 5 <18 ng/L  POC urine preg, ED   Collection Time: 09/16/23  3:12 PM  Result Value Ref Range   Preg Test, Ur Negative Negative      Assessment & Plan:   Problem List Items Addressed This Visit     Elevated BP without diagnosis of hypertension   Generalized anxiety disorder with panic attacks   Relevant Medications   busPIRone  (BUSPAR ) 5 MG tablet   Other Visit Diagnoses       Severe episode of recurrent major depressive disorder, with psychotic features (HCC)    -  Primary   Relevant  Medications   busPIRone  (BUSPAR ) 5 MG tablet     BV (bacterial vaginosis)       Relevant Medications   clindamycin  (CLEOCIN ) 300 MG capsule       Major Depression recurrent, severe Generalized Anxiety Disorder Followed by Psychiatry  ED visit now recently 8/28 due to acutely elevated BP secondary to med Hypertension secondary to adverse effect of desvenlafaxine  Med now discontinued after ED 8/28 Blood pressure improved with amlodipine  treatment  Continue Abilify  and Buspirone .  - Continue amlodipine  5 mg daily. - Monitor for lower extremity edema. - Discuss alternative medications with psychiatrist on September 15th.  Atypical Chest wall pain ED visit ruled out other causes Chest wall pain likely non-cardiac, managed with naproxen . - Continue naproxen  as needed. - Discontinue naproxen  if pain resolves.  Bacterial vaginosis Recurrent bacterial vaginosis, clindamycin  preferred due to tolerability and past effectiveness. - Prescribe clindamycin  300 mg twice daily for 7 days with one refill. - Send prescription to [facility].        No orders of the defined types were placed in this encounter.   Meds ordered this encounter  Medications   clindamycin  (CLEOCIN ) 300 MG capsule    Sig: Take 1 capsule (300 mg total)  by mouth in the morning and at bedtime. For BV    Dispense:  14 capsule    Refill:  1    Follow up plan: Return if symptoms worsen or fail to improve.   Marsa Officer, DO [facility] 09/21/2023, 2:09 PM

## 2023-10-01 ENCOUNTER — Other Ambulatory Visit: Payer: MEDICAID

## 2023-10-01 ENCOUNTER — Ambulatory Visit: Payer: MEDICAID | Attending: Cardiology | Admitting: Cardiology

## 2023-10-01 ENCOUNTER — Encounter: Payer: Self-pay | Admitting: Cardiology

## 2023-10-01 VITALS — BP 130/88 | HR 82 | Ht 66.0 in | Wt 172.0 lb

## 2023-10-01 DIAGNOSIS — I10 Essential (primary) hypertension: Secondary | ICD-10-CM | POA: Insufficient documentation

## 2023-10-01 DIAGNOSIS — R0789 Other chest pain: Secondary | ICD-10-CM | POA: Insufficient documentation

## 2023-10-01 DIAGNOSIS — R0602 Shortness of breath: Secondary | ICD-10-CM | POA: Insufficient documentation

## 2023-10-01 NOTE — Progress Notes (Signed)
 Cardiology Office Note   Date:  10/01/2023  ID:  ALITHIA ZAVALETA, DOB 08-23-1985, MRN [id number] PCP: Edman Marsa PARAS, DO  [facility] Providers Cardiologist:  None     History of Present Illness Diamond Sutton is a 38 y.o. female with past medical history of generalized anxiety disorder with panic attacks, major depressive disorder, hypertension, who is here today after referral from her primary care provider for chest discomfort.  Patient was recently evaluated [facility] emergency department 09/16/2023 with complaints of chest pain.  She stated present for approximately 2 days but denied any associated symptoms of shortness of breath, cough, fever, chills.  Blood pressure was noted to be suboptimally controlled at 185/106, pulse 72, respirations 16, temperature 98.8.  Potassium was 2.9 with a blood glucose of 102.  Chest x-ray was without acute abnormality.  She was treated with Norvasc  5 mg daily Toradol  30 mg IM injection, and naproxen  500 mg.  High-sensitivity troponin was normal.  After medications were given symptoms started to improve.  She was to have close follow-up with her PCP and a cardiology referral for atypical chest discomfort.  She was evaluated by her PCP on 09/21/2023.  It was felt that her elevated blood pressure was secondary to adverse effect of venlafaxine .  Medication had been discontinued after emergency department visit on 8/28.  Blood pressure improved with amlodipine  treatment.  She was continued on Abilify  and buspirone .  She was encouraged to monitor for lower extremity edema.  In the emergency department they ruled out cardiac causes of her atypical chest wall.  She presents to clinic today stating that she had previously had episodes of chest pain but believes it was related to changes in her medications prescribed by her psychiatrist.  Blood pressure was extremely elevated at home when she was having episodes of chest discomfort.  She describes it as a  tight heavy sensation that causes associated shortness of breath.  She stopped medication and has only been taking her amlodipine  and her blood pressure has been better controlled.  She states that she has a history of hypertension dating back to when she was around 38 years of age.  She denies any other cardiac history.  States that she stopped smoking on 09/16/2023.  Family history of hypertension and diabetes and cancer.  No significant cardiovascular history.  ROS: 10 point review of systems has been reviewed and considered negative except ones been listed in the HPI  Studies Reviewed EKG Interpretation Date/Time:  Friday October 01 2023 08:19:39 EDT Ventricular Rate:  82 PR Interval:  182 QRS Duration:  84 QT Interval:  374 QTC Calculation: 436 R Axis:   54  Text Interpretation: Normal sinus rhythm Cannot rule out Anterior infarct (cited on or before 26-Dec-2018) When compared with ECG of 16-Sep-2023 13:52, No significant change was found Confirmed by Gerard Frederick ([id number]) on 10/01/2023 8:23:46 AM    Risk Assessment/Calculations           Physical Exam VS:  BP 130/88   Pulse 82   Ht 5' 6 (1.676 m)   Wt 172 lb (78 kg)   LMP 08/28/2023   SpO2 99%   BMI 27.76 kg/m        Wt Readings from Last 3 Encounters:  10/01/23 172 lb (78 kg)  09/21/23 169 lb (76.7 kg)  09/16/23 176 lb (79.8 kg)    GEN: Well nourished, well developed in no acute distress NECK: No JVD; No carotid bruits CARDIAC: RRR, I/VI systolic  murmur, without rubs, or gallops RESPIRATORY:  Clear to auscultation without rales, wheezing or rhonchi  ABDOMEN: Soft, non-tender, non-distended EXTREMITIES:  No edema; No deformity   ASSESSMENT AND PLAN Atypical chest pain with occasional associated shortness of breath.  She states she feels it was likely related to medication changes made by her psychiatrist and her blood pressure was extremely high and that is when she was experiencing the chest discomfort.  EKG today  reveals sinus rhythm with rate 82 with an old anterior infarct with no significant change from prior studies.  With her symptoms being resolved a longstanding history of hypertension she has been scheduled for an echocardiogram to rule out any structural or wall motion abnormalities.  Hypertension with a blood pressure today 130/88.  Blood pressure has been well-controlled on amlodipine  5 mg daily since she has stopped taking previously prescribed venlafaxine .  She has been encouraged to continue to monitor pressure 1 to 2 hours postmedication administration as well.       Dispo: Patient to return to clinic to see MD/APP in 3 months or sooner if needed for further evaluation after echocardiogram is been completed.  Signed, Melchizedek Espinola, NP

## 2023-10-01 NOTE — Patient Instructions (Signed)
 Medication Instructions:  Your physician recommends that you continue on your current medications as directed. Please refer to the Current Medication list given to you today.   *If you need a refill on your cardiac medications before your next appointment, please call your pharmacy*  Lab Work: No labs ordered today  If you have labs (blood work) drawn today and your tests are completely normal, you will receive your results only by: MyChart Message (if you have MyChart) OR A paper copy in the mail If you have any lab test that is abnormal or we need to change your treatment, we will call you to review the results.  Testing/Procedures: Your physician has requested that you have an echocardiogram. Echocardiography is a painless test that uses sound waves to create images of your heart. It provides your doctor with information about the size and shape of your heart and how well your heart's chambers and valves are working.   You may receive an ultrasound enhancing agent through an IV if needed to better visualize your heart during the echo. This procedure takes approximately one hour.  There are no restrictions for this procedure.  This will take place at [address] [facility] [address] [facility], [place] [address]  Please note: We ask at that you not bring children with you during ultrasound (echo/ vascular) testing. Due to room size and safety concerns, children are not allowed in the ultrasound rooms during exams. Our front office staff cannot provide observation of children in our lobby area while testing is being conducted. An adult accompanying a patient to their appointment will only be allowed in the ultrasound room at the discretion of the ultrasound technician under special circumstances. We apologize for any inconvenience.   Follow-Up: At [facility], you and your health needs are our priority.  As part of our continuing mission to provide you with exceptional heart  care, our providers are all part of one team.  This team includes your primary Cardiologist (physician) and Advanced Practice Providers or APPs (Physician Assistants and Nurse Practitioners) who all work together to provide you with the care you need, when you need it.  Your next appointment:   3 month(s)  Provider:   You may see one of the following Advanced Practice Providers on your designated Care Team:   Lonni Meager, NP Lesley Maffucci, PA-C Bernardino Bring, PA-C Cadence Wiseman, PA-C Tylene Lunch, NP Barnie Hila, NP

## 2023-10-14 ENCOUNTER — Ambulatory Visit: Payer: MEDICAID

## 2023-11-03 ENCOUNTER — Other Ambulatory Visit: Payer: Self-pay | Admitting: Medical Genetics

## 2023-11-03 DIAGNOSIS — Z006 Encounter for examination for normal comparison and control in clinical research program: Secondary | ICD-10-CM

## 2023-11-04 ENCOUNTER — Ambulatory Visit: Payer: MEDICAID

## 2023-11-05 ENCOUNTER — Ambulatory Visit: Payer: MEDICAID

## 2023-11-05 ENCOUNTER — Ambulatory Visit
Admission: RE | Admit: 2023-11-05 | Discharge: 2023-11-05 | Disposition: A | Discharge status: Home / Self Care | Payer: MEDICAID

## 2023-11-05 VITALS — BP 131/79 | HR 89 | Temp 98.0°F | Resp 18

## 2023-11-05 DIAGNOSIS — R1024 Suprapubic pain: Secondary | ICD-10-CM | POA: Diagnosis present

## 2023-11-05 DIAGNOSIS — R300 Dysuria: Secondary | ICD-10-CM | POA: Diagnosis present

## 2023-11-05 DIAGNOSIS — N898 Other specified noninflammatory disorders of vagina: Secondary | ICD-10-CM | POA: Diagnosis present

## 2023-11-05 DIAGNOSIS — Z113 Encounter for screening for infections with a predominantly sexual mode of transmission: Secondary | ICD-10-CM | POA: Insufficient documentation

## 2023-11-05 LAB — POCT URINE DIPSTICK
Bilirubin, UA: NEGATIVE
Glucose, UA: NEGATIVE mg/dL
Ketones, POC UA: NEGATIVE mg/dL
Leukocytes, UA: NEGATIVE
Nitrite, UA: NEGATIVE
POC PROTEIN,UA: NEGATIVE
Spec Grav, UA: 1.025 (ref 1.010–1.025)
Urobilinogen, UA: 0.2 U/dL
pH, UA: 6 (ref 5.0–8.0)

## 2023-11-05 NOTE — Discharge Instructions (Addendum)
 Follow up with your primary care provider tomorrow.  Go to the emergency department if you have worsening symptoms.    Your urine does not show signs of infection.  Your vaginal tests are pending.  Do not have any sexual activity until all test results are back and treatment is completed if needed.
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
## 2023-11-05 NOTE — ED Triage Notes (Signed)
 Patient to Urgent Care with complaints of intermittent, suprapubic pain. Also complaints of vaginal discharge.  Symptoms x2 weeks.   Requests STD testing.

## 2023-11-08 LAB — CERVICOVAGINAL ANCILLARY ONLY
Bacterial Vaginitis (gardnerella): NEGATIVE
Candida Glabrata: NEGATIVE
Candida Vaginitis: POSITIVE — AB
Chlamydia: NEGATIVE
Comment: NEGATIVE
Comment: NEGATIVE
Comment: NEGATIVE
Comment: NEGATIVE
Comment: NEGATIVE
Comment: NORMAL
Neisseria Gonorrhea: NEGATIVE
Trichomonas: NEGATIVE

## 2023-11-09 ENCOUNTER — Ambulatory Visit (HOSPITAL_COMMUNITY): Payer: Self-pay

## 2023-11-09 ENCOUNTER — Ambulatory Visit: Payer: MEDICAID

## 2023-11-09 MED ORDER — FLUCONAZOLE 150 MG PO TABS: 150.0000 mg | ORAL_TABLET | Freq: Once | ORAL | 0 refills | Status: AC

## 2023-12-07 ENCOUNTER — Telehealth: Payer: MEDICAID | Admitting: Physician Assistant

## 2023-12-07 DIAGNOSIS — B3731 Acute candidiasis of vulva and vagina: Secondary | ICD-10-CM

## 2023-12-07 MED ORDER — FLUCONAZOLE 150 MG PO TABS: ORAL_TABLET | ORAL | 0 refills | Status: DC

## 2023-12-07 NOTE — Progress Notes (Signed)

## 2023-12-09 ENCOUNTER — Other Ambulatory Visit: Payer: Self-pay | Admitting: Family Medicine

## 2023-12-09 ENCOUNTER — Encounter: Payer: Self-pay | Admitting: Family Medicine

## 2023-12-09 ENCOUNTER — Telehealth: Payer: MEDICAID | Admitting: Physician Assistant

## 2023-12-09 DIAGNOSIS — B9689 Other specified bacterial agents as the cause of diseases classified elsewhere: Secondary | ICD-10-CM

## 2023-12-09 DIAGNOSIS — B3731 Acute candidiasis of vulva and vagina: Secondary | ICD-10-CM

## 2023-12-09 MED ORDER — CLINDAMYCIN PHOSPHATE 2 % VA CREA: 1.0000 | TOPICAL_CREAM | Freq: Every day | VAGINAL | 0 refills | Status: DC

## 2023-12-09 MED ORDER — FLUCONAZOLE 150 MG PO TABS: ORAL_TABLET | ORAL | 0 refills | Status: DC

## 2023-12-09 NOTE — Progress Notes (Signed)
 We are sorry that you are not feeling well. Here is how we plan to help! Based on what you shared with me it looks like you: May have a vaginosis due to bacteria  Vaginosis is an inflammation of the vagina that can result in discharge, itching and pain. The cause is usually a change in the normal balance of vaginal bacteria or an infection. Vaginosis can also result from reduced estrogen levels after menopause.  The most common causes of vaginosis are:   Bacterial vaginosis which results from an overgrowth of one on several organisms that are normally present in your vagina.   Yeast infections which are caused by a naturally occurring fungus called candida.   Vaginal atrophy (atrophic vaginosis) which results from the thinning of the vagina from reduced estrogen levels after menopause.   Trichomoniasis which is caused by a parasite and is commonly transmitted by sexual intercourse.  Factors that increase your risk of developing vaginosis include: Medications, such as antibiotics and steroids Uncontrolled diabetes Use of hygiene products such as bubble bath, vaginal spray or vaginal deodorant Douching Wearing damp or tight-fitting clothing Using an intrauterine device (IUD) for birth control Hormonal changes, such as those associated with pregnancy, birth control pills or menopause Sexual activity Having a sexually transmitted infection  Your treatment plan is Clindamycin  vaginal cream 5 grams applied vaginally for 7 days.  I have electronically sent this prescription into the pharmacy that you have chosen.  Be sure to take all of the medication as directed. Stop taking any medication if you develop a rash, tongue swelling or shortness of breath. Mothers who are breast feeding should consider pumping and discarding their breast milk while on these antibiotics. However, there is no consensus that infant exposure at these doses would be harmful.  Remember that medication creams can weaken  latex condoms.   HOME CARE:  Good hygiene may prevent some types of vaginosis from recurring and may relieve some symptoms:  Avoid baths, hot tubs and whirlpool spas. Rinse soap from your outer genital area after a shower, and dry the area well to prevent irritation. Don't use scented or harsh soaps, such as those with deodorant or antibacterial action. Avoid irritants. These include scented tampons and pads. Wipe from front to back after using the toilet. Doing so avoids spreading fecal bacteria to your vagina.  Other things that may help prevent vaginosis include:  Don't douche. Your vagina doesn't require cleansing other than normal bathing. Repetitive douching disrupts the normal organisms that reside in the vagina and can actually increase your risk of vaginal infection. Douching won't clear up a vaginal infection. Use a latex condom. Both female and female latex condoms may help you avoid infections spread by sexual contact. Wear cotton underwear. Also wear pantyhose with a cotton crotch. If you feel comfortable without it, skip wearing underwear to bed. Yeast thrives in [employer] Your symptoms should improve in the next day or two.  GET HELP RIGHT AWAY IF:  You have pain in your lower abdomen ( pelvic area or over your ovaries) You develop nausea or vomiting You develop a fever Your discharge changes or worsens You have persistent pain with intercourse You develop shortness of breath, a rapid pulse, or you faint.  These symptoms could be signs of problems or infections that need to be evaluated by a medical provider now.  MAKE SURE YOU   Understand these instructions. Will watch your condition. Will get help right away if you are not doing  well or get worse.  Your e-visit answers were reviewed by a board certified advanced clinical practitioner to complete your personal care plan. Depending upon the condition, your plan could have included both over the counter or  prescription medications. Please review your pharmacy choice to make sure that you have choses a pharmacy that is open for you to pick up any needed prescription, Your safety is important to us . If you have drug allergies check your prescription carefully.   You can use MyChart to ask questions about today's visit, request a non-urgent call back, or ask for a work or school excuse for 24 hours related to this e-Visit. If it has been greater than 24 hours you will need to follow up with your provider, or enter a new e-Visit to address those concerns. You will get a MyChart message within the next two days asking about your experience. I hope that your e-visit has been valuable and will speed your recovery.  I have spent 5 minutes in review of e-visit questionnaire, review and updating patient chart, medical decision making and response to patient.   Elsie Velma Lunger, PA-C

## 2023-12-09 NOTE — Addendum Note (Signed)
 Addended byBETHA ROLAN BERTHOLD on: 12/09/2023 10:24 AM   Modules accepted: Orders

## 2023-12-09 NOTE — Progress Notes (Signed)
 Prescription sent

## 2023-12-10 ENCOUNTER — Telehealth: Payer: MEDICAID | Admitting: Emergency Medicine

## 2023-12-10 DIAGNOSIS — B9689 Other specified bacterial agents as the cause of diseases classified elsewhere: Secondary | ICD-10-CM

## 2023-12-10 MED ORDER — CLINDAMYCIN HCL 300 MG PO CAPS: 300.0000 mg | ORAL_CAPSULE | Freq: Two times a day (BID) | ORAL | 0 refills | Status: AC

## 2023-12-10 NOTE — Telephone Encounter (Signed)
 Requested Prescriptions  Refused Prescriptions Disp Refills   clindamycin  (CLEOCIN ) 300 MG capsule [Pharmacy Med Name: CLINDAMYCIN  HCL 300 MG CAPSULE] 14 capsule 1    Sig: TAKE 1 CAPSULE (300 MG TOTAL) BY MOUTH IN THE MORNING AND AT BEDTIME. FOR BV     Off-Protocol Failed - 12/10/2023  5:43 PM      Failed - Medication not assigned to a protocol, review manually.      Passed - Valid encounter within last 12 months    Recent Outpatient Visits           2 months ago Severe episode of recurrent major depressive disorder, with psychotic features [facility])   [facility] [address] [place], Marsa PARAS, DO   4 months ago Annual physical exam   [facility] [place] Marsa PARAS, DO       Future Appointments             In 3 weeks Gerard Frederick, NP [facility] [place]

## 2023-12-10 NOTE — Progress Notes (Signed)
 Patient just needing medication changed due to availability at pharmacy.

## 2023-12-14 ENCOUNTER — Ambulatory Visit: Payer: MEDICAID | Admitting: Family Medicine

## 2023-12-15 ENCOUNTER — Ambulatory Visit: Payer: MEDICAID | Admitting: Family Medicine

## 2023-12-22 ENCOUNTER — Emergency Department: Payer: Self-pay

## 2023-12-22 ENCOUNTER — Other Ambulatory Visit: Payer: Self-pay

## 2023-12-22 ENCOUNTER — Emergency Department
Admission: EM | Admit: 2023-12-22 | Discharge: 2023-12-22 | Disposition: A | Discharge status: Home / Self Care | Payer: Self-pay | Attending: Emergency Medicine | Admitting: Emergency Medicine

## 2023-12-22 ENCOUNTER — Ambulatory Visit: Payer: Self-pay

## 2023-12-22 DIAGNOSIS — I10 Essential (primary) hypertension: Secondary | ICD-10-CM | POA: Insufficient documentation

## 2023-12-22 DIAGNOSIS — E876 Hypokalemia: Secondary | ICD-10-CM | POA: Insufficient documentation

## 2023-12-22 DIAGNOSIS — R1030 Lower abdominal pain, unspecified: Secondary | ICD-10-CM | POA: Insufficient documentation

## 2023-12-22 LAB — WET PREP, GENITAL
Clue Cells Wet Prep HPF POC: NONE SEEN
Sperm: NONE SEEN
Trich, Wet Prep: NONE SEEN
WBC, Wet Prep HPF POC: >=10 — AB (ref <10)
Yeast Wet Prep HPF POC: NONE SEEN

## 2023-12-22 LAB — CBC
HCT: 38.3 % (ref 36.0–46.0)
Hemoglobin: 12.7 g/dL (ref 12.0–15.0)
MCH: 29.1 pg (ref 26.0–34.0)
MCHC: 33.2 g/dL (ref 30.0–36.0)
MCV: 87.8 fL (ref 80.0–100.0)
Platelets: 390 K/uL (ref 150–400)
RBC: 4.36 MIL/uL (ref 3.87–5.11)
RDW: 13.9 % (ref 11.5–15.5)
WBC: 9 K/uL (ref 4.0–10.5)
nRBC: 0 % (ref 0.0–0.2)

## 2023-12-22 LAB — CHLAMYDIA/NGC RT PCR (ARMC ONLY)
Chlamydia Tr: NOT DETECTED
N gonorrhoeae: NOT DETECTED

## 2023-12-22 LAB — URINALYSIS, ROUTINE W REFLEX MICROSCOPIC
Bilirubin Urine: NEGATIVE
Glucose, UA: NEGATIVE mg/dL
Hgb urine dipstick: NEGATIVE
Ketones, ur: NEGATIVE mg/dL
Leukocytes,Ua: NEGATIVE
Nitrite: NEGATIVE
Protein, ur: NEGATIVE mg/dL
Specific Gravity, Urine: 1.018 (ref 1.005–1.030)
pH: 6 (ref 5.0–8.0)

## 2023-12-22 LAB — HIV ANTIBODY (ROUTINE TESTING W REFLEX): HIV Screen 4th Generation wRfx: NONREACTIVE

## 2023-12-22 LAB — COMPREHENSIVE METABOLIC PANEL WITH GFR
ALT: 16 U/L (ref 0–44)
AST: 17 U/L (ref 15–41)
Albumin: 4.5 g/dL (ref 3.5–5.0)
Alkaline Phosphatase: 79 U/L (ref 38–126)
Anion gap: 11 (ref 5–15)
BUN: 8 mg/dL (ref 6–20)
CO2: 24 mmol/L (ref 22–32)
Calcium: 9.1 mg/dL (ref 8.9–10.3)
Chloride: 104 mmol/L (ref 98–111)
Creatinine, Ser: 0.63 mg/dL (ref 0.44–1.00)
GFR, Estimated: >60 mL/min (ref >60)
Glucose, Bld: 114 mg/dL — ABNORMAL HIGH (ref 70–99)
Potassium: 3.4 mmol/L — ABNORMAL LOW (ref 3.5–5.1)
Sodium: 139 mmol/L (ref 135–145)
Total Bilirubin: 0.4 mg/dL (ref 0.0–1.2)
Total Protein: 7.7 g/dL (ref 6.5–8.1)

## 2023-12-22 LAB — LIPASE, BLOOD: Lipase: 28 U/L (ref 11–51)

## 2023-12-22 LAB — PREGNANCY, URINE: Preg Test, Ur: NEGATIVE

## 2023-12-22 MED ORDER — IBUPROFEN 800 MG PO TABS
800.0000 mg | ORAL_TABLET | Freq: Once | ORAL | Status: AC
  Administered 2023-12-22: 800 mg via ORAL
  Filled 2023-12-22: qty 1

## 2023-12-22 MED ORDER — METRONIDAZOLE 1 % EX GEL: Freq: Every day | CUTANEOUS | 0 refills | Status: DC

## 2023-12-22 NOTE — Telephone Encounter (Signed)
 FYI Only or Action Required?: FYI only for provider: ED advised.  Patient was last seen in primary care on 09/21/2023 by Diamond Sutton PARAS, DO.  Called Nurse Triage reporting Abdominal Pain.  Symptoms began several days ago.  Interventions attempted: Nothing.  Symptoms are: gradually worsening.  Triage Disposition: Go to ED Now (Notify PCP)  Patient/caregiver understands and will follow disposition?: Yes      Reason for Disposition  [1] SEVERE pain (e.g., excruciating) AND [2] present > 1 hour  Answer Assessment - Initial Assessment Questions Advised ED now.  Patient reports will have someone to drive.  Advised nothing to eat or drink.  Advised call 911 if symptoms worsen, patient verbalized understanding.  1. LOCATION: Where does it hurt?       below belly button,centered moves all over;  entire lower 3. ONSET: When did the pain begin? (e.g., minutes, hours or days ago)      1 week ago, got worse 2 days ago 6. SEVERITY: How bad is the pain?  (e.g., Scale 1-10; mild, moderate, or severe)     8/10; lasting longer than 1 hour, constant Sweating and chills  Protocols used: Abdominal Pain - Female-A-AH Message from [place] sent at 12/22/2023  8:26 AM EST  Summary: abdominal pain   Reason for Triage: Patient states she is having abdominal pain traveling to her back, it is causing her sweats and chills.  Requesting appointment, [phone]

## 2023-12-22 NOTE — Discharge Instructions (Addendum)
 Your evaluated in the ED for lower abdominal pain.  Your ED workup is reassuring.  Lab work is normal.  Urinalysis is normal.  Negative STD and wet prep workup.  Please follow-up with your primary care provider for further evaluation.  If any new or worsening symptoms occur please return for further evaluation.  Pain control:  Ibuprofen  (motrin /aleve /advil ) - You can take 3 tablets (600 mg) every 6 hours as needed for pain/fever.  Acetaminophen  (tylenol ) - You can take 2 extra strength tablets (1000 mg) every 6 hours as needed for pain/fever.  You can alternate these medications or take them together.  Make sure you eat food/drink water when taking these medications.

## 2023-12-22 NOTE — ED Triage Notes (Signed)
 Pt to ED for lower abd pain radiating to lower back x1 week. Denies n/v/d. +increased urinary frequency.

## 2023-12-22 NOTE — ED Provider Notes (Signed)
 [facility] Provider Note    Event Date/Time   First MD Initiated Contact with Patient 12/22/23 0930     (approximate)   History   Abdominal Pain   HPI  Diamond Sutton is a 38 y.o. female  with PMH of depression, ovarian cyst, HTN who presents for evaluation of lower abdominal pain that radiates to her back. Patient endorses urinary frequency and yellowish vaginal discharge. Denies nausea, vomiting, diarrhea, constipation, fever.       Physical Exam   Triage Vital Signs: ED Triage Vitals  Encounter Vitals Group     BP 12/22/23 0916 (!) 156/94     Girls Systolic BP Percentile --      Girls Diastolic BP Percentile --      Boys Systolic BP Percentile --      Boys Diastolic BP Percentile --      Pulse Rate 12/22/23 0916 85     Resp 12/22/23 0916 18     Temp 12/22/23 0916 98.9 F (37.2 C)     Temp Source 12/22/23 1323 Oral     SpO2 12/22/23 0916 100 %     Weight 12/22/23 0917 161 lb (73 kg)     Height 12/22/23 0917 5' 5 (1.651 m)     Head Circumference --      Peak Flow --      Pain Score 12/22/23 0917 7     Pain Loc --      Pain Education --      Exclude from Growth Chart --     Most recent vital signs: Vitals:   12/22/23 0930 12/22/23 1323  BP:  (!) 128/92  Pulse:  66  Resp:  16  Temp:  98.2 F (36.8 C)  SpO2: 100% 100%    General: Awake, no distress.  CV:  Good peripheral perfusion. RRR. Resp:  Normal effort. CTAB. Abd:  No distention. Soft, TTP suprapubically. Other:  Normal appearing labias, small amount of white discharge in vaginal vault, normal appearing cervix, no cervical motion tenderness, mild discomfort on bimanual exam in left adnexa.   ED Results / Procedures / Treatments   Labs (all labs ordered are listed, but only abnormal results are displayed) Labs Reviewed  WET PREP, GENITAL - Abnormal; Notable for the following components:      Result Value   WBC, Wet Prep HPF POC >=10 (*)    All other components  within normal limits  COMPREHENSIVE METABOLIC PANEL WITH GFR - Abnormal; Notable for the following components:   Potassium 3.4 (*)    Glucose, Bld 114 (*)    All other components within normal limits  URINALYSIS, ROUTINE W REFLEX MICROSCOPIC - Abnormal; Notable for the following components:   Color, Urine YELLOW (*)    APPearance HAZY (*)    All other components within normal limits  CHLAMYDIA/NGC RT PCR ([facility] ONLY)            LIPASE, BLOOD  CBC  HIV ANTIBODY (ROUTINE TESTING W REFLEX)  PREGNANCY, URINE  SYPHILIS: RPR W/REFLEX TO RPR TITER AND TREPONEMAL ANTIBODIES, TRADITIONAL SCREENING AND DIAGNOSIS ALGORITHM     RADIOLOGY  Pelvic ultrasound ordered.  PROCEDURES:  Critical Care performed: No  Procedures   MEDICATIONS ORDERED IN ED: Medications  ibuprofen  (ADVIL ) tablet 800 mg (800 mg Oral Given 12/22/23 1052)     IMPRESSION / MDM / ASSESSMENT AND PLAN / ED COURSE  I reviewed the triage vital signs and the nursing notes.  38 year old female presents for evaluation of lower abdominal pain.  Blood pressure was elevated a little bit, vital signs stable otherwise.  Patient NAD on exam.  Differential diagnosis includes, but is not limited to, UTI, nephrolithiasis, pyelonephritis, STD, PID, ovarian cyst, ovarian torsion, constipation, muscle strain.  Patient's presentation is most consistent with acute complicated illness / injury requiring diagnostic workup.  Will obtain basic labs, urinalysis and STD testing.  Will plan to obtain pelvic ultrasound as patient's pain is lower in the abdomen and she had some discomfort on bimanual exam.  If negative could consider abdominal CT after this.  Clinical Course as of 12/22/23 1534  Wed Dec 22, 2023  0944 CBC Unremarkable. [LD]  1531 Comprehensive metabolic panel(!) Unremarkable. [LD]  1533 Lipase, blood Normal limits. [LD]  1533 Wet prep, genital(!) Negative. [LD]  1533 HIV Antibody (routine  testing w rflx) Negative. [LD]  1533 Chlamydia/NGC rt PCR ([facility] only) Negative. [LD]  1533 Pregnancy, urine Negative. [LD]  1533 Urinalysis, Routine w reflex microscopic -Urine, Clean Catch(!) Negative. [LD]  1534 Workup thus far is overall reassuring.  Care of the patient will be passed off to the oncoming provider who will monitor for ultrasound results and will make appropriate disposition. [LD]    Clinical Course User Index [LD] Cleaster Tinnie LABOR, PA-C     FINAL CLINICAL IMPRESSION(S) / ED DIAGNOSES   Final diagnoses:  Lower abdominal pain     Rx / DC Orders   ED Discharge Orders     None        Note:  This document was prepared using Dragon voice recognition software and may include unintentional dictation errors.   Cleaster Tinnie LABOR, PA-C 12/22/23 1534    Viviann Pastor, MD 12/25/23 2321

## 2023-12-22 NOTE — ED Provider Notes (Signed)
-----------------------------------------   3:21 PM on 12/22/2023 -----------------------------------------  Blood pressure (!) 128/92, pulse 66, temperature 98.2 F (36.8 C), temperature source Oral, resp. rate 16, height 5' 5 (1.651 m), weight 73 kg, SpO2 100%.  Assuming care from [place], [place].  In short, Diamond Sutton is a 38 y.o. female with a chief complaint of lower abdominal pain.  Refer to the original H&P for additional details.  The current plan of care is to await ultrasound results.  ----------------------------------------- 4:27 PM on 12/22/2023 -----------------------------------------  Ultrasound results updated to patient.  Patient was offered CT abdomen and pelvis in which she declined as she states she has been in the emergency room longer than she would like.  We discussed strict ED return precautions in which she verbalized understanding such as worsening abdominal pain, vaginal bleeding, fever and vomiting.  Follow-up with her primary care provider in 1 week.  She reports symptoms are similar to when she has BV.  She is requesting for cream for this treatment.  Will prescribe MetroGel .  Patient stable condition for discharge home at this time.   Clinical Course as of 12/22/23 1626  Wed Dec 22, 2023  0944 CBC Unremarkable. [LD]  1531 Comprehensive metabolic panel(!) Unremarkable. [LD]  1533 Lipase, blood Normal limits. [LD]  1533 Wet prep, genital(!) Negative. [LD]  1533 HIV Antibody (routine testing w rflx) Negative. [LD]  1533 Chlamydia/NGC rt PCR ([facility] only) Negative. [LD]  1533 Pregnancy, urine Negative. [LD]  1533 Urinalysis, Routine w reflex microscopic -Urine, Clean Catch(!) Negative. [LD]  1534 Workup thus far is overall reassuring.  Care of the patient will be passed off to the oncoming provider who will monitor for ultrasound results and will make appropriate disposition. [LD]    Clinical Course User Index [LD] Cleaster Tinnie LABOR, PA-C      Medications  ibuprofen  (ADVIL ) tablet 800 mg (800 mg Oral Given 12/22/23 1052)     ED Discharge Orders     None      Final diagnoses:  None      Diamond Rebbeca LABOR, PA-C 12/22/23 1628    Diamond Ozell LABOR, MD 12/22/23 2333

## 2023-12-23 ENCOUNTER — Ambulatory Visit: Payer: MEDICAID | Attending: Cardiology

## 2023-12-23 DIAGNOSIS — R0789 Other chest pain: Secondary | ICD-10-CM

## 2023-12-23 DIAGNOSIS — I10 Essential (primary) hypertension: Secondary | ICD-10-CM

## 2023-12-23 LAB — ECHOCARDIOGRAM COMPLETE
AR max vel: 2.74 cm2
AV Area VTI: 2.85 cm2
AV Area mean vel: 2.77 cm2
AV Mean grad: 7 mmHg
AV Peak grad: 13 mmHg
Ao pk vel: 1.8 m/s
Area-P 1/2: 4.6 cm2
S' Lateral: 2.88 cm

## 2023-12-23 LAB — SYPHILIS: RPR W/REFLEX TO RPR TITER AND TREPONEMAL ANTIBODIES, TRADITIONAL SCREENING AND DIAGNOSIS ALGORITHM: RPR Ser Ql: NONREACTIVE

## 2023-12-24 ENCOUNTER — Ambulatory Visit: Payer: Self-pay | Admitting: Cardiology

## 2023-12-24 NOTE — Telephone Encounter (Signed)
 Called patient to review echo results - all questions answered

## 2023-12-31 ENCOUNTER — Inpatient Hospital Stay: Payer: Self-pay | Admitting: Family Medicine

## 2024-01-04 ENCOUNTER — Ambulatory Visit: Payer: MEDICAID | Attending: Cardiology | Admitting: Cardiology

## 2024-01-04 ENCOUNTER — Encounter: Payer: Self-pay | Admitting: Cardiology

## 2024-01-04 VITALS — BP 120/82 | HR 76 | Ht 66.0 in | Wt 175.0 lb

## 2024-01-04 DIAGNOSIS — I10 Essential (primary) hypertension: Secondary | ICD-10-CM

## 2024-01-04 DIAGNOSIS — R0789 Other chest pain: Secondary | ICD-10-CM

## 2024-01-04 DIAGNOSIS — B9689 Other specified bacterial agents as the cause of diseases classified elsewhere: Secondary | ICD-10-CM

## 2024-01-04 DIAGNOSIS — R0602 Shortness of breath: Secondary | ICD-10-CM

## 2024-01-04 MED ORDER — NAPROXEN 500 MG PO TABS: 500.0000 mg | ORAL_TABLET | Freq: Two times a day (BID) | ORAL | 2 refills | Status: AC

## 2024-01-04 NOTE — Patient Instructions (Signed)
 Medication Instructions:  Your physician recommends that you continue on your current medications as directed. Please refer to the Current Medication list given to you today.   *If you need a refill on your cardiac medications before your next appointment, please call your pharmacy*  Lab Work: No labs ordered today  If you have labs (blood work) drawn today and your tests are completely normal, you will receive your results only by: MyChart Message (if you have MyChart) OR A paper copy in the mail If you have any lab test that is abnormal or we need to change your treatment, we will call you to review the results.  Testing/Procedures: No test ordered today   Follow-Up: At [facility], you and your health needs are our priority.  As part of our continuing mission to provide you with exceptional heart care, our providers are all part of one team.  This team includes your primary Cardiologist (physician) and Advanced Practice Providers or APPs (Physician Assistants and Nurse Practitioners) who all work together to provide you with the care you need, when you need it.  Your next appointment:   3 month(s)  Provider:   Ronald Cockayne, NP

## 2024-01-04 NOTE — Progress Notes (Unsigned)
 Cardiology Office Note   Date:  01/05/2024  ID:  Diamond Sutton, DOB [birth year], MRN [id number] PCP: [place] Marsa PARAS, DO  [facility] Providers Cardiologist:  None Cardiology APP:  Gerard Frederick, NP     History of Present Illness Diamond Sutton is a 38 y.o. female with a past medical history of generalized anxiety disorder with panic attacks, major depressive disorder, hypertension, atypical chest pain, is here today for follow-up.   Patient was recently evaluated [facility] emergency department 09/16/2023 with complaints of chest pain.  She stated present for approximately 2 days but denied any associated symptoms of shortness of breath, cough, fever, chills.  Blood pressure was noted to be suboptimally controlled at 185/106, pulse 72, respirations 16, temperature 98.8.  Potassium was 2.9 with a blood glucose of 102.  Chest x-ray was without acute abnormality.  She was treated with Norvasc  5 mg daily Toradol  30 mg IM injection, and naproxen  500 mg.  High-sensitivity troponin was normal.  After medications were given symptoms started to improve.  She was to have close follow-up with her PCP and a cardiology referral for atypical chest discomfort.   She was evaluated by her PCP on 09/21/2023.  It was felt that her elevated blood pressure was secondary to adverse effect of venlafaxine .  Medication had been discontinued after emergency department visit on 8/28.  Blood pressure improved with amlodipine  treatment.  She was continued on Abilify  and buspirone .  She was encouraged to monitor for lower extremity edema.  In the emergency department they ruled out cardiac causes of her atypical chest wall.  She was seen in clinic 10/01/2023 stating she had previously had episodes of chest pain but believes it was related to change in her medications prescribed by her psychiatrist.  She was scheduled for an echocardiogram to rule out any structural or functional abnormalities.   She returns to  clinic today stating that overall she has been doing fairly well.  She continues to have some reproducible musculoskeletal like chest discomfort around the left clavicle.  She had previously been prescribed naproxen  for a few days and is requesting a refill because she stated that resolved the pain when she was taking it.  She denies any other associated symptoms.  Is previously on antibiotics after recent multiple emergency department visits with complaints of abdominal pain and was found to have ovarian cysts.  States that she has been compliant with her medication regimen and denies any undue side effects.  ROS: 10 point review of system has been reviewed and considered negative with exception was been listed in the HPI  Studies Reviewed EKG Interpretation Date/Time:  Tuesday January 04 2024 15:20:14 EST Ventricular Rate:  76 PR Interval:  200 QRS Duration:  86 QT Interval:  394 QTC Calculation: 443 R Axis:   60  Text Interpretation: Normal sinus rhythm Cannot rule out Anterior infarct (cited on or before 26-Dec-2018) When compared with ECG of 01-Oct-2023 08:19, No significant change was found Confirmed by Gerard Frederick ([id number]) on 01/04/2024 3:33:20 PM    2d echo 12/23/2023 1. Left ventricular ejection fraction, by estimation, is 55 to 60%. The  left ventricle has normal function. Left ventricular endocardial border  not optimally defined to evaluate regional wall motion. Indeterminate  diastolic filling due to E-A fusion.   2. Right ventricular systolic function is normal. The right ventricular  size is normal.   3. The mitral valve is normal in structure. Trivial mitral valve  regurgitation. No evidence of mitral  stenosis.   4. The aortic valve has an indeterminant number of cusps. Aortic valve  regurgitation is mild. No aortic stenosis is present.   5. The inferior vena cava is normal in size with greater than 50%  respiratory variability, suggesting right atrial pressure of 3  mmHg.   Risk Assessment/Calculations           Physical Exam VS:  BP 120/82 (BP Location: Left Arm, Patient Position: Sitting, Cuff Size: Normal)   Pulse 76   Ht 5' 6 (1.676 m)   Wt 175 lb (79.4 kg)   SpO2 99%   BMI 28.25 kg/m        Wt Readings from Last 3 Encounters:  01/04/24 175 lb (79.4 kg)  12/22/23 161 lb (73 kg)  10/01/23 172 lb (78 kg)    GEN: Well nourished, well developed in no acute distress NECK: No JVD; No carotid bruits CARDIAC: RRR, no murmurs, rubs, gallops RESPIRATORY:  Clear to auscultation without rales, wheezing or rhonchi  ABDOMEN: Soft, non-tender, non-distended EXTREMITIES:  No edema; No deformity   ASSESSMENT AND PLAN Atypical chest pain that is reproducible with mild palpation to the left clavicle.  She has been taking naproxen  with total resolution.  EKG today reveals sinus rhythm with a rate of 76 with no acute ischemic changes.  Echocardiogram completed revealed LVEF 55-60% with normal function.  There were no valvular abnormalities noted.  Patient believes that the discomfort that she was experiencing was related to medication changes that were made by her psychiatrist that caused elevated blood pressures.  No further ischemic evaluation needed at this time.  Hypertension with a blood pressure today of 120/82.  She is continued on amlodipine  5 mg daily.  Blood pressure is well-controlled.  She has been encouraged to continue to monitor pressures 1 to 2 hours postmedication administration as well.  Anxiety with panic attacks where she has been on several different medication continues to be managed by psychiatry.  Tobacco abuse with total cessation recommended.       Dispo: Patient to return to clinic to see MD/APP in 3 months or sooner if needed for further evaluation.  Signed, Troi Bechtold, NP

## 2024-02-22 ENCOUNTER — Ambulatory Visit: Payer: Self-pay

## 2024-02-24 ENCOUNTER — Inpatient Hospital Stay: Admission: RE | Admit: 2024-02-24 | Discharge: 2024-02-24 | Payer: Self-pay

## 2024-02-24 ENCOUNTER — Ambulatory Visit: Payer: Self-pay

## 2024-02-24 VITALS — BP 134/89 | HR 70 | Temp 98.4°F | Resp 18

## 2024-02-24 DIAGNOSIS — N898 Other specified noninflammatory disorders of vagina: Secondary | ICD-10-CM

## 2024-02-24 MED ORDER — FLUCONAZOLE 150 MG PO TABS: 150.0000 mg | ORAL_TABLET | ORAL | 0 refills | Status: AC | PRN

## 2024-02-24 NOTE — ED Triage Notes (Signed)
 Patient reports vaginal irritation inside and outside. Patient here for complete STD check no blood work.

## 2024-02-24 NOTE — Discharge Instructions (Addendum)
 Today you are being treated prophylactically for yeast.   Take diflucan 150 mg once, if symptoms still present in 3 days then you may take second pill   Yeast infections which are caused by a naturally occurring fungus called candida. Vaginosis is an inflammation of the vagina that can result in discharge, itching and pain. The cause is usually a change in the normal balance of vaginal bacteria or an infection. Vaginosis can also result from reduced estrogen levels after menopause.  Labs pending 2-3 days, you will be contacted if positive for any sti and treatment will be sent to the pharmacy, you will have to return to the clinic if positive for gonorrhea to receive treatment   Please refrain from having sex until labs results, if positive please refrain from having sex until treatment complete and symptoms resolve   If positive for  Chlamydia  gonorrhea or trichomoniasis please notify partner or partners so they may tested as well  it is recommended you use some form of protection against the transmission of sti infections  such as condoms or dental dams with each sexual encounter    In addition:   Avoid baths, hot tubs and whirlpool spas.  Don't use scented or harsh soaps, such as those with deodorant or antibacterial action. Avoid irritants. These include scented tampons and pads. Wipe from front to back after using the toilet.  Don't douche. Your vagina doesn't require cleansing other than normal bathing.  Use a  condom. Wear cotton underwear, this fabric helps absorb moisture
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
## 2024-02-25 LAB — CERVICOVAGINAL ANCILLARY ONLY
Bacterial Vaginitis (gardnerella): NEGATIVE
Candida Glabrata: NEGATIVE
Candida Vaginitis: NEGATIVE
Chlamydia: NEGATIVE
Comment: NEGATIVE
Comment: NEGATIVE
Comment: NEGATIVE
Comment: NEGATIVE
Comment: NEGATIVE
Comment: NORMAL
Neisseria Gonorrhea: NEGATIVE
Trichomonas: NEGATIVE

## 2024-04-12 ENCOUNTER — Ambulatory Visit: Payer: Self-pay | Admitting: Cardiology
